# Patient Record
Sex: Female | Born: 1960 | Hispanic: Yes | Marital: Married | Smoking: Never smoker
Health system: Southern US, Community
[De-identification: ages and names within clinical notes are randomized; demographics above are authoritative.]

## PROBLEM LIST (undated history)

## (undated) DIAGNOSIS — C169 Malignant neoplasm of stomach, unspecified: Secondary | ICD-10-CM

## (undated) DIAGNOSIS — I2699 Other pulmonary embolism without acute cor pulmonale: Secondary | ICD-10-CM

## (undated) DIAGNOSIS — K5900 Constipation, unspecified: Secondary | ICD-10-CM

## (undated) DIAGNOSIS — C801 Malignant (primary) neoplasm, unspecified: Secondary | ICD-10-CM

## (undated) HISTORY — DX: Malignant (primary) neoplasm, unspecified: C80.1

---

## 2013-10-18 HISTORY — PX: ABDOMINAL HYSTERECTOMY: SHX81

## 2014-05-02 ENCOUNTER — Other Ambulatory Visit: Payer: Self-pay

## 2014-05-02 ENCOUNTER — Ambulatory Visit: Payer: Self-pay | Attending: Internal Medicine

## 2014-05-02 MED ORDER — ENOXAPARIN SODIUM 100 MG/ML ~~LOC~~ SOLN: 100.0000 mg | Freq: Two times a day (BID) | SUBCUTANEOUS | Status: DC

## 2014-05-02 NOTE — Progress Notes (Unsigned)
Patient recently moved here from [place] Does not have a primary doctor but is currently taking  Lovenox. Pharmacy spoke with Dr Annitta Needs and approved a one Month supply for patient.  Patient does have an appointment here to  Establish care.  [facility]

## 2014-05-06 ENCOUNTER — Ambulatory Visit: Payer: Self-pay | Attending: Internal Medicine

## 2014-05-07 ENCOUNTER — Other Ambulatory Visit: Payer: Self-pay

## 2014-05-07 MED ORDER — ENOXAPARIN SODIUM 100 MG/ML ~~LOC~~ SOLN: 100.0000 mg | Freq: Two times a day (BID) | SUBCUTANEOUS | Status: DC

## 2014-05-15 ENCOUNTER — Other Ambulatory Visit: Payer: Self-pay

## 2014-05-17 ENCOUNTER — Ambulatory Visit: Payer: Medicaid Other | Attending: Family Medicine | Admitting: Family Medicine

## 2014-05-17 ENCOUNTER — Encounter: Payer: Self-pay | Admitting: Family Medicine

## 2014-05-17 VITALS — BP 146/96 | HR 74 | Temp 98.4°F | Resp 18 | Ht 61.0 in | Wt 192.0 lb

## 2014-05-17 DIAGNOSIS — Z90710 Acquired absence of both cervix and uterus: Secondary | ICD-10-CM | POA: Diagnosis not present

## 2014-05-17 DIAGNOSIS — C179 Malignant neoplasm of small intestine, unspecified: Secondary | ICD-10-CM | POA: Diagnosis present

## 2014-05-17 DIAGNOSIS — F329 Major depressive disorder, single episode, unspecified: Secondary | ICD-10-CM | POA: Diagnosis not present

## 2014-05-17 DIAGNOSIS — C569 Malignant neoplasm of unspecified ovary: Secondary | ICD-10-CM | POA: Insufficient documentation

## 2014-05-17 DIAGNOSIS — I2699 Other pulmonary embolism without acute cor pulmonale: Secondary | ICD-10-CM

## 2014-05-17 DIAGNOSIS — Z9221 Personal history of antineoplastic chemotherapy: Secondary | ICD-10-CM | POA: Insufficient documentation

## 2014-05-17 DIAGNOSIS — Z86711 Personal history of pulmonary embolism: Secondary | ICD-10-CM | POA: Insufficient documentation

## 2014-05-17 DIAGNOSIS — R918 Other nonspecific abnormal finding of lung field: Secondary | ICD-10-CM

## 2014-05-17 DIAGNOSIS — Z7901 Long term (current) use of anticoagulants: Secondary | ICD-10-CM | POA: Diagnosis not present

## 2014-05-17 DIAGNOSIS — F32A Depression, unspecified: Secondary | ICD-10-CM | POA: Insufficient documentation

## 2014-05-17 HISTORY — DX: Other pulmonary embolism without acute cor pulmonale: I26.99

## 2014-05-17 LAB — COMPLETE METABOLIC PANEL WITH GFR
ALK PHOS: 147 U/L — AB (ref 39–117)
ALT: 15 U/L (ref 0–35)
AST: 20 U/L (ref 0–37)
Albumin: 4.3 g/dL (ref 3.5–5.2)
BILIRUBIN TOTAL: 0.7 mg/dL (ref 0.2–1.2)
BUN: 9 mg/dL (ref 6–23)
CO2: 26 mEq/L (ref 19–32)
Calcium: 9.2 mg/dL (ref 8.4–10.5)
Chloride: 104 mEq/L (ref 96–112)
Creat: 0.61 mg/dL (ref 0.50–1.10)
GFR, Est African American: >89 mL/min
GLUCOSE: 89 mg/dL (ref 70–99)
Potassium: 4.1 mEq/L (ref 3.5–5.3)
Sodium: 139 mEq/L (ref 135–145)
Total Protein: 7.2 g/dL (ref 6.0–8.3)

## 2014-05-17 LAB — CBC WITH DIFFERENTIAL/PLATELET
Basophils Absolute: 0 10*3/uL (ref 0.0–0.1)
Basophils Relative: 1 % (ref 0–1)
EOS ABS: 0 10*3/uL (ref 0.0–0.7)
Eosinophils Relative: 1 % (ref 0–5)
HCT: 32.7 % — ABNORMAL LOW (ref 36.0–46.0)
Hemoglobin: 10.5 g/dL — ABNORMAL LOW (ref 12.0–15.0)
Lymphocytes Relative: 41 % (ref 12–46)
Lymphs Abs: 1.5 10*3/uL (ref 0.7–4.0)
MCH: 30.8 pg (ref 26.0–34.0)
MCHC: 32.1 g/dL (ref 30.0–36.0)
MCV: 95.9 fL (ref 78.0–100.0)
MONOS PCT: 22 % — AB (ref 3–12)
Monocytes Absolute: 0.8 10*3/uL (ref 0.1–1.0)
NEUTROS PCT: 35 % — AB (ref 43–77)
Neutro Abs: 1.3 10*3/uL — ABNORMAL LOW (ref 1.7–7.7)
PLATELETS: 250 10*3/uL (ref 150–400)
RBC: 3.41 MIL/uL — ABNORMAL LOW (ref 3.87–5.11)
RDW: 17.9 % — AB (ref 11.5–15.5)
WBC: 3.7 10*3/uL — ABNORMAL LOW (ref 4.0–10.5)

## 2014-05-17 MED ORDER — RIVAROXABAN 20 MG PO TABS: 20.0000 mg | ORAL_TABLET | Freq: Every day | ORAL | Status: DC

## 2014-05-17 MED ORDER — SERTRALINE HCL 100 MG PO TABS: 100.0000 mg | ORAL_TABLET | Freq: Every day | ORAL | Status: DC

## 2014-05-17 MED ORDER — RIVAROXABAN 15 MG PO TABS: 15.0000 mg | ORAL_TABLET | Freq: Two times a day (BID) | ORAL | Status: DC

## 2014-05-17 NOTE — Assessment & Plan Note (Signed)
A: related to recent cancer diagnosis and treatment P: Continue zoloft

## 2014-05-17 NOTE — Assessment & Plan Note (Signed)
A: s/p chemotherapy. Medically stable.  P: Patient signed a release of medical information form so all records could be obtained Referral to oncology placed

## 2014-05-17 NOTE — Progress Notes (Signed)
   Subjective:    Patient ID: Alicia [name], female    DOB: 1960/09/23, 53 y.o.   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 She is s/p total abdominal hysterectomy and 12 sessions of chemotherapy with FOLFOX. She request referral to oncology to establish care. She comes today with some of her records (op report, pathology, first few f/u with her oncologist in [place] Dr. Raelyn Mora).   #2 PE: she was diagnosed with bilateral pulmonary emboli from a CT chest obtained after her last FOLFOX session. She has been giving herself lovenox injections twice daily since 04/29/14. She was suppose to have f/u with her oncologist to discuss transitioning to oral therapy but moved down to [place] before this appointment was completed. She admits occasional CP and SOB. No syncope.   #3 depression: related to recent cancer diagnosis and treatment. On zoloft. Tolerating the medication. Mood reported as fair.   Soc hx: non smoker  Review of Systems As per HPI  Intermittent severe headaches, she takes dilaudid sparingly, HAs were bad during chemo.    Objective:   Physical Exam BP 146/96  Pulse 74  Temp(Src) 98.4 F (36.9 C) (Oral)  Resp 18  Ht 5\' 1"  (1.549 m)  Wt 192 lb (87.091 kg)  BMI 36.30 kg/m2  SpO2 99% General appearance: alert, cooperative and no distress Lungs: clear to auscultation bilaterally Heart: regular rate and rhythm, S1, S2 normal, no murmur, click, rub or gallop Extremities: extremities normal, atraumatic, no cyanosis or edema     Assessment & Plan:

## 2014-05-17 NOTE — Assessment & Plan Note (Signed)
A: per history, patient on lovenox.  P: Transition off lovenox to xarelto 15 mg BID x 3 weeks, then 20 mg daily. Review records Check CMP and CBC Likely obtain f/u CTA

## 2014-05-17 NOTE — Patient Instructions (Addendum)
Senora Linde Pope,  Gracias por venido hoy.  I was very nice to meet you   1. Stop loveonx 2. Start xarelto 15 mg twice daily with food for 3 weeks, followed by 20 mg daily with food.  3. Refilled zoloft 4. I placed a referral to oncology  5. Sign release of medical information form so I can get records from Dr. Jacques Navy.   F/u in 4-6 weeks with me   Dr. Adrian Blackwater

## 2014-05-17 NOTE — Progress Notes (Signed)
Establish Care Stated has Hx intestine and stomach Cancer  Need oncology referral, Medicine Refill enoxaparin

## 2014-05-20 ENCOUNTER — Telehealth: Payer: Self-pay | Admitting: *Deleted

## 2014-05-20 NOTE — Telephone Encounter (Signed)
Message copied by Betti Cruz on Mon May 20, 2014  5:35 PM ------      Message from: Boykin Nearing      Created: Mon May 20, 2014 12:05 PM       Normocytic anemia.      Normal CMP except slightly elevated alk phos      No f/u labs at this time ------

## 2014-05-20 NOTE — Telephone Encounter (Signed)
Left message with normal lab results, call back for more information

## 2014-05-22 ENCOUNTER — Telehealth: Payer: Self-pay | Admitting: Emergency Medicine

## 2014-05-22 ENCOUNTER — Encounter (HOSPITAL_COMMUNITY): Payer: Self-pay | Admitting: Emergency Medicine

## 2014-05-22 ENCOUNTER — Telehealth: Payer: Self-pay | Admitting: Family Medicine

## 2014-05-22 ENCOUNTER — Ambulatory Visit: Payer: Self-pay | Attending: Internal Medicine

## 2014-05-22 ENCOUNTER — Emergency Department (INDEPENDENT_AMBULATORY_CARE_PROVIDER_SITE_OTHER)
Admission: EM | Admit: 2014-05-22 | Discharge: 2014-05-22 | Disposition: A | Discharge status: Home / Self Care | Payer: Self-pay | Source: Home / Self Care | Attending: Family Medicine | Admitting: Family Medicine

## 2014-05-22 DIAGNOSIS — T887XXA Unspecified adverse effect of drug or medicament, initial encounter: Secondary | ICD-10-CM

## 2014-05-22 DIAGNOSIS — T50905A Adverse effect of unspecified drugs, medicaments and biological substances, initial encounter: Secondary | ICD-10-CM

## 2014-05-22 DIAGNOSIS — I2699 Other pulmonary embolism without acute cor pulmonale: Secondary | ICD-10-CM

## 2014-05-22 MED ORDER — ENOXAPARIN SODIUM 40 MG/0.4ML ~~LOC~~ SOLN: 90.0000 mg | Freq: Two times a day (BID) | SUBCUTANEOUS | Status: DC

## 2014-05-22 NOTE — Discharge Instructions (Signed)
Pare de tomar Xarelto y comienze de nuevo Lovenox. Por favor counsulte con su doctora lo mas pronto posible. Gracias.

## 2014-05-22 NOTE — Telephone Encounter (Signed)
Pt brother Garlon Hatchet called in for pt with medication reaction due to [employer] pt is experiencing numbness in arms and feet with heart palpitations,vomitting. Instructed brother to take pt to Urgent Care or Er for evaluation/treatment and to stop medication now Message will be sent to Dr. Adrian Blackwater for further medication change

## 2014-05-22 NOTE — ED Notes (Signed)
Pt reports s/e to Xarelto; started to take them last Thursday Sx include: palpitations, anxiety, vomiting, HA Alert, no signs of acute distress.

## 2014-05-22 NOTE — ED Provider Notes (Signed)
Alicia [name] is a 53 y.o. female who presents to Urgent Care today for side effects to medication. Patient was switched from Lovenox to Xarelto 6 days ago. Since then she's had palpitations vomiting headache and fatigue. She was doing very well on Lovenox. She did not take Xarelto today and feels a bit better. No current fevers chills chest pain or palpitations. She takes anticoagulation due to history of PE with malignancy.   Past Medical History  Diagnosis Date  . Cancer Dx Mar 5,2015   History  Substance Use Topics  . Smoking status: Never Smoker   . Smokeless tobacco: Never Used  . Alcohol Use: No   ROS as above Medications: No current facility-administered medications for this encounter.   Current Outpatient Prescriptions  Medication Sig Dispense Refill  . HYDROmorphone (DILAUDID) 4 MG tablet Take by mouth every 4 (four) hours as needed for severe pain.      . pantoprazole (PROTONIX) 40 MG tablet Take 40 mg by mouth daily.      . prochlorperazine (COMPAZINE) 10 MG tablet Take 10 mg by mouth every 6 (six) hours as needed for nausea or vomiting.      . sertraline (ZOLOFT) 100 MG tablet Take 1 tablet (100 mg total) by mouth daily.  30 tablet  2  . [DISCONTINUED] rivaroxaban (XARELTO) 20 MG TABS tablet Take 1 tablet (20 mg total) by mouth daily with supper.  30 tablet  1  . enoxaparin (LOVENOX) 40 MG/0.4ML injection Inject 0.9 mLs (90 mg total) into the skin every 12 (twelve) hours.  60 Syringe  0    Exam:  BP 163/103  Pulse 82  Temp(Src) 98.3 F (36.8 C) (Oral)  Resp 18  SpO2 97% Gen: Well NAD HEENT: EOMI,  MMM Lungs: Normal work of breathing. CTABL Heart: RRR no MRG Abd: NABS, Soft. Nondistended, Nontender Exts: Brisk capillary refill, warm and well perfused.   No results found for this or any previous visit (from the past 24 hour(s)). No results found.  Assessment and Plan: 53 y.o. female with side effect and a possible allergy to Xarelto. Switch to Lovenox.  Followup with PCP. Prescription for Lovenox provided. 1mg /kg bid  Discussed warning signs or symptoms. Please see discharge instructions. Patient expresses understanding.     Gregor Hams, MD 05/22/14 2056

## 2014-05-22 NOTE — Telephone Encounter (Signed)
Patient has come in today to inform PCP that she is having a negative reaction to script Rivaroxaban (XARELTO) 15 MG TABS tablet; Patient states she feels like her heart is beating too fast accompanied with headaches, numbness in hands/feet and vomitting

## 2014-05-23 ENCOUNTER — Ambulatory Visit: Payer: Medicaid Other | Attending: Family Medicine | Admitting: Family Medicine

## 2014-05-23 ENCOUNTER — Encounter: Payer: Self-pay | Admitting: Family Medicine

## 2014-05-23 VITALS — BP 135/90 | HR 83 | Temp 98.2°F | Resp 18 | Ht 61.0 in | Wt 196.0 lb

## 2014-05-23 DIAGNOSIS — I2699 Other pulmonary embolism without acute cor pulmonale: Secondary | ICD-10-CM

## 2014-05-23 DIAGNOSIS — Z09 Encounter for follow-up examination after completed treatment for conditions other than malignant neoplasm: Secondary | ICD-10-CM | POA: Diagnosis not present

## 2014-05-23 DIAGNOSIS — C801 Malignant (primary) neoplasm, unspecified: Secondary | ICD-10-CM | POA: Diagnosis not present

## 2014-05-23 DIAGNOSIS — Z7901 Long term (current) use of anticoagulants: Secondary | ICD-10-CM

## 2014-05-23 DIAGNOSIS — T50995D Adverse effect of other drugs, medicaments and biological substances, subsequent encounter: Secondary | ICD-10-CM | POA: Insufficient documentation

## 2014-05-23 DIAGNOSIS — C179 Malignant neoplasm of small intestine, unspecified: Secondary | ICD-10-CM

## 2014-05-23 DIAGNOSIS — Z5181 Encounter for therapeutic drug level monitoring: Secondary | ICD-10-CM

## 2014-05-23 LAB — POCT INR: INR: 1

## 2014-05-23 MED ORDER — WARFARIN SODIUM 2 MG PO TABS: 6.0000 mg | ORAL_TABLET | Freq: Every day | ORAL | Status: DC

## 2014-05-23 MED ORDER — ENOXAPARIN SODIUM 100 MG/ML ~~LOC~~ SOLN: 1.0000 mg/kg | Freq: Two times a day (BID) | SUBCUTANEOUS | Status: DC

## 2014-05-23 NOTE — Progress Notes (Signed)
   Subjective:    Patient ID: Alicia [name], female    DOB: 1960/09/14, 53 y.o.   MRN: [id number] CC: f/u xarelto SE  HPI 53 yo F presents for f/u visit:  1. xarelto SE: had nause, emesis, HA, numbness in fingers and toes while taking xarelto. Stopped xarelto. Went to urgent care. Restarted lovenox injections. No longer having symptoms. Of note patient also take SSRI. No bleeding or bruising. On xarelto for PE in the setting of malignancy.  Soc hx: non smoker  Review of Systems As per HPI     Objective:   Physical Exam BP 135/90  Pulse 83  Temp(Src) 98.2 F (36.8 C) (Oral)  Resp 18  Ht 5\' 1"  (1.549 m)  Wt 196 lb (88.905 kg)  BMI 37.05 kg/m2  SpO2 98% General appearance: alert, cooperative and no distress Head: Normocephalic, without obvious abnormality, atraumatic Lungs: clear to auscultation bilaterally Heart: regular rate and rhythm, S1, S2 normal, no murmur, click, rub or gallop Extremities: extremities normal, atraumatic, no cyanosis or edema       Assessment & Plan:

## 2014-05-23 NOTE — Patient Instructions (Addendum)
Alicia [name],  Thank you for coming in today. No more xarelto.  Start coumadin 6 mg (3 tablets) at 6 PM.  Continue lovenox 9 AM and 9 PM while transitioning to coumadin.  You will take lovenox until your INR is 2-3 for 48 hrs at least.  Today your INR is 1.0.   Return on Monday 05/27/14 for coumadin clinic.    Dr. Colette Ribas: lo que debe saber (Warfarin: What You Need to Know) La warfarina es un anticoagulante. Los anticoagulantes ayudan a prevenir la formacin de cogulos de [place]. Tambin ayudan a Recruitment consultant de cogulos de [place]. A veces, se dice que la warfarina es un "anticoagulante".  Normalmente, cuando hay un corte o lesin en los tejidos, los cogulos impiden la prdida de [place]. A veces, se forman cogulos dentro de los vasos sanguneos que obstruyen el flujo sanguneo a travs del sistema circulatorio (trombosis). Estos cogulos pueden viajar por el torrente sanguneo y se alojan en los vasos sanguneos pequeos del cerebro, lo que puede causar un ictus, o en los pulmones (embolia pulmonar). Alicia [name] DEBEN UTILIZAR WARFARINA La warfarina se receta en aquellos pacientes que tienen riesgo de desarrollar cogulos sanguneos peligrosos:  Las personas que tienen una vlvula cardaca mecnica, un ritmo cardaco irregular llamado fibrilacin auricular o ciertos trastornos de la coagulacin.  Las personas que tuvieron cogulos sanguneos peligrosos en el pasado, incluidas las que tuvieron un ictus, una embolia pulmonar o trombosis en las piernas (trombosis venosa profunda [TVP]).  Las personas con un cogulo sanguneo existente, como una embolia pulmonar. DOSIS DE [place] comprimidos de warfarina vienen en diferentes concentraciones. Cada uno de ellos es de diferente color y tiene la cantidad de warfarina (en miligramos) claramente impresa en el comprimido. Si cuando le dan una nueva receta el color del comprimido es diferente al habitual, infrmelo  inmediatamente a su farmacutico o mdico. SUPERVISIN DE LA WARFARINA El objetivo del tratamiento con warfarina es disminuir la formacin de cogulos, pero no impedir completamente la coagulacin. Su mdico supervisar atentamente el efecto anticoagulante de la warfarina y ajustar la dosis segn sea necesario. Por su seguridad, se usan anlisis de sangre llamados tiempo de protrombina (PT) o ndice internacional normalizado (INR). Ambos anlisis pueden hacerse con un pinchazo en el dedo o con una extraccin de [place]. Cuanto ms tiempo necesite la sangre para Designer, fashion/clothing, mayor ser el nivel de [place] o de INR. Su mdico le informar cul es su rango de PT o INR "deseado". Si en cualquier momento su PT o INR estn por encima del rango deseado, existe riesgo de hemorragia. Si el nivel de PT o INR es menor que el rango deseado, existe riesgo de formar cogulos. Ya sea que comience a recibir Dealer est hospitalizado o en el consultorio de su mdico, deber controlarse el PT o INR dentro de la primera semana de Academic librarian. Inicialmente, a Psychologist, clinical se les indica que se controlen el PT o INR [employer] veces por semana. Una vez que se haya encontrado la dosis de mantenimiento, Lobbyist PT o INR con menos frecuencia, generalmente una vez cada dos a cuatro semanas. La dosis de warfarina podra ajustarse si el PT o INR no estn dentro del rango deseado. Es importante que cumpla con los controles de laboratorio y las consultas mdicas que le indiquen. No cumplir con las visitas puede resultar en una lesin, dolor o discapacidad crnica o permanente debido a que la warfarina es un medicamento que requiere Furniture conservator/restorer  estrecha vigilancia. CULES SON LOS EFECTOS ADVERSOS DE LA [place]?  Mucha cantidad de warfarina puede producir sangrado (hemorragia) en cualquier parte del cuerpo. Podra ser sangrado de las encas, sangre en la [place], materia fecal con sangre u oscura, hemorragia nasal que  no frena fcilmente, escupir sangre al toser o vomitar con [employer].  Muy poca cantidad de warfarina puede aumentar el riesgo de cogulos sanguneos.  Muy poca o mucha warfarina tambin puede aumentar el riesgo de ictus.  La warfarina puede causar una erupcin cutnea o irritacin, fiebre inusual, nuseas o Higher education careers adviser constantes o dolor intenso en las articulaciones o la espalda. PRECAUCIONES ESPECIALES MIENTRAS SE EST TOMANDO WARFARINA La warfarina debe ser tomada exactamente como se le indic. Es muy importante que reciba la warfarina segn las indicaciones, ya que podra tener tanto una hemorragia como cogulos sanguneos que terminen produciendo una lesin, Social research officer, government o discapacidad crnica o [place].  [employer] a la misma hora. Si olvida tomar una dosis, puede tomarla si an no pasaron 6horas del momento correspondiente.  No modifique la dosis de warfarina por sus propios medios para compensar la dosis que ha omitido.  Si omite ms de dos dosis consecutivas, comunquese con su mdico para que le d indicaciones. Evite las situaciones que le puedan producir hemorragias. Puede tener tendencia a sangrar ms fcilmente que lo habitual mientras toma warfarina. Las siguientes recomendaciones pueden limitar el sangrado:  Utilice un cepillo de dientes blando.  Utilice un hilo dental encerado.  Rasrese con Geographical information systems officer y no con hoja de Public relations account executive.  Limite el uso de objetos afilados.  Evite las actividades potencialmente peligrosas, como los deportes de [place]. Warfarina y Media planner o amamantamiento  No se aconseja el uso de warfarina durante Software engineer trimestre del embarazo debido al aumento de riesgo para el beb de tener defectos congnitos. En ciertas situaciones, una mujer puede tomar warfarina despus del primer trimestre del embarazo. Una mujer que queda embarazada o planifica estarlo mientras toma warfarina debe informarlo al mdico  inmediatamente.  Aunque la warfarina no pasa hacia la [place], una mujer que desea Counselling psychologist est tomando warfarina debe consultarlo con su mdico. Consumo de alcohol, tabaquismo y drogas ilegales   El alcohol afecta la forma en que la warfarina funciona en el organismo. Es Museum/gallery conservator las bebidas alcohlicas o consumir muy pequeas cantidades cuando se est en tratamiento con warfarina. En general, debe limitar el consumo de alcohol a 1onza (40JW) de licor, 6onzas (119JY) de vino o 12onzas (341ml) de cerveza por da. Hgale saber a su mdico si modifica su consumo de alcohol.  El tabaquismo afecta el funcionamiento de la warfarina. Lo ideal es evitar fumar cuando se est en tratamiento con warfarina. Hgale saber a su mdico si modifica su hbito de tabaquismo.  Lo ideal es evitar todas las drogas ilegales cuando se est en tratamiento con warfarina porque existen pocos estudios que muestren cmo interacta con estas drogas. Otros medicamentos y suplementos nutricionales Muchos medicamentos recetados y de venta [place] pueden interferir con la warfarina. Asegrese de informar a todos sus mdicos que est tomando warfarina. Notifique al mdico que le recet la warfarina o al farmacutico antes de comenzar o interrumpir cualquier otro medicamento, incluso las vitaminas, los suplementos dietarios y los medicamentos para el dolor de [place]. Es posible que deba ajustar la dosis de warfarina. Algunos medicamentos de [employer] pueden aumentar el riesgo de sangrado mientras est tomando warfarina son los siguientes:  Paracetamol.  Aspirina.  Antiinflamatorios no esteroides (AINE), como ibuprofeno o naproxeno.  Vitamina E. Consideraciones nutricionales Los alimentos con cantidades moderadas o altas de vitaminaK interfieren con la warfarina. Evite cambios importantes en su dieta, o hable con su mdico antes de cambiarla. Consuma una cantidad consistente de alimentos  con un nivel moderado o alto de vitaminaK. Consumir menos alimentos con vitaminaK puede aumentar el riesgo de [place]. Consumir ms de [employer] puede aumentar el riesgo de cogulos sanguneos. Consulte a un nutricionista si tiene otras preguntas sobre las consideraciones en la dieta. Alimentos que tienen un nivel muy alto de vitaminaK:  Los vegetales, como la acelga suiza y Music therapist, y las hojas de [place], [place] o nabo (frescos o congelados, cocidos).  Col rizada (fresca o congelada, cocida).  Perejil (crudo).  Espinaca (cocida). Alimentos que tienen un nivel alto de vitaminaK:  Investment banker, operational (frescos, cocidos).  Frijoles verdes (frescos, cocidos).  Brcoli.  Repollo chino (cocido).  Repollitos de [place] (frescos o congelados, cocidos).  Repollo (cocido).   Col. Alimentos que tienen un nivel moderado de vitaminaK:  Arndanos.  Frijoles carita.  Endivia (cruda).  Valeda Malm de hoja verde (cruda).  Cebollines verdes (crudos).  Col rizada (cruda).  Quimbomb (congelado, cocido).  Pltanos (fritos).  Priscille Loveless (cruda).  Chucrut (en lata).  Espinaca (cruda). LLAME A SU CLNICA O MDICO SI USTED:  Planifica someterse a una ciruga o un procedimiento.  Se siente enfermo, especialmente si tiene diarrea o vmitos.  Hace o planifica hacer cambios importantes en la dieta.  Comienza a tomar o interrumpe medicamentos recetados o de venta libre.  Thyra Breed, planifica quedar o cree estarlo.  Tiene perodos menstruales ms abundantes.  Sufre una cada, un accidente o tiene sntomas de sangrado o hematomas inusuales.  Tiene fiebre inusual. LLAME AL 911 EN LOS ESTADOS UNIDOS O CONCURRA AL SERVICIO DE EMERGENCIAS SI USTED:   Cree que tiene una reaccin alrgica a la warfarina. Los signos de [place] reaccin alrgica pueden ser picazn, erupcin, ronchas, hinchazn, opresin en el pecho o problemas para respirar.  Observa sangre en la orina. Los  signos pueden ser orina de color t, rojizo o rosado.  Observa sangre en las heces. Los signos pueden ser heces negras o rojo brillante.  Vomita o tose con sangre. En estos casos, la sangre podra ser de color rojo brillante o tener la apariencia de "caf molido".  Tiene sangrado que no se interrumpe despus de aplicar presin durante 70minutos, por ejemplo cortes, sangrado de la nariz u otras lesiones.  Tiene dolor intenso en las articulaciones o la espalda.  Tiene dolor de cabeza intenso.  Siente debilidad o adormecimiento sbito de la cara, el brazo o la pierna, especialmente en un lado del cuerpo.  Siente confusin o problemas para comprender sbitamente.  Tiene dificultad sbita en la visin de uno o ambos ojos.  Tiene problemas para caminar, mareos, prdida del equilibrio o de la coordinacin sbitamente.  Tiene dificultad para hablar o comprender (afasia). Document Released: 11/17/2006 Document Revised: 12/17/2013 [facility] Patient Information 2015 [place]. This information is not intended to replace advice given to you by your health care provider. Make sure you discuss any questions you have with your health care provider.

## 2014-05-23 NOTE — Assessment & Plan Note (Signed)
A: did not tolerate xarelto. P:  Start coumadin 6 mg (3 tablets) at 6 PM.  Continue lovenox 1mg /kg BID 9 AM and 9 PM while transitioning to coumadin.  You will take lovenox until your INR is 2-3 for 48 hrs at least.  Today your INR is 1.0.   Return on Monday 05/27/14 for coumadin clinic.

## 2014-05-23 NOTE — Progress Notes (Signed)
Pt complaining of medicine reaction, Xarelto,  vomiting, hand and finger numb.leg pain Pt stated din not have any reaction to the Injection

## 2014-05-27 ENCOUNTER — Ambulatory Visit: Payer: Medicaid Other | Attending: Family Medicine | Admitting: Pharmacist

## 2014-05-27 VITALS — BP 118/80 | HR 70 | Temp 97.8°F | Resp 16

## 2014-05-27 DIAGNOSIS — I8290 Acute embolism and thrombosis of unspecified vein: Secondary | ICD-10-CM | POA: Diagnosis not present

## 2014-05-27 DIAGNOSIS — Z7901 Long term (current) use of anticoagulants: Secondary | ICD-10-CM | POA: Insufficient documentation

## 2014-05-27 LAB — POCT INR: INR: 1.3

## 2014-05-27 NOTE — Progress Notes (Signed)
Patient is here for recheck on her INR Today INR is 1.3 Per Dr Adrian Blackwater  Increase coumadin to 8mg  and repeat INR this friday

## 2014-05-30 ENCOUNTER — Telehealth: Payer: Self-pay | Admitting: Oncology

## 2014-05-30 NOTE — Telephone Encounter (Signed)
S/W PATIENT FRIEND AND GAVE NP APPT FOR 10/22 @ 1:30 W/DR. MOHAMED.  REFERRING DR. Boykin Nearing DX- ADENOCARCINOMA OF SMALL BOWEL

## 2014-05-30 NOTE — Telephone Encounter (Signed)
C/D 05/30/14 for appt. 06/06/14

## 2014-05-31 ENCOUNTER — Ambulatory Visit: Payer: Self-pay | Attending: Family Medicine

## 2014-05-31 DIAGNOSIS — I8290 Acute embolism and thrombosis of unspecified vein: Secondary | ICD-10-CM

## 2014-05-31 LAB — POCT INR: INR: 2.8

## 2014-05-31 MED ORDER — WARFARIN SODIUM 4 MG PO TABS: 8.0000 mg | ORAL_TABLET | Freq: Every day | ORAL | Status: DC

## 2014-06-06 ENCOUNTER — Ambulatory Visit (HOSPITAL_BASED_OUTPATIENT_CLINIC_OR_DEPARTMENT_OTHER): Payer: Self-pay

## 2014-06-06 ENCOUNTER — Ambulatory Visit (HOSPITAL_BASED_OUTPATIENT_CLINIC_OR_DEPARTMENT_OTHER): Payer: Self-pay | Admitting: Oncology

## 2014-06-06 ENCOUNTER — Encounter: Payer: Self-pay | Admitting: Oncology

## 2014-06-06 ENCOUNTER — Ambulatory Visit: Payer: Self-pay

## 2014-06-06 VITALS — BP 135/96 | HR 84 | Temp 98.4°F | Resp 18 | Ht 60.0 in | Wt 194.7 lb

## 2014-06-06 DIAGNOSIS — C562 Malignant neoplasm of left ovary: Secondary | ICD-10-CM

## 2014-06-06 DIAGNOSIS — C5702 Malignant neoplasm of left fallopian tube: Secondary | ICD-10-CM

## 2014-06-06 DIAGNOSIS — I2699 Other pulmonary embolism without acute cor pulmonale: Secondary | ICD-10-CM

## 2014-06-06 DIAGNOSIS — C561 Malignant neoplasm of right ovary: Secondary | ICD-10-CM

## 2014-06-06 DIAGNOSIS — C166 Malignant neoplasm of greater curvature of stomach, unspecified: Secondary | ICD-10-CM

## 2014-06-06 DIAGNOSIS — C5701 Malignant neoplasm of right fallopian tube: Secondary | ICD-10-CM

## 2014-06-06 DIAGNOSIS — Z23 Encounter for immunization: Secondary | ICD-10-CM

## 2014-06-06 DIAGNOSIS — G629 Polyneuropathy, unspecified: Secondary | ICD-10-CM

## 2014-06-06 DIAGNOSIS — C169 Malignant neoplasm of stomach, unspecified: Secondary | ICD-10-CM

## 2014-06-06 HISTORY — DX: Malignant neoplasm of stomach, unspecified: C16.9

## 2014-06-06 MED ORDER — HEPARIN SOD (PORK) LOCK FLUSH 100 UNIT/ML IV SOLN
500.0000 [IU] | Freq: Once | INTRAVENOUS | Status: AC
  Administered 2014-06-06: 500 [IU] via INTRAVENOUS
  Filled 2014-06-06: qty 5

## 2014-06-06 MED ORDER — SODIUM CHLORIDE 0.9 % IJ SOLN
10.0000 mL | INTRAMUSCULAR | Status: DC | PRN
  Administered 2014-06-06: 10 mL via INTRAVENOUS
  Filled 2014-06-06: qty 10

## 2014-06-06 MED ORDER — INFLUENZA VAC SPLIT QUAD 0.5 ML IM SUSY
0.5000 mL | PREFILLED_SYRINGE | Freq: Once | INTRAMUSCULAR | Status: AC
  Administered 2014-06-06: 0.5 mL via INTRAMUSCULAR

## 2014-06-06 NOTE — Progress Notes (Signed)
Checked in new pt no insurance.  Pt was approved for 100% discount thru [facility] until 11/04/14.  Forward to [facility] for follow up.

## 2014-06-06 NOTE — Progress Notes (Signed)
[facility] Patient Consult   Referring MD: Minerva Ends, Md [address] [place], [place] [address]   Debrah Granderson 53 y.o.  [birth year]    Reason for Referral: Gastric cancer   HPI: (She was with a Spanish interpreter today) She presented with abdominal pain and underwent surgery 10/18/2013. She was found to have ovarian cancer in the initial surgery included a total abdominal hysterectomy, resection of metastatic tumor involving the anterior abdominal wall, bilateral pelvic lymphadenectomy, omentectomy, and small bowel resection. Tumor was noted to involve the greater curvature of the stomach and there was a tumor implant at the rectal surface. The pathology confirmed moderate to poorly differentiated adenocarcinoma involving both ovaries and fallopian tubes. An omental mass was involved by adenocarcinoma. A right pelvic nodule contained poorly different it adenocarcinoma. The segment of small bowel was involved with moderate to poorly differentiated adenocarcinoma as was tumor at the umbilicus. Immunohistochemical stains family tumor cells to be positive for pancytokeratin,CDX2, cytokeratin 7, and CA 19-9. The tumor cells were negative for CA 125, gross cystic disease fluid protein, TTF-1, PAX8, cytokeratin 20, and inhibin.   She was referred to gastroenterology to look for a primary tumor source. A stomach biopsy 11/28/2013 revealed adenocarcinoma (we do not have the endoscopy report available today)  She saw Dr.Kula at [facility] She was started on treatment with FOLFOX chemotherapy. She has completed 12 cycles of FOLFOX to date. The most recent cycle was initiated 04/25/2014. She reports tolerating the chemotherapy well other than the neuropathy symptoms. She reports the neuropathy started after cycle 10 and is now interfering with activities. There is numbness in the hands and feet.  A restaging CT 02/04/2014 revealed a decrease  in the size of a few lymph nodes in the upper abdomen.. At the midline anterior, wall there was a slight increase in ill-defined densities felt to represent scarring versus neoplasm. Otherwise no new mass or adenopathy. A restaging CT 04/29/2014 confirmed new pulmonary emboli, a stable gastric mass with adjacent lymph nodes. No new lymphadenopathy. A faint areas of groundglass nodularity in the lungs was felt to be nonspecific.  Ms. Stenseth has relocated to [facility]. She was maintained on Lovenox anticoagulation for the pulmonary emboli and is now taking Coumadin.   Past Medical History  Diagnosis Date  .  metastatic gastric cancer  Dx Mar 5,2015     .  G3 P3    .  Pulmonary emboli on a chest CT 04/29/2014  Past Surgical History  Procedure Laterality Date  . Abdominal hysterectomy, bilateral oophorectomy, bilateral pelvic lymphadenectomy, omentectomy, resection of tumor at the anterior wall, pelvis, and periumbilical region   11/15/3242     .   Pilonidal cyst surgery  Medications: Reviewed  Allergies:  Allergies  Allergen Reactions  . Xarelto [Rivaroxaban] Anaphylaxis, Nausea And Vomiting and Palpitations    Headache and fatigue     Family history: She has 6 siblings. No family history of cancer.  Social History:   She lives with her husband and daughter in [place]. She is disabled. No tobacco or alcohol use. No transfusion history. No risk factor for HIV or hepatitis.  History  Alcohol Use No    History  Smoking status  . Never Smoker   Smokeless tobacco  . Never Used      ROS:   Positives include: One episode of vomiting 06/05/2014, numbness in the hands and feet  A complete ROS was otherwise negative.  Physical Exam:  Blood  pressure 135/96, pulse 84, temperature 98.4 F (36.9 C), temperature source Oral, resp. rate 18, height 5' (1.524 m), weight 194 lb 11.2 oz (88.315 kg), SpO2 100.00%.  HEENT: Oropharynx without visible mass, neck without mass, no  thrush or ulcers Lungs: Clear bilaterally Cardiac: Regular rate and rhythm Abdomen: No hepatomegaly, nontender, no mass  Vascular: No leg edema Lymph nodes: No cervical, supraclavicular, axillary, or inguinal nodes Neurologic: Alert and oriented, the motor exam appears intact in the upper and lower extremities. Moderate to Severe decrease in vibratory sense at the fingertips bilaterally and in left toes. Moderate decrease in vibratory sense at the right great toe. Skin: Resolving ecchymoses/hematoma is at the level, wall Musculoskeletal: No spine tenderness   LAB:  CBC  Lab Results  Component Value Date   WBC 3.7* 05/17/2014   HGB 10.5* 05/17/2014   HCT 32.7* 05/17/2014   MCV 95.9 05/17/2014   PLT 250 05/17/2014   NEUTROABS 1.3* 05/17/2014     CMP      Component Value Date/Time   NA 139 05/17/2014 1515   K 4.1 05/17/2014 1515   CL 104 05/17/2014 1515   CO2 26 05/17/2014 1515   GLUCOSE 89 05/17/2014 1515   BUN 9 05/17/2014 1515   CREATININE 0.61 05/17/2014 1515   CALCIUM 9.2 05/17/2014 1515   PROT 7.2 05/17/2014 1515   ALBUMIN 4.3 05/17/2014 1515   AST 20 05/17/2014 1515   ALT 15 05/17/2014 1515   ALKPHOS 147* 05/17/2014 1515   BILITOT 0.7 05/17/2014 1515   GFRNONAA >89 05/17/2014 1515   GFRAA >89 05/17/2014 1515    Imaging:  As per history of present illness   Assessment/Plan:   1. Stage IV gastric cancer  Status post bilateral oophorectomy, total abdominal hysterectomy, omentectomy, resection of abdominal wall and pelvic masses, bilateral pelvic lymphadenectomy 10/18/2013 with the pathology confirming moderate to poorly differentiated adenocarcinoma  Stomach biopsy 11/28/2013 revealed adenocarcinoma  Status post 12 cycles of FOLFOX, last given 04/25/2014  Restaging CT 04/29/2014 with thickening of the stomach and nodular densities adjacent to the greater curvature-unchanged, faint groundglass nodularity in the lungs-nonspecific  2.   oxaliplatin neuropathy  3.   bilateral  upper and lower lobe pulmonary emboli within segmental branches-now maintained on Coumadin   Disposition:   She was diagnosed with metastatic gastric cancer in the spring of 2015. Ms. Vallo has completed 12 cycles of FOLFOX. She has oxaliplatin neuropathy. There is no clinical or CT evidence of progressive disease. Chemotherapy will be discontinued at this point secondary to the neuropathy and stable disease. No therapy will be curative. We will consider salvage chemotherapy options when there is disease progression.  We flushed the Port-A-Cath today and obtained a baseline CEA. She received an influenza vaccine.  I will request the endoscopy report from [place]. We will also ask for the tumor to be tested for HER-2/neu.  She will return for an office visit and Port-A-Cath flush in 6 weeks. We will see her in the interim as needed. The Coumadin anticoagulation is being managed at the [facility].  [place], [place] 06/06/2014, 2:31 PM

## 2014-06-07 ENCOUNTER — Telehealth: Payer: Self-pay | Admitting: Oncology

## 2014-06-07 ENCOUNTER — Ambulatory Visit: Payer: Self-pay | Attending: Family Medicine | Admitting: Pharmacist

## 2014-06-07 ENCOUNTER — Encounter: Payer: Self-pay | Admitting: *Deleted

## 2014-06-07 DIAGNOSIS — I8290 Acute embolism and thrombosis of unspecified vein: Secondary | ICD-10-CM

## 2014-06-07 LAB — COMPREHENSIVE METABOLIC PANEL (CC13)
ALT: 15 U/L (ref 0–55)
ANION GAP: 9 meq/L (ref 3–11)
AST: 16 U/L (ref 5–34)
Albumin: 3.6 g/dL (ref 3.5–5.0)
Alkaline Phosphatase: 145 U/L (ref 40–150)
BILIRUBIN TOTAL: 0.57 mg/dL (ref 0.20–1.20)
BUN: 10.6 mg/dL (ref 7.0–26.0)
CO2: 24 mEq/L (ref 22–29)
CREATININE: 0.7 mg/dL (ref 0.6–1.1)
Calcium: 9.6 mg/dL (ref 8.4–10.4)
Chloride: 110 mEq/L — ABNORMAL HIGH (ref 98–109)
GLUCOSE: 108 mg/dL (ref 70–140)
Potassium: 4 mEq/L (ref 3.5–5.1)
Sodium: 143 mEq/L (ref 136–145)
Total Protein: 7.4 g/dL (ref 6.4–8.3)

## 2014-06-07 LAB — CBC WITH DIFFERENTIAL/PLATELET
BASO%: 0.4 % (ref 0.0–2.0)
BASOS ABS: 0 10*3/uL (ref 0.0–0.1)
EOS%: 1.5 % (ref 0.0–7.0)
Eosinophils Absolute: 0.1 10*3/uL (ref 0.0–0.5)
HCT: 32.4 % — ABNORMAL LOW (ref 34.8–46.6)
HEMOGLOBIN: 10.3 g/dL — AB (ref 11.6–15.9)
LYMPH%: 30.9 % (ref 14.0–49.7)
MCH: 30.6 pg (ref 25.1–34.0)
MCHC: 31.8 g/dL (ref 31.5–36.0)
MCV: 96.1 fL (ref 79.5–101.0)
MONO#: 0.7 10*3/uL (ref 0.1–0.9)
MONO%: 13.1 % (ref 0.0–14.0)
NEUT#: 2.8 10*3/uL (ref 1.5–6.5)
NEUT%: 54.1 % (ref 38.4–76.8)
Platelets: 273 10*3/uL (ref 145–400)
RBC: 3.37 10*6/uL — ABNORMAL LOW (ref 3.70–5.45)
RDW: 16 % — AB (ref 11.2–14.5)
WBC: 5.3 10*3/uL (ref 3.9–10.3)
lymph#: 1.6 10*3/uL (ref 0.9–3.3)
nRBC: 0 % (ref 0–0)

## 2014-06-07 LAB — CEA: CEA: 1.5 ng/mL (ref 0.0–5.0)

## 2014-06-07 LAB — POCT INR: INR: 2.6

## 2014-06-07 NOTE — Progress Notes (Signed)
[place] Work  Clinical Social Work was referred by interpreter, Lavon Paganini for assessment of psychosocial needs.  Clinical Social Worker met with patient and interpreter briefly to offer support and assess for needs.  Pt recently moved to [place] from [place]. CSW explained role of CSW and provided information re Patient and [place]. CSW also discussed resources to assist. Pt provided with contact info for CSW team and agrees to seek assistance as needed.   Clinical Social Work interventions: Resource education  Loren Racer, [place] Worker [place]  [place] Phone: [phone] Fax: [phone]

## 2014-06-07 NOTE — Telephone Encounter (Signed)
gv adn printd appt sched and av sfo rpt for DEC

## 2014-06-12 ENCOUNTER — Telehealth: Payer: Self-pay | Admitting: Oncology

## 2014-06-12 NOTE — Telephone Encounter (Signed)
OFFICE NOTES FAXED TO DR. KULA @ [phone]

## 2014-06-21 ENCOUNTER — Ambulatory Visit: Payer: Self-pay

## 2014-06-21 ENCOUNTER — Ambulatory Visit: Payer: Medicaid Other | Attending: Family Medicine | Admitting: Pharmacist

## 2014-06-21 DIAGNOSIS — I8290 Acute embolism and thrombosis of unspecified vein: Secondary | ICD-10-CM | POA: Diagnosis not present

## 2014-06-21 LAB — POCT INR: INR: 3.8

## 2014-06-21 NOTE — Patient Instructions (Signed)
Do not take any coumadin tonight Take 6 mg tues, Thursday and Saturday Take 8 mg Monday, Wednesday,friday and Sunday Return in one wee to recheck INR

## 2014-06-28 ENCOUNTER — Encounter: Payer: Self-pay | Admitting: Pharmacist

## 2014-07-01 ENCOUNTER — Ambulatory Visit: Payer: Medicaid Other | Attending: Family Medicine | Admitting: Pharmacist

## 2014-07-01 DIAGNOSIS — I2699 Other pulmonary embolism without acute cor pulmonale: Secondary | ICD-10-CM | POA: Diagnosis not present

## 2014-07-01 DIAGNOSIS — Z7901 Long term (current) use of anticoagulants: Secondary | ICD-10-CM

## 2014-07-01 LAB — POCT INR: INR: 2

## 2014-07-08 ENCOUNTER — Ambulatory Visit: Payer: Medicaid Other | Attending: Family Medicine | Admitting: Pharmacist

## 2014-07-08 DIAGNOSIS — Z5181 Encounter for therapeutic drug level monitoring: Secondary | ICD-10-CM | POA: Diagnosis not present

## 2014-07-08 DIAGNOSIS — Z7901 Long term (current) use of anticoagulants: Secondary | ICD-10-CM | POA: Diagnosis not present

## 2014-07-08 DIAGNOSIS — I2699 Other pulmonary embolism without acute cor pulmonale: Secondary | ICD-10-CM | POA: Diagnosis not present

## 2014-07-08 LAB — POCT INR: INR: 2.2

## 2014-07-08 MED ORDER — WARFARIN SODIUM 4 MG PO TABS: 8.0000 mg | ORAL_TABLET | Freq: Every day | ORAL | Status: DC

## 2014-07-08 NOTE — Patient Instructions (Signed)
Take 2 tablets everyday and return next Monday

## 2014-07-10 ENCOUNTER — Encounter (HOSPITAL_COMMUNITY): Payer: Self-pay | Admitting: Emergency Medicine

## 2014-07-10 ENCOUNTER — Emergency Department (HOSPITAL_COMMUNITY): Payer: Medicaid Other

## 2014-07-10 ENCOUNTER — Emergency Department (HOSPITAL_COMMUNITY)
Admission: EM | Admit: 2014-07-10 | Discharge: 2014-07-10 | Disposition: A | Discharge status: Home / Self Care | Payer: Medicaid Other | Attending: Emergency Medicine | Admitting: Emergency Medicine

## 2014-07-10 DIAGNOSIS — C801 Malignant (primary) neoplasm, unspecified: Secondary | ICD-10-CM | POA: Insufficient documentation

## 2014-07-10 DIAGNOSIS — R52 Pain, unspecified: Secondary | ICD-10-CM

## 2014-07-10 DIAGNOSIS — C7889 Secondary malignant neoplasm of other digestive organs: Secondary | ICD-10-CM | POA: Diagnosis not present

## 2014-07-10 DIAGNOSIS — Z90710 Acquired absence of both cervix and uterus: Secondary | ICD-10-CM | POA: Diagnosis not present

## 2014-07-10 DIAGNOSIS — S3991XA Unspecified injury of abdomen, initial encounter: Secondary | ICD-10-CM | POA: Diagnosis present

## 2014-07-10 DIAGNOSIS — W01198A Fall on same level from slipping, tripping and stumbling with subsequent striking against other object, initial encounter: Secondary | ICD-10-CM | POA: Diagnosis not present

## 2014-07-10 DIAGNOSIS — S1989XA Other specified injuries of other specified part of neck, initial encounter: Secondary | ICD-10-CM | POA: Diagnosis not present

## 2014-07-10 DIAGNOSIS — Y998 Other external cause status: Secondary | ICD-10-CM | POA: Insufficient documentation

## 2014-07-10 DIAGNOSIS — S59912A Unspecified injury of left forearm, initial encounter: Secondary | ICD-10-CM | POA: Insufficient documentation

## 2014-07-10 DIAGNOSIS — Y92012 Bathroom of single-family (private) house as the place of occurrence of the external cause: Secondary | ICD-10-CM | POA: Diagnosis not present

## 2014-07-10 DIAGNOSIS — Y9389 Activity, other specified: Secondary | ICD-10-CM | POA: Diagnosis not present

## 2014-07-10 DIAGNOSIS — S4992XA Unspecified injury of left shoulder and upper arm, initial encounter: Secondary | ICD-10-CM | POA: Diagnosis not present

## 2014-07-10 DIAGNOSIS — Z79899 Other long term (current) drug therapy: Secondary | ICD-10-CM | POA: Insufficient documentation

## 2014-07-10 DIAGNOSIS — W19XXXA Unspecified fall, initial encounter: Secondary | ICD-10-CM

## 2014-07-10 DIAGNOSIS — R1013 Epigastric pain: Secondary | ICD-10-CM

## 2014-07-10 DIAGNOSIS — C799 Secondary malignant neoplasm of unspecified site: Secondary | ICD-10-CM

## 2014-07-10 LAB — URINALYSIS, ROUTINE W REFLEX MICROSCOPIC
Bilirubin Urine: NEGATIVE
GLUCOSE, UA: NEGATIVE mg/dL
Hgb urine dipstick: NEGATIVE
KETONES UR: NEGATIVE mg/dL
Leukocytes, UA: NEGATIVE
Nitrite: NEGATIVE
PROTEIN: NEGATIVE mg/dL
Specific Gravity, Urine: 1.009 (ref 1.005–1.030)
Urobilinogen, UA: 1 mg/dL (ref 0.0–1.0)
pH: 7 (ref 5.0–8.0)

## 2014-07-10 LAB — COMPREHENSIVE METABOLIC PANEL
ALBUMIN: 3.3 g/dL — AB (ref 3.5–5.2)
ALT: 8 U/L (ref 0–35)
AST: 13 U/L (ref 0–37)
Alkaline Phosphatase: 117 U/L (ref 39–117)
Anion gap: 11 (ref 5–15)
BUN: 10 mg/dL (ref 6–23)
CO2: 26 mEq/L (ref 19–32)
CREATININE: 0.59 mg/dL (ref 0.50–1.10)
Calcium: 8.9 mg/dL (ref 8.4–10.5)
Chloride: 104 mEq/L (ref 96–112)
GFR calc Af Amer: >90 mL/min (ref >90)
GFR calc non Af Amer: >90 mL/min (ref >90)
Glucose, Bld: 99 mg/dL (ref 70–99)
POTASSIUM: 3.8 meq/L (ref 3.7–5.3)
Sodium: 141 mEq/L (ref 137–147)
Total Bilirubin: 0.5 mg/dL (ref 0.3–1.2)
Total Protein: 6.7 g/dL (ref 6.0–8.3)

## 2014-07-10 LAB — CBC WITH DIFFERENTIAL/PLATELET
BASOS ABS: 0 10*3/uL (ref 0.0–0.1)
BASOS PCT: 1 % (ref 0–1)
Eosinophils Absolute: 0.1 10*3/uL (ref 0.0–0.7)
Eosinophils Relative: 2 % (ref 0–5)
HCT: 29 % — ABNORMAL LOW (ref 36.0–46.0)
Hemoglobin: 9.4 g/dL — ABNORMAL LOW (ref 12.0–15.0)
Lymphocytes Relative: 29 % (ref 12–46)
Lymphs Abs: 1.3 10*3/uL (ref 0.7–4.0)
MCH: 30.5 pg (ref 26.0–34.0)
MCHC: 32.4 g/dL (ref 30.0–36.0)
MCV: 94.2 fL (ref 78.0–100.0)
MONO ABS: 0.5 10*3/uL (ref 0.1–1.0)
Monocytes Relative: 12 % (ref 3–12)
NEUTROS ABS: 2.5 10*3/uL (ref 1.7–7.7)
NEUTROS PCT: 56 % (ref 43–77)
Platelets: 226 10*3/uL (ref 150–400)
RBC: 3.08 MIL/uL — ABNORMAL LOW (ref 3.87–5.11)
RDW: 15 % (ref 11.5–15.5)
WBC: 4.4 10*3/uL (ref 4.0–10.5)

## 2014-07-10 LAB — PROTIME-INR
INR: 2.9 — ABNORMAL HIGH (ref 0.00–1.49)
Prothrombin Time: 30.5 seconds — ABNORMAL HIGH (ref 11.6–15.2)

## 2014-07-10 LAB — LIPASE, BLOOD: Lipase: 15 U/L (ref 11–59)

## 2014-07-10 MED ORDER — HYDROMORPHONE HCL 1 MG/ML IJ SOLN
1.0000 mg | INTRAMUSCULAR | Status: DC | PRN
  Administered 2014-07-10 (×2): 1 mg via INTRAVENOUS
  Filled 2014-07-10 (×2): qty 1

## 2014-07-10 MED ORDER — HEPARIN SOD (PORK) LOCK FLUSH 100 UNIT/ML IV SOLN
500.0000 [IU] | Freq: Once | INTRAVENOUS | Status: AC
  Administered 2014-07-10: 500 [IU]
  Filled 2014-07-10: qty 5

## 2014-07-10 MED ORDER — SODIUM CHLORIDE 0.9 % IV SOLN
1000.0000 mL | INTRAVENOUS | Status: DC
  Administered 2014-07-10: 1000 mL via INTRAVENOUS

## 2014-07-10 MED ORDER — ONDANSETRON HCL 4 MG/2ML IJ SOLN
4.0000 mg | Freq: Once | INTRAMUSCULAR | Status: AC
  Administered 2014-07-10: 4 mg via INTRAVENOUS
  Filled 2014-07-10: qty 2

## 2014-07-10 MED ORDER — OXYCODONE HCL 5 MG PO TABS: 5.0000 mg | ORAL_TABLET | ORAL | Status: DC | PRN

## 2014-07-10 MED ORDER — SODIUM CHLORIDE 0.9 % IV SOLN
1000.0000 mL | Freq: Once | INTRAVENOUS | Status: AC
  Administered 2014-07-10: 1000 mL via INTRAVENOUS

## 2014-07-10 MED ORDER — ONDANSETRON HCL 4 MG PO TABS: 4.0000 mg | ORAL_TABLET | Freq: Four times a day (QID) | ORAL | Status: DC

## 2014-07-10 NOTE — ED Notes (Signed)
Pt has stomach cancer.  Pt has been getting her tx in [place] but is continuing them here at cancer.  Has an appt dec 4.  Pt c/o abd pain and dizziness.  States that she slipped in the bathroom about an hour ago and now has elbow and lt sided head pain.  Denies LOC.  Not on any thinners.

## 2014-07-10 NOTE — Discharge Instructions (Signed)
Dolor abdominal (Abdominal Pain) El dolor puede tener muchas causas. Normalmente la causa del dolor abdominal no es una enfermedad y Teacher, English as a foreign language sin Clinical research associate. Frecuentemente puede controlarse y tratarse en casa. Su mdico le Chartered certified accountant examen fsico y posiblemente solicite anlisis de sangre y radiografas para ayudar a Teacher, adult education la gravedad de su dolor. Sin embargo, en [employer], debe transcurrir ms tiempo antes de que se pueda Pension scheme manager una causa evidente del dolor. Antes de llegar a ese punto, es posible que su mdico no sepa si necesita ms pruebas o un tratamiento ms profundo. INSTRUCCIONES PARA EL CUIDADO EN EL HOGAR  Est atento al dolor para ver si hay cambios. Las siguientes indicaciones ayudarn a Chief Strategy Officer que pueda sentir:  [place] solo medicamentos de venta libre o recetados, segn las indicaciones del mdico.  No tome laxantes a menos que se lo haya indicado su mdico.  Pruebe con Ardelia Mems dieta lquida absoluta (caldo, t o agua) segn se lo indique su mdico. Introduzca gradualmente una dieta normal, segn su tolerancia. SOLICITE ATENCIN MDICA SI:  Tiene dolor abdominal sin explicacin.  Tiene dolor abdominal relacionado con nuseas o diarrea.  Tiene dolor cuando orina o defeca.  Experimenta dolor abdominal que lo despierta de noche.  Tiene dolor abdominal que empeora o mejora cuando come alimentos.  Tiene dolor abdominal que empeora cuando come alimentos grasosos.  Tiene fiebre. SOLICITE ATENCIN MDICA DE INMEDIATO SI:   El dolor no desaparece en un plazo mximo de 2horas.  No deja de (vomitar).  El Social research officer, government se siente solo en partes del abdomen, como el lado derecho o la parte inferior izquierda del abdomen.  Evaca materia fecal sanguinolenta o negra, de aspecto alquitranado. ASEGRESE DE QUE:  Comprende estas instrucciones.  Controlar su afeccin.  Recibir ayuda de inmediato si no mejora o si empeora. Document Released: 08/02/2005  Document Revised: 08/07/2013 [facility] Patient Information 2015 [place]. This information is not intended to replace advice given to you by your health care provider. Make sure you discuss any questions you have with your health care provider.

## 2014-07-10 NOTE — ED Provider Notes (Signed)
CSN: [id number]     Arrival date & time 07/10/14  1354 History   First MD Initiated Contact with Patient 07/10/14 1413     Chief Complaint  Patient presents with  . Abdominal Pain   HPI Patient presents to the emergency room because of weakness resulting in a fall associated with her trouble with chronic issues with abdominal pain and vomiting related to a history of metastatic gastric cancer. Patient has history of metastatic cancer. She was treated in [place] and underwent 12 cycles of chemotherapy. She has had surgery that involved resection of tumor and a TAH (there was ovarian tumor involvement).  She has been seen at the [place] and wellness center as well as the oncology center here.  She has not been eating or drinking well over the last several weeks. Earlier today she felt lightheaded and had a fall injuring her head and left arm.  She has pain in those areas. Past Medical History  Diagnosis Date  . 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 Abdominal hysterectomy  10/18/2013    Family History  Problem Relation Age of Onset  . Heart disease Mother   . Diabetes Sister   . Heart disease Sister   . Heart disease Brother    History  Substance Use Topics  . Smoking status: Never Smoker   . Smokeless tobacco: Never Used  . Alcohol Use: No   OB History    No data available     Review of Systems  All other systems reviewed and are negative.     Allergies  Xarelto  Home Medications   Prior to Admission medications   Medication Sig Start Date End Date Taking? Authorizing Provider  HYDROmorphone (DILAUDID) 4 MG tablet Take 4 mg by mouth every 4 (four) hours as needed for moderate pain or severe pain.   Yes Historical Provider, MD  pantoprazole (PROTONIX) 40 MG tablet Take 40 mg by mouth daily.   Yes Historical Provider, MD  prochlorperazine (COMPAZINE) 10 MG tablet Take 10 mg by mouth every 6 (six)  hours as needed for nausea or vomiting.   Yes Historical Provider, MD  sertraline (ZOLOFT) 100 MG tablet Take 1 tablet (100 mg total) by mouth daily. Patient taking differently: Take 100 mg by mouth daily as needed (depression).  05/17/14  Yes Josalyn C Funches, MD  warfarin (COUMADIN) 4 MG tablet Take 2 tablets (8 mg total) by mouth daily. 07/08/14  Yes Josalyn C Funches, MD   BP 153/94 mmHg  Pulse 81  Temp(Src) 98.3 F (36.8 C) (Oral)  Resp 18  SpO2 99% Physical Exam  Constitutional: She appears well-developed and well-nourished. No distress.  HENT:  Head: Normocephalic and atraumatic.  Right Ear: External ear normal.  Left Ear: External ear normal.  Eyes: Conjunctivae are normal. Right eye exhibits no discharge. Left eye exhibits no discharge. No scleral icterus.  Neck: Neck supple. No tracheal deviation present.  Cardiovascular: Normal rate, regular rhythm and intact distal pulses.   Pulmonary/Chest: Effort normal and breath sounds normal. No stridor. No respiratory distress. She has no wheezes. She has no rales.  Abdominal: Soft. Bowel sounds are normal. She exhibits no distension and no mass. There is generalized tenderness. There is no rebound and no guarding. No hernia.  Musculoskeletal: She exhibits no edema.       Cervical back: She exhibits tenderness.       Thoracic back:  She exhibits no tenderness and no bony tenderness.       Lumbar back: She exhibits no tenderness and no bony tenderness.       Left upper arm: She exhibits tenderness and bony tenderness. She exhibits no swelling and no edema.       Left forearm: She exhibits tenderness and bony tenderness. She exhibits no swelling and no deformity.  Neurological: She is alert. She has normal strength. No cranial nerve deficit (no facial droop, extraocular movements intact, no slurred speech) or sensory deficit. She exhibits normal muscle tone. She displays no seizure activity. Coordination normal.  Skin: Skin is warm and  dry. No rash noted.  Psychiatric: She has a normal mood and affect.  Nursing note and vitals reviewed.   ED Course  Procedures (including critical care time) Labs Review Labs Reviewed  CBC WITH DIFFERENTIAL - Abnormal; Notable for the following:    RBC 3.08 (*)    Hemoglobin 9.4 (*)    HCT 29.0 (*)    All other components within normal limits  PROTIME-INR - Abnormal; Notable for the following:    Prothrombin Time 30.5 (*)    INR 2.90 (*)    All other components within normal limits  COMPREHENSIVE METABOLIC PANEL  LIPASE, BLOOD  URINALYSIS, ROUTINE W REFLEX MICROSCOPIC  CBC WITH DIFFERENTIAL  URINALYSIS, ROUTINE W REFLEX MICROSCOPIC    Imaging Review Dg Forearm Left  07/10/2014   CLINICAL DATA:  Slipped and fell in bathroom. Left forearm injury and pain. Initial encounter.  EXAM: LEFT FOREARM - 2 VIEW  COMPARISON:  None.  FINDINGS: There is no evidence of fracture or other focal bone lesions. Soft tissues are unremarkable.  IMPRESSION: Negative.   Electronically Signed   By: Earle Gell M.D.   On: 07/10/2014 15:45   Ct Head Wo Contrast  07/10/2014   CLINICAL DATA:  Recent fall in bathroom with left-sided headache, denies loss of consciousness, neck pain, initial encounter  EXAM: CT HEAD WITHOUT CONTRAST  CT CERVICAL SPINE WITHOUT CONTRAST  TECHNIQUE: Multidetector CT imaging of the head and cervical spine was performed following the standard protocol without intravenous contrast. Multiplanar CT image reconstructions of the cervical spine were also generated.  COMPARISON:  None.  FINDINGS: CT HEAD FINDINGS  The bony calvarium is intact. No acute hemorrhage, acute infarction or space-occupying mass lesion is noted.  CT CERVICAL SPINE FINDINGS  Seven cervical segments are well visualized. Vertebral body height is well maintained. Mild straightening of the normal cervical lordosis is noted which may be related to muscular spasm. No definitive fracture or acute facet abnormality is seen.   The surrounding soft tissues show evidence of a right-sided chest wall port. Some patchy changes are noted in the posterior aspect of the right upper lobe on the last image. These are incompletely evaluated.  IMPRESSION: CT of the head:  No acute intracranial abnormality.  CT of the cervical spine: No acute fracture is noted. Mild straightening of the normal cervical lordosis is seen.  Patchy changes in the posterior aspect of the right upper lobe of uncertain significance. There incompletely evaluated on this exam.   Electronically Signed   By: Inez Catalina M.D.   On: 07/10/2014 15:56   Ct Cervical Spine Wo Contrast  07/10/2014   CLINICAL DATA:  Recent fall in bathroom with left-sided headache, denies loss of consciousness, neck pain, initial encounter  EXAM: CT HEAD WITHOUT CONTRAST  CT CERVICAL SPINE WITHOUT CONTRAST  TECHNIQUE: Multidetector CT imaging of the  head and cervical spine was performed following the standard protocol without intravenous contrast. Multiplanar CT image reconstructions of the cervical spine were also generated.  COMPARISON:  None.  FINDINGS: CT HEAD FINDINGS  The bony calvarium is intact. No acute hemorrhage, acute infarction or space-occupying mass lesion is noted.  CT CERVICAL SPINE FINDINGS  Seven cervical segments are well visualized. Vertebral body height is well maintained. Mild straightening of the normal cervical lordosis is noted which may be related to muscular spasm. No definitive fracture or acute facet abnormality is seen.  The surrounding soft tissues show evidence of a right-sided chest wall port. Some patchy changes are noted in the posterior aspect of the right upper lobe on the last image. These are incompletely evaluated.  IMPRESSION: CT of the head:  No acute intracranial abnormality.  CT of the cervical spine: No acute fracture is noted. Mild straightening of the normal cervical lordosis is seen.  Patchy changes in the posterior aspect of the right upper lobe of  uncertain significance. There incompletely evaluated on this exam.   Electronically Signed   By: Inez Catalina M.D.   On: 07/10/2014 15:56   Dg Abd Acute W/chest  07/10/2014   CLINICAL DATA:  Abdominal pain. Patient with history of stomach carcinoma. Nausea and vomiting.  EXAM: ACUTE ABDOMEN SERIES (ABDOMEN 2 VIEW & CHEST 1 VIEW)  COMPARISON:  None.  FINDINGS: PA chest: Lungs are clear. Heart size and pulmonary vascularity are normal. No adenopathy. Port-A-Cath tip is in the superior vena cava. No pneumothorax.  Supine and upright abdomen: There is fairly diffuse stool throughout colon. Bowel gas pattern overall is unremarkable without obstruction. No free air. There are multiple surgical clips in the pelvis.  IMPRESSION: Fairly diffuse stool throughout colon. Overall bowel gas pattern unremarkable. Lungs clear.   Electronically Signed   By: Lowella Grip M.D.   On: 07/10/2014 15:46   Dg Humerus Left  07/10/2014   CLINICAL DATA:  Gastric cancer. Abdominal pain and dizziness. Fall 1 hr ago in shower. Elbow pain.  EXAM: LEFT HUMERUS - 2+ VIEW  COMPARISON:  None.  FINDINGS: Midshaft deformity of the humerus compatible with alta healed fracture. Spurring of the humeral head.  No acute humeral fracture is currently identified.  IMPRESSION: 1. No acute bony findings. Old healed midshaft fracture of the humerus. 2. Degenerative spurring of the humeral head.   Electronically Signed   By: Sherryl Barters M.D.   On: 07/10/2014 15:45    Medications  0.9 %  sodium chloride infusion (1,000 mLs Intravenous New Bag/Given 07/10/14 1516)    Followed by  0.9 %  sodium chloride infusion (not administered)  HYDROmorphone (DILAUDID) injection 1 mg (not administered)  ondansetron (ZOFRAN) injection 4 mg (4 mg Intravenous Given 07/10/14 1517)     MDM   Pt with history of metastatic disease.  Followed by [facility] wellness and oncology here now.  Plan on IV fluids and pain meds..  I suspect her fall is  related to dehydration from her poor po intake.  Will check labs, give pain meds.  Initial xrays without fracture or significant injury.  Dorie Rank, MD 07/10/14 [phone]

## 2014-07-15 ENCOUNTER — Encounter: Payer: Self-pay | Admitting: Pharmacist

## 2014-07-15 ENCOUNTER — Ambulatory Visit: Payer: Medicaid Other | Attending: Family Medicine | Admitting: Pharmacist

## 2014-07-15 DIAGNOSIS — I8290 Acute embolism and thrombosis of unspecified vein: Secondary | ICD-10-CM | POA: Diagnosis not present

## 2014-07-15 LAB — POCT INR: INR: 4.5

## 2014-07-19 ENCOUNTER — Ambulatory Visit (HOSPITAL_BASED_OUTPATIENT_CLINIC_OR_DEPARTMENT_OTHER): Payer: Self-pay | Admitting: Nurse Practitioner

## 2014-07-19 ENCOUNTER — Telehealth: Payer: Self-pay | Admitting: Oncology

## 2014-07-19 ENCOUNTER — Ambulatory Visit (HOSPITAL_BASED_OUTPATIENT_CLINIC_OR_DEPARTMENT_OTHER): Payer: Self-pay

## 2014-07-19 VITALS — BP 156/93 | HR 80 | Temp 98.1°F | Resp 18 | Ht 60.0 in | Wt 192.3 lb

## 2014-07-19 DIAGNOSIS — C166 Malignant neoplasm of greater curvature of stomach, unspecified: Secondary | ICD-10-CM

## 2014-07-19 DIAGNOSIS — I2699 Other pulmonary embolism without acute cor pulmonale: Secondary | ICD-10-CM

## 2014-07-19 DIAGNOSIS — C169 Malignant neoplasm of stomach, unspecified: Secondary | ICD-10-CM

## 2014-07-19 DIAGNOSIS — Z7901 Long term (current) use of anticoagulants: Secondary | ICD-10-CM

## 2014-07-19 DIAGNOSIS — G620 Drug-induced polyneuropathy: Secondary | ICD-10-CM

## 2014-07-19 DIAGNOSIS — Z95828 Presence of other vascular implants and grafts: Secondary | ICD-10-CM

## 2014-07-19 LAB — CBC WITH DIFFERENTIAL/PLATELET
BASO%: 0.4 % (ref 0.0–2.0)
Basophils Absolute: 0 10*3/uL (ref 0.0–0.1)
EOS ABS: 0.1 10*3/uL (ref 0.0–0.5)
EOS%: 0.9 % (ref 0.0–7.0)
HCT: 32.1 % — ABNORMAL LOW (ref 34.8–46.6)
HGB: 10.1 g/dL — ABNORMAL LOW (ref 11.6–15.9)
LYMPH#: 1.5 10*3/uL (ref 0.9–3.3)
LYMPH%: 27 % (ref 14.0–49.7)
MCH: 29.7 pg (ref 25.1–34.0)
MCHC: 31.5 g/dL (ref 31.5–36.0)
MCV: 94.4 fL (ref 79.5–101.0)
MONO#: 0.5 10*3/uL (ref 0.1–0.9)
MONO%: 9.2 % (ref 0.0–14.0)
NEUT#: 3.5 10*3/uL (ref 1.5–6.5)
NEUT%: 62.5 % (ref 38.4–76.8)
NRBC: 0 % (ref 0–0)
PLATELETS: 258 10*3/uL (ref 145–400)
RBC: 3.4 10*6/uL — AB (ref 3.70–5.45)
RDW: 15 % — ABNORMAL HIGH (ref 11.2–14.5)
WBC: 5.6 10*3/uL (ref 3.9–10.3)

## 2014-07-19 LAB — COMPREHENSIVE METABOLIC PANEL (CC13)
ALBUMIN: 3.8 g/dL (ref 3.5–5.0)
ALT: 11 U/L (ref 0–55)
AST: 14 U/L (ref 5–34)
Alkaline Phosphatase: 126 U/L (ref 40–150)
Anion Gap: 11 mEq/L (ref 3–11)
BUN: 13.1 mg/dL (ref 7.0–26.0)
CALCIUM: 9.2 mg/dL (ref 8.4–10.4)
CHLORIDE: 105 meq/L (ref 98–109)
CO2: 25 mEq/L (ref 22–29)
CREATININE: 0.6 mg/dL (ref 0.6–1.1)
EGFR: >90 mL/min/{1.73_m2} (ref >90)
Glucose: 86 mg/dl (ref 70–140)
Potassium: 3.6 mEq/L (ref 3.5–5.1)
Sodium: 141 mEq/L (ref 136–145)
Total Bilirubin: 0.84 mg/dL (ref 0.20–1.20)
Total Protein: 7 g/dL (ref 6.4–8.3)

## 2014-07-19 LAB — PROTIME-INR
INR: 1.9 — ABNORMAL LOW (ref 2.00–3.50)
Protime: 22.8 Seconds — ABNORMAL HIGH (ref 10.6–13.4)

## 2014-07-19 MED ORDER — HEPARIN SOD (PORK) LOCK FLUSH 100 UNIT/ML IV SOLN
500.0000 [IU] | Freq: Once | INTRAVENOUS | Status: AC
  Administered 2014-07-19: 500 [IU] via INTRAVENOUS
  Filled 2014-07-19: qty 5

## 2014-07-19 MED ORDER — SODIUM CHLORIDE 0.9 % IJ SOLN
10.0000 mL | INTRAMUSCULAR | Status: DC | PRN
  Administered 2014-07-19: 10 mL via INTRAVENOUS
  Filled 2014-07-19: qty 10

## 2014-07-19 NOTE — Progress Notes (Signed)
  [facility] OFFICE PROGRESS NOTE   Diagnosis:  Gastric cancer  INTERVAL HISTORY:   She returns as scheduled. She reports a 3 week history of generalized abdominal pain. The pain is present fairly constantly. Dilaudid provides partial relief. She is having intermittent nausea/vomiting. She notes poor tolerance of solids. She is tolerating some liquids. Last bowel movement was 3 days ago. Bowels move with the aid of a laxative. She recently noted a "lump" at the umbilicus.  Objective:  Vital signs in last 24 hours:  Blood pressure 156/93, pulse 80, temperature 98.1 F (36.7 C), temperature source Oral, resp. rate 18, height 5' (1.524 m), weight 192 lb 4.8 oz (87.227 kg).    HEENT: no thrush or ulcers. Mucous membranes are moist. Resp: lungs clear bilaterally. Cardio: regular rate and rhythm. GI: abdomen is soft. Marked tenderness superior and just to the left of the midline abdominal scar with question corresponding underlying abdominal mass. Left side of the umbilicus is indurated and tender. Vascular: no leg edema. Port-A-Cath without erythema.  Lab Results:  Lab Results  Component Value Date   WBC 4.4 07/10/2014   HGB 9.4* 07/10/2014   HCT 29.0* 07/10/2014   MCV 94.2 07/10/2014   PLT 226 07/10/2014   NEUTROABS 2.5 07/10/2014    Imaging:  No results found.  Medications: I have reviewed the patient's current medications.  Assessment/Plan: 1. Stage IV gastric cancer  Status post bilateral oophorectomy, total abdominal hysterectomy, omentectomy, resection of abdominal wall and pelvic masses, bilateral pelvic lymphadenectomy 10/18/2013 with the pathology confirming moderate to poorly differentiated adenocarcinoma  Stomach biopsy 11/28/2013 revealed adenocarcinoma  Status post 12 cycles of FOLFOX, last given 04/25/2014  Restaging CT 04/29/2014 with thickening of the stomach and nodular densities adjacent to the greater curvature-unchanged, faint  groundglass nodularity in the lungs-nonspecific  2. oxaliplatin neuropathy  3. bilateral upper and lower lobe pulmonary emboli within segmental branches-now maintained on Coumadin   Disposition: Ms. Imhof presents today with a 3 week history of increased abdominal pain and intermittent nausea/vomiting. We will obtain baseline labs today to include a CBC and chemistry panel and refer her for restaging CT scans. She will continue Dilaudid as needed for the pain. She will return for a followup visit on 07/23/2014 to review the results.  Plan discussed with Dr. Benay Spice.  Ned Card ANP/GNP-BC   07/19/2014  3:29 PM

## 2014-07-19 NOTE — Patient Instructions (Signed)

## 2014-07-19 NOTE — Telephone Encounter (Signed)
gv and printed appt sched and avs for pt for Dec...gv pt barium °

## 2014-07-22 ENCOUNTER — Ambulatory Visit (HOSPITAL_COMMUNITY)
Admission: RE | Admit: 2014-07-22 | Discharge: 2014-07-22 | Disposition: A | Discharge status: Home / Self Care | Payer: Medicaid Other | Source: Ambulatory Visit | Attending: Nurse Practitioner | Admitting: Nurse Practitioner

## 2014-07-22 ENCOUNTER — Ambulatory Visit: Payer: Medicaid Other | Attending: Family Medicine | Admitting: Pharmacist

## 2014-07-22 ENCOUNTER — Encounter (HOSPITAL_COMMUNITY): Payer: Self-pay

## 2014-07-22 DIAGNOSIS — I2699 Other pulmonary embolism without acute cor pulmonale: Secondary | ICD-10-CM | POA: Diagnosis not present

## 2014-07-22 DIAGNOSIS — Z9221 Personal history of antineoplastic chemotherapy: Secondary | ICD-10-CM | POA: Diagnosis not present

## 2014-07-22 DIAGNOSIS — C169 Malignant neoplasm of stomach, unspecified: Secondary | ICD-10-CM | POA: Insufficient documentation

## 2014-07-22 DIAGNOSIS — Z7901 Long term (current) use of anticoagulants: Secondary | ICD-10-CM

## 2014-07-22 LAB — POCT INR: INR: 2.9

## 2014-07-22 MED ORDER — IOHEXOL 300 MG/ML  SOLN
100.0000 mL | Freq: Once | INTRAMUSCULAR | Status: AC | PRN
  Administered 2014-07-22: 100 mL via INTRAVENOUS

## 2014-07-23 ENCOUNTER — Telehealth: Payer: Self-pay | Admitting: Oncology

## 2014-07-23 ENCOUNTER — Ambulatory Visit (HOSPITAL_BASED_OUTPATIENT_CLINIC_OR_DEPARTMENT_OTHER): Payer: Self-pay | Admitting: Nurse Practitioner

## 2014-07-23 VITALS — BP 124/81 | HR 85 | Temp 98.5°F | Resp 18 | Ht 60.0 in | Wt 192.6 lb

## 2014-07-23 DIAGNOSIS — C166 Malignant neoplasm of greater curvature of stomach, unspecified: Secondary | ICD-10-CM

## 2014-07-23 DIAGNOSIS — G620 Drug-induced polyneuropathy: Secondary | ICD-10-CM

## 2014-07-23 DIAGNOSIS — Z7901 Long term (current) use of anticoagulants: Secondary | ICD-10-CM

## 2014-07-23 DIAGNOSIS — R1033 Periumbilical pain: Secondary | ICD-10-CM

## 2014-07-23 DIAGNOSIS — C169 Malignant neoplasm of stomach, unspecified: Secondary | ICD-10-CM

## 2014-07-23 DIAGNOSIS — R112 Nausea with vomiting, unspecified: Secondary | ICD-10-CM

## 2014-07-23 DIAGNOSIS — I2699 Other pulmonary embolism without acute cor pulmonale: Secondary | ICD-10-CM

## 2014-07-23 NOTE — Telephone Encounter (Signed)
gv adn printed appt sched and avs for pt for Dec and Jan 2016....sed added tx...emailed MW to add tx.

## 2014-07-23 NOTE — Progress Notes (Signed)
Called Jearld Pies, Nutritionist for supplements for patient. Ensure and boost given to pt.

## 2014-07-23 NOTE — Progress Notes (Addendum)
[facility] OFFICE PROGRESS NOTE   Diagnosis:  Gastric cancer  INTERVAL HISTORY:   Ms. Rother returns as scheduled. She continues to have abdominal pain mainly around the umbilicus. She is taking oxycodone as needed with partial relief. She continues to have intermittent nausea/vomiting mainly with solids. She is tolerating liquids without difficulty. Bowels are moving. She has persistent numbness in the fingertips and toes.  Objective:  Vital signs in last 24 hours:  Blood pressure 124/81, pulse 85, temperature 98.5 F (36.9 C), temperature source Oral, resp. rate 18, height 5' (1.524 m), weight 192 lb 9.6 oz (87.363 kg), SpO2 99 %.    HEENT: no thrush or ulcers. Resp: lungs clear bilaterally. Cardio: regular rate and rhythm. GI: abdomen is soft. Left side of the umbilicus is tender, indurated. Palpable mass with associated tenderness superior to the midline abdominal scar. Vascular: no leg edema. Port-A-Cath without erythema.  Lab Results:  Lab Results  Component Value Date   WBC 5.6 07/19/2014   HGB 10.1* 07/19/2014   HCT 32.1* 07/19/2014   MCV 94.4 07/19/2014   PLT 258 07/19/2014   NEUTROABS 3.5 07/19/2014    Imaging:  Ct Chest W Contrast  07/23/2014   CLINICAL DATA:  Restaging gastric cancer. History of peritoneal carcinomatosis. Status post chemotherapy.  EXAM: CT CHEST, ABDOMEN, AND PELVIS WITH CONTRAST  TECHNIQUE: Multidetector CT imaging of the chest, abdomen and pelvis was performed following the standard protocol during bolus administration of intravenous contrast.  CONTRAST:  144m OMNIPAQUE IOHEXOL 300 MG/ML  SOLN  COMPARISON:  No prior studies available for comparison.  FINDINGS: CT CHEST FINDINGS  Chest wall: A right-sided Port-A-Cath is noted. No breast masses, supraclavicular or axillary lymphadenopathy. The thyroid gland appears normal. The bony thorax is intact. No destructive bone lesions or spinal canal compromise.  Mediastinum: The  heart is borderline enlarged. No pericardial effusion. No mediastinal or hilar mass or lymphadenopathy. The aorta is normal in caliber. No dissection. The esophagus is grossly normal. A small pericardial cyst is noted on the right side.  Lungs: Moderate breathing motion artifact is noted. 13 mm irregular density in the right upper lobe could be an area of scarring or atelectasis. Comparison with prior CTs would be helpful if available. Otherwise, I would recommend a short-term followup chest CT (3 months) to re-evaluate. There is a 5 mm nodule in the left upper lobe on image number 23 which based on the sagittal reformatted images is most likely subpleural atelectasis but follow-up is recommended. No other worrisome pulmonary lesions. No pleural effusion.  CT ABDOMEN AND PELVIS FINDINGS  Hepatobiliary: Diffuse fatty infiltration of the liver but no focal hepatic lesions or evidence of peritoneal surface disease. No biliary dilatation. No common bowel duct dilatation.  Pancreas: Normal.  Spleen: Normal.  Adrenals/Urinary Tract: Normal.  Stomach/Bowel: The stomach is not well distended with contrast material. Gastric wall thickening in the body region may be due to lack of distention or infiltrating tumor. The duodenum, small bowel and colon are unremarkable. No inflammatory changes, mass lesions or obstructive findings.  Vascular/Lymphatic: The aorta and branch vessels are patent. The major venous structures are patent. Small scattered mesenteric and retroperitoneal lymph nodes but no mass or adenopathy. There are numerous omental lesions involving the upper abdomen. An index lesion in the midline anteriorly on image number 56 measures 12 mm. There is also a 12 mm nodule near the stomach on image number 49. There is a 2.7 cm soft tissue mass adjacent to the  plaque flexure of the colon on image number 60. Marked soft tissue thickening along the midline incision near the emboli kiss. This could be scarring change but  is somewhat worrisome for tumor seeding. There is a more discrete soft tissue mass in the midline of the abdomen in the region of the linea alba which measures a maximum of 3.3 cm.  Reproductive: The uterus and ovaries are surgically absent. The bladder appears normal. No pelvic mass, lymphadenopathy or inguinal adenopathy. Small soft tissue implant suspected on image number 87 measuring 18 mm.  Other: No free abdominal/ pelvic fluid or air.  Musculoskeletal: No significant bony findings. Sclerotic lesion in the left humeral head is likely a benign process.  IMPRESSION: 1. Indeterminate lung nodules as discussed above. Recommend correlation with any prior examinations or short-term followup CT scan. 2. Moderate wall thickening in the body region of the stomach likely reflecting the patient's gastric cancer. 3. Fairly extensive omental disease and findings suspicious for abdominal wall and incisional disease. Correlation with prior studies is suggested. 4. No solid organ metastatic disease is identified.   Electronically Signed   By: Kalman Jewels M.D.   On: 07/23/2014 10:30   Ct Abdomen Pelvis W Contrast  07/23/2014   CLINICAL DATA:  Restaging gastric cancer. History of peritoneal carcinomatosis. Status post chemotherapy.  EXAM: CT CHEST, ABDOMEN, AND PELVIS WITH CONTRAST  TECHNIQUE: Multidetector CT imaging of the chest, abdomen and pelvis was performed following the standard protocol during bolus administration of intravenous contrast.  CONTRAST:  111m OMNIPAQUE IOHEXOL 300 MG/ML  SOLN  COMPARISON:  No prior studies available for comparison.  FINDINGS: CT CHEST FINDINGS  Chest wall: A right-sided Port-A-Cath is noted. No breast masses, supraclavicular or axillary lymphadenopathy. The thyroid gland appears normal. The bony thorax is intact. No destructive bone lesions or spinal canal compromise.  Mediastinum: The heart is borderline enlarged. No pericardial effusion. No mediastinal or hilar mass or  lymphadenopathy. The aorta is normal in caliber. No dissection. The esophagus is grossly normal. A small pericardial cyst is noted on the right side.  Lungs: Moderate breathing motion artifact is noted. 13 mm irregular density in the right upper lobe could be an area of scarring or atelectasis. Comparison with prior CTs would be helpful if available. Otherwise, I would recommend a short-term followup chest CT (3 months) to re-evaluate. There is a 5 mm nodule in the left upper lobe on image number 23 which based on the sagittal reformatted images is most likely subpleural atelectasis but follow-up is recommended. No other worrisome pulmonary lesions. No pleural effusion.  CT ABDOMEN AND PELVIS FINDINGS  Hepatobiliary: Diffuse fatty infiltration of the liver but no focal hepatic lesions or evidence of peritoneal surface disease. No biliary dilatation. No common bowel duct dilatation.  Pancreas: Normal.  Spleen: Normal.  Adrenals/Urinary Tract: Normal.  Stomach/Bowel: The stomach is not well distended with contrast material. Gastric wall thickening in the body region may be due to lack of distention or infiltrating tumor. The duodenum, small bowel and colon are unremarkable. No inflammatory changes, mass lesions or obstructive findings.  Vascular/Lymphatic: The aorta and branch vessels are patent. The major venous structures are patent. Small scattered mesenteric and retroperitoneal lymph nodes but no mass or adenopathy. There are numerous omental lesions involving the upper abdomen. An index lesion in the midline anteriorly on image number 56 measures 12 mm. There is also a 12 mm nodule near the stomach on image number 49. There is a 2.7 cm soft tissue  mass adjacent to the plaque flexure of the colon on image number 60. Marked soft tissue thickening along the midline incision near the emboli kiss. This could be scarring change but is somewhat worrisome for tumor seeding. There is a more discrete soft tissue mass in  the midline of the abdomen in the region of the linea alba which measures a maximum of 3.3 cm.  Reproductive: The uterus and ovaries are surgically absent. The bladder appears normal. No pelvic mass, lymphadenopathy or inguinal adenopathy. Small soft tissue implant suspected on image number 87 measuring 18 mm.  Other: No free abdominal/ pelvic fluid or air.  Musculoskeletal: No significant bony findings. Sclerotic lesion in the left humeral head is likely a benign process.  IMPRESSION: 1. Indeterminate lung nodules as discussed above. Recommend correlation with any prior examinations or short-term followup CT scan. 2. Moderate wall thickening in the body region of the stomach likely reflecting the patient's gastric cancer. 3. Fairly extensive omental disease and findings suspicious for abdominal wall and incisional disease. Correlation with prior studies is suggested. 4. No solid organ metastatic disease is identified.   Electronically Signed   By: Kalman Jewels M.D.   On: 07/23/2014 10:30    Medications: I have reviewed the patient's current medications.  Assessment/Plan: 1. Stage IV gastric cancer  Status post bilateral oophorectomy, total abdominal hysterectomy, omentectomy, resection of abdominal wall and pelvic masses, bilateral pelvic lymphadenectomy 10/18/2013 with the pathology confirming moderate to poorly differentiated adenocarcinoma  Stomach biopsy 11/28/2013 revealed adenocarcinoma  Status post 12 cycles of FOLFOX, last given 04/25/2014  Restaging CT 04/29/2014 with thickening of the stomach and nodular densities adjacent to the greater curvature-unchanged, faint groundglass nodularity in the lungs-nonspecific  Restaging CT 07/22/2014 with indeterminate lung nodules; moderate wall thickening in the body region of the stomach; fairly extensive omental disease and findings suspicious for abdominal wall and incisional disease.  2. Oxaliplatin neuropathy  3. Bilateral upper and  lower lobe pulmonary emboli within segmental branches-now maintained on Coumadin   Disposition: Ms. Lizana has progressive metastatic gastric cancer. She is symptomatic with pain as well as intermittent nausea/vomiting with solids. Dr. Benay Spice reviewed the CT scan result/images with her and recommends systemic therapy with FOLFIRI. We reviewed potential toxicities including mouth sores, nausea/vomiting, diarrhea, myelosuppression, hair loss. She will attend a chemotherapy education class. She will return to begin the first cycle on 07/29/2014.   We will obtain a baseline CEA on 07/29/2014. We will also request the tumor be tested for HER-2/neu.  She will continue oxycodone for pain. She understands to contact the office with poor pain control.  We will see her in followup prior to cycle 2 on 08/12/2014. She will contact the office in the interim with any problems.  Patient seen with Dr. Benay Spice. 25 minutes were spent face-to-face at today's visit with the majority of the time involved in counseling/coordination of care.  Ned Card ANP/GNP-BC   07/23/2014  3:04 PM  This was a shared visit with Ned Card. Ms. Fomby was interviewed and examined.  There is clinical and CT evidence of disease progression. We reviewed the CT images with her. I recommend second line chemotherapy with FOLFIRI. We will request HER-2/neu testing on the pathology from [place]. We reviewed the potential toxicities associated with the FOLFIRI regimen and she agrees to proceed.  Julieanne Manson, M.D.

## 2014-07-24 ENCOUNTER — Telehealth: Payer: Self-pay | Admitting: *Deleted

## 2014-07-24 ENCOUNTER — Telehealth: Payer: Self-pay | Admitting: Oncology

## 2014-07-24 NOTE — Telephone Encounter (Signed)
Faxed request for tumor to be tested for HER-2/neu to pathology at [facility] fax # [phone]

## 2014-07-24 NOTE — Telephone Encounter (Signed)
-----  Message from Owens Shark, NP sent at 07/23/2014  4:01 PM EST ----- Please contact her previous oncologists office in [place] to see if the tumor was tested for HER-2/neu. If not please request they send testing and forward [place] for the result ASAP.

## 2014-07-24 NOTE — Telephone Encounter (Signed)
Per staff message and POF I have scheduled appts. Advised scheduler of appts and no available on mondays for treatment moved to Tuesdays. JMW

## 2014-07-24 NOTE — Telephone Encounter (Signed)
adjust lab date....pt will get new sched on 12.10.15

## 2014-07-25 ENCOUNTER — Telehealth: Payer: Self-pay | Admitting: Oncology

## 2014-07-25 ENCOUNTER — Other Ambulatory Visit: Payer: Self-pay

## 2014-07-25 NOTE — Telephone Encounter (Signed)
s.wAlmyra Free and she will advised pt on chemo edu appt on 12.14.15

## 2014-07-25 NOTE — Telephone Encounter (Signed)
s.wAlmyra [name] to advised pt on time change...Marland Kitchenper Alicia [name] she was not able to contact pt

## 2014-07-26 ENCOUNTER — Other Ambulatory Visit: Payer: Self-pay | Admitting: Oncology

## 2014-07-29 ENCOUNTER — Other Ambulatory Visit: Payer: Self-pay

## 2014-07-29 ENCOUNTER — Ambulatory Visit: Payer: Medicaid Other | Attending: Family Medicine | Admitting: Pharmacist

## 2014-07-29 DIAGNOSIS — I2699 Other pulmonary embolism without acute cor pulmonale: Secondary | ICD-10-CM | POA: Insufficient documentation

## 2014-07-29 DIAGNOSIS — Z7901 Long term (current) use of anticoagulants: Secondary | ICD-10-CM

## 2014-07-29 LAB — POCT INR: INR: 5

## 2014-07-29 NOTE — Progress Notes (Signed)
Pt INR 5.0 today States she has been taking scheduled dose as prescribed Denies bleeding or bruising Spanish interpretor present for communication Instructions given to hold dose tonight and tomorrow and we will attempt to draw INR @ home Wednesday Pt will be receiving chemo tomorrow with n/v side effects

## 2014-07-30 ENCOUNTER — Other Ambulatory Visit: Payer: Self-pay | Admitting: Nurse Practitioner

## 2014-07-30 ENCOUNTER — Other Ambulatory Visit (HOSPITAL_BASED_OUTPATIENT_CLINIC_OR_DEPARTMENT_OTHER): Payer: Self-pay

## 2014-07-30 ENCOUNTER — Ambulatory Visit (HOSPITAL_BASED_OUTPATIENT_CLINIC_OR_DEPARTMENT_OTHER): Payer: Self-pay

## 2014-07-30 DIAGNOSIS — C169 Malignant neoplasm of stomach, unspecified: Secondary | ICD-10-CM

## 2014-07-30 DIAGNOSIS — C166 Malignant neoplasm of greater curvature of stomach, unspecified: Secondary | ICD-10-CM

## 2014-07-30 DIAGNOSIS — I2699 Other pulmonary embolism without acute cor pulmonale: Secondary | ICD-10-CM

## 2014-07-30 DIAGNOSIS — Z7901 Long term (current) use of anticoagulants: Secondary | ICD-10-CM

## 2014-07-30 DIAGNOSIS — Z5111 Encounter for antineoplastic chemotherapy: Secondary | ICD-10-CM

## 2014-07-30 LAB — COMPREHENSIVE METABOLIC PANEL (CC13)
ALT: 11 U/L (ref 0–55)
AST: 14 U/L (ref 5–34)
Albumin: 3.7 g/dL (ref 3.5–5.0)
Alkaline Phosphatase: 125 U/L (ref 40–150)
Anion Gap: 11 mEq/L (ref 3–11)
BILIRUBIN TOTAL: 0.77 mg/dL (ref 0.20–1.20)
BUN: 9.9 mg/dL (ref 7.0–26.0)
CO2: 27 mEq/L (ref 22–29)
Calcium: 9.2 mg/dL (ref 8.4–10.4)
Chloride: 105 mEq/L (ref 98–109)
Creatinine: 0.7 mg/dL (ref 0.6–1.1)
GLUCOSE: 97 mg/dL (ref 70–140)
Potassium: 3.7 mEq/L (ref 3.5–5.1)
SODIUM: 142 meq/L (ref 136–145)
Total Protein: 7.1 g/dL (ref 6.4–8.3)

## 2014-07-30 LAB — CBC WITH DIFFERENTIAL/PLATELET
BASO%: 0.4 % (ref 0.0–2.0)
BASOS ABS: 0 10*3/uL (ref 0.0–0.1)
EOS ABS: 0.1 10*3/uL (ref 0.0–0.5)
EOS%: 2 % (ref 0.0–7.0)
HEMATOCRIT: 32.9 % — AB (ref 34.8–46.6)
HEMOGLOBIN: 10.3 g/dL — AB (ref 11.6–15.9)
LYMPH#: 1.2 10*3/uL (ref 0.9–3.3)
LYMPH%: 23.3 % (ref 14.0–49.7)
MCH: 29.6 pg (ref 25.1–34.0)
MCHC: 31.3 g/dL — ABNORMAL LOW (ref 31.5–36.0)
MCV: 94.5 fL (ref 79.5–101.0)
MONO#: 0.5 10*3/uL (ref 0.1–0.9)
MONO%: 9.9 % (ref 0.0–14.0)
NEUT%: 64.4 % (ref 38.4–76.8)
NEUTROS ABS: 3.3 10*3/uL (ref 1.5–6.5)
PLATELETS: 258 10*3/uL (ref 145–400)
RBC: 3.48 10*6/uL — ABNORMAL LOW (ref 3.70–5.45)
RDW: 15.1 % — ABNORMAL HIGH (ref 11.2–14.5)
WBC: 5.1 10*3/uL (ref 3.9–10.3)

## 2014-07-30 LAB — PROTHROMBIN TIME
INR: 3.15 — ABNORMAL HIGH (ref <1.50)
Prothrombin Time: 32.6 seconds — ABNORMAL HIGH (ref 11.6–15.2)

## 2014-07-30 LAB — PROTIME-INR

## 2014-07-30 LAB — CEA: CEA: 4.3 ng/mL (ref 0.0–5.0)

## 2014-07-30 MED ORDER — FLUOROURACIL CHEMO INJECTION 2.5 GM/50ML
400.0000 mg/m2 | Freq: Once | INTRAVENOUS | Status: AC
  Administered 2014-07-30: 750 mg via INTRAVENOUS
  Filled 2014-07-30: qty 15

## 2014-07-30 MED ORDER — LEUCOVORIN CALCIUM INJECTION 350 MG
400.0000 mg/m2 | Freq: Once | INTRAVENOUS | Status: AC
  Administered 2014-07-30: 768 mg via INTRAVENOUS
  Filled 2014-07-30: qty 38.4

## 2014-07-30 MED ORDER — DEXTROSE 5 % IV SOLN
180.0000 mg/m2 | Freq: Once | INTRAVENOUS | Status: AC
  Administered 2014-07-30: 346 mg via INTRAVENOUS
  Filled 2014-07-30: qty 17.3

## 2014-07-30 MED ORDER — ATROPINE SULFATE 1 MG/ML IJ SOLN
0.5000 mg | Freq: Once | INTRAMUSCULAR | Status: AC | PRN
  Administered 2014-07-30: 0.5 mg via INTRAVENOUS

## 2014-07-30 MED ORDER — PROCHLORPERAZINE EDISYLATE 5 MG/ML IJ SOLN
INTRAMUSCULAR | Status: AC
  Filled 2014-07-30: qty 2

## 2014-07-30 MED ORDER — ONDANSETRON 16 MG/50ML IVPB (CHCC)
INTRAVENOUS | Status: AC
Start: 2014-07-30 — End: 2014-07-30
  Filled 2014-07-30: qty 16

## 2014-07-30 MED ORDER — FLUOROURACIL CHEMO INJECTION 5 GM/100ML
2400.0000 mg/m2 | INTRAVENOUS | Status: DC
  Administered 2014-07-30: 4600 mg via INTRAVENOUS
  Filled 2014-07-30: qty 92

## 2014-07-30 MED ORDER — PROCHLORPERAZINE EDISYLATE 5 MG/ML IJ SOLN
10.0000 mg | Freq: Once | INTRAMUSCULAR | Status: AC
  Administered 2014-07-30: 10 mg via INTRAVENOUS

## 2014-07-30 MED ORDER — DEXAMETHASONE SODIUM PHOSPHATE 20 MG/5ML IJ SOLN
INTRAMUSCULAR | Status: AC
  Filled 2014-07-30: qty 5

## 2014-07-30 MED ORDER — SODIUM CHLORIDE 0.9 % IV SOLN
Freq: Once | INTRAVENOUS | Status: AC
  Administered 2014-07-30: 09:00:00 via INTRAVENOUS

## 2014-07-30 MED ORDER — OXYCODONE HCL 5 MG PO TABS: 5.0000 mg | ORAL_TABLET | ORAL | Status: DC | PRN

## 2014-07-30 MED ORDER — DEXAMETHASONE SODIUM PHOSPHATE 20 MG/5ML IJ SOLN
20.0000 mg | Freq: Once | INTRAMUSCULAR | Status: AC
  Administered 2014-07-30: 20 mg via INTRAVENOUS

## 2014-07-30 MED ORDER — ONDANSETRON 16 MG/50ML IVPB (CHCC)
16.0000 mg | Freq: Once | INTRAVENOUS | Status: AC
  Administered 2014-07-30: 16 mg via INTRAVENOUS

## 2014-07-30 MED ORDER — ATROPINE SULFATE 1 MG/ML IJ SOLN
INTRAMUSCULAR | Status: AC
  Filled 2014-07-30: qty 1

## 2014-07-30 NOTE — Progress Notes (Signed)
Patient c/o abd cramping at 1015. Atropine 0.5 mg IV given at 1020; Irinotecan restarted at 1030. Patient denies cramping after atropine administration.

## 2014-07-30 NOTE — Progress Notes (Signed)
1130- patient c/o nausea with emesis x 1. Okay to give compazine 10 mg IV x 1 dose per Lattie Haw NP. Nausea/emesis subsided.

## 2014-07-30 NOTE — Patient Instructions (Signed)
Alicia [name] Discharge Instructions for Patients Receiving Chemotherapy  Today you received the following chemotherapy agents: Irinotecan, Leucovorin, 5FU push/pump.  To help prevent nausea and vomiting after your treatment, we encourage you to take your nausea medication: Zofran 4 mg every 6 hrs or Compazine 10 mg every 6 hrs as needed for nausea.   If you develop nausea and vomiting that is not controlled by your nausea medication, call the clinic.   BELOW ARE SYMPTOMS THAT SHOULD BE REPORTED IMMEDIATELY:  *FEVER GREATER THAN 100.5 F  *CHILLS WITH OR WITHOUT FEVER  NAUSEA AND VOMITING THAT IS NOT CONTROLLED WITH YOUR NAUSEA MEDICATION  *UNUSUAL SHORTNESS OF BREATH  *UNUSUAL BRUISING OR BLEEDING  TENDERNESS IN MOUTH AND THROAT WITH OR WITHOUT PRESENCE OF ULCERS  *URINARY PROBLEMS  *BOWEL PROBLEMS  UNUSUAL RASH Items with * indicate a potential emergency and should be followed up as soon as possible.  Feel free to call the clinic you have any questions or concerns. The clinic phone number is [phone].   Irinotecan injection Qu es este medicamento? El IRINOTECAN es un agente quimioteraputico. Se utiliza en el tratamiento del cncer de colon y recto. Este medicamento puede ser utilizado para otros usos; si tiene alguna pregunta consulte con su proveedor de atencin mdica o con su farmacutico. MARCAS COMERCIALES DISPONIBLES: Camptosar Qu le debo informar a mi profesional de la salud antes de tomar este medicamento? Necesita saber si usted presenta alguno de los siguientes problemas o situaciones: -trastornos sanguneos -deshidratacin -diarrea -infeccin (especialmente infecciones virales, como varicela o herpes) -enfermedad renal -conteos sanguneos bajos, como baja cantidad de glbulos blancos, glbulos rojos y plaquetas -radioterapia reciente o continuada -una reaccin alrgica o inusual al irinotecan, sorbitol, a otros agentes  quimioteraputicos, a otros medicamentos, alimentos, colorantes o conservantes -si est embarazada o buscando quedar embarazada -si est amamantando a un beb Cmo debo utilizar este medicamento? Este medicamento se administra mediante infusin por va intravenosa. Lo administra un profesional de Technical sales engineer en un hospital o en un entorno clnico. Hable con su pediatra para informarse acerca del uso de este medicamento en nios. Puede requerir atencin especial. Sobredosis: Pngase en contacto inmediatamente con un centro toxicolgico o una sala de urgencia si usted cree que haya tomado demasiado medicamento. ATENCIN: [employer] es solo para usted. No comparta este medicamento con nadie. Qu sucede si me olvido de una dosis? Es importante no olvidar ninguna dosis. Informe a su mdico o a su profesional de la salud si no puede asistir a Photographer. Qu puede interactuar con este medicamento? No tome esta medicina con ninguno de los siguientes medicamentos: -atazanavir -ciertos medicamentos para las infecciones micticas, tales como itraconazol y Arboriculturist -hierba de [place] Esta medicina tambin puede interactuar con los siguientes medicamentos: -dexametasona -diurticos -laxantes -medicamentos para convulsiones, tales como carbamazepina, mefobarbital, fenobarbital, fenitona, primidona -medicamentos para aumentar conteos sanguneos, tales como filgrastim, pegfilgrastim, sargramostim -proclorperazina -vacunas Puede ser que esta lista no menciona todas las posibles interacciones. Informe a su profesional de [employer]	[place] de [employer] productos a base de hierbas, medicamentos de [place] o suplementos nutritivos que est tomando. Si usted fuma, consume bebidas alcohlicas o si utiliza drogas ilegales, indqueselo tambin a su profesional de KB Home	[place]. Algunas sustancias pueden interactuar con su medicamento. A qu debo estar atento al usar [employer]? Se supervisar su condicin  atentamente mientras reciba este medicamento. Tendr que hacerse anlisis de sangre peridicos mientras est tomando este medicamento. Este medicamento puede hacerle sentir un Tree surgeon  general. Esto es normal ya que la quimioterapia afecta tanto a las clulas sanas como a las clulas cancerosas. Si presenta alguno de los [employer], infrmelos. Sin embargo, contine con el tratamiento aun si se siente enfermo, a menos que su mdico le indique que lo suspenda. En algunos casos, podr recibir [employer] para ayudarle con los efectos secundarios. [place] para usar. Puede experimentar mareos o somnolencia. No conduzca ni utilice maquinaria ni haga nada que Associate Professor en estado de alerta hasta que sepa cmo le afecta este medicamento. No se siente ni se ponga de pie con rapidez, especialmente si es un paciente de edad avanzada. Esto reduce el riesgo de mareos o [employer]. Consulte a su mdico o a su profesional de la salud por asesoramiento si tiene fiebre, escalofros, dolor de garganta o cualquier otro sntoma de resfro o gripe. No se trate usted mismo. Este medicamento puede reducir la capacidad del cuerpo para combatir infecciones. Trate de no acercarse a personas que estn enfermas. [employer] puede aumentar el riesgo de magulladuras o sangrado. Consulte a su mdico o a su profesional de la salud si observa sangrados inusuales. Proceda con cuidado al cepillar sus dientes, usar hilo dental o Risk manager palillos para los dientes, ya que puede contraer una infeccin o Therapist, art con mayor facilidad. Si se somete a algn tratamiento dental, informe a su dentista que est [employer]. Evite tomar productos que contienen aspirina, acetaminofeno, ibuprofeno, naproxeno o quetoprofeno a menos que as lo indique su mdico. Estos productos pueden disimular la fiebre. No se debe quedar embarazada mientras recibe este medicamento. Las mujeres deben informar a su  mdico si estn buscando quedar embarazadas o si creen que estn embarazadas. Existe la posibilidad de efectos secundarios graves a un beb sin nacer. Para ms informacin hable con su profesional de la salud o su farmacutico. No debe Economist a un beb mientras est usando este medicamento. Qu efectos secundarios puedo tener al [employer] este medicamento? Efectos secundarios que debe informar a su mdico o a Barrister's clerk de la salud tan pronto como sea posible: -reacciones alrgicas como erupcin cutnea, picazn o urticarias, hinchazn de la cara, labios o lengua -conteos sanguneos bajos - este medicamento puede reducir la cantidad de glbulos blancos, glbulos rojos y plaquetas. Su riesgo de infeccin y [place]. -signos de infeccin - fiebre o escalofros, tos, dolor de garganta, Social research officer, government o dificultad para Garment/textile technologist -signos de reduccin de plaquetas o sangrado - magulladuras, puntos rojos en la piel, heces de color oscuro o con aspecto alquitranado, sangre en la orina -signos de reduccin de glbulos rojos - cansancio o debilidad inusual, desmayos, sensacin de Chief of Staff -problemas respiratorios -dolor en el pecho -diarrea -sensacin de desmayos o mareos, cadas -enrojecimiento, goteo de la nariz, sudando durante la infusin -dolor o llagas en la boca -dolor, hinchazn, enrojecimiento o irritacin en el lugar de la inyeccin -dolor, hinchazn, calor en las piernas -dolor, hormigueo o entumecimiento de manos o pies -problemas de coordinacin, del habla, al caminar -calambres, dolores estomacales -dificultad para Garment/textile technologist o cambios en el volumen de orina -vmito, es Software engineer no puede retener bebidas o alimentos -color amarillento de los ojos o la piel Efectos secundarios que, por lo general, no requieren Geophysical data processor (debe informarlos a su mdico o a Barrister's clerk de la salud si persisten o si son molestos): -estreimiento -cada del cabello -dolor de cabeza -prdida del  apetito -nuseas, vmito -Higher education careers adviser Puede ser que esta lista no menciona todos los  posibles efectos secundarios. Comunquese a su mdico por asesoramiento mdico [employer] Usted puede informar los efectos secundarios a la FDA por telfono al 1-800-FDA-1088. Dnde debo guardar mi medicina? Este medicamento se administra en hospitales o clnicas y no necesitar guardarlo en su domicilio. ATENCIN: Este folleto es un resumen. Puede ser que no cubra toda la posible informacin. Si usted tiene preguntas acerca de esta medicina, consulte con su mdico, su farmacutico o su profesional de Technical sales engineer.  2015, Elsevier/Gold Standard. (2013-06-05 14:13:23)  Leucovorin injection Qu es este medicamento? La LEUCOVORINA se [place] para prevenir o tratar [employer] nocivos de ciertos medicamentos. Este medicamento tambin sirve para tratar la anemia provocada por una nivel bajo de cido flico en el cuerpo. Tambin se puede administrar con 5-fluorouracilo (5-FU), para tratar el cncer de colon. Este medicamento puede ser utilizado para otros usos; si tiene alguna pregunta consulte con su proveedor de atencin mdica o con su farmacutico. Qu le debo informar a mi profesional de la salud antes de tomar este medicamento? Necesita saber si usted presenta alguno de los siguientes problemas o situaciones: -anemia debido a bajos niveles de vitamina B-12 en la sangre -una reaccin alrgica o inusual a la leucovorina, cido flico, a otros medicamentos, alimentos, colorantes o conservantes -si est embarazada o buscando quedar embarazada -si est amamantando a un beb Cmo debo utilizar este medicamento? Este medicamento se administra mediante inyeccin por va intramuscular o intravenosa. Lo administra un profesional de Technical sales engineer en un hospital o en un entorno clnico. Hable con su pediatra para informarse acerca del uso de este medicamento en nios. Puede requerir atencin  especial. Sobredosis: Pngase en contacto inmediatamente con un centro toxicolgico o una sala de urgencia si usted cree que haya tomado demasiado medicamento. ATENCIN: [employer] es solo para usted. No comparta este medicamento con nadie. Qu sucede si me olvido de una dosis? No se aplica en este caso. Qu puede interactuar con este medicamento? -capecitabina -fluorouracilo -fenobarbital -fenitona -primidona -trimetoprima- sulfametoxasol Puede ser que esta lista no menciona todas las posibles interacciones. Informe a su profesional de KB Home	[place] de [employer] productos a base de hierbas, medicamentos de [place] o suplementos nutritivos que est tomando. Si usted fuma, consume bebidas alcohlicas o si utiliza drogas ilegales, indqueselo tambin a su profesional de KB Home	[place]. Algunas sustancias pueden interactuar con su medicamento. A qu debo estar atento al usar Coca[employer]Cola? Se supervisar su estado de salud atentamente mientras reciba este medicamento. Este medicamento puede aumentar los efectos secundarios de 5-fluorouracilo, 5-FU. Si tiene diarrea o [employer] boca que no mejoran o que [place], consulte a su mdico o a su profesional de KB Home	[place]. Qu efectos secundarios puedo tener al [employer] este medicamento? Efectos secundarios que debe informar a su mdico o a Barrister's clerk de la salud tan pronto como sea posible: -Chief of Staff como erupcin cutnea, picazn o urticarias, hinchazn de la cara, labios o lengua -problemas respiratorios -fiebre, infeccin -llagas en la boca -sangrado, magulladuras inusuales -cansancio o debilidad inusual Efectos secundarios que, por lo general, no requieren atencin mdica (debe informarlos a su mdico o a su profesional de la salud si persisten o si son molestos): -estreimiento o diarrea -prdida del apetito -nuseas, vmito Puede ser que [employer] no menciona todos los posibles efectos secundarios. Comunquese a su  mdico por asesoramiento mdico [employer]. Usted puede informar los efectos secundarios a la FDA por telfono al 1-800-FDA-1088. Dnde debo guardar mi medicina? Este medicamento  se administra en hospitales o clnicas y no necesitar guardarlo en su domicilio. ATENCIN: Este folleto es un resumen. Puede ser que no cubra toda la posible informacin. Si usted tiene preguntas acerca de esta medicina, consulte con su mdico, su farmacutico o su profesional de Technical sales engineer.  2015, Elsevier/Gold Standard. (2006-11-22 17:00:00)  Fluorouracil, 5-FU injection Qu es este medicamento? [place], 5-FU es un agente quimioteraputico. Este medicamento reduce el crecimiento de las clulas cancerosas. Se utiliza en el tratamiento de muchos tipos de cncer, incluyendo el cncer de mama, de colon y recto, pancretico y de Paramedic. Este medicamento puede ser utilizado para otros usos; si tiene alguna pregunta consulte con su proveedor de atencin mdica o con su farmacutico. MARCAS COMERCIALES DISPONIBLES: Adrucil Qu le debo informar a mi profesional de la salud antes de tomar este medicamento? Necesita saber si usted presenta alguno de los siguientes problemas o situaciones: -trastornos sanguneos -deficiencia de la dihidropirimidina deshidrogenasa (DPD) -infeccin (especialmente infecciones virales, como varicela o herpes) -enfermedad renal -enfermedad heptica -desnutrido, malnutricin -radioterapia reciente o continuada -una reaccin alrgica o inusual al fluorouracilo, a otros agentes quimioteraputicos, otros medicamentos, alimentos, colorantes o conservantes -si est embarazada o buscando quedar embarazada -si est amamantando a un beb Cmo debo utilizar este medicamento? Este medicamento se administra mediante inyeccin o infusin por va intravenosa. Lo administra un profesional de la salud calificado en un hospital o en un entorno clnico. Hable con su pediatra para  informarse acerca del uso de este medicamento en nios. Puede requerir atencin especial. Sobredosis: Pngase en contacto inmediatamente con un centro toxicolgico o una sala de urgencia si usted cree que haya tomado demasiado medicamento. ATENCIN: [employer] es solo para usted. No comparta este medicamento con nadie. Qu sucede si me olvido de una dosis? Es importante no olvidar ninguna dosis. Informe a su mdico o a su profesional de la salud si no puede asistir a Photographer. Qu puede interactuar con este medicamento? -alopurinol -cimetidina -dapsona -digoxina -hidroxiurea -leucovorina -levamisol -medicamentos para convulsiones, tales como etotona, fosfenitona, fenitona -medicamentos para incrementar los conteos sanguneos, tales como filgrastim, pegfilgrastim, sargramostim -medicamentos que tratan o previenen cogulos sanguneos, tales como warfarina, enoxaparina y dalteparina -metotrexato -metronidazol -pirimetamina -otros agentes quimioteraputicos, tales como busulfn, cisplatino, estramustina, vinblastina -trimetoprima -trimetrexato -vacunas Consulte a su mdico o a su profesional de la salud antes de tomar cualquiera de los siguientes medicamentos: -acetaminofeno -aspirina -ibuprofeno -quetoprofeno -naproxeno Puede ser que esta lista no menciona todas las posibles interacciones. Informe a su profesional de KB Home	[place] de [employer] productos a base de hierbas, medicamentos de [place] o suplementos nutritivos que est tomando. Si usted fuma, consume bebidas alcohlicas o si utiliza drogas ilegales, indqueselo tambin a su profesional de [employer] Home	[place]. Algunas sustancias pueden interactuar con su medicamento. A qu debo estar atento al usar Coca[employer]Cola? Visite a su mdico para chequear su evolucin peridicamente. Este medicamento puede hacerle sentir un Nurse, mental health. Esto es normal ya que la quimioterapia afecta tanto a las clulas sanas como a las clulas  cancerosas. Si presenta alguno de los [employer], infrmelos. Sin embargo, contine con el tratamiento aun si se siente enfermo, a menos que su mdico le indique que lo suspenda. En algunos casos, podr recibir [employer] para ayudarle con los efectos secundarios. Siga las instrucciones para usarlos. Consulte a su mdico o a su profesional de la salud por asesoramiento si tiene fiebre, escalofros, dolor de garganta o cualquier otro sntoma de resfro o gripe. No se trate usted mismo.  Este medicamento puede reducir la capacidad del cuerpo para combatir infecciones. Trate de no acercarse a personas que estn enfermas. [employer] puede aumentar el riesgo de magulladuras o sangrado. Consulte a su mdico o a su profesional de la salud si observa sangrados inusuales. Proceda con cuidado al cepillar sus dientes, usar hilo dental o Risk manager palillos para los dientes, ya que puede contraer una infeccin o Therapist, art con mayor facilidad. Si se somete a algn tratamiento dental, informe a su dentista que est [employer]. Evite tomar productos que contienen aspirina, acetaminofeno, ibuprofeno, naproxeno o quetoprofeno a menos que as lo indique su mdico. Estos productos pueden disimular la fiebre. No se debe quedar embarazada mientras recibe este medicamento. Las mujeres deben informar a su mdico si estn buscando quedar embarazadas o si creen que estn embarazadas. Existe la posibilidad de efectos secundarios graves a un beb sin nacer. Para ms informacin hable con su profesional de la salud o su farmacutico. No debe Economist a un beb mientras est usando este medicamento. Los hombres deben informar a su mdico si quieren tener nios. Este medicamento puede reducir el conteo de esperma. No trate la diarrea con productos de [employer]. Comunquese con su mdico si tiene diarrea que dura ms de 2 das o si es severa y [place]. Este medicamento puede aumentar la sensibilidad  al sol. Mantngase fuera de Administrator. Si no lo puede evitar, utilice ropa protectora y crema de Photographer. No utilice lmparas solares, camas solares ni cabinas solares. Qu efectos secundarios puedo tener al [employer] este medicamento? Efectos secundarios que debe informar a su mdico o a Barrister's clerk de la salud tan pronto como sea posible: -reacciones alrgicas como erupcin cutnea, picazn o urticarias, hinchazn de la cara, labios o lengua -conteos sanguneos bajos - este medicamento puede reducir la cantidad de glbulos blancos, glbulos rojos y plaquetas. Su riesgo de infeccin y [place]. -signos de infeccin - fiebre o escalofros, tos, dolor de garganta, Social research officer, government o dificultad para orinar -signos de reduccin de plaquetas o sangrado - magulladuras, puntos rojos en la piel, heces de color oscuro o con aspecto alquitranado, sangre en la orina -signos de reduccin de glbulos rojos - cansancio o debilidad inusual, desmayos, sensacin de mareo -problemas respiratorios -cambios en la visin -dolor en el pecho -llagas en la boca -nuseas, vmito -dolor, hinchazn, enrojecimiento en el lugar de la inyeccin -hormigueo, dolor, entumecimiento de manos o pies -enrojecimiento, hinchazn o llagas en las manos o pies -dolor estomacal -sangrado inusuales Efectos secundarios que, por lo general, no requieren atencin mdica (debe informarlos a su mdico o a su profesional de la salud si persisten o si son molestos): -cambios en las uas de las manos o pies -diarrea -picazn o sequedad de la piel -cada del cabello -dolor de cabeza -prdida del apetito -sensibilidad de los ojos a la luz -Higher education careers adviser -ojos inusualmente llorosos Puede ser que esta lista no menciona todos los posibles efectos secundarios. Comunquese a su mdico por asesoramiento mdico [employer] Usted puede informar los efectos secundarios a la FDA por telfono al  1-800-FDA-1088. Dnde debo guardar mi medicina? Este medicamento se administra en hospitales o clnicas y no necesitar guardarlo en su domicilio. ATENCIN: Este folleto es un resumen. Puede ser que no cubra toda la posible informacin. Si usted tiene preguntas acerca de esta medicina, consulte con su mdico, su farmacutico o su profesional de Technical sales engineer.  2015, Elsevier/Gold Standard. (2007-06-09 16:07:00)

## 2014-07-30 NOTE — Progress Notes (Signed)
1210- Discharge AVS instructions explained via interpreter. Chemo spill kit given and explained to patient and husband. No questions or concerns at this time. Encouraged to drink plenty of fluids and take compazine tonight before bedtime, even if nausea isn't present. Patient discharged ambulatory in no acute distress with husband and interpreter. Knows when to call for change in status, and when to return for pump d/c.

## 2014-08-01 ENCOUNTER — Ambulatory Visit (HOSPITAL_BASED_OUTPATIENT_CLINIC_OR_DEPARTMENT_OTHER): Payer: Self-pay

## 2014-08-01 DIAGNOSIS — C169 Malignant neoplasm of stomach, unspecified: Secondary | ICD-10-CM

## 2014-08-01 DIAGNOSIS — Z452 Encounter for adjustment and management of vascular access device: Secondary | ICD-10-CM

## 2014-08-01 DIAGNOSIS — C166 Malignant neoplasm of greater curvature of stomach, unspecified: Secondary | ICD-10-CM

## 2014-08-01 MED ORDER — SODIUM CHLORIDE 0.9 % IJ SOLN
10.0000 mL | INTRAMUSCULAR | Status: DC | PRN
  Administered 2014-08-01: 10 mL
  Filled 2014-08-01: qty 10

## 2014-08-01 MED ORDER — HEPARIN SOD (PORK) LOCK FLUSH 100 UNIT/ML IV SOLN
500.0000 [IU] | Freq: Once | INTRAVENOUS | Status: AC | PRN
  Administered 2014-08-01: 500 [IU]
  Filled 2014-08-01: qty 5

## 2014-08-10 ENCOUNTER — Other Ambulatory Visit: Payer: Self-pay | Admitting: Oncology

## 2014-08-12 ENCOUNTER — Ambulatory Visit (HOSPITAL_BASED_OUTPATIENT_CLINIC_OR_DEPARTMENT_OTHER): Payer: Medicaid Other | Admitting: Oncology

## 2014-08-12 ENCOUNTER — Telehealth: Payer: Self-pay | Admitting: *Deleted

## 2014-08-12 ENCOUNTER — Encounter: Payer: Self-pay | Admitting: Oncology

## 2014-08-12 ENCOUNTER — Telehealth: Payer: Self-pay | Admitting: Oncology

## 2014-08-12 ENCOUNTER — Other Ambulatory Visit (HOSPITAL_BASED_OUTPATIENT_CLINIC_OR_DEPARTMENT_OTHER): Payer: Medicaid Other

## 2014-08-12 VITALS — BP 143/74 | HR 75 | Temp 98.5°F | Resp 18 | Ht 60.0 in | Wt 190.3 lb

## 2014-08-12 DIAGNOSIS — R918 Other nonspecific abnormal finding of lung field: Secondary | ICD-10-CM

## 2014-08-12 DIAGNOSIS — D709 Neutropenia, unspecified: Secondary | ICD-10-CM

## 2014-08-12 DIAGNOSIS — C169 Malignant neoplasm of stomach, unspecified: Secondary | ICD-10-CM

## 2014-08-12 DIAGNOSIS — G620 Drug-induced polyneuropathy: Secondary | ICD-10-CM

## 2014-08-12 DIAGNOSIS — I2699 Other pulmonary embolism without acute cor pulmonale: Secondary | ICD-10-CM

## 2014-08-12 DIAGNOSIS — C166 Malignant neoplasm of greater curvature of stomach, unspecified: Secondary | ICD-10-CM

## 2014-08-12 DIAGNOSIS — Z7901 Long term (current) use of anticoagulants: Secondary | ICD-10-CM

## 2014-08-12 DIAGNOSIS — R109 Unspecified abdominal pain: Secondary | ICD-10-CM

## 2014-08-12 LAB — COMPREHENSIVE METABOLIC PANEL (CC13)
ALK PHOS: 124 U/L (ref 40–150)
ALT: 16 U/L (ref 0–55)
ANION GAP: 7 meq/L (ref 3–11)
AST: 14 U/L (ref 5–34)
Albumin: 3.5 g/dL (ref 3.5–5.0)
BILIRUBIN TOTAL: 0.83 mg/dL (ref 0.20–1.20)
BUN: 6.8 mg/dL — ABNORMAL LOW (ref 7.0–26.0)
CO2: 30 mEq/L — ABNORMAL HIGH (ref 22–29)
Calcium: 8.8 mg/dL (ref 8.4–10.4)
Chloride: 107 mEq/L (ref 98–109)
Creatinine: 0.6 mg/dL (ref 0.6–1.1)
GLUCOSE: 101 mg/dL (ref 70–140)
Potassium: 3.7 mEq/L (ref 3.5–5.1)
Sodium: 143 mEq/L (ref 136–145)
Total Protein: 6.3 g/dL — ABNORMAL LOW (ref 6.4–8.3)

## 2014-08-12 LAB — CBC WITH DIFFERENTIAL/PLATELET
BASO%: 0.7 % (ref 0.0–2.0)
BASOS ABS: 0 10*3/uL (ref 0.0–0.1)
EOS%: 3.1 % (ref 0.0–7.0)
Eosinophils Absolute: 0.1 10*3/uL (ref 0.0–0.5)
HCT: 29.3 % — ABNORMAL LOW (ref 34.8–46.6)
HEMOGLOBIN: 9.1 g/dL — AB (ref 11.6–15.9)
LYMPH#: 0.8 10*3/uL — AB (ref 0.9–3.3)
LYMPH%: 49.2 % (ref 14.0–49.7)
MCH: 28.9 pg (ref 25.1–34.0)
MCHC: 31.1 g/dL — ABNORMAL LOW (ref 31.5–36.0)
MCV: 92.7 fL (ref 79.5–101.0)
MONO#: 0.2 10*3/uL (ref 0.1–0.9)
MONO%: 11.1 % (ref 0.0–14.0)
NEUT%: 35.9 % — ABNORMAL LOW (ref 38.4–76.8)
NEUTROS ABS: 0.6 10*3/uL — AB (ref 1.5–6.5)
Platelets: 185 10*3/uL (ref 145–400)
RBC: 3.16 10*6/uL — ABNORMAL LOW (ref 3.70–5.45)
RDW: 16 % — AB (ref 11.2–14.5)
WBC: 1.7 10*3/uL — AB (ref 3.9–10.3)

## 2014-08-12 LAB — PROTIME-INR
INR: 1 — ABNORMAL LOW (ref 2.00–3.50)
PROTIME: 12 s (ref 10.6–13.4)

## 2014-08-12 MED ORDER — PROCHLORPERAZINE MALEATE 10 MG PO TABS: 10.0000 mg | ORAL_TABLET | Freq: Four times a day (QID) | ORAL | Status: DC | PRN

## 2014-08-12 MED ORDER — OXYCODONE HCL 5 MG PO TABS: 5.0000 mg | ORAL_TABLET | ORAL | Status: DC | PRN

## 2014-08-12 NOTE — Telephone Encounter (Signed)
, °

## 2014-08-12 NOTE — Progress Notes (Signed)
[place] OFFICE PROGRESS NOTE   Diagnosis:  Gastric cancer  INTERVAL HISTORY:   Alicia [name] returns as scheduled. She is S/P 1 cycle of FOLFIRI given on 08/09/14. Due for cycle 2 on 08/13/14. She continues to have abdominal pain mainly around the umbilicus. Taking 10 mg of Oxycodone every 4 hours, but still having significant pain. She continues to have intermittent nausea/vomiting mainly with solids. Using Zofran with only partial improvement. Tells me she is out of Compazine and needs a refill. Reports that she has dark brown vomit with what she reports as blood clots. Denies bright red blood, however. Has had intermittent diarrhea and uses Imodium with improvement. She has persistent numbness in the fingertips and toes. Denies fevers. Denies chest pain, SOB, DOE.  Objective:  Vital signs in last 24 hours:  Blood pressure 143/74, pulse 75, temperature 98.5 F (36.9 C), temperature source Oral, resp. rate 18, height 5' (1.524 m), weight 190 lb 4.8 oz (86.32 kg), SpO2 100 %.    HEENT: no thrush or ulcers. Resp: lungs clear bilaterally. Cardio: regular rate and rhythm. GI: abdomen is soft. Left side of the umbilicus is tender, indurated. Palpable mass with associated tenderness superior to the midline abdominal scar. Vascular: no leg edema. Port-A-Cath without erythema.  Lab Results:  Lab Results  Component Value Date   WBC 1.7* 08/12/2014   HGB 9.1* 08/12/2014   HCT 29.3* 08/12/2014   MCV 92.7 08/12/2014   PLT 185 08/12/2014   NEUTROABS 0.6* 08/12/2014    Imaging:  No results found.  Medications: I have reviewed the patient's current medications.  Assessment/Plan: 1. Stage IV gastric cancer  Status post bilateral oophorectomy, total abdominal hysterectomy, omentectomy, resection of abdominal wall and pelvic masses, bilateral pelvic lymphadenectomy 10/18/2013 with the pathology confirming moderate to poorly differentiated adenocarcinoma  Stomach  biopsy 11/28/2013 revealed adenocarcinoma  Status post 12 cycles of FOLFOX, last given 04/25/2014  Restaging CT 04/29/2014 with thickening of the stomach and nodular densities adjacent to the greater curvature-unchanged, faint groundglass nodularity in the lungs-nonspecific  Restaging CT 07/22/2014 with indeterminate lung nodules; moderate wall thickening in the body region of the stomach; fairly extensive omental disease and findings suspicious for abdominal wall and incisional disease.  Started systemic chemotherapy with FOLFIRI on 07/30/14.  2. Oxaliplatin neuropathy  3. Bilateral upper and lower lobe pulmonary emboli within segmental branches-now maintained on Coumadin. Followed by Coumadin clinic per PCP.   Disposition: Alicia [name] is s/p 1 cycle of FOLFIRI and experience nausea, vomiting, and diarrhea. Has persistent abdominal pain not controlled with current dose of Oxycodone. She is neutropenic today.  Case reviewed with Dr. Benay Spice. Will hold chemotherapy this week. Recheck counts next week and will proceed with cycle 2 of FOLFIRI if counts are adequate. Will add Neulasta beginning with cycle 2. Patient was cautioned about neutropenic precautions and instructed to call for a temp of 100.5 or higher.  For her pain, she may use 5-15 mg every 4 hours for her pain. Will consider converting to a long-acting pain medication in the future. I have asked her to keep track of her Oxycodone usage. I have given her a new prescription for Oxycodone today.  I have given her a prescription for Compazine today for breakthrough nausea.  She was instructed to call us if she has persistent blood clots in her vomit and we will consider a referral to GI. If she notes bright red blood in her vomit, she is to go to the ER for  evaluation.  INR was 1.0 today. Will not make any changes to Coumadin due to the possibility of bleeding. She is followed by the Coumadin clinic at her PCP office and will  have a recheck INR next week. Further dosing per the Coumadin clinic.  We will see her in 1 week prior to cycle 2 to reassess her pain, counts, and nausea/vomiting. She will contact the office in the interim with any problems.  Patient seen with Dr. Benay Spice. 25 minutes were spent face-to-face at today's visit with the majority of the time involved in counseling/coordination of care.  Mikey Bussing DNP, AGPCNP-BC, AOCNP  08/12/2014  4:32 PM

## 2014-08-12 NOTE — Telephone Encounter (Signed)
Per staff message and POF I have scheduled appts. Advised scheduler of appts. JMW  

## 2014-08-13 ENCOUNTER — Ambulatory Visit: Payer: Self-pay

## 2014-08-19 ENCOUNTER — Other Ambulatory Visit: Payer: Self-pay | Admitting: Oncology

## 2014-08-19 ENCOUNTER — Ambulatory Visit: Payer: Medicaid Other | Attending: Family Medicine | Admitting: Pharmacist

## 2014-08-19 DIAGNOSIS — I2699 Other pulmonary embolism without acute cor pulmonale: Secondary | ICD-10-CM

## 2014-08-19 DIAGNOSIS — Z7901 Long term (current) use of anticoagulants: Secondary | ICD-10-CM

## 2014-08-19 LAB — POCT INR: INR: 1.6

## 2014-08-19 MED ORDER — WARFARIN SODIUM 4 MG PO TABS: 8.0000 mg | ORAL_TABLET | Freq: Every day | ORAL | Status: DC

## 2014-08-20 ENCOUNTER — Other Ambulatory Visit (HOSPITAL_BASED_OUTPATIENT_CLINIC_OR_DEPARTMENT_OTHER): Payer: Medicaid Other

## 2014-08-20 ENCOUNTER — Telehealth: Payer: Self-pay | Admitting: Oncology

## 2014-08-20 ENCOUNTER — Ambulatory Visit (HOSPITAL_BASED_OUTPATIENT_CLINIC_OR_DEPARTMENT_OTHER): Payer: Medicaid Other

## 2014-08-20 ENCOUNTER — Encounter: Payer: Self-pay | Admitting: Nutrition

## 2014-08-20 ENCOUNTER — Ambulatory Visit (HOSPITAL_BASED_OUTPATIENT_CLINIC_OR_DEPARTMENT_OTHER): Payer: Medicaid Other | Admitting: Nurse Practitioner

## 2014-08-20 VITALS — BP 148/84 | HR 67 | Temp 98.2°F | Resp 17 | Ht 60.0 in | Wt 185.4 lb

## 2014-08-20 DIAGNOSIS — G620 Drug-induced polyneuropathy: Secondary | ICD-10-CM

## 2014-08-20 DIAGNOSIS — Z7901 Long term (current) use of anticoagulants: Secondary | ICD-10-CM

## 2014-08-20 DIAGNOSIS — C169 Malignant neoplasm of stomach, unspecified: Secondary | ICD-10-CM

## 2014-08-20 DIAGNOSIS — I2699 Other pulmonary embolism without acute cor pulmonale: Secondary | ICD-10-CM

## 2014-08-20 DIAGNOSIS — R918 Other nonspecific abnormal finding of lung field: Secondary | ICD-10-CM

## 2014-08-20 DIAGNOSIS — Z5111 Encounter for antineoplastic chemotherapy: Secondary | ICD-10-CM

## 2014-08-20 LAB — CBC WITH DIFFERENTIAL/PLATELET
BASO%: 0.8 % (ref 0.0–2.0)
Basophils Absolute: 0 10*3/uL (ref 0.0–0.1)
EOS%: 1.5 % (ref 0.0–7.0)
Eosinophils Absolute: 0.1 10*3/uL (ref 0.0–0.5)
HEMATOCRIT: 32.8 % — AB (ref 34.8–46.6)
HGB: 10.3 g/dL — ABNORMAL LOW (ref 11.6–15.9)
LYMPH%: 25 % (ref 14.0–49.7)
MCH: 28.8 pg (ref 25.1–34.0)
MCHC: 31.4 g/dL — AB (ref 31.5–36.0)
MCV: 91.5 fL (ref 79.5–101.0)
MONO#: 0.6 10*3/uL (ref 0.1–0.9)
MONO%: 14.4 % — AB (ref 0.0–14.0)
NEUT#: 2.6 10*3/uL (ref 1.5–6.5)
NEUT%: 58.3 % (ref 38.4–76.8)
PLATELETS: 293 10*3/uL (ref 145–400)
RBC: 3.58 10*6/uL — ABNORMAL LOW (ref 3.70–5.45)
RDW: 17 % — ABNORMAL HIGH (ref 11.2–14.5)
WBC: 4.5 10*3/uL (ref 3.9–10.3)
lymph#: 1.1 10*3/uL (ref 0.9–3.3)

## 2014-08-20 LAB — COMPREHENSIVE METABOLIC PANEL (CC13)
ALK PHOS: 125 U/L (ref 40–150)
ALT: 9 U/L (ref 0–55)
AST: 17 U/L (ref 5–34)
Albumin: 3.9 g/dL (ref 3.5–5.0)
Anion Gap: 7 mEq/L (ref 3–11)
BILIRUBIN TOTAL: 0.87 mg/dL (ref 0.20–1.20)
BUN: 10.1 mg/dL (ref 7.0–26.0)
CO2: 30 mEq/L — ABNORMAL HIGH (ref 22–29)
CREATININE: 0.7 mg/dL (ref 0.6–1.1)
Calcium: 9.4 mg/dL (ref 8.4–10.4)
Chloride: 104 mEq/L (ref 98–109)
EGFR: >90 mL/min/{1.73_m2} (ref >90)
GLUCOSE: 101 mg/dL (ref 70–140)
Potassium: 3.7 mEq/L (ref 3.5–5.1)
SODIUM: 142 meq/L (ref 136–145)
TOTAL PROTEIN: 7 g/dL (ref 6.4–8.3)

## 2014-08-20 LAB — PROTIME-INR
INR: 2 (ref 2.00–3.50)
Protime: 24 Seconds — ABNORMAL HIGH (ref 10.6–13.4)

## 2014-08-20 MED ORDER — ONDANSETRON 16 MG/50ML IVPB (CHCC)
16.0000 mg | Freq: Once | INTRAVENOUS | Status: AC
  Administered 2014-08-20: 16 mg via INTRAVENOUS

## 2014-08-20 MED ORDER — DEXAMETHASONE SODIUM PHOSPHATE 20 MG/5ML IJ SOLN
INTRAMUSCULAR | Status: AC
  Filled 2014-08-20: qty 5

## 2014-08-20 MED ORDER — SODIUM CHLORIDE 0.9 % IV SOLN
Freq: Once | INTRAVENOUS | Status: AC
  Administered 2014-08-20: 11:00:00 via INTRAVENOUS

## 2014-08-20 MED ORDER — ATROPINE SULFATE 1 MG/ML IJ SOLN
0.5000 mg | Freq: Once | INTRAMUSCULAR | Status: AC | PRN
  Administered 2014-08-20: 0.5 mg via INTRAVENOUS

## 2014-08-20 MED ORDER — ONDANSETRON 16 MG/50ML IVPB (CHCC)
INTRAVENOUS | Status: AC
  Filled 2014-08-20: qty 16

## 2014-08-20 MED ORDER — FLUOROURACIL CHEMO INJECTION 2.5 GM/50ML
400.0000 mg/m2 | Freq: Once | INTRAVENOUS | Status: AC
  Administered 2014-08-20: 750 mg via INTRAVENOUS
  Filled 2014-08-20: qty 15

## 2014-08-20 MED ORDER — HEPARIN SOD (PORK) LOCK FLUSH 100 UNIT/ML IV SOLN
500.0000 [IU] | Freq: Once | INTRAVENOUS | Status: DC | PRN
  Filled 2014-08-20: qty 5

## 2014-08-20 MED ORDER — ATROPINE SULFATE 1 MG/ML IJ SOLN
INTRAMUSCULAR | Status: AC
  Filled 2014-08-20: qty 1

## 2014-08-20 MED ORDER — DEXAMETHASONE SODIUM PHOSPHATE 20 MG/5ML IJ SOLN
20.0000 mg | Freq: Once | INTRAMUSCULAR | Status: AC
  Administered 2014-08-20: 20 mg via INTRAVENOUS

## 2014-08-20 MED ORDER — LEUCOVORIN CALCIUM INJECTION 350 MG
400.0000 mg/m2 | Freq: Once | INTRAVENOUS | Status: AC
  Administered 2014-08-20: 768 mg via INTRAVENOUS
  Filled 2014-08-20: qty 38.4

## 2014-08-20 MED ORDER — SODIUM CHLORIDE 0.9 % IJ SOLN
10.0000 mL | INTRAMUSCULAR | Status: DC | PRN
  Filled 2014-08-20: qty 10

## 2014-08-20 MED ORDER — SODIUM CHLORIDE 0.9 % IV SOLN
2400.0000 mg/m2 | INTRAVENOUS | Status: DC
  Administered 2014-08-20: 4600 mg via INTRAVENOUS
  Filled 2014-08-20: qty 92

## 2014-08-20 MED ORDER — IRINOTECAN HCL CHEMO INJECTION 100 MG/5ML
180.0000 mg/m2 | Freq: Once | INTRAVENOUS | Status: AC
  Administered 2014-08-20: 346 mg via INTRAVENOUS
  Filled 2014-08-20: qty 17.3

## 2014-08-20 NOTE — Progress Notes (Signed)
1420 discharged with spouse in no distress.

## 2014-08-20 NOTE — Patient Instructions (Signed)
[facility] Discharge Instructions for Patients Receiving Chemotherapy  Today you received the following chemotherapy agents Leucovorin, Irinotecan, 5FU/Flourouracil.  To help prevent nausea and vomiting after your treatment, we encourage you to take your nausea medication Compazine 10 mg. Zofran 4 Mg as ordered by Dr. Benay Spice.    If you develop nausea and vomiting that is not controlled by your nausea medication, call the clinic.   BELOW ARE SYMPTOMS THAT SHOULD BE REPORTED IMMEDIATELY:  *FEVER GREATER THAN 100.5 F  *CHILLS WITH OR WITHOUT FEVER  NAUSEA AND VOMITING THAT IS NOT CONTROLLED WITH YOUR NAUSEA MEDICATION  *UNUSUAL SHORTNESS OF BREATH  *UNUSUAL BRUISING OR BLEEDING  TENDERNESS IN MOUTH AND THROAT WITH OR WITHOUT PRESENCE OF ULCERS  *URINARY PROBLEMS  *BOWEL PROBLEMS  UNUSUAL RASH Items with * indicate a potential emergency and should be followed up as soon as possible.  Feel free to call the clinic you have any questions or concerns. The clinic phone number is [phone].

## 2014-08-20 NOTE — Progress Notes (Signed)
VSS with C/o dizziness at 11:35 am.

## 2014-08-20 NOTE — Progress Notes (Signed)
  [place] OFFICE PROGRESS NOTE   Diagnosis:  Gastric cancer  INTERVAL HISTORY:   Ms. Brau returns as scheduled. Cycle 2 FOLFIRI was held 08/12/2014 due to neutropenia. She is feeling better overall. She continues to have intermittent nausea/vomiting. She notes some relief with Compazine. No mouth sores. No recent loose stools. She denies any bleeding. Specifically no further blood clots with emesis. She feels her pain is controlled with oxycodone. She estimates taking the oxycodone 3 times a day.  Objective:  Vital signs in last 24 hours:  Blood pressure 148/84, pulse 67, temperature 98.2 F (36.8 C), temperature source Oral, resp. rate 17, height 5' (1.524 m), weight 185 lb 6.4 oz (84.097 kg), SpO2 99 %.    HEENT: No thrush or ulcers. Mucous membranes are moist. Resp: Lungs clear bilaterally. Cardio: Regular rate and rhythm. GI: Abdomen is soft. No hepatomegaly. Tenderness at the umbilicus and just superior. Palpable mass with associated tenderness superior to the midline abdominal scar. Vascular: No leg edema.  Port-A-Cath without erythema.    Lab Results:  Lab Results  Component Value Date   WBC 1.7* 08/12/2014   HGB 9.1* 08/12/2014   HCT 29.3* 08/12/2014   MCV 92.7 08/12/2014   PLT 185 08/12/2014   NEUTROABS 0.6* 08/12/2014    Imaging:  No results found.  Medications: I have reviewed the patient's current medications.  Assessment/Plan: 1. Stage IV gastric cancer  Status post bilateral oophorectomy, total abdominal hysterectomy, omentectomy, resection of abdominal wall and pelvic masses, bilateral pelvic lymphadenectomy 10/18/2013 with the pathology confirming moderate to poorly differentiated adenocarcinoma  Stomach biopsy 11/28/2013 revealed adenocarcinoma  Status post 12 cycles of FOLFOX, last given 04/25/2014  Restaging CT 04/29/2014 with thickening of the stomach and nodular densities adjacent to the greater curvature-unchanged,  faint groundglass nodularity in the lungs-nonspecific  Restaging CT 07/22/2014 with indeterminate lung nodules; moderate wall thickening in the body region of the stomach; fairly extensive omental disease and findings suspicious for abdominal wall and incisional disease.  Started systemic chemotherapy with FOLFIRI on 07/30/14.  2. Oxaliplatin neuropathy  3. Bilateral upper and lower lobe pulmonary emboli within segmental branches-now maintained on Coumadin. Followed by Coumadin clinic per PCP.  4. Neutropenia related to chemotherapy 08/13/2014. Cycle 2 FOLFOX held. Neulasta added beginning with cycle 2 on 08/20/2014.   Disposition: She appears stable. The white count has recovered. Plan to proceed with cycle 2 FOLFIRI today as scheduled. She will receive Neulasta on the day of pump discontinuation. We reviewed potential toxicities including bone pain, rash, splenic rupture. She will return for a follow-up visit and cycle 3 in 2 weeks. She will contact the office in the interim with any problems.    Ned Card ANP/GNP-BC   08/20/2014  10:03 AM

## 2014-08-20 NOTE — Progress Notes (Signed)
At provider request, provided samples of Ensure Plus. Patient is a 54 year old gastric cancer patient of Dr. Benay Spice.   Patient has lost 7 pounds. Provided brief education on the importance of increasing calories and protein with supplements between meals.   Provided contact information with name and phone number.   Encouraged patient to contact me for any nutrition needs.  **Disclaimer: This note was dictated with voice recognition software. Similar sounding words can inadvertently be transcribed and this note may contain transcription errors which may not have been corrected upon publication of note.**

## 2014-08-20 NOTE — Telephone Encounter (Signed)
gv adn printed appt sched and avs for pt for Jan and Feb 2016

## 2014-08-20 NOTE — Progress Notes (Signed)
11:35 patient C/O dizziness.  Denies feeling dizzy with first treatment or history of dizziness.  VSS.  Denies being on any new medications.  Has only received pre-medications thus far.  Asked that her seat heat be turned down from high.  Face flushed.  This nurse turned heat off.  Cool compresses to forehead and recliner placed back with head back and feet up. 11:49 feels better and agrees to proceed with treatment.  Color wnl.

## 2014-08-22 ENCOUNTER — Ambulatory Visit: Payer: Self-pay

## 2014-08-22 ENCOUNTER — Ambulatory Visit (HOSPITAL_BASED_OUTPATIENT_CLINIC_OR_DEPARTMENT_OTHER): Payer: Medicaid Other

## 2014-08-22 DIAGNOSIS — Z5189 Encounter for other specified aftercare: Secondary | ICD-10-CM

## 2014-08-22 DIAGNOSIS — C169 Malignant neoplasm of stomach, unspecified: Secondary | ICD-10-CM

## 2014-08-22 DIAGNOSIS — Z452 Encounter for adjustment and management of vascular access device: Secondary | ICD-10-CM

## 2014-08-22 MED ORDER — PEGFILGRASTIM INJECTION 6 MG/0.6ML ~~LOC~~
6.0000 mg | PREFILLED_SYRINGE | Freq: Once | SUBCUTANEOUS | Status: AC
  Administered 2014-08-22: 6 mg via SUBCUTANEOUS
  Filled 2014-08-22: qty 0.6

## 2014-08-22 MED ORDER — HEPARIN SOD (PORK) LOCK FLUSH 100 UNIT/ML IV SOLN
500.0000 [IU] | Freq: Once | INTRAVENOUS | Status: AC | PRN
  Administered 2014-08-22: 500 [IU]
  Filled 2014-08-22: qty 5

## 2014-08-22 MED ORDER — SODIUM CHLORIDE 0.9 % IJ SOLN
10.0000 mL | INTRAMUSCULAR | Status: DC | PRN
  Administered 2014-08-22: 10 mL
  Filled 2014-08-22: qty 10

## 2014-08-22 NOTE — Patient Instructions (Signed)
Pegfilgrastim injection What is this medicine? PEGFILGRASTIM (peg fil GRA stim) is a long-acting granulocyte colony-stimulating factor that stimulates the growth of neutrophils, a type of white blood cell important in the body's fight against infection. It is used to reduce the incidence of fever and infection in patients with certain types of cancer who are receiving chemotherapy that affects the bone marrow. This medicine may be used for other purposes; ask your health care provider or pharmacist if you have questions. COMMON BRAND NAME(S): Neulasta What should I tell my health care provider before I take this medicine? They need to know if you have any of these conditions: -latex allergy -ongoing radiation therapy -sickle cell disease -skin reactions to acrylic adhesives (On-Body Injector only) -an unusual or allergic reaction to pegfilgrastim, filgrastim, other medicines, foods, dyes, or preservatives -pregnant or trying to get pregnant -breast-feeding How should I use this medicine? This medicine is for injection under the skin. If you get this medicine at home, you will be taught how to prepare and give the pre-filled syringe or how to use the On-body Injector. Refer to the patient Instructions for Use for detailed instructions. Use exactly as directed. Take your medicine at regular intervals. Do not take your medicine more often than directed. It is important that you put your used needles and syringes in a special sharps container. Do not put them in a trash can. If you do not have a sharps container, call your pharmacist or healthcare provider to get one. Talk to your pediatrician regarding the use of this medicine in children. Special care may be needed. Overdosage: If you think you have taken too much of this medicine contact a poison control center or emergency room at once. NOTE: This medicine is only for you. Do not share this medicine with others. What if I miss a dose? It is  important not to miss your dose. Call your doctor or health care professional if you miss your dose. If you miss a dose due to an On-body Injector failure or leakage, a new dose should be administered as soon as possible using a single prefilled syringe for manual use. What may interact with this medicine? Interactions have not been studied. Give your health care provider a list of all the medicines, herbs, non-prescription drugs, or dietary supplements you use. Also tell them if you smoke, drink alcohol, or use illegal drugs. Some items may interact with your medicine. This list may not describe all possible interactions. Give your health care provider a list of all the medicines, herbs, non-prescription drugs, or dietary supplements you use. Also tell them if you smoke, drink alcohol, or use illegal drugs. Some items may interact with your medicine. What should I watch for while using this medicine? You may need blood work done while you are taking this medicine. If you are going to need a MRI, CT scan, or other procedure, tell your doctor that you are using this medicine (On-Body Injector only). What side effects may I notice from receiving this medicine? Side effects that you should report to your doctor or health care professional as soon as possible: -allergic reactions like skin rash, itching or hives, swelling of the face, lips, or tongue -dizziness -fever -pain, redness, or irritation at site where injected -pinpoint red spots on the skin -shortness of breath or breathing problems -stomach or side pain, or pain at the shoulder -swelling -tiredness -trouble passing urine Side effects that usually do not require medical attention (report to your doctor   or health care professional if they continue or are bothersome): -bone pain -muscle pain This list may not describe all possible side effects. Call your doctor for medical advice about side effects. You may report side effects to FDA at  1-800-FDA-1088. Where should I keep my medicine? Keep out of the reach of children. Store pre-filled syringes in a refrigerator between 2 and 8 degrees C (36 and 46 degrees F). Do not freeze. Keep in carton to protect from light. Throw away this medicine if it is left out of the refrigerator for more than 48 hours. Throw away any unused medicine after the expiration date. NOTE: This sheet is a summary. It may not cover all possible information. If you have questions about this medicine, talk to your doctor, pharmacist, or health care provider.  2015, Elsevier/Gold Standard. (2013-11-01 16:14:05) Fluorouracil, 5FU; Diclofenac topical cream What is this medicine? FLUOROURACIL; DICLOFENAC (flure oh YOOR a sil; dye KLOE fen ak) is a combination of a topical chemotherapy agent and non-steroidal anti-inflammatory drug (NSAID). It is used on the skin to treat skin cancer and skin conditions that could become cancer. This medicine may be used for other purposes; ask your health care provider or pharmacist if you have questions. COMMON BRAND NAME(S): FLUORAC What should I tell my health care provider before I take this medicine? They need to know if you have any of these conditions: -bleeding problems -cigarette smoker -DPD enzyme deficiency -heart disease -high blood pressure -if you frequently drink alcohol containing drinks -kidney disease -liver disease -open or infected skin -stomach problems -swelling or open sores at the treatment site -recent or planned coronary artery bypass graft (CABG) surgery -an unusual or allergic reaction to fluorouracil, diclofenac, aspirin, other NSAIDs, other medicines, foods, dyes, or preservatives -pregnant or trying to get pregnant -breast-feeding How should I use this medicine? This medicine is only for use on the skin. Follow the directions on the prescription label. Wash hands before and after use. Wash affected area and gently pat dry. To apply this  medicine use a cotton-tipped applicator, or use gloves if applying with fingertips. If applied with unprotected fingertips, it is very important to wash your hands well after you apply this medicine. Avoid applying to the eyes, nose, or mouth. Apply enough medicine to cover the affected area. You can cover the area with a light gauze dressing, but do not use tight or air-tight dressings. Finish the full course prescribed by your doctor or health care professional, even if you think your condition is better. Do not stop taking except on the advice of your doctor or health care professional. Talk to your pediatrician regarding the use of this medicine in children. Special care may be needed. Overdosage: If you think you've taken too much of this medicine contact a poison control center or emergency room at once. Overdosage: If you think you have taken too much of this medicine contact a poison control center or emergency room at once. NOTE: This medicine is only for you. Do not share this medicine with others. What if I miss a dose? If you miss a dose, apply it as soon as you can. If it is almost time for your next dose, only use that dose. Do not apply extra doses. Contact your doctor or health care professional if you miss more than one dose. What may interact with this medicine? Interactions are not expected. Do not use any other skin products without telling your doctor or health care professional. This list   may not describe all possible interactions. Give your health care provider a list of all the medicines, herbs, non-prescription drugs, or dietary supplements you use. Also tell them if you smoke, drink alcohol, or use illegal drugs. Some items may interact with your medicine. What should I watch for while using this medicine? Visit your doctor or health care professional for checks on your progress. You will need to use this medicine for 2 to 6 weeks. This may be longer depending on the condition  being treated. You may not see full healing for another 1 to 2 months after you stop using the medicine. Treated areas of skin can look unsightly during and for several weeks after treatment with this medicine. This medicine can make you more sensitive to the sun. Keep out of the sun. If you cannot avoid being in the sun, wear protective clothing and use sunscreen. Do not use sun lamps or tanning beds/booths. Where should I keep my What side effects may I notice from receiving this medicine? Side effects that you should report to your doctor or health care professional as soon as possible: -allergic reactions like skin rash, itching or hives, swelling of the face, lips, or tongue -black or bloody stools, blood in the urine or vomit -blurred vision -chest pain -difficulty breathing or wheezing -redness, blistering, peeling or loosening of the skin, including inside the mouth -severe redness and swelling of normal skin -slurred speech or weakness on one side of the body -trouble passing urine or change in the amount of urine -unexplained weight gain or swelling -unusually weak or tired -yellowing of eyes or skin Side effects that usually do not require medical attention (Report these to your doctor or health care professional if they continue or are bothersome.): -increased sensitivity of the skin to sun and ultraviolet light -pain and burning of the affected area -scaling or swelling of the affected area -skin rash, itching of the affected area -tenderness This list may not describe all possible side effects. Call your doctor for medical advice about side effects. You may report side effects to FDA at 1-800-FDA-1088. Where should I keep my medicine? Keep out of the reach of children. Store at room temperature between 20 and 25 degrees C (68 and 77 degrees F). Throw away any unused medicine after the expiration date. NOTE: This sheet is a summary. It may not cover all possible information.  If you have questions about this medicine, talk to your doctor, pharmacist, or health care provider.  2015, Elsevier/Gold Standard. (2013-12-03 11:09:58)  

## 2014-08-26 ENCOUNTER — Ambulatory Visit: Payer: Medicaid Other | Attending: Family Medicine | Admitting: Pharmacist

## 2014-08-26 ENCOUNTER — Other Ambulatory Visit: Payer: Self-pay

## 2014-08-26 ENCOUNTER — Inpatient Hospital Stay: Payer: Self-pay

## 2014-08-26 ENCOUNTER — Ambulatory Visit: Payer: Self-pay | Admitting: Nurse Practitioner

## 2014-08-26 VITALS — Ht 60.0 in | Wt 180.0 lb

## 2014-08-26 DIAGNOSIS — I2699 Other pulmonary embolism without acute cor pulmonale: Secondary | ICD-10-CM

## 2014-08-26 DIAGNOSIS — Z7901 Long term (current) use of anticoagulants: Secondary | ICD-10-CM

## 2014-08-26 LAB — POCT INR: INR: 1.1

## 2014-08-26 MED ORDER — WARFARIN SODIUM 4 MG PO TABS: 8.0000 mg | ORAL_TABLET | Freq: Every day | ORAL | Status: DC

## 2014-09-01 ENCOUNTER — Other Ambulatory Visit: Payer: Self-pay | Admitting: Oncology

## 2014-09-02 ENCOUNTER — Ambulatory Visit: Payer: Medicaid Other | Attending: Family Medicine | Admitting: Pharmacist

## 2014-09-02 DIAGNOSIS — I2699 Other pulmonary embolism without acute cor pulmonale: Secondary | ICD-10-CM

## 2014-09-02 DIAGNOSIS — Z7901 Long term (current) use of anticoagulants: Secondary | ICD-10-CM

## 2014-09-02 LAB — POCT INR: INR: 1.6

## 2014-09-03 ENCOUNTER — Encounter: Payer: Self-pay | Admitting: *Deleted

## 2014-09-03 ENCOUNTER — Ambulatory Visit (HOSPITAL_BASED_OUTPATIENT_CLINIC_OR_DEPARTMENT_OTHER): Payer: Medicaid Other

## 2014-09-03 ENCOUNTER — Other Ambulatory Visit (HOSPITAL_BASED_OUTPATIENT_CLINIC_OR_DEPARTMENT_OTHER): Payer: Medicaid Other

## 2014-09-03 ENCOUNTER — Ambulatory Visit (HOSPITAL_BASED_OUTPATIENT_CLINIC_OR_DEPARTMENT_OTHER): Payer: Medicaid Other | Admitting: Nurse Practitioner

## 2014-09-03 VITALS — BP 130/72 | HR 80 | Temp 98.2°F | Resp 18 | Ht 60.0 in | Wt 183.8 lb

## 2014-09-03 DIAGNOSIS — Z7901 Long term (current) use of anticoagulants: Secondary | ICD-10-CM

## 2014-09-03 DIAGNOSIS — G620 Drug-induced polyneuropathy: Secondary | ICD-10-CM

## 2014-09-03 DIAGNOSIS — R112 Nausea with vomiting, unspecified: Secondary | ICD-10-CM

## 2014-09-03 DIAGNOSIS — R918 Other nonspecific abnormal finding of lung field: Secondary | ICD-10-CM

## 2014-09-03 DIAGNOSIS — I2699 Other pulmonary embolism without acute cor pulmonale: Secondary | ICD-10-CM

## 2014-09-03 DIAGNOSIS — Z598 Other problems related to housing and economic circumstances: Secondary | ICD-10-CM

## 2014-09-03 DIAGNOSIS — C169 Malignant neoplasm of stomach, unspecified: Secondary | ICD-10-CM

## 2014-09-03 DIAGNOSIS — Z599 Problem related to housing and economic circumstances, unspecified: Secondary | ICD-10-CM

## 2014-09-03 DIAGNOSIS — T451X5A Adverse effect of antineoplastic and immunosuppressive drugs, initial encounter: Secondary | ICD-10-CM

## 2014-09-03 DIAGNOSIS — K5900 Constipation, unspecified: Secondary | ICD-10-CM

## 2014-09-03 DIAGNOSIS — Z5111 Encounter for antineoplastic chemotherapy: Secondary | ICD-10-CM | POA: Diagnosis present

## 2014-09-03 DIAGNOSIS — G893 Neoplasm related pain (acute) (chronic): Secondary | ICD-10-CM

## 2014-09-03 LAB — COMPREHENSIVE METABOLIC PANEL (CC13)
ALK PHOS: 153 U/L — AB (ref 40–150)
ALT: 10 U/L (ref 0–55)
AST: 15 U/L (ref 5–34)
Albumin: 3.7 g/dL (ref 3.5–5.0)
Anion Gap: 9 mEq/L (ref 3–11)
BUN: 10.9 mg/dL (ref 7.0–26.0)
CHLORIDE: 104 meq/L (ref 98–109)
CO2: 28 meq/L (ref 22–29)
CREATININE: 0.7 mg/dL (ref 0.6–1.1)
Calcium: 8.9 mg/dL (ref 8.4–10.4)
EGFR: >90 mL/min/{1.73_m2} (ref >90)
GLUCOSE: 106 mg/dL (ref 70–140)
POTASSIUM: 3.5 meq/L (ref 3.5–5.1)
Sodium: 141 mEq/L (ref 136–145)
TOTAL PROTEIN: 6.5 g/dL (ref 6.4–8.3)
Total Bilirubin: 0.75 mg/dL (ref 0.20–1.20)

## 2014-09-03 LAB — PROTIME-INR
INR: 1.4 — AB (ref 2.00–3.50)
PROTIME: 16.8 s — AB (ref 10.6–13.4)

## 2014-09-03 LAB — CBC WITH DIFFERENTIAL/PLATELET
BASO%: 0.1 % (ref 0.0–2.0)
Basophils Absolute: 0 10*3/uL (ref 0.0–0.1)
EOS ABS: 0.1 10*3/uL (ref 0.0–0.5)
EOS%: 0.8 % (ref 0.0–7.0)
HCT: 30.7 % — ABNORMAL LOW (ref 34.8–46.6)
HGB: 9.6 g/dL — ABNORMAL LOW (ref 11.6–15.9)
LYMPH%: 17 % (ref 14.0–49.7)
MCH: 28.8 pg (ref 25.1–34.0)
MCHC: 31.3 g/dL — ABNORMAL LOW (ref 31.5–36.0)
MCV: 92.2 fL (ref 79.5–101.0)
MONO#: 0.8 10*3/uL (ref 0.1–0.9)
MONO%: 9.2 % (ref 0.0–14.0)
NEUT#: 6.6 10*3/uL — ABNORMAL HIGH (ref 1.5–6.5)
NEUT%: 72.9 % (ref 38.4–76.8)
Platelets: 240 10*3/uL (ref 145–400)
RBC: 3.33 10*6/uL — AB (ref 3.70–5.45)
RDW: 17 % — AB (ref 11.2–14.5)
WBC: 9.1 10*3/uL (ref 3.9–10.3)
lymph#: 1.6 10*3/uL (ref 0.9–3.3)

## 2014-09-03 MED ORDER — FLUOROURACIL CHEMO INJECTION 2.5 GM/50ML
400.0000 mg/m2 | Freq: Once | INTRAVENOUS | Status: AC
  Administered 2014-09-03: 750 mg via INTRAVENOUS
  Filled 2014-09-03: qty 15

## 2014-09-03 MED ORDER — FLUOROURACIL CHEMO INJECTION 5 GM/100ML
2400.0000 mg/m2 | INTRAVENOUS | Status: DC
  Administered 2014-09-03: 4600 mg via INTRAVENOUS
  Filled 2014-09-03: qty 92

## 2014-09-03 MED ORDER — OXYCODONE HCL 5 MG PO TABS: ORAL_TABLET | ORAL | Status: DC

## 2014-09-03 MED ORDER — SODIUM CHLORIDE 0.9 % IV SOLN
Freq: Once | INTRAVENOUS | Status: AC
  Administered 2014-09-03: 13:00:00 via INTRAVENOUS

## 2014-09-03 MED ORDER — ACETAMINOPHEN 325 MG PO TABS
650.0000 mg | ORAL_TABLET | Freq: Once | ORAL | Status: AC
  Administered 2014-09-03: 650 mg via ORAL

## 2014-09-03 MED ORDER — PROCHLORPERAZINE MALEATE 10 MG PO TABS: 10.0000 mg | ORAL_TABLET | Freq: Four times a day (QID) | ORAL | Status: DC | PRN

## 2014-09-03 MED ORDER — ONDANSETRON 16 MG/50ML IVPB (CHCC)
16.0000 mg | Freq: Once | INTRAVENOUS | Status: AC
  Administered 2014-09-03: 16 mg via INTRAVENOUS

## 2014-09-03 MED ORDER — IRINOTECAN HCL CHEMO INJECTION 100 MG/5ML
180.0000 mg/m2 | Freq: Once | INTRAVENOUS | Status: AC
  Administered 2014-09-03: 346 mg via INTRAVENOUS
  Filled 2014-09-03: qty 17.3

## 2014-09-03 MED ORDER — DEXAMETHASONE SODIUM PHOSPHATE 20 MG/5ML IJ SOLN
20.0000 mg | Freq: Once | INTRAMUSCULAR | Status: AC
  Administered 2014-09-03: 20 mg via INTRAVENOUS

## 2014-09-03 MED ORDER — DEXAMETHASONE SODIUM PHOSPHATE 20 MG/5ML IJ SOLN
INTRAMUSCULAR | Status: AC
Start: 2014-09-03 — End: 2014-09-03
  Filled 2014-09-03: qty 5

## 2014-09-03 MED ORDER — ONDANSETRON 16 MG/50ML IVPB (CHCC)
INTRAVENOUS | Status: AC
  Filled 2014-09-03: qty 16

## 2014-09-03 MED ORDER — ACETAMINOPHEN 325 MG PO TABS
ORAL_TABLET | ORAL | Status: AC
  Filled 2014-09-03: qty 2

## 2014-09-03 MED ORDER — LEUCOVORIN CALCIUM INJECTION 350 MG
400.0000 mg/m2 | Freq: Once | INTRAVENOUS | Status: AC
  Administered 2014-09-03: 768 mg via INTRAVENOUS
  Filled 2014-09-03: qty 38.4

## 2014-09-03 NOTE — Progress Notes (Signed)
1615-Pt requesting Tylenol prior to discharge for headache.  Dr. Benay Spice notified and order received for Tylenol 650 mg PO.

## 2014-09-03 NOTE — Progress Notes (Signed)
[place] Work  Clinical Social Work was referred by Therapist, sports for financial concerns and assessment of psychosocial needs.  Clinical Social Worker met with patient, patients husband, and interpreter in the infusion room at [facility] to offer support and assess for needs.  Patient informed CSW that the water and power had been disconnected in their home because they were unable to pay the bill.  CSW and patient discussed resources and the [employer].  Patient was previously approved for a 100% discount for [facility], and application/income information was available in her EMR.  (Patient stated she started receiving Medicaid benefits 3 weeks ago)  CSW made referral to Financial Advocate to determine if patient would qualify for [employer], and provided all household income information.  Patient did not have bills with her at this visit.  CSw inquired to financial advocate about alternate avenues for paying the power bill.  Patient stated the water bill was in her husbands name and the power bill was in her daughters name.  Patients children and young grandchildren are also living in the home (total of 9 people).  Patient stated they do not have heat in the home due to unpaid bill, and stated the water had been turned off over a week ago and power was turned off 3 days ago.  Patient stated her daughter did receive benefits for the young children including food stamps.  CSw informed financial advocate with information provided by patient and urgency/need of the [place].  Financial advocate stated she planned to meet with patient today to determine if she qualifies for the grant and possibilities of paying outstanding bills.  CSW will continue to follow and support.    Johnnye Lana, MSW, LCSW, OSW-C Clinical Social Worker [place] [phone]

## 2014-09-03 NOTE — Patient Instructions (Signed)
[facility] Discharge Instructions for Patients Receiving Chemotherapy  Today you received the following chemotherapy agents Camptosar, Leucovorin and 5FU.  To help prevent nausea and vomiting after your treatment, we encourage you to take your nausea medication.   If you develop nausea and vomiting that is not controlled by your nausea medication, call the clinic.   BELOW ARE SYMPTOMS THAT SHOULD BE REPORTED IMMEDIATELY:  *FEVER GREATER THAN 100.5 F  *CHILLS WITH OR WITHOUT FEVER  NAUSEA AND VOMITING THAT IS NOT CONTROLLED WITH YOUR NAUSEA MEDICATION  *UNUSUAL SHORTNESS OF BREATH  *UNUSUAL BRUISING OR BLEEDING  TENDERNESS IN MOUTH AND THROAT WITH OR WITHOUT PRESENCE OF ULCERS  *URINARY PROBLEMS  *BOWEL PROBLEMS  UNUSUAL RASH Items with * indicate a potential emergency and should be followed up as soon as possible.  Feel free to call the clinic you have any questions or concerns. The clinic phone number is [phone].

## 2014-09-04 ENCOUNTER — Encounter: Payer: Self-pay | Admitting: Nurse Practitioner

## 2014-09-04 DIAGNOSIS — G620 Drug-induced polyneuropathy: Secondary | ICD-10-CM | POA: Insufficient documentation

## 2014-09-04 DIAGNOSIS — Z7901 Long term (current) use of anticoagulants: Secondary | ICD-10-CM | POA: Insufficient documentation

## 2014-09-04 DIAGNOSIS — Z599 Problem related to housing and economic circumstances, unspecified: Secondary | ICD-10-CM | POA: Insufficient documentation

## 2014-09-04 DIAGNOSIS — R112 Nausea with vomiting, unspecified: Secondary | ICD-10-CM | POA: Insufficient documentation

## 2014-09-04 DIAGNOSIS — K5900 Constipation, unspecified: Secondary | ICD-10-CM

## 2014-09-04 DIAGNOSIS — T451X5A Adverse effect of antineoplastic and immunosuppressive drugs, initial encounter: Secondary | ICD-10-CM

## 2014-09-04 DIAGNOSIS — G893 Neoplasm related pain (acute) (chronic): Secondary | ICD-10-CM | POA: Insufficient documentation

## 2014-09-04 DIAGNOSIS — Z598 Other problems related to housing and economic circumstances: Secondary | ICD-10-CM | POA: Insufficient documentation

## 2014-09-04 HISTORY — DX: Constipation, unspecified: K59.00

## 2014-09-04 NOTE — Progress Notes (Signed)
SYMPTOM MANAGEMENT CLINIC   HPI: Alicia [name] 54 y.o. female diagnosed with gastric cancer.  Currently undergoing Folfiri chemotherapy regimen.  Patient presents to the [facility] today to receive cycle 3 of her chemotherapy regimen.  She states she's been doing fairly well recently.  She reports experiencing some nausea/vomiting for the first week following each chemotherapy; but that it does clear after one week.  She continues with some mild neuropathy to all extremities; but feels that this is stable at present.  She denies any diarrhea issues.  She is complaining of some intermittent constipation; but is not on a bowel regimen.  Patient denies any recent fevers or chills.  She states that her pain medication use has greatly decreased; and she is only taking approximately 3 of the oxycodone per day.  She is requesting a refill of the oxycodone today.  She continues to take Coumadin for previously diagnosed pulmonary embolism.  Also, patient and her husband report that they are now living in a home with no electricity/heat or running water.   Spanish translator was in the room for all of exam.  HPI  CURRENT THERAPY: Upcoming Treatment Dates - COLORECTAL FOLFIRI q14d Days with orders from any treatment category:  09/05/2014      SCHEDULING COMMUNICATION      pegfilgrastim (NEULASTA) injection 6 mg      sodium chloride 0.9 % injection 10 mL      heparin lock flush 100 unit/mL      heparin lock flush 100 unit/mL      alteplase (CATHFLO ACTIVASE) injection 2 mg      sodium chloride 0.9 % injection 3 mL      Cold Pack 1 packet 09/18/2014      SCHEDULING COMMUNICATION      ondansetron (ZOFRAN) IVPB 16 mg      Dexamethasone Sodium Phosphate (DECADRON) injection 20 mg      irinotecan (CAMPTOSAR) 346 mg in dextrose 5 % 500 mL chemo infusion      leucovorin 768 mg in dextrose 5 % 250 mL infusion      fluorouracil (ADRUCIL) chemo injection 750 mg      fluorouracil (ADRUCIL) 4,600 mg  in sodium chloride 0.9 % 150 mL chemo infusion      atropine injection 0.5 mg      sodium chloride 0.9 % injection 10 mL      heparin lock flush 100 unit/mL      heparin lock flush 100 unit/mL      alteplase (CATHFLO ACTIVASE) injection 2 mg      sodium chloride 0.9 % injection 3 mL      Cold Pack 1 packet      0.9 %  sodium chloride infusion      TREATMENT CONDITIONS 09/20/2014      pegfilgrastim (NEULASTA) injection 6 mg      SCHEDULING COMMUNICATION      sodium chloride 0.9 % injection 10 mL      heparin lock flush 100 unit/mL      heparin lock flush 100 unit/mL      alteplase (CATHFLO ACTIVASE) injection 2 mg      sodium chloride 0.9 % injection 3 mL      Cold Pack 1 packet    ROS  Past Medical History  Diagnosis Date  . 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 Abdominal hysterectomy  10/18/2013  has Depression; Pulmonary nodules/lesions, multiple; PE (pulmonary embolism); Anticoagulated on Coumadin; Gastric cancer; Neoplasm related pain; Neuropathy due to chemotherapeutic drug; Long term current use of anticoagulant therapy; Nausea with vomiting; Constipation; and Financial difficulties on her problem list.    is allergic to xarelto.    Medication List       This list is accurate as of: 09/03/14 11:59 PM.  Always use your most recent med list.               ondansetron 4 MG tablet  Commonly known as:  ZOFRAN  Take 1 tablet (4 mg total) by mouth every 6 (six) hours.     oxyCODONE 5 MG immediate release tablet  Commonly known as:  ROXICODONE  Take 1 -2 tabs PO Q 4-6 hours PRN pain.     prochlorperazine 10 MG tablet  Commonly known as:  COMPAZINE  Take 1 tablet (10 mg total) by mouth every 6 (six) hours as needed for nausea or vomiting.     sertraline 100 MG tablet  Commonly known as:  ZOLOFT  Take 1 tablet (100 mg total) by mouth daily.     warfarin 4 MG tablet  Commonly known as:   COUMADIN  Take 2 tablets (8 mg total) by mouth daily.         PHYSICAL EXAMINATION  Blood pressure 130/72, pulse 80, temperature 98.2 F (36.8 C), temperature source Oral, resp. rate 18, height 5' (1.524 m), weight 183 lb 12.8 oz (83.371 kg), SpO2 100 %.  Physical Exam  Constitutional: She is oriented to person, place, and time and well-developed, well-nourished, and in no distress.  HENT:  Head: Normocephalic and atraumatic.  Mouth/Throat: Oropharynx is clear and moist.  Eyes: Conjunctivae and EOM are normal. Pupils are equal, round, and reactive to light. Right eye exhibits no discharge. Left eye exhibits no discharge. No scleral icterus.  Neck: Normal range of motion. Neck supple. No JVD present. No tracheal deviation present. No thyromegaly present.  Cardiovascular: Normal rate, regular rhythm, normal heart sounds and intact distal pulses.   Pulmonary/Chest: Effort normal and breath sounds normal. No respiratory distress. She has no wheezes. She has no rales. She exhibits no tenderness.  Abdominal: Soft. Bowel sounds are normal. She exhibits no distension and no mass. There is no tenderness. There is no rebound and no guarding.  Musculoskeletal: Normal range of motion. She exhibits no edema or tenderness.  Lymphadenopathy:    She has no cervical adenopathy.  Neurological: She is alert and oriented to person, place, and time. Gait normal.  Skin: Skin is warm and dry. No rash noted. No erythema.  Psychiatric: Affect normal.  Nursing note and vitals reviewed.   LABORATORY DATA:. Appointment on 09/03/2014  Component Date Value Ref Range Status  . WBC 09/03/2014 9.1  3.9 - 10.3 10e3/uL Final  . NEUT# 09/03/2014 6.6* 1.5 - 6.5 10e3/uL Final  . HGB 09/03/2014 9.6* 11.6 - 15.9 g/dL Final  . HCT 09/03/2014 30.7* 34.8 - 46.6 % Final  . Platelets 09/03/2014 240  145 - 400 10e3/uL Final  . MCV 09/03/2014 92.2  79.5 - 101.0 fL Final  . MCH 09/03/2014 28.8  25.1 - 34.0 pg Final  . MCHC  09/03/2014 31.3* 31.5 - 36.0 g/dL Final  . RBC 09/03/2014 3.33* 3.70 - 5.45 10e6/uL Final  . RDW 09/03/2014 17.0* 11.2 - 14.5 % Final  . lymph# 09/03/2014 1.6  0.9 - 3.3 10e3/uL Final  . MONO# 09/03/2014 0.8  0.1 - 0.9  10e3/uL Final  . Eosinophils Absolute 09/03/2014 0.1  0.0 - 0.5 10e3/uL Final  . Basophils Absolute 09/03/2014 0.0  0.0 - 0.1 10e3/uL Final  . NEUT% 09/03/2014 72.9  38.4 - 76.8 % Final  . LYMPH% 09/03/2014 17.0  14.0 - 49.7 % Final  . MONO% 09/03/2014 9.2  0.0 - 14.0 % Final  . EOS% 09/03/2014 0.8  0.0 - 7.0 % Final  . BASO% 09/03/2014 0.1  0.0 - 2.0 % Final  . Sodium 09/03/2014 141  136 - 145 mEq/L Final  . Potassium 09/03/2014 3.5  3.5 - 5.1 mEq/L Final  . Chloride 09/03/2014 104  98 - 109 mEq/L Final  . CO2 09/03/2014 28  22 - 29 mEq/L Final  . Glucose 09/03/2014 106  70 - 140 mg/dl Final  . BUN 09/03/2014 10.9  7.0 - 26.0 mg/dL Final  . Creatinine 09/03/2014 0.7  0.6 - 1.1 mg/dL Final  . Total Bilirubin 09/03/2014 0.75  0.20 - 1.20 mg/dL Final  . Alkaline Phosphatase 09/03/2014 153* 40 - 150 U/L Final  . AST 09/03/2014 15  5 - 34 U/L Final  . ALT 09/03/2014 10  0 - 55 U/L Final  . Total Protein 09/03/2014 6.5  6.4 - 8.3 g/dL Final  . Albumin 09/03/2014 3.7  3.5 - 5.0 g/dL Final  . Calcium 09/03/2014 8.9  8.4 - 10.4 mg/dL Final  . Anion Gap 09/03/2014 9  3 - 11 mEq/L Final  . EGFR 09/03/2014 >90  >90 ml/min/1.73 m2 Final   eGFR is calculated using the CKD-EPI Creatinine Equation (2009)  . Protime 09/03/2014 16.8* 10.6 - 13.4 Seconds Final  . INR 09/03/2014 1.40* 2.00 - 3.50 Final   Comment: INR is useful only to assess adequacy of anticoagulation with coumadin when comparing results from different labs. It should not be used to estimate bleeding risk or presence/abscense of coagulopathy in patients not on coumadin. Expected INR ranges for  nontherapeutic patients is 0.88 - 1.12.   Marland Kitchen Lovenox 09/03/2014 No   Final  Anti-coag visit on 09/02/2014  Component Date  Value Ref Range Status  . INR 09/02/2014 1.6   Final     RADIOGRAPHIC STUDIES: No results found.  ASSESSMENT/PLAN:    Constipation Patient complaining of some mild, but chronic constipation issues.  Most likely, this is secondary to use of narcotic pain medication.  Long discussion with both patient and her husband via translator regarding the need for a bowel regimen.  Advised patient to use stool softeners twice daily; and Maalox every 4-6 hours until constipation clears.  Following clearing of constipation-patient should continue with stool softeners twice daily and Maalox once a day.  Also gave written instructions for the patient to take home as well.   Financial difficulties Patient states that she is living in a home with no electricity, heat, or water.  Have arranged for cancer Center social worker to visit with both patient and her husband while they are in the infusion area today in hopes they will be able to assist with these financial difficulties today.   Gastric cancer Cycle 2 of patient's Folfiri chemotherapy was delayed by one week due to neutropenia issues.  Patient received cycle 2 of her chemotherapy on [place] fifth 2016.  After careful review of patient's blood counts with patient and her husband today-patient will be able to proceed with cycle 3 of her chemotherapy today.  We have added Neulasta to her regimen to prevent any further issues with neutropenia.  Patient has  plans to return on 09/17/2016 for labs/visit/cycle 4 of her chemotherapy.   Long term current use of anticoagulant therapy Patient has a history of pulmonary embolisms; and is currently taking Coumadin which is managed by the Coumadin clinic.   Nausea with vomiting Patient states that she does experience some nausea and vomiting within the first week following each chemotherapy.  She states that all nausea/vomiting has since cleared.  She is confirmed that she does have antiemetics at home to use as  needed.   Neoplasm related pain Patient states that she has been able to decrease her oxycodone use down to only approximately 3 oxycodone tablets per day.  She is requesting a refill of oxycodone today as well.   Neuropathy due to chemotherapeutic drug Patient continues complaining of some mild neuropathy to all extremities.  She feels that this neuropathy is stable at present.  Most likely, neuropathy is secondary to previous oxaliplatin.   Patient stated understanding of all instructions; and was in agreement with this plan of care. The patient knows to call the clinic with any problems, questions or concerns.   Review/collaboration with Dr. Benay Spice regarding all aspects of patient's visit today.   Total time spent with patient was 25 minutes;  with greater than 75 percent of that time spent in face to face counseling regarding her symptoms, and coordination of care and follow up.  Disclaimer: This note was dictated with voice recognition software. Similar sounding words can inadvertently be transcribed and may not be corrected upon review.   Drue Second, NP 09/04/2014

## 2014-09-04 NOTE — Assessment & Plan Note (Signed)
Cycle 2 of patient's Folfiri chemotherapy was delayed by one week due to neutropenia issues.  Patient received cycle 2 of her chemotherapy on [place] fifth 2016.  After careful review of patient's blood counts with patient and her husband today-patient will be able to proceed with cycle 3 of her chemotherapy today.  We have added Neulasta to her regimen to prevent any further issues with neutropenia.  Patient has plans to return on 09/17/2016 for labs/visit/cycle 4 of her chemotherapy.

## 2014-09-04 NOTE — Assessment & Plan Note (Signed)
Patient states that she is living in a home with no electricity, heat, or water.  Have arranged for cancer Center social worker to visit with both patient and her husband while they are in the infusion area today in hopes they will be able to assist with these financial difficulties today.

## 2014-09-04 NOTE — Assessment & Plan Note (Signed)
Patient continues complaining of some mild neuropathy to all extremities.  She feels that this neuropathy is stable at present.  Most likely, neuropathy is secondary to previous oxaliplatin.

## 2014-09-04 NOTE — Assessment & Plan Note (Signed)
Patient states that she has been able to decrease her oxycodone use down to only approximately 3 oxycodone tablets per day.  She is requesting a refill of oxycodone today as well.

## 2014-09-04 NOTE — Assessment & Plan Note (Signed)
Patient complaining of some mild, but chronic constipation issues.  Most likely, this is secondary to use of narcotic pain medication.  Long discussion with both patient and her husband via translator regarding the need for a bowel regimen.  Advised patient to use stool softeners twice daily; and Maalox every 4-6 hours until constipation clears.  Following clearing of constipation-patient should continue with stool softeners twice daily and Maalox once a day.  Also gave written instructions for the patient to take home as well.

## 2014-09-04 NOTE — Assessment & Plan Note (Signed)
Patient states that she does experience some nausea and vomiting within the first week following each chemotherapy.  She states that all nausea/vomiting has since cleared.  She is confirmed that she does have antiemetics at home to use as needed.

## 2014-09-04 NOTE — Assessment & Plan Note (Signed)
Patient has a history of pulmonary embolisms; and is currently taking Coumadin which is managed by the Coumadin clinic.

## 2014-09-05 ENCOUNTER — Ambulatory Visit (HOSPITAL_BASED_OUTPATIENT_CLINIC_OR_DEPARTMENT_OTHER): Payer: Medicaid Other

## 2014-09-05 ENCOUNTER — Ambulatory Visit: Payer: Medicaid Other

## 2014-09-05 DIAGNOSIS — C169 Malignant neoplasm of stomach, unspecified: Secondary | ICD-10-CM

## 2014-09-05 DIAGNOSIS — Z5189 Encounter for other specified aftercare: Secondary | ICD-10-CM

## 2014-09-05 MED ORDER — HEPARIN SOD (PORK) LOCK FLUSH 100 UNIT/ML IV SOLN
500.0000 [IU] | Freq: Once | INTRAVENOUS | Status: AC | PRN
  Administered 2014-09-05: 500 [IU]
  Filled 2014-09-05: qty 5

## 2014-09-05 MED ORDER — SODIUM CHLORIDE 0.9 % IJ SOLN
10.0000 mL | INTRAMUSCULAR | Status: DC | PRN
  Administered 2014-09-05: 10 mL
  Filled 2014-09-05: qty 10

## 2014-09-05 MED ORDER — PEGFILGRASTIM INJECTION 6 MG/0.6ML ~~LOC~~
6.0000 mg | PREFILLED_SYRINGE | Freq: Once | SUBCUTANEOUS | Status: AC
  Administered 2014-09-05: 6 mg via SUBCUTANEOUS
  Filled 2014-09-05: qty 0.6

## 2014-09-05 NOTE — Patient Instructions (Signed)
Pegfilgrastim injection What is this medicine? PEGFILGRASTIM (peg fil GRA stim) is a long-acting granulocyte colony-stimulating factor that stimulates the growth of neutrophils, a type of white blood cell important in the body's fight against infection. It is used to reduce the incidence of fever and infection in patients with certain types of cancer who are receiving chemotherapy that affects the bone marrow. This medicine may be used for other purposes; ask your health care provider or pharmacist if you have questions. COMMON BRAND NAME(S): Neulasta What should I tell my health care provider before I take this medicine? They need to know if you have any of these conditions: -latex allergy -ongoing radiation therapy -sickle cell disease -skin reactions to acrylic adhesives (On-Body Injector only) -an unusual or allergic reaction to pegfilgrastim, filgrastim, other medicines, foods, dyes, or preservatives -pregnant or trying to get pregnant -breast-feeding How should I use this medicine? This medicine is for injection under the skin. If you get this medicine at home, you will be taught how to prepare and give the pre-filled syringe or how to use the On-body Injector. Refer to the patient Instructions for Use for detailed instructions. Use exactly as directed. Take your medicine at regular intervals. Do not take your medicine more often than directed. It is important that you put your used needles and syringes in a special sharps container. Do not put them in a trash can. If you do not have a sharps container, call your pharmacist or healthcare provider to get one. Talk to your pediatrician regarding the use of this medicine in children. Special care may be needed. Overdosage: If you think you have taken too much of this medicine contact a poison control center or emergency room at once. NOTE: This medicine is only for you. Do not share this medicine with others. What if I miss a dose? It is  important not to miss your dose. Call your doctor or health care professional if you miss your dose. If you miss a dose due to an On-body Injector failure or leakage, a new dose should be administered as soon as possible using a single prefilled syringe for manual use. What may interact with this medicine? Interactions have not been studied. Give your health care provider a list of all the medicines, herbs, non-prescription drugs, or dietary supplements you use. Also tell them if you smoke, drink alcohol, or use illegal drugs. Some items may interact with your medicine. This list may not describe all possible interactions. Give your health care provider a list of all the medicines, herbs, non-prescription drugs, or dietary supplements you use. Also tell them if you smoke, drink alcohol, or use illegal drugs. Some items may interact with your medicine. What should I watch for while using this medicine? You may need blood work done while you are taking this medicine. If you are going to need a MRI, CT scan, or other procedure, tell your doctor that you are using this medicine (On-Body Injector only). What side effects may I notice from receiving this medicine? Side effects that you should report to your doctor or health care professional as soon as possible: -allergic reactions like skin rash, itching or hives, swelling of the face, lips, or tongue -dizziness -fever -pain, redness, or irritation at site where injected -pinpoint red spots on the skin -shortness of breath or breathing problems -stomach or side pain, or pain at the shoulder -swelling -tiredness -trouble passing urine Side effects that usually do not require medical attention (report to your doctor   or health care professional if they continue or are bothersome): -bone pain -muscle pain This list may not describe all possible side effects. Call your doctor for medical advice about side effects. You may report side effects to FDA at  1-800-FDA-1088. Where should I keep my medicine? Keep out of the reach of children. Store pre-filled syringes in a refrigerator between 2 and 8 degrees C (36 and 46 degrees F). Do not freeze. Keep in carton to protect from light. Throw away this medicine if it is left out of the refrigerator for more than 48 hours. Throw away any unused medicine after the expiration date. NOTE: This sheet is a summary. It may not cover all possible information. If you have questions about this medicine, talk to your doctor, pharmacist, or health care provider.  2015, Elsevier/Gold Standard. (2013-11-01 16:14:05) Fluorouracil, 5FU; Diclofenac topical cream What is this medicine? FLUOROURACIL; DICLOFENAC (flure oh YOOR a sil; dye KLOE fen ak) is a combination of a topical chemotherapy agent and non-steroidal anti-inflammatory drug (NSAID). It is used on the skin to treat skin cancer and skin conditions that could become cancer. This medicine may be used for other purposes; ask your health care provider or pharmacist if you have questions. COMMON BRAND NAME(S): FLUORAC What should I tell my health care provider before I take this medicine? They need to know if you have any of these conditions: -bleeding problems -cigarette smoker -DPD enzyme deficiency -heart disease -high blood pressure -if you frequently drink alcohol containing drinks -kidney disease -liver disease -open or infected skin -stomach problems -swelling or open sores at the treatment site -recent or planned coronary artery bypass graft (CABG) surgery -an unusual or allergic reaction to fluorouracil, diclofenac, aspirin, other NSAIDs, other medicines, foods, dyes, or preservatives -pregnant or trying to get pregnant -breast-feeding How should I use this medicine? This medicine is only for use on the skin. Follow the directions on the prescription label. Wash hands before and after use. Wash affected area and gently pat dry. To apply this  medicine use a cotton-tipped applicator, or use gloves if applying with fingertips. If applied with unprotected fingertips, it is very important to wash your hands well after you apply this medicine. Avoid applying to the eyes, nose, or mouth. Apply enough medicine to cover the affected area. You can cover the area with a light gauze dressing, but do not use tight or air-tight dressings. Finish the full course prescribed by your doctor or health care professional, even if you think your condition is better. Do not stop taking except on the advice of your doctor or health care professional. Talk to your pediatrician regarding the use of this medicine in children. Special care may be needed. Overdosage: If you think you've taken too much of this medicine contact a poison control center or emergency room at once. Overdosage: If you think you have taken too much of this medicine contact a poison control center or emergency room at once. NOTE: This medicine is only for you. Do not share this medicine with others. What if I miss a dose? If you miss a dose, apply it as soon as you can. If it is almost time for your next dose, only use that dose. Do not apply extra doses. Contact your doctor or health care professional if you miss more than one dose. What may interact with this medicine? Interactions are not expected. Do not use any other skin products without telling your doctor or health care professional. This list   may not describe all possible interactions. Give your health care provider a list of all the medicines, herbs, non-prescription drugs, or dietary supplements you use. Also tell them if you smoke, drink alcohol, or use illegal drugs. Some items may interact with your medicine. What should I watch for while using this medicine? Visit your doctor or health care professional for checks on your progress. You will need to use this medicine for 2 to 6 weeks. This may be longer depending on the condition  being treated. You may not see full healing for another 1 to 2 months after you stop using the medicine. Treated areas of skin can look unsightly during and for several weeks after treatment with this medicine. This medicine can make you more sensitive to the sun. Keep out of the sun. If you cannot avoid being in the sun, wear protective clothing and use sunscreen. Do not use sun lamps or tanning beds/booths. Where should I keep my What side effects may I notice from receiving this medicine? Side effects that you should report to your doctor or health care professional as soon as possible: -allergic reactions like skin rash, itching or hives, swelling of the face, lips, or tongue -black or bloody stools, blood in the urine or vomit -blurred vision -chest pain -difficulty breathing or wheezing -redness, blistering, peeling or loosening of the skin, including inside the mouth -severe redness and swelling of normal skin -slurred speech or weakness on one side of the body -trouble passing urine or change in the amount of urine -unexplained weight gain or swelling -unusually weak or tired -yellowing of eyes or skin Side effects that usually do not require medical attention (Report these to your doctor or health care professional if they continue or are bothersome.): -increased sensitivity of the skin to sun and ultraviolet light -pain and burning of the affected area -scaling or swelling of the affected area -skin rash, itching of the affected area -tenderness This list may not describe all possible side effects. Call your doctor for medical advice about side effects. You may report side effects to FDA at 1-800-FDA-1088. Where should I keep my medicine? Keep out of the reach of children. Store at room temperature between 20 and 25 degrees C (68 and 77 degrees F). Throw away any unused medicine after the expiration date. NOTE: This sheet is a summary. It may not cover all possible information.  If you have questions about this medicine, talk to your doctor, pharmacist, or health care provider.  2015, Elsevier/Gold Standard. (2013-12-03 11:09:58)  

## 2014-09-09 ENCOUNTER — Encounter: Payer: Self-pay | Admitting: Pharmacist

## 2014-09-15 ENCOUNTER — Other Ambulatory Visit: Payer: Self-pay | Admitting: Oncology

## 2014-09-17 ENCOUNTER — Telehealth: Payer: Self-pay | Admitting: *Deleted

## 2014-09-17 ENCOUNTER — Telehealth: Payer: Self-pay | Admitting: Oncology

## 2014-09-17 ENCOUNTER — Other Ambulatory Visit (HOSPITAL_BASED_OUTPATIENT_CLINIC_OR_DEPARTMENT_OTHER): Payer: Medicaid Other

## 2014-09-17 ENCOUNTER — Ambulatory Visit: Payer: Medicaid Other | Admitting: Nutrition

## 2014-09-17 ENCOUNTER — Ambulatory Visit: Payer: Medicaid Other

## 2014-09-17 ENCOUNTER — Ambulatory Visit (HOSPITAL_BASED_OUTPATIENT_CLINIC_OR_DEPARTMENT_OTHER): Payer: Medicaid Other | Admitting: Oncology

## 2014-09-17 ENCOUNTER — Ambulatory Visit (HOSPITAL_BASED_OUTPATIENT_CLINIC_OR_DEPARTMENT_OTHER): Payer: Medicaid Other

## 2014-09-17 VITALS — BP 128/80 | HR 82 | Temp 98.2°F | Resp 18 | Ht 60.0 in | Wt 177.4 lb

## 2014-09-17 DIAGNOSIS — Z5111 Encounter for antineoplastic chemotherapy: Secondary | ICD-10-CM

## 2014-09-17 DIAGNOSIS — I2699 Other pulmonary embolism without acute cor pulmonale: Secondary | ICD-10-CM

## 2014-09-17 DIAGNOSIS — C169 Malignant neoplasm of stomach, unspecified: Secondary | ICD-10-CM | POA: Diagnosis not present

## 2014-09-17 DIAGNOSIS — G620 Drug-induced polyneuropathy: Secondary | ICD-10-CM

## 2014-09-17 DIAGNOSIS — Z7901 Long term (current) use of anticoagulants: Secondary | ICD-10-CM

## 2014-09-17 LAB — CBC WITH DIFFERENTIAL/PLATELET
BASO%: 0.5 % (ref 0.0–2.0)
Basophils Absolute: 0 10*3/uL (ref 0.0–0.1)
EOS%: 0.6 % (ref 0.0–7.0)
Eosinophils Absolute: 0 10*3/uL (ref 0.0–0.5)
HCT: 30.4 % — ABNORMAL LOW (ref 34.8–46.6)
HEMOGLOBIN: 9.7 g/dL — AB (ref 11.6–15.9)
LYMPH#: 1.3 10*3/uL (ref 0.9–3.3)
LYMPH%: 18.3 % (ref 14.0–49.7)
MCH: 29.2 pg (ref 25.1–34.0)
MCHC: 31.8 g/dL (ref 31.5–36.0)
MCV: 91.9 fL (ref 79.5–101.0)
MONO#: 0.9 10*3/uL (ref 0.1–0.9)
MONO%: 13.7 % (ref 0.0–14.0)
NEUT#: 4.6 10*3/uL (ref 1.5–6.5)
NEUT%: 66.9 % (ref 38.4–76.8)
Platelets: 238 10*3/uL (ref 145–400)
RBC: 3.31 10*6/uL — ABNORMAL LOW (ref 3.70–5.45)
RDW: 19.5 % — AB (ref 11.2–14.5)
WBC: 6.8 10*3/uL (ref 3.9–10.3)

## 2014-09-17 LAB — COMPREHENSIVE METABOLIC PANEL (CC13)
ALBUMIN: 3.5 g/dL (ref 3.5–5.0)
ALT: 11 U/L (ref 0–55)
AST: 13 U/L (ref 5–34)
Alkaline Phosphatase: 161 U/L — ABNORMAL HIGH (ref 40–150)
Anion Gap: 10 mEq/L (ref 3–11)
BILIRUBIN TOTAL: 0.56 mg/dL (ref 0.20–1.20)
BUN: 8.7 mg/dL (ref 7.0–26.0)
CO2: 27 mEq/L (ref 22–29)
Calcium: 8.5 mg/dL (ref 8.4–10.4)
Chloride: 105 mEq/L (ref 98–109)
Creatinine: 0.7 mg/dL (ref 0.6–1.1)
EGFR: >90 mL/min/{1.73_m2} (ref >90)
Glucose: 93 mg/dl (ref 70–140)
POTASSIUM: 3.7 meq/L (ref 3.5–5.1)
SODIUM: 141 meq/L (ref 136–145)
Total Protein: 6.1 g/dL — ABNORMAL LOW (ref 6.4–8.3)

## 2014-09-17 LAB — PROTIME-INR
INR: 1.9 — AB (ref 2.00–3.50)
PROTIME: 22.8 s — AB (ref 10.6–13.4)

## 2014-09-17 MED ORDER — FLUOROURACIL CHEMO INJECTION 2.5 GM/50ML
400.0000 mg/m2 | Freq: Once | INTRAVENOUS | Status: AC
  Administered 2014-09-17: 750 mg via INTRAVENOUS
  Filled 2014-09-17: qty 15

## 2014-09-17 MED ORDER — ATROPINE SULFATE 1 MG/ML IJ SOLN
0.5000 mg | Freq: Once | INTRAMUSCULAR | Status: AC | PRN
  Administered 2014-09-17: 0.5 mg via INTRAVENOUS

## 2014-09-17 MED ORDER — DEXAMETHASONE SODIUM PHOSPHATE 20 MG/5ML IJ SOLN: 20.0000 mg | Freq: Once | INTRAMUSCULAR | Status: DC

## 2014-09-17 MED ORDER — DEXAMETHASONE SODIUM PHOSPHATE 20 MG/5ML IJ SOLN
12.0000 mg | Freq: Once | INTRAMUSCULAR | Status: AC
  Administered 2014-09-17: 12 mg via INTRAVENOUS

## 2014-09-17 MED ORDER — PALONOSETRON HCL INJECTION 0.25 MG/5ML
0.2500 mg | Freq: Once | INTRAVENOUS | Status: AC
  Administered 2014-09-17: 0.25 mg via INTRAVENOUS

## 2014-09-17 MED ORDER — DEXAMETHASONE SODIUM PHOSPHATE 20 MG/5ML IJ SOLN
INTRAMUSCULAR | Status: AC
  Filled 2014-09-17: qty 5

## 2014-09-17 MED ORDER — IRINOTECAN HCL CHEMO INJECTION 100 MG/5ML
180.0000 mg/m2 | Freq: Once | INTRAVENOUS | Status: AC
  Administered 2014-09-17: 346 mg via INTRAVENOUS
  Filled 2014-09-17: qty 17.3

## 2014-09-17 MED ORDER — LEUCOVORIN CALCIUM INJECTION 350 MG
400.0000 mg/m2 | Freq: Once | INTRAVENOUS | Status: AC
  Administered 2014-09-17 (×2): 768 mg via INTRAVENOUS
  Filled 2014-09-17: qty 38.4

## 2014-09-17 MED ORDER — PALONOSETRON HCL INJECTION 0.25 MG/5ML
INTRAVENOUS | Status: AC
  Filled 2014-09-17: qty 5

## 2014-09-17 MED ORDER — ATROPINE SULFATE 1 MG/ML IJ SOLN
INTRAMUSCULAR | Status: AC
  Filled 2014-09-17: qty 1

## 2014-09-17 MED ORDER — SODIUM CHLORIDE 0.9 % IV SOLN
Freq: Once | INTRAVENOUS | Status: AC
  Administered 2014-09-17: 10:00:00 via INTRAVENOUS

## 2014-09-17 MED ORDER — SODIUM CHLORIDE 0.9 % IV SOLN
2400.0000 mg/m2 | INTRAVENOUS | Status: DC
  Administered 2014-09-17: 4600 mg via INTRAVENOUS
  Filled 2014-09-17: qty 92

## 2014-09-17 MED ORDER — SODIUM CHLORIDE 0.9 % IV SOLN
150.0000 mg | Freq: Once | INTRAVENOUS | Status: AC
  Administered 2014-09-17: 150 mg via INTRAVENOUS
  Filled 2014-09-17: qty 5

## 2014-09-17 NOTE — Telephone Encounter (Signed)
Pt confirmed labs/ov per 02/02 POF, gave pt AVS.... KJ, sent msg to add chemo.... °

## 2014-09-17 NOTE — Progress Notes (Signed)
Patient is a 54 year old female diagnosed with gastric cancer.  She is a patient of Dr. Benay Spice.  Past medical history was reviewed.  Medications include Zofran, Protonix, Compazine, Zoloft, and Coumadin.  Labs were reviewed.  Height: 60 inches. Weight: 177.4 pounds. Usual body weight: 196 pounds October 2015. BMI: 34.65.  Met with patient and interpreter, during chemotherapy. Patient reports she has experienced nausea with vomiting after chemotherapy. She has had diarrhea approximately one week after chemotherapy. She has a poor appetite. Patient was trying to drink milk, however this did not agree with her. Patient was also drinking Ensure Plus.  Nutrition diagnosis: Unintended weight loss related to diagnosis of gastric cancer and associated treatments as evidenced by 9% weight loss over 3 months.  Patient meets criteria for severe malnutrition in the context of chronic illness secondary to greater than 7.5% weight loss over 3 months and less than 75% energy intake for greater than one month.  Intervention: Patient educated to consume small frequent meals and snacks consuming high-protein foods. Educated patient on strategies for eating with nausea. Encouraged patient to consume bland, low fiber diet while she is having diarrhea. Provided patient with samples of juice-based oral nutrition supplements.  Recommended patient try one to 2 daily. Questions were answered.  Teach back method used.  Monitoring, evaluation, goals: Patient will work to increase oral intake to minimize further weight loss.    Next visit: Tuesday, February 16, during chemotherapy.  **Disclaimer: This note was dictated with voice recognition software. Similar sounding words can inadvertently be transcribed and this note may contain transcription errors which may not have been corrected upon publication of note.**

## 2014-09-17 NOTE — Progress Notes (Signed)
  [facility] OFFICE PROGRESS NOTE   Diagnosis: Gastric cancer  INTERVAL HISTORY:   She returns as scheduled. She completed a third cycle of FOLFIRI on 09/03/2014. She received Neulasta support. She reports nausea for several days following chemotherapy. She had diarrhea throughout the week following chemotherapy. The nausea and diarrhea have resolved. No abdominal pain. She had bone pain following Neulasta that was relieved with oxycodone. Her appetite is poor.  Objective:  Vital signs in last 24 hours:  Blood pressure 128/80, pulse 82, temperature 98.2 F (36.8 C), temperature source Oral, resp. rate 18, height 5' (1.524 m), weight 177 lb 6.4 oz (80.468 kg).    HEENT: No thrush or ulcers Resp: Coarse rhonchi at the left posterior base, no respiratory distress Cardio: Regular rate and rhythm GI: No hepatomegaly, nontender, no mass Vascular: No leg edema  Portacath/PICC-without erythema  Lab Results:  Lab Results  Component Value Date   WBC 6.8 09/17/2014   HGB 9.7* 09/17/2014   HCT 30.4* 09/17/2014   MCV 91.9 09/17/2014   PLT 238 09/17/2014   NEUTROABS 4.6 09/17/2014   potassium 3.7, crowding 0.7  PT/INR-1.9  Medications: I have reviewed the patient's current medications.  Assessment/Plan: 1. Stage IV gastric cancer  Status post bilateral oophorectomy, total abdominal hysterectomy, omentectomy, resection of abdominal wall and pelvic masses, bilateral pelvic lymphadenectomy 10/18/2013 with the pathology confirming moderate to poorly differentiated adenocarcinoma  Stomach biopsy 11/28/2013 revealed adenocarcinoma  Status post 12 cycles of FOLFOX, last given 04/25/2014  Restaging CT 04/29/2014 with thickening of the stomach and nodular densities adjacent to the greater curvature-unchanged, faint groundglass nodularity in the lungs-nonspecific  Restaging CT 07/22/2014 with indeterminate lung nodules; moderate wall thickening in the body region of the  stomach; fairly extensive omental disease and findings suspicious for abdominal wall and incisional disease.  Started systemic chemotherapy with FOLFIRI on 07/30/14.  2. Oxaliplatin neuropathy  3. Bilateral upper and lower lobe pulmonary emboli within segmental branches-now maintained on Coumadin. Followed by Coumadin clinic per PCP.  4. Neutropenia related to chemotherapy 08/13/2014. Cycle 2 FOLFIRI held. Neulasta added beginning with cycle 2 on 08/20/2014.  5. Nausea following chemotherapy-the anti-emetic regimen will be adjusted with cycle 4 FOLFIRI 09/17/2014  6. Diarrhea following FOLFIRI-she will try Imodium. She will contact us if the Imodium does not help  Disposition:  She has completed 3 treatments with FOLFIRI. The abdominal pain has resolved. We will adjust the anti-emetic regimen with cycle 4 and she will try Imodium for diarrhea. The plan is to schedule a restaging CT evaluation after cycle 5. She will return for an office visit and chemotherapy in 2 weeks.  Betsy Coder, MD  09/17/2014  9:38 AM

## 2014-09-17 NOTE — Telephone Encounter (Signed)
Per staff message and POF I have scheduled appts. Advised scheduler of appts. JMW  

## 2014-09-17 NOTE — Patient Instructions (Signed)
[facility] Discharge Instructions for Patients Receiving Chemotherapy  Today you received the following chemotherapy agents: Irinotecan, Leucovorin, 5FU  To help prevent nausea and vomiting after your treatment, we encourage you to take your nausea medication:  Compazine 10 mg every 6 hours as needed.   If you develop nausea and vomiting that is not controlled by your nausea medication, call the clinic.   BELOW ARE SYMPTOMS THAT SHOULD BE REPORTED IMMEDIATELY:  *FEVER GREATER THAN 100.5 F  *CHILLS WITH OR WITHOUT FEVER  NAUSEA AND VOMITING THAT IS NOT CONTROLLED WITH YOUR NAUSEA MEDICATION  *UNUSUAL SHORTNESS OF BREATH  *UNUSUAL BRUISING OR BLEEDING  TENDERNESS IN MOUTH AND THROAT WITH OR WITHOUT PRESENCE OF ULCERS  *URINARY PROBLEMS  *BOWEL PROBLEMS  UNUSUAL RASH Items with * indicate a potential emergency and should be followed up as soon as possible.  Feel free to call the clinic you have any questions or concerns. The clinic phone number is [phone].

## 2014-09-19 ENCOUNTER — Ambulatory Visit (HOSPITAL_BASED_OUTPATIENT_CLINIC_OR_DEPARTMENT_OTHER): Payer: Medicaid Other

## 2014-09-19 ENCOUNTER — Ambulatory Visit: Payer: Medicaid Other

## 2014-09-19 DIAGNOSIS — C169 Malignant neoplasm of stomach, unspecified: Secondary | ICD-10-CM

## 2014-09-19 MED ORDER — HEPARIN SOD (PORK) LOCK FLUSH 100 UNIT/ML IV SOLN
500.0000 [IU] | Freq: Once | INTRAVENOUS | Status: AC | PRN
  Administered 2014-09-19: 500 [IU]
  Filled 2014-09-19: qty 5

## 2014-09-19 MED ORDER — SODIUM CHLORIDE 0.9 % IJ SOLN
10.0000 mL | INTRAMUSCULAR | Status: DC | PRN
  Administered 2014-09-19: 10 mL
  Filled 2014-09-19: qty 10

## 2014-09-19 MED ORDER — PEGFILGRASTIM INJECTION 6 MG/0.6ML ~~LOC~~
6.0000 mg | PREFILLED_SYRINGE | Freq: Once | SUBCUTANEOUS | Status: AC
  Administered 2014-09-19: 6 mg via SUBCUTANEOUS
  Filled 2014-09-19: qty 0.6

## 2014-09-19 NOTE — Patient Instructions (Signed)
Pegfilgrastim injection What is this medicine? PEGFILGRASTIM (peg fil GRA stim) is a long-acting granulocyte colony-stimulating factor that stimulates the growth of neutrophils, a type of white blood cell important in the body's fight against infection. It is used to reduce the incidence of fever and infection in patients with certain types of cancer who are receiving chemotherapy that affects the bone marrow. This medicine may be used for other purposes; ask your health care provider or pharmacist if you have questions. COMMON BRAND NAME(S): Neulasta What should I tell my health care provider before I take this medicine? They need to know if you have any of these conditions: -latex allergy -ongoing radiation therapy -sickle cell disease -skin reactions to acrylic adhesives (On-Body Injector only) -an unusual or allergic reaction to pegfilgrastim, filgrastim, other medicines, foods, dyes, or preservatives -pregnant or trying to get pregnant -breast-feeding How should I use this medicine? This medicine is for injection under the skin. If you get this medicine at home, you will be taught how to prepare and give the pre-filled syringe or how to use the On-body Injector. Refer to the patient Instructions for Use for detailed instructions. Use exactly as directed. Take your medicine at regular intervals. Do not take your medicine more often than directed. It is important that you put your used needles and syringes in a special sharps container. Do not put them in a trash can. If you do not have a sharps container, call your pharmacist or healthcare provider to get one. Talk to your pediatrician regarding the use of this medicine in children. Special care may be needed. Overdosage: If you think you have taken too much of this medicine contact a poison control center or emergency room at once. NOTE: This medicine is only for you. Do not share this medicine with others. What if I miss a dose? It is  important not to miss your dose. Call your doctor or health care professional if you miss your dose. If you miss a dose due to an On-body Injector failure or leakage, a new dose should be administered as soon as possible using a single prefilled syringe for manual use. What may interact with this medicine? Interactions have not been studied. Give your health care provider a list of all the medicines, herbs, non-prescription drugs, or dietary supplements you use. Also tell them if you smoke, drink alcohol, or use illegal drugs. Some items may interact with your medicine. This list may not describe all possible interactions. Give your health care provider a list of all the medicines, herbs, non-prescription drugs, or dietary supplements you use. Also tell them if you smoke, drink alcohol, or use illegal drugs. Some items may interact with your medicine. What should I watch for while using this medicine? You may need blood work done while you are taking this medicine. If you are going to need a MRI, CT scan, or other procedure, tell your doctor that you are using this medicine (On-Body Injector only). What side effects may I notice from receiving this medicine? Side effects that you should report to your doctor or health care professional as soon as possible: -allergic reactions like skin rash, itching or hives, swelling of the face, lips, or tongue -dizziness -fever -pain, redness, or irritation at site where injected -pinpoint red spots on the skin -shortness of breath or breathing problems -stomach or side pain, or pain at the shoulder -swelling -tiredness -trouble passing urine Side effects that usually do not require medical attention (report to your doctor   or health care professional if they continue or are bothersome): -bone pain -muscle pain This list may not describe all possible side effects. Call your doctor for medical advice about side effects. You may report side effects to FDA at  1-800-FDA-1088. Where should I keep my medicine? Keep out of the reach of children. Store pre-filled syringes in a refrigerator between 2 and 8 degrees C (36 and 46 degrees F). Do not freeze. Keep in carton to protect from light. Throw away this medicine if it is left out of the refrigerator for more than 48 hours. Throw away any unused medicine after the expiration date. NOTE: This sheet is a summary. It may not cover all possible information. If you have questions about this medicine, talk to your doctor, pharmacist, or health care provider.  2015, [employer]. (2013-11-01 16:14:05)  

## 2014-09-29 ENCOUNTER — Other Ambulatory Visit: Payer: Self-pay | Admitting: Oncology

## 2014-10-01 ENCOUNTER — Ambulatory Visit (HOSPITAL_BASED_OUTPATIENT_CLINIC_OR_DEPARTMENT_OTHER): Payer: Medicaid Other

## 2014-10-01 ENCOUNTER — Telehealth: Payer: Self-pay | Admitting: Oncology

## 2014-10-01 ENCOUNTER — Ambulatory Visit: Payer: Self-pay | Admitting: Nurse Practitioner

## 2014-10-01 ENCOUNTER — Other Ambulatory Visit: Payer: Self-pay | Admitting: *Deleted

## 2014-10-01 ENCOUNTER — Ambulatory Visit: Payer: Medicaid Other | Admitting: Nutrition

## 2014-10-01 ENCOUNTER — Other Ambulatory Visit: Payer: Self-pay

## 2014-10-01 ENCOUNTER — Other Ambulatory Visit (HOSPITAL_BASED_OUTPATIENT_CLINIC_OR_DEPARTMENT_OTHER): Payer: Medicaid Other

## 2014-10-01 ENCOUNTER — Ambulatory Visit (HOSPITAL_BASED_OUTPATIENT_CLINIC_OR_DEPARTMENT_OTHER): Payer: Medicaid Other | Admitting: Oncology

## 2014-10-01 VITALS — BP 131/79 | HR 84 | Temp 99.2°F | Resp 18 | Ht 60.0 in | Wt 175.0 lb

## 2014-10-01 DIAGNOSIS — Z5111 Encounter for antineoplastic chemotherapy: Secondary | ICD-10-CM | POA: Diagnosis not present

## 2014-10-01 DIAGNOSIS — C169 Malignant neoplasm of stomach, unspecified: Secondary | ICD-10-CM

## 2014-10-01 DIAGNOSIS — I2699 Other pulmonary embolism without acute cor pulmonale: Secondary | ICD-10-CM | POA: Diagnosis not present

## 2014-10-01 DIAGNOSIS — Z452 Encounter for adjustment and management of vascular access device: Secondary | ICD-10-CM

## 2014-10-01 DIAGNOSIS — R918 Other nonspecific abnormal finding of lung field: Secondary | ICD-10-CM

## 2014-10-01 DIAGNOSIS — D701 Agranulocytosis secondary to cancer chemotherapy: Secondary | ICD-10-CM | POA: Diagnosis not present

## 2014-10-01 DIAGNOSIS — Z95828 Presence of other vascular implants and grafts: Secondary | ICD-10-CM

## 2014-10-01 DIAGNOSIS — G620 Drug-induced polyneuropathy: Secondary | ICD-10-CM | POA: Diagnosis not present

## 2014-10-01 LAB — COMPREHENSIVE METABOLIC PANEL (CC13)
ALT: 13 U/L (ref 0–55)
AST: 14 U/L (ref 5–34)
Albumin: 3.2 g/dL — ABNORMAL LOW (ref 3.5–5.0)
Alkaline Phosphatase: 155 U/L — ABNORMAL HIGH (ref 40–150)
Anion Gap: 11 mEq/L (ref 3–11)
BUN: 9.4 mg/dL (ref 7.0–26.0)
CALCIUM: 8.9 mg/dL (ref 8.4–10.4)
CHLORIDE: 104 meq/L (ref 98–109)
CO2: 27 meq/L (ref 22–29)
CREATININE: 0.7 mg/dL (ref 0.6–1.1)
EGFR: >90 mL/min/{1.73_m2} (ref >90)
Glucose: 101 mg/dl (ref 70–140)
Potassium: 3.5 mEq/L (ref 3.5–5.1)
Sodium: 141 mEq/L (ref 136–145)
TOTAL PROTEIN: 5.7 g/dL — AB (ref 6.4–8.3)
Total Bilirubin: 0.56 mg/dL (ref 0.20–1.20)

## 2014-10-01 LAB — CBC WITH DIFFERENTIAL/PLATELET
BASO%: 0.5 % (ref 0.0–2.0)
Basophils Absolute: 0.1 10*3/uL (ref 0.0–0.1)
EOS%: 0.3 % (ref 0.0–7.0)
Eosinophils Absolute: 0 10*3/uL (ref 0.0–0.5)
HCT: 28.3 % — ABNORMAL LOW (ref 34.8–46.6)
HEMOGLOBIN: 8.9 g/dL — AB (ref 11.6–15.9)
LYMPH#: 1.3 10*3/uL (ref 0.9–3.3)
LYMPH%: 9.8 % — ABNORMAL LOW (ref 14.0–49.7)
MCH: 29.2 pg (ref 25.1–34.0)
MCHC: 31.5 g/dL (ref 31.5–36.0)
MCV: 92.8 fL (ref 79.5–101.0)
MONO#: 1.2 10*3/uL — ABNORMAL HIGH (ref 0.1–0.9)
MONO%: 8.7 % (ref 0.0–14.0)
NEUT#: 10.8 10*3/uL — ABNORMAL HIGH (ref 1.5–6.5)
NEUT%: 80.7 % — ABNORMAL HIGH (ref 38.4–76.8)
Platelets: 217 10*3/uL (ref 145–400)
RBC: 3.05 10*6/uL — AB (ref 3.70–5.45)
RDW: 22.6 % — AB (ref 11.2–14.5)
WBC: 13.4 10*3/uL — ABNORMAL HIGH (ref 3.9–10.3)

## 2014-10-01 LAB — PROTIME-INR
INR: 1.7 — ABNORMAL LOW (ref 2.00–3.50)
Protime: 20.4 Seconds — ABNORMAL HIGH (ref 10.6–13.4)

## 2014-10-01 MED ORDER — PALONOSETRON HCL INJECTION 0.25 MG/5ML
INTRAVENOUS | Status: AC
Start: 2014-10-01 — End: 2014-10-01
  Filled 2014-10-01: qty 5

## 2014-10-01 MED ORDER — PALONOSETRON HCL INJECTION 0.25 MG/5ML
0.2500 mg | Freq: Once | INTRAVENOUS | Status: AC
  Administered 2014-10-01: 0.25 mg via INTRAVENOUS

## 2014-10-01 MED ORDER — SODIUM CHLORIDE 0.9 % IV SOLN
150.0000 mg | Freq: Once | INTRAVENOUS | Status: AC
  Administered 2014-10-01: 150 mg via INTRAVENOUS
  Filled 2014-10-01: qty 5

## 2014-10-01 MED ORDER — IRINOTECAN HCL CHEMO INJECTION 100 MG/5ML
180.0000 mg/m2 | Freq: Once | INTRAVENOUS | Status: AC
  Administered 2014-10-01: 346 mg via INTRAVENOUS
  Filled 2014-10-01: qty 17.3

## 2014-10-01 MED ORDER — LEUCOVORIN CALCIUM INJECTION 350 MG
400.0000 mg/m2 | Freq: Once | INTRAVENOUS | Status: AC
  Administered 2014-10-01: 768 mg via INTRAVENOUS
  Filled 2014-10-01: qty 38.4

## 2014-10-01 MED ORDER — DEXAMETHASONE SODIUM PHOSPHATE 20 MG/5ML IJ SOLN
INTRAMUSCULAR | Status: AC
  Filled 2014-10-01: qty 5

## 2014-10-01 MED ORDER — ATROPINE SULFATE 1 MG/ML IJ SOLN
0.5000 mg | Freq: Once | INTRAMUSCULAR | Status: AC | PRN
  Administered 2014-10-01: 0.5 mg via INTRAVENOUS

## 2014-10-01 MED ORDER — OXYCODONE HCL 5 MG PO TABS: ORAL_TABLET | ORAL | Status: DC

## 2014-10-01 MED ORDER — DEXAMETHASONE SODIUM PHOSPHATE 20 MG/5ML IJ SOLN
12.0000 mg | Freq: Once | INTRAMUSCULAR | Status: AC
  Administered 2014-10-01: 12 mg via INTRAVENOUS

## 2014-10-01 MED ORDER — FLUOROURACIL CHEMO INJECTION 2.5 GM/50ML
400.0000 mg/m2 | Freq: Once | INTRAVENOUS | Status: AC
  Administered 2014-10-01: 750 mg via INTRAVENOUS
  Filled 2014-10-01: qty 15

## 2014-10-01 MED ORDER — SODIUM CHLORIDE 0.9 % IV SOLN
2400.0000 mg/m2 | INTRAVENOUS | Status: DC
  Administered 2014-10-01: 4600 mg via INTRAVENOUS
  Filled 2014-10-01: qty 92

## 2014-10-01 MED ORDER — SODIUM CHLORIDE 0.9 % IV SOLN
Freq: Once | INTRAVENOUS | Status: AC
  Administered 2014-10-01: 11:00:00 via INTRAVENOUS

## 2014-10-01 MED ORDER — ATROPINE SULFATE 1 MG/ML IJ SOLN
INTRAMUSCULAR | Status: AC
  Filled 2014-10-01: qty 1

## 2014-10-01 MED ORDER — SODIUM CHLORIDE 0.9 % IJ SOLN
10.0000 mL | INTRAMUSCULAR | Status: DC | PRN
  Administered 2014-10-01: 10 mL via INTRAVENOUS
  Filled 2014-10-01: qty 10

## 2014-10-01 NOTE — Telephone Encounter (Signed)
Gave avs & calendar for February/March.  °

## 2014-10-01 NOTE — Progress Notes (Signed)
Nutrition follow-up completed with patient and husband along with interpreter. Patient reports appetite somewhat improved. Nausea is controlled. Patient is struggling with increased diarrhea. She is drinking one Ensure Plus a day however feels this contributes to her diarrhea. Weight decreased and documented as 175 pounds February 16, down from 177.4 pounds February 2.  Diagnosis: Unintended weight loss continues.  Intervention:  Patient encouraged to continue small frequent meals and snacks. Recommended patient drink 4 ounces of Ensure Plus in the morning and 4 ounces of Ensure Plus in the afternoon and evaluate for improvement in diarrhea. Teach back method used.  Monitoring, evaluation, goals: Patient will work to increase oral intake to minimize further weight loss.  Next visit: Tuesday, March 1 during chemotherapy.  **Disclaimer: This note was dictated with voice recognition software. Similar sounding words can inadvertently be transcribed and this note may contain transcription errors which may not have been corrected upon publication of note.**

## 2014-10-01 NOTE — Patient Instructions (Signed)
[facility] Discharge Instructions for Patients Receiving Chemotherapy  Today you received the following chemotherapy agents: Irinotecan, Leucovorin, 5FU  To help prevent nausea and vomiting after your treatment, we encourage you to take your nausea medication:Compazine 10 mg every 6 hours as needed; Zofran 4 mg every 6 hours as needed.   If you develop nausea and vomiting that is not controlled by your nausea medication, call the clinic.   BELOW ARE SYMPTOMS THAT SHOULD BE REPORTED IMMEDIATELY:  *FEVER GREATER THAN 100.5 F  *CHILLS WITH OR WITHOUT FEVER  NAUSEA AND VOMITING THAT IS NOT CONTROLLED WITH YOUR NAUSEA MEDICATION  *UNUSUAL SHORTNESS OF BREATH  *UNUSUAL BRUISING OR BLEEDING  TENDERNESS IN MOUTH AND THROAT WITH OR WITHOUT PRESENCE OF ULCERS  *URINARY PROBLEMS  *BOWEL PROBLEMS  UNUSUAL RASH Items with * indicate a potential emergency and should be followed up as soon as possible.  Feel free to call the clinic you have any questions or concerns. The clinic phone number is [phone].

## 2014-10-01 NOTE — Patient Instructions (Signed)

## 2014-10-01 NOTE — Progress Notes (Signed)
  [facility] OFFICE PROGRESS NOTE   Diagnosis: Gastric cancer  INTERVAL HISTORY:   She returns as scheduled. She completed cycle 4 of FOLFIRI on 09/17/2014. She had several episodes of nausea following chemotherapy, but the nausea was improved with this cycle. She reports diarrhea following chemotherapy, up to 3-4 stools per day, improved today. The abdominal pain remains improved.  Objective:  Vital signs in last 24 hours:  Blood pressure 131/79, pulse 84, temperature 99.2 F (37.3 C), temperature source Oral, resp. rate 18, height 5' (1.524 m), weight 175 lb (79.379 kg), SpO2 97 %.    HEENT: No thrush or ulcers, hyperpigmentation Resp: Lungs clear bilaterally Cardio: Regular rate and rhythm GI: No hepatomegaly, nontender, no mass Vascular: No leg edema  Skin: Mild hyperpigmentation of the hands  Portacath/PICC-without erythema  Lab Results:  Lab Results  Component Value Date   WBC 13.4* 10/01/2014   HGB 8.9* 10/01/2014   HCT 28.3* 10/01/2014   MCV 92.8 10/01/2014   PLT 217 10/01/2014   NEUTROABS 10.8* 10/01/2014   PT/INR 1.7   Lab Results  Component Value Date   CEA 4.3 07/30/2014    Medications: I have reviewed the patient's current medications.  Assessment/Plan: 1. Stage IV gastric cancer  Status post bilateral oophorectomy, total abdominal hysterectomy, omentectomy, resection of abdominal wall and pelvic masses, bilateral pelvic lymphadenectomy 10/18/2013 with the pathology confirming moderate to poorly differentiated adenocarcinoma  Stomach biopsy 11/28/2013 revealed adenocarcinoma  Status post 12 cycles of FOLFOX, last given 04/25/2014  Restaging CT 04/29/2014 with thickening of the stomach and nodular densities adjacent to the greater curvature-unchanged, faint groundglass nodularity in the lungs-nonspecific  Restaging CT 07/22/2014 with indeterminate lung nodules; moderate wall thickening in the body region of the stomach; fairly  extensive omental disease and findings suspicious for abdominal wall and incisional disease.  Started systemic chemotherapy with FOLFIRI on 07/30/14.  2. Oxaliplatin neuropathy  3. Bilateral upper and lower lobe pulmonary emboli within segmental branches-now maintained on Coumadin. Followed by Coumadin clinic per PCP.  4. Neutropenia related to chemotherapy 08/13/2014. Cycle 2 FOLFIRI held. Neulasta added beginning with cycle 2 on 08/20/2014.  5. Nausea following chemotherapy-the anti-emetic regimen was adjusted with cycle 4 FOLFIRI 09/17/2014  6. Diarrhea following FOLFIRI-she will increase the Imodium. She knows to contact us if this does not help.    Disposition:  Her overall status appears unchanged. She will complete cycle 5 of FOLFIRI chemotherapy today. Ms. Mayer will be scheduled for a restaging CT evaluation prior to an office visit in 2 weeks. She will call us for increased nausea or diarrhea following this cycle of chemotherapy.  Betsy Coder, MD  10/01/2014  9:35 AM

## 2014-10-03 ENCOUNTER — Ambulatory Visit (HOSPITAL_BASED_OUTPATIENT_CLINIC_OR_DEPARTMENT_OTHER): Payer: Medicaid Other

## 2014-10-03 ENCOUNTER — Ambulatory Visit: Payer: Medicaid Other

## 2014-10-03 DIAGNOSIS — C169 Malignant neoplasm of stomach, unspecified: Secondary | ICD-10-CM

## 2014-10-03 DIAGNOSIS — Z5189 Encounter for other specified aftercare: Secondary | ICD-10-CM

## 2014-10-03 MED ORDER — HEPARIN SOD (PORK) LOCK FLUSH 100 UNIT/ML IV SOLN
500.0000 [IU] | Freq: Once | INTRAVENOUS | Status: AC | PRN
  Administered 2014-10-03: 500 [IU]
  Filled 2014-10-03: qty 5

## 2014-10-03 MED ORDER — PEGFILGRASTIM INJECTION 6 MG/0.6ML ~~LOC~~
6.0000 mg | PREFILLED_SYRINGE | Freq: Once | SUBCUTANEOUS | Status: AC
  Administered 2014-10-03: 6 mg via SUBCUTANEOUS
  Filled 2014-10-03: qty 0.6

## 2014-10-03 MED ORDER — SODIUM CHLORIDE 0.9 % IJ SOLN
10.0000 mL | INTRAMUSCULAR | Status: DC | PRN
  Administered 2014-10-03: 10 mL
  Filled 2014-10-03: qty 10

## 2014-10-03 NOTE — Patient Instructions (Signed)
Pegfilgrastim injection What is this medicine? PEGFILGRASTIM (peg fil GRA stim) is a long-acting granulocyte colony-stimulating factor that stimulates the growth of neutrophils, a type of white blood cell important in the body's fight against infection. It is used to reduce the incidence of fever and infection in patients with certain types of cancer who are receiving chemotherapy that affects the bone marrow. This medicine may be used for other purposes; ask your health care provider or pharmacist if you have questions. COMMON BRAND NAME(S): Neulasta What should I tell my health care provider before I take this medicine? They need to know if you have any of these conditions: -latex allergy -ongoing radiation therapy -sickle cell disease -skin reactions to acrylic adhesives (On-Body Injector only) -an unusual or allergic reaction to pegfilgrastim, filgrastim, other medicines, foods, dyes, or preservatives -pregnant or trying to get pregnant -breast-feeding How should I use this medicine? This medicine is for injection under the skin. If you get this medicine at home, you will be taught how to prepare and give the pre-filled syringe or how to use the On-body Injector. Refer to the patient Instructions for Use for detailed instructions. Use exactly as directed. Take your medicine at regular intervals. Do not take your medicine more often than directed. It is important that you put your used needles and syringes in a special sharps container. Do not put them in a trash can. If you do not have a sharps container, call your pharmacist or healthcare provider to get one. Talk to your pediatrician regarding the use of this medicine in children. Special care may be needed. Overdosage: If you think you have taken too much of this medicine contact a poison control center or emergency room at once. NOTE: This medicine is only for you. Do not share this medicine with others. What if I miss a dose? It is  important not to miss your dose. Call your doctor or health care professional if you miss your dose. If you miss a dose due to an On-body Injector failure or leakage, a new dose should be administered as soon as possible using a single prefilled syringe for manual use. What may interact with this medicine? Interactions have not been studied. Give your health care provider a list of all the medicines, herbs, non-prescription drugs, or dietary supplements you use. Also tell them if you smoke, drink alcohol, or use illegal drugs. Some items may interact with your medicine. This list may not describe all possible interactions. Give your health care provider a list of all the medicines, herbs, non-prescription drugs, or dietary supplements you use. Also tell them if you smoke, drink alcohol, or use illegal drugs. Some items may interact with your medicine. What should I watch for while using this medicine? You may need blood work done while you are taking this medicine. If you are going to need a MRI, CT scan, or other procedure, tell your doctor that you are using this medicine (On-Body Injector only). What side effects may I notice from receiving this medicine? Side effects that you should report to your doctor or health care professional as soon as possible: -allergic reactions like skin rash, itching or hives, swelling of the face, lips, or tongue -dizziness -fever -pain, redness, or irritation at site where injected -pinpoint red spots on the skin -shortness of breath or breathing problems -stomach or side pain, or pain at the shoulder -swelling -tiredness -trouble passing urine Side effects that usually do not require medical attention (report to your doctor   or health care professional if they continue or are bothersome): -bone pain -muscle pain This list may not describe all possible side effects. Call your doctor for medical advice about side effects. You may report side effects to FDA at  1-800-FDA-1088. Where should I keep my medicine? Keep out of the reach of children. Store pre-filled syringes in a refrigerator between 2 and 8 degrees C (36 and 46 degrees F). Do not freeze. Keep in carton to protect from light. Throw away this medicine if it is left out of the refrigerator for more than 48 hours. Throw away any unused medicine after the expiration date. NOTE: This sheet is a summary. It may not cover all possible information. If you have questions about this medicine, talk to your doctor, pharmacist, or health care provider.  2015, Elsevier/Gold Standard. (2013-11-01 16:14:05) Fluorouracil, 5FU; Diclofenac topical cream What is this medicine? FLUOROURACIL; DICLOFENAC (flure oh YOOR a sil; dye KLOE fen ak) is a combination of a topical chemotherapy agent and non-steroidal anti-inflammatory drug (NSAID). It is used on the skin to treat skin cancer and skin conditions that could become cancer. This medicine may be used for other purposes; ask your health care provider or pharmacist if you have questions. COMMON BRAND NAME(S): FLUORAC What should I tell my health care provider before I take this medicine? They need to know if you have any of these conditions: -bleeding problems -cigarette smoker -DPD enzyme deficiency -heart disease -high blood pressure -if you frequently drink alcohol containing drinks -kidney disease -liver disease -open or infected skin -stomach problems -swelling or open sores at the treatment site -recent or planned coronary artery bypass graft (CABG) surgery -an unusual or allergic reaction to fluorouracil, diclofenac, aspirin, other NSAIDs, other medicines, foods, dyes, or preservatives -pregnant or trying to get pregnant -breast-feeding How should I use this medicine? This medicine is only for use on the skin. Follow the directions on the prescription label. Wash hands before and after use. Wash affected area and gently pat dry. To apply this  medicine use a cotton-tipped applicator, or use gloves if applying with fingertips. If applied with unprotected fingertips, it is very important to wash your hands well after you apply this medicine. Avoid applying to the eyes, nose, or mouth. Apply enough medicine to cover the affected area. You can cover the area with a light gauze dressing, but do not use tight or air-tight dressings. Finish the full course prescribed by your doctor or health care professional, even if you think your condition is better. Do not stop taking except on the advice of your doctor or health care professional. Talk to your pediatrician regarding the use of this medicine in children. Special care may be needed. Overdosage: If you think you've taken too much of this medicine contact a poison control center or emergency room at once. Overdosage: If you think you have taken too much of this medicine contact a poison control center or emergency room at once. NOTE: This medicine is only for you. Do not share this medicine with others. What if I miss a dose? If you miss a dose, apply it as soon as you can. If it is almost time for your next dose, only use that dose. Do not apply extra doses. Contact your doctor or health care professional if you miss more than one dose. What may interact with this medicine? Interactions are not expected. Do not use any other skin products without telling your doctor or health care professional. This list   may not describe all possible interactions. Give your health care provider a list of all the medicines, herbs, non-prescription drugs, or dietary supplements you use. Also tell them if you smoke, drink alcohol, or use illegal drugs. Some items may interact with your medicine. What should I watch for while using this medicine? Visit your doctor or health care professional for checks on your progress. You will need to use this medicine for 2 to 6 weeks. This may be longer depending on the condition  being treated. You may not see full healing for another 1 to 2 months after you stop using the medicine. Treated areas of skin can look unsightly during and for several weeks after treatment with this medicine. This medicine can make you more sensitive to the sun. Keep out of the sun. If you cannot avoid being in the sun, wear protective clothing and use sunscreen. Do not use sun lamps or tanning beds/booths. Where should I keep my What side effects may I notice from receiving this medicine? Side effects that you should report to your doctor or health care professional as soon as possible: -allergic reactions like skin rash, itching or hives, swelling of the face, lips, or tongue -black or bloody stools, blood in the urine or vomit -blurred vision -chest pain -difficulty breathing or wheezing -redness, blistering, peeling or loosening of the skin, including inside the mouth -severe redness and swelling of normal skin -slurred speech or weakness on one side of the body -trouble passing urine or change in the amount of urine -unexplained weight gain or swelling -unusually weak or tired -yellowing of eyes or skin Side effects that usually do not require medical attention (Report these to your doctor or health care professional if they continue or are bothersome.): -increased sensitivity of the skin to sun and ultraviolet light -pain and burning of the affected area -scaling or swelling of the affected area -skin rash, itching of the affected area -tenderness This list may not describe all possible side effects. Call your doctor for medical advice about side effects. You may report side effects to FDA at 1-800-FDA-1088. Where should I keep my medicine? Keep out of the reach of children. Store at room temperature between 20 and 25 degrees C (68 and 77 degrees F). Throw away any unused medicine after the expiration date. NOTE: This sheet is a summary. It may not cover all possible information.  If you have questions about this medicine, talk to your doctor, pharmacist, or health care provider.  2015, Elsevier/Gold Standard. (2013-12-03 11:09:58)  

## 2014-10-03 NOTE — Progress Notes (Signed)
Neulasta injection given by flush nurse 

## 2014-10-13 ENCOUNTER — Other Ambulatory Visit: Payer: Self-pay | Admitting: Oncology

## 2014-10-14 ENCOUNTER — Ambulatory Visit (HOSPITAL_COMMUNITY)
Admission: RE | Admit: 2014-10-14 | Discharge: 2014-10-14 | Disposition: A | Discharge status: Home / Self Care | Payer: Medicaid Other | Source: Ambulatory Visit | Attending: Oncology | Admitting: Oncology

## 2014-10-14 ENCOUNTER — Encounter (HOSPITAL_COMMUNITY): Payer: Self-pay

## 2014-10-14 ENCOUNTER — Encounter: Payer: Self-pay | Admitting: Certified Registered Nurse Anesthetist

## 2014-10-14 DIAGNOSIS — Z9221 Personal history of antineoplastic chemotherapy: Secondary | ICD-10-CM | POA: Insufficient documentation

## 2014-10-14 DIAGNOSIS — I2699 Other pulmonary embolism without acute cor pulmonale: Secondary | ICD-10-CM | POA: Diagnosis not present

## 2014-10-14 DIAGNOSIS — C169 Malignant neoplasm of stomach, unspecified: Secondary | ICD-10-CM | POA: Diagnosis not present

## 2014-10-14 MED ORDER — IOHEXOL 300 MG/ML  SOLN
100.0000 mL | Freq: Once | INTRAMUSCULAR | Status: AC | PRN
Start: 2014-10-14 — End: 2014-10-14
  Administered 2014-10-14: 100 mL via INTRAVENOUS

## 2014-10-14 NOTE — Progress Notes (Unsigned)
DOS: 09/17/2014.  Verified with Drucie Ip RN, Leucovorin started the same time as CPT 11. Start time reentered. HL

## 2014-10-15 ENCOUNTER — Telehealth: Payer: Self-pay | Admitting: *Deleted

## 2014-10-15 ENCOUNTER — Ambulatory Visit: Payer: Medicaid Other

## 2014-10-15 ENCOUNTER — Other Ambulatory Visit (HOSPITAL_BASED_OUTPATIENT_CLINIC_OR_DEPARTMENT_OTHER): Payer: Medicaid Other

## 2014-10-15 ENCOUNTER — Ambulatory Visit (HOSPITAL_BASED_OUTPATIENT_CLINIC_OR_DEPARTMENT_OTHER): Payer: Medicaid Other | Admitting: Nurse Practitioner

## 2014-10-15 ENCOUNTER — Ambulatory Visit (HOSPITAL_BASED_OUTPATIENT_CLINIC_OR_DEPARTMENT_OTHER): Payer: Medicaid Other

## 2014-10-15 ENCOUNTER — Telehealth: Payer: Self-pay | Admitting: Nurse Practitioner

## 2014-10-15 ENCOUNTER — Ambulatory Visit: Payer: Medicaid Other | Admitting: Nutrition

## 2014-10-15 ENCOUNTER — Telehealth: Payer: Self-pay

## 2014-10-15 VITALS — BP 137/84 | HR 103 | Temp 98.4°F | Resp 18 | Ht 60.0 in | Wt 169.6 lb

## 2014-10-15 DIAGNOSIS — R197 Diarrhea, unspecified: Secondary | ICD-10-CM

## 2014-10-15 DIAGNOSIS — Z5111 Encounter for antineoplastic chemotherapy: Secondary | ICD-10-CM

## 2014-10-15 DIAGNOSIS — C169 Malignant neoplasm of stomach, unspecified: Secondary | ICD-10-CM

## 2014-10-15 DIAGNOSIS — I2699 Other pulmonary embolism without acute cor pulmonale: Secondary | ICD-10-CM

## 2014-10-15 DIAGNOSIS — E876 Hypokalemia: Secondary | ICD-10-CM

## 2014-10-15 DIAGNOSIS — Z95828 Presence of other vascular implants and grafts: Secondary | ICD-10-CM

## 2014-10-15 DIAGNOSIS — D702 Other drug-induced agranulocytosis: Secondary | ICD-10-CM

## 2014-10-15 DIAGNOSIS — R918 Other nonspecific abnormal finding of lung field: Secondary | ICD-10-CM

## 2014-10-15 LAB — COMPREHENSIVE METABOLIC PANEL (CC13)
ALBUMIN: 3.5 g/dL (ref 3.5–5.0)
ALT: 10 U/L (ref 0–55)
ANION GAP: 13 meq/L — AB (ref 3–11)
AST: 13 U/L (ref 5–34)
Alkaline Phosphatase: 157 U/L — ABNORMAL HIGH (ref 40–150)
BUN: 8.4 mg/dL (ref 7.0–26.0)
CALCIUM: 9.1 mg/dL (ref 8.4–10.4)
CHLORIDE: 105 meq/L (ref 98–109)
CO2: 25 meq/L (ref 22–29)
Creatinine: 0.7 mg/dL (ref 0.6–1.1)
GLUCOSE: 121 mg/dL (ref 70–140)
POTASSIUM: 2.9 meq/L — AB (ref 3.5–5.1)
Sodium: 142 mEq/L (ref 136–145)
Total Bilirubin: 0.39 mg/dL (ref 0.20–1.20)
Total Protein: 6.1 g/dL — ABNORMAL LOW (ref 6.4–8.3)

## 2014-10-15 LAB — CBC WITH DIFFERENTIAL/PLATELET
BASO%: 0.4 % (ref 0.0–2.0)
Basophils Absolute: 0 10*3/uL (ref 0.0–0.1)
EOS ABS: 0 10*3/uL (ref 0.0–0.5)
EOS%: 0.3 % (ref 0.0–7.0)
HEMATOCRIT: 30.8 % — AB (ref 34.8–46.6)
HEMOGLOBIN: 9.3 g/dL — AB (ref 11.6–15.9)
LYMPH%: 12.8 % — ABNORMAL LOW (ref 14.0–49.7)
MCH: 28.5 pg (ref 25.1–34.0)
MCHC: 30.2 g/dL — ABNORMAL LOW (ref 31.5–36.0)
MCV: 94.3 fL (ref 79.5–101.0)
MONO#: 1 10*3/uL — ABNORMAL HIGH (ref 0.1–0.9)
MONO%: 7.4 % (ref 0.0–14.0)
NEUT#: 10.2 10*3/uL — ABNORMAL HIGH (ref 1.5–6.5)
NEUT%: 79.1 % — ABNORMAL HIGH (ref 38.4–76.8)
Platelets: 281 10*3/uL (ref 145–400)
RBC: 3.27 10*6/uL — ABNORMAL LOW (ref 3.70–5.45)
RDW: 24.6 % — AB (ref 11.2–14.5)
WBC: 12.9 10*3/uL — ABNORMAL HIGH (ref 3.9–10.3)
lymph#: 1.7 10*3/uL (ref 0.9–3.3)

## 2014-10-15 LAB — PROTIME-INR
INR: 3 (ref 2.00–3.50)
Protime: 36 Seconds — ABNORMAL HIGH (ref 10.6–13.4)

## 2014-10-15 MED ORDER — DEXAMETHASONE SODIUM PHOSPHATE 20 MG/5ML IJ SOLN
INTRAMUSCULAR | Status: AC
  Filled 2014-10-15: qty 5

## 2014-10-15 MED ORDER — SODIUM CHLORIDE 0.9 % IV SOLN
150.0000 mg | Freq: Once | INTRAVENOUS | Status: AC
  Administered 2014-10-15: 150 mg via INTRAVENOUS
  Filled 2014-10-15: qty 5

## 2014-10-15 MED ORDER — PALONOSETRON HCL INJECTION 0.25 MG/5ML
0.2500 mg | Freq: Once | INTRAVENOUS | Status: AC
  Administered 2014-10-15: 0.25 mg via INTRAVENOUS

## 2014-10-15 MED ORDER — ATROPINE SULFATE 1 MG/ML IJ SOLN
INTRAMUSCULAR | Status: AC
  Filled 2014-10-15: qty 1

## 2014-10-15 MED ORDER — DEXAMETHASONE SODIUM PHOSPHATE 20 MG/5ML IJ SOLN
12.0000 mg | Freq: Once | INTRAMUSCULAR | Status: AC
  Administered 2014-10-15: 12 mg via INTRAVENOUS

## 2014-10-15 MED ORDER — SODIUM CHLORIDE 0.9 % IJ SOLN
10.0000 mL | INTRAMUSCULAR | Status: DC | PRN
  Administered 2014-10-15: 10 mL via INTRAVENOUS
  Filled 2014-10-15: qty 10

## 2014-10-15 MED ORDER — SODIUM CHLORIDE 0.9 % IV SOLN
Freq: Once | INTRAVENOUS | Status: AC
  Administered 2014-10-15: 12:00:00 via INTRAVENOUS

## 2014-10-15 MED ORDER — ATROPINE SULFATE 1 MG/ML IJ SOLN
0.5000 mg | Freq: Once | INTRAMUSCULAR | Status: AC | PRN
  Administered 2014-10-15: 0.5 mg via INTRAVENOUS

## 2014-10-15 MED ORDER — FLUOROURACIL CHEMO INJECTION 5 GM/100ML
2400.0000 mg/m2 | INTRAVENOUS | Status: DC
  Administered 2014-10-15: 4600 mg via INTRAVENOUS
  Filled 2014-10-15: qty 92

## 2014-10-15 MED ORDER — OXYCODONE HCL 5 MG PO TABS: ORAL_TABLET | ORAL | Status: DC

## 2014-10-15 MED ORDER — DEXTROSE 5 % IV SOLN
180.0000 mg/m2 | Freq: Once | INTRAVENOUS | Status: AC
  Administered 2014-10-15: 346 mg via INTRAVENOUS
  Filled 2014-10-15: qty 17.3

## 2014-10-15 MED ORDER — PALONOSETRON HCL INJECTION 0.25 MG/5ML
INTRAVENOUS | Status: AC
  Filled 2014-10-15: qty 5

## 2014-10-15 MED ORDER — DIPHENOXYLATE-ATROPINE 2.5-0.025 MG PO TABS: 2.0000 | ORAL_TABLET | Freq: Four times a day (QID) | ORAL | Status: DC | PRN

## 2014-10-15 MED ORDER — LEUCOVORIN CALCIUM INJECTION 350 MG
400.0000 mg/m2 | Freq: Once | INTRAVENOUS | Status: AC
  Administered 2014-10-15: 768 mg via INTRAVENOUS
  Filled 2014-10-15: qty 38.4

## 2014-10-15 MED ORDER — FLUOROURACIL CHEMO INJECTION 2.5 GM/50ML
400.0000 mg/m2 | Freq: Once | INTRAVENOUS | Status: AC
  Administered 2014-10-15: 750 mg via INTRAVENOUS
  Filled 2014-10-15: qty 15

## 2014-10-15 MED ORDER — POTASSIUM CHLORIDE CRYS ER 20 MEQ PO TBCR: EXTENDED_RELEASE_TABLET | ORAL | Status: DC

## 2014-10-15 NOTE — Telephone Encounter (Signed)
Called to inform pt [facility] does not carry Lomotil. Medication was sent to [facility]. Pt did not answer and unable to reach Negley, the interpreter at this time. [facility] pharmacy will call pt when this medication is ready and will make sure pt understand when she come sin for pump d/c on 3/3.

## 2014-10-15 NOTE — Patient Instructions (Addendum)
[facility] Discharge Instructions for Patients Receiving Chemotherapy  Today you received the following chemotherapy agents: Irinotecan, Leucovorin and 5FU.  To help prevent nausea and vomiting after your treatment, we encourage you to take your nausea medication:Compazine 10 mg every 6 hours as needed; Zofran 4 mg every 6 hours as needed.   If you develop nausea and vomiting that is not controlled by your nausea medication, call the clinic.   BELOW ARE SYMPTOMS THAT SHOULD BE REPORTED IMMEDIATELY:  *FEVER GREATER THAN 100.5 F  *CHILLS WITH OR WITHOUT FEVER  NAUSEA AND VOMITING THAT IS NOT CONTROLLED WITH YOUR NAUSEA MEDICATION  *UNUSUAL SHORTNESS OF BREATH  *UNUSUAL BRUISING OR BLEEDING  TENDERNESS IN MOUTH AND THROAT WITH OR WITHOUT PRESENCE OF ULCERS  *URINARY PROBLEMS  *BOWEL PROBLEMS  UNUSUAL RASH Items with * indicate a potential emergency and should be followed up as soon as possible.  Feel free to call the clinic you have any questions or concerns. The clinic phone number is [phone].

## 2014-10-15 NOTE — Telephone Encounter (Signed)
Per staff message and POF I have scheduled appts. Advised scheduler of appts. JMW  

## 2014-10-15 NOTE — Progress Notes (Addendum)
[facility] OFFICE PROGRESS NOTE   Diagnosis:  Gastric cancer  INTERVAL HISTORY:   Ms. Rylander returns as scheduled. She completed cycle 5 FOLFIRI 10/01/2014. She denies nausea/vomiting. No mouth sores. She continues to have diarrhea. She has noted no improvement with Imodium. Abdominal pain continues to be improved.  Objective:  Vital signs in last 24 hours:  Blood pressure 137/84, pulse 103, temperature 98.4 F (36.9 C), temperature source Oral, resp. rate 18, height 5' (1.524 m), weight 169 lb 9.6 oz (76.93 kg), SpO2 98 %.    HEENT: No thrush or ulcers. Resp: Lungs clear bilaterally. Cardio: Regular rate and rhythm. GI: Abdomen soft and nontender. No hepatomegaly. Fullness just superior to the umbilicus. No definite mass. Vascular: No leg edema. Port-A-Cath without erythema.   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 Chemotherapy administered 2 weeks ago. Subsequent encounter.  EXAM: CT CHEST, ABDOMEN, AND PELVIS WITH CONTRAST  TECHNIQUE: Multidetector CT imaging of the chest, abdomen and pelvis was performed following the standard protocol during bolus administration of intravenous contrast.  CONTRAST:  163mL OMNIPAQUE IOHEXOL 300 MG/ML  SOLN  COMPARISON:  CTs 07/22/2014.  FINDINGS: CT CHEST FINDINGS  Mediastinum/Nodes: There are no enlarged mediastinal, hilar or axillary lymph nodes. The thyroid gland, trachea and esophagus demonstrate no significant findings. The heart size is normal. There is no pericardial effusion.Stable mild aortic atherosclerosis. Right IJ Port-A-Cath tip is at the SVC right atrial junction.  Lungs/Pleura: There is no pleural effusion.Compared with the prior  examination, the overall pulmonary aeration has improved. Sub solid nodule along the superior aspect of the right major fissure measures 1.5 x 1.0 cm on image 18. This is smaller and less dense than on the prior study. The subpleural nodule previously noted in the lingula has nearly completely resolved (image number 28). There are few additional scattered small nodules and ground-glass densities, but no enlarging nodules are identified.  Musculoskeletal/Chest wall: No chest wall lesion or new osseous finding. There is progressive sclerosis of the left humeral head without subchondral collapse.  CT ABDOMEN AND PELVIS FINDINGS  Hepatobiliary: The liver is normal in density without focal abnormality. No evidence of gallstones or gallbladder wall thickening. There is stable mild extrahepatic biliary dilatation.  Pancreas: Unremarkable. No pancreatic ductal dilatation or surrounding inflammatory changes.  Spleen: Normal in size without focal abnormality.  Adrenals/Urinary Tract: Both adrenal glands appear normal.Stable renal cortical lobulated. No suspicious renal lesion or hydronephrosis. The bladder is incompletely distended without suspicious finding.  Stomach/Bowel: Postsurgical changes and mild bowel wall thickening in the pelvis are stable. There is no evidence of bowel obstruction. The appendix appears normal. Again demonstrated are multiple upper omental nodules, including a 12 mm nodule adjacent to the stomach on image 51 (unchanged) and a 13 mm nodule more inferiorly in the midline on image 59, enlarged from the prior study. Nodularity within the lower abdominal and mesenteric peritoneum is unchanged. There is no ascites.  Vascular/Lymphatic: There are no enlarged abdominal or pelvic lymph nodes. No significant vascular findings are present.  Reproductive: Status post hysterectomy. No evidence of adnexal mass.  Other: There has been some involution of the previously demonstrated ill-defined soft  tissue mass  involving the anterior abdominal wall incision. No enlarging masses or hernias identified.  Musculoskeletal: There is a new sclerotic 11 mm lesion anteriorly in the right iliac bone on image number 94. Small sclerotic lesion is also noted in the right pubic bone adjacent to the symphysis pubis on image 107. No evidence of pathologic fracture or epidural tumor.  IMPRESSION: 1. Overall improvement in pulmonary aeration with decreased nodularity in both lungs. The remaining nodules could reflect resolving postinflammatory scarring. Appearance is not typical for metastatic disease, although continued follow up recommended to exclude atypical adenomatous hyperplasia. 2. Persistent omental nodularity consistent with metastatic disease. Most of the nodules are unchanged, although at least 1 nodule in the upper omentum has slightly enlarged. The nodularity within the anterior abdominal wall adjacent the incision appear slightly improved. There is no ascites or bowel obstruction. 3. 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 Possible metastasis versus bone infarct in the left humeral head. 4. No evidence of hepatic metastatic disease.   Electronically Signed   By: Richardean Sale M.D.   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 Chemotherapy administered 2 weeks ago. Subsequent encounter.  EXAM: CT CHEST, ABDOMEN, AND PELVIS WITH CONTRAST  TECHNIQUE: Multidetector CT imaging of the chest, abdomen and pelvis was performed following the standard protocol during bolus administration of intravenous contrast.  CONTRAST:  176mL OMNIPAQUE IOHEXOL 300 MG/ML  SOLN  COMPARISON:  CTs 07/22/2014.  FINDINGS: CT CHEST FINDINGS  Mediastinum/Nodes: There are no enlarged mediastinal, hilar or axillary lymph nodes. The thyroid gland, trachea and esophagus demonstrate no significant findings. The heart size is normal. There  is no pericardial effusion.Stable mild aortic atherosclerosis. Right IJ Port-A-Cath tip is at the SVC right atrial junction.  Lungs/Pleura: There is no pleural effusion.Compared with the prior examination, the overall pulmonary aeration has improved. Sub solid nodule along the superior aspect of the right major fissure measures 1.5 x 1.0 cm on image 18. This is smaller and less dense than on the prior study. The subpleural nodule previously noted in the lingula has nearly completely resolved (image number 28). There are few additional scattered small nodules and ground-glass densities, but no enlarging nodules are identified.  Musculoskeletal/Chest wall: No chest wall lesion or new osseous finding. There is progressive sclerosis of the left humeral head without subchondral collapse.  CT ABDOMEN AND PELVIS FINDINGS  Hepatobiliary: The liver is normal in density without focal abnormality. No evidence of gallstones or gallbladder wall thickening. There is stable mild extrahepatic biliary dilatation.  Pancreas: Unremarkable. No pancreatic ductal dilatation or surrounding inflammatory changes.  Spleen: Normal in size without focal abnormality.  Adrenals/Urinary Tract: Both adrenal glands appear normal.Stable renal cortical lobulated. No suspicious renal lesion or hydronephrosis. The bladder is incompletely distended without suspicious finding.  Stomach/Bowel: Postsurgical changes and mild bowel wall thickening in the pelvis are stable. There is no evidence of bowel obstruction. The appendix appears normal. Again demonstrated are multiple upper omental nodules, including a 12 mm nodule adjacent to the stomach on image 51 (unchanged) and a 13 mm nodule more inferiorly in the midline on image 59, enlarged from the prior study. Nodularity within the lower abdominal and mesenteric peritoneum is unchanged. There is no ascites.  Vascular/Lymphatic: There are no enlarged abdominal or pelvic lymph nodes. No significant vascular  findings are present.  Reproductive: Status post hysterectomy. No evidence of adnexal  mass.  Other: There has been some involution of the previously demonstrated ill-defined soft tissue mass involving the anterior abdominal wall incision. No enlarging masses or hernias identified.  Musculoskeletal: There is a new sclerotic 11 mm lesion anteriorly in the right iliac bone on image number 94. Small sclerotic lesion is also noted in the right pubic bone adjacent to the symphysis pubis on image 107. No evidence of pathologic fracture or epidural tumor.  IMPRESSION: 1. Overall improvement in pulmonary aeration with decreased nodularity in both lungs. The remaining nodules could reflect resolving postinflammatory scarring. Appearance is not typical for metastatic disease, although continued follow up recommended to exclude atypical adenomatous hyperplasia. 2. Persistent omental nodularity consistent with metastatic disease. Most of the nodules are unchanged, although at least 1 nodule in the upper omentum has slightly enlarged. The nodularity within the anterior abdominal wall adjacent the incision appear slightly improved. There is no ascites or bowel obstruction. 3. 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 Possible metastasis versus bone infarct in the left humeral head. 4. No evidence of hepatic metastatic disease.   Electronically Signed   By: Richardean Sale M.D.   On: 10/14/2014 12:42    Medications: I have reviewed the patient's current medications.  Assessment/Plan: 1. Stage IV gastric cancer  Status post bilateral oophorectomy, total abdominal hysterectomy, omentectomy, resection of abdominal wall and pelvic masses, bilateral pelvic lymphadenectomy 10/18/2013 with the pathology confirming moderate to poorly differentiated adenocarcinoma  Stomach biopsy 11/28/2013 revealed adenocarcinoma  Status post 12 cycles of FOLFOX, last given 04/25/2014  Restaging CT 04/29/2014 with  thickening of the stomach and nodular densities adjacent to the greater curvature-unchanged, faint groundglass nodularity in the lungs-nonspecific  Restaging CT 07/22/2014 with indeterminate lung nodules; moderate wall thickening in the body region of the stomach; fairly extensive omental disease and findings suspicious for abdominal wall and incisional disease.  Started systemic chemotherapy with FOLFIRI on 07/30/14.  Restaging CT evaluation 10/14/2014 following 5 cycles of FOLFIRI showed decreased nodularity in both lungs; overall stable omental nodularity; involution of the previously demonstrated ill-defined soft tissue mass involving the anterior abdominal wall incision.  Cycle 6 FOLFIRI 10/15/2014  2. Oxaliplatin neuropathy  3. Bilateral upper and lower lobe pulmonary emboli within segmental branches-now maintained on Coumadin. Followed by Coumadin clinic per PCP.  4. Neutropenia related to chemotherapy 08/13/2014. Cycle 2 FOLFIRI held. Neulasta added beginning with cycle 2 on 08/20/2014.  5. Nausea following chemotherapy-the anti-emetic regimen was adjusted with cycle 4 FOLFIRI 09/17/2014  6. Diarrhea following FOLFIRI-no improvement with Imodium. Lomotil initiated 10/15/2014.  Disposition: Ms. Pierrelouis appears stable. She has completed 5 cycles of FOLFIRI. Restaging CT evaluation on 10/14/2014 showed improvement. Plan to proceed with cycle 6 today as scheduled.   The diarrhea is likely related to irinotecan. A prescription was sent to her pharmacy for Lomotil 2 tablets 4 times daily as needed. She understands to contact the office if this is not effective.  The hypokalemia is due to the diarrhea. A prescription was called to her pharmacy for K-Dur.  She will return for a follow-up visit and cycle 7 FOLFIRI in 2 weeks. She will contact the office in the interim as outlined above or with any other problems.  Patient seen with Dr. Benay Spice.  Ned Card ANP/GNP-BC    10/15/2014  10:32 AM  This was a shared visit with Ned Card. The abdominal pain has improved and the periumbilical mass is smaller. I reviewed the CT images. It appears the dominant mass near the umbilicus  has decreased in size. The plan is to continue FOLFIRI. I will present her case at the GI tumor conference 10/23/2014.  Julieanne Manson, M.D.

## 2014-10-15 NOTE — Telephone Encounter (Signed)
Pt confirmed labs/ov per 02/29 POF, gave pt AVS.... KJ,  sent msg to add chemo °

## 2014-10-15 NOTE — Progress Notes (Signed)
Nutrition follow-up completed with patient and husband along with interpreter. Patient has experienced increased nausea and diarrhea. Patient states drinking 4 ounces of Ensure plus at a time has improved tolerance. Weight decreased and documented as 169.6 pounds March 1, down from 175 pounds February 16.  Nutrition diagnosis: Unintended weight loss continues.  Intervention: Patient was encouraged to continue small frequent meals and snacks. Recommended patient drink 4 ounces of Ensure Plus twice a day. Recommended patient take medications for nausea and diarrhea as prescribed by physician. Provided samples of oral nutrition supplements. Teach back method used.  Monitoring, evaluation, goals: Patient will work to increase oral intake to minimize further weight loss.  Next visit: Follow-up has not been scheduled at this time.  Patient has my contact information for questions or concerns.  **Disclaimer: This note was dictated with voice recognition software. Similar sounding words can inadvertently be transcribed and this note may contain transcription errors which may not have been corrected upon publication of note.**

## 2014-10-16 NOTE — Progress Notes (Signed)
Correction made to entry on 10/11/2014 Leucovorin.  Pt. Did not receive this drug on 10/11/2014. HL

## 2014-10-17 ENCOUNTER — Ambulatory Visit (HOSPITAL_BASED_OUTPATIENT_CLINIC_OR_DEPARTMENT_OTHER): Payer: Medicaid Other

## 2014-10-17 DIAGNOSIS — D701 Agranulocytosis secondary to cancer chemotherapy: Secondary | ICD-10-CM

## 2014-10-17 DIAGNOSIS — Z452 Encounter for adjustment and management of vascular access device: Secondary | ICD-10-CM

## 2014-10-17 DIAGNOSIS — C169 Malignant neoplasm of stomach, unspecified: Secondary | ICD-10-CM

## 2014-10-17 MED ORDER — SODIUM CHLORIDE 0.9 % IJ SOLN
10.0000 mL | INTRAMUSCULAR | Status: DC | PRN
  Administered 2014-10-17: 10 mL
  Filled 2014-10-17: qty 10

## 2014-10-17 MED ORDER — PEGFILGRASTIM INJECTION 6 MG/0.6ML ~~LOC~~
6.0000 mg | PREFILLED_SYRINGE | Freq: Once | SUBCUTANEOUS | Status: AC
  Administered 2014-10-17: 6 mg via SUBCUTANEOUS
  Filled 2014-10-17: qty 0.6

## 2014-10-17 MED ORDER — HEPARIN SOD (PORK) LOCK FLUSH 100 UNIT/ML IV SOLN
500.0000 [IU] | Freq: Once | INTRAVENOUS | Status: AC | PRN
  Administered 2014-10-17: 500 [IU]
  Filled 2014-10-17: qty 5

## 2014-10-17 NOTE — Patient Instructions (Signed)
Pegfilgrastim injection What is this medicine? PEGFILGRASTIM (peg fil GRA stim) is a long-acting granulocyte colony-stimulating factor that stimulates the growth of neutrophils, a type of white blood cell important in the body's fight against infection. It is used to reduce the incidence of fever and infection in patients with certain types of cancer who are receiving chemotherapy that affects the bone marrow. This medicine may be used for other purposes; ask your health care provider or pharmacist if you have questions. COMMON BRAND NAME(S): Neulasta What should I tell my health care provider before I take this medicine? They need to know if you have any of these conditions: -latex allergy -ongoing radiation therapy -sickle cell disease -skin reactions to acrylic adhesives (On-Body Injector only) -an unusual or allergic reaction to pegfilgrastim, filgrastim, other medicines, foods, dyes, or preservatives -pregnant or trying to get pregnant -breast-feeding How should I use this medicine? This medicine is for injection under the skin. If you get this medicine at home, you will be taught how to prepare and give the pre-filled syringe or how to use the On-body Injector. Refer to the patient Instructions for Use for detailed instructions. Use exactly as directed. Take your medicine at regular intervals. Do not take your medicine more often than directed. It is important that you put your used needles and syringes in a special sharps container. Do not put them in a trash can. If you do not have a sharps container, call your pharmacist or healthcare provider to get one. Talk to your pediatrician regarding the use of this medicine in children. Special care may be needed. Overdosage: If you think you have taken too much of this medicine contact a poison control center or emergency room at once. NOTE: This medicine is only for you. Do not share this medicine with others. What if I miss a dose? It is  important not to miss your dose. Call your doctor or health care professional if you miss your dose. If you miss a dose due to an On-body Injector failure or leakage, a new dose should be administered as soon as possible using a single prefilled syringe for manual use. What may interact with this medicine? Interactions have not been studied. Give your health care provider a list of all the medicines, herbs, non-prescription drugs, or dietary supplements you use. Also tell them if you smoke, drink alcohol, or use illegal drugs. Some items may interact with your medicine. This list may not describe all possible interactions. Give your health care provider a list of all the medicines, herbs, non-prescription drugs, or dietary supplements you use. Also tell them if you smoke, drink alcohol, or use illegal drugs. Some items may interact with your medicine. What should I watch for while using this medicine? You may need blood work done while you are taking this medicine. If you are going to need a MRI, CT scan, or other procedure, tell your doctor that you are using this medicine (On-Body Injector only). What side effects may I notice from receiving this medicine? Side effects that you should report to your doctor or health care professional as soon as possible: -allergic reactions like skin rash, itching or hives, swelling of the face, lips, or tongue -dizziness -fever -pain, redness, or irritation at site where injected -pinpoint red spots on the skin -shortness of breath or breathing problems -stomach or side pain, or pain at the shoulder -swelling -tiredness -trouble passing urine Side effects that usually do not require medical attention (report to your doctor   or health care professional if they continue or are bothersome): -bone pain -muscle pain This list may not describe all possible side effects. Call your doctor for medical advice about side effects. You may report side effects to FDA at  1-800-FDA-1088. Where should I keep my medicine? Keep out of the reach of children. Store pre-filled syringes in a refrigerator between 2 and 8 degrees C (36 and 46 degrees F). Do not freeze. Keep in carton to protect from light. Throw away this medicine if it is left out of the refrigerator for more than 48 hours. Throw away any unused medicine after the expiration date. NOTE: This sheet is a summary. It may not cover all possible information. If you have questions about this medicine, talk to your doctor, pharmacist, or health care provider.  2015, [employer]. (2013-11-01 16:14:05)  

## 2014-10-27 ENCOUNTER — Other Ambulatory Visit: Payer: Self-pay | Admitting: Oncology

## 2014-10-29 ENCOUNTER — Other Ambulatory Visit: Payer: Self-pay | Admitting: Nurse Practitioner

## 2014-10-29 ENCOUNTER — Ambulatory Visit (HOSPITAL_BASED_OUTPATIENT_CLINIC_OR_DEPARTMENT_OTHER): Payer: Medicaid Other

## 2014-10-29 ENCOUNTER — Other Ambulatory Visit (HOSPITAL_BASED_OUTPATIENT_CLINIC_OR_DEPARTMENT_OTHER): Payer: Self-pay

## 2014-10-29 ENCOUNTER — Telehealth: Payer: Self-pay | Admitting: *Deleted

## 2014-10-29 ENCOUNTER — Telehealth: Payer: Self-pay | Admitting: Nurse Practitioner

## 2014-10-29 ENCOUNTER — Other Ambulatory Visit (HOSPITAL_COMMUNITY)
Admission: AD | Admit: 2014-10-29 | Discharge: 2014-10-29 | Disposition: A | Discharge status: Home / Self Care | Payer: Medicaid Other | Source: Ambulatory Visit | Attending: Oncology | Admitting: Oncology

## 2014-10-29 ENCOUNTER — Ambulatory Visit: Payer: Medicaid Other

## 2014-10-29 ENCOUNTER — Ambulatory Visit (HOSPITAL_BASED_OUTPATIENT_CLINIC_OR_DEPARTMENT_OTHER): Payer: Medicaid Other | Admitting: Nurse Practitioner

## 2014-10-29 VITALS — BP 144/79 | HR 75 | Temp 98.6°F | Resp 18 | Ht 60.0 in | Wt 173.0 lb

## 2014-10-29 DIAGNOSIS — C169 Malignant neoplasm of stomach, unspecified: Secondary | ICD-10-CM

## 2014-10-29 DIAGNOSIS — R112 Nausea with vomiting, unspecified: Secondary | ICD-10-CM

## 2014-10-29 DIAGNOSIS — D701 Agranulocytosis secondary to cancer chemotherapy: Secondary | ICD-10-CM

## 2014-10-29 DIAGNOSIS — G620 Drug-induced polyneuropathy: Secondary | ICD-10-CM

## 2014-10-29 DIAGNOSIS — Z95828 Presence of other vascular implants and grafts: Secondary | ICD-10-CM

## 2014-10-29 DIAGNOSIS — R197 Diarrhea, unspecified: Secondary | ICD-10-CM

## 2014-10-29 DIAGNOSIS — R918 Other nonspecific abnormal finding of lung field: Secondary | ICD-10-CM

## 2014-10-29 DIAGNOSIS — Z5111 Encounter for antineoplastic chemotherapy: Secondary | ICD-10-CM

## 2014-10-29 LAB — CBC WITH DIFFERENTIAL/PLATELET
BASO%: 0.5 % (ref 0.0–2.0)
Basophils Absolute: 0.1 10*3/uL (ref 0.0–0.1)
EOS ABS: 0 10*3/uL (ref 0.0–0.5)
EOS%: 0.3 % (ref 0.0–7.0)
HCT: 29.8 % — ABNORMAL LOW (ref 34.8–46.6)
HGB: 9.3 g/dL — ABNORMAL LOW (ref 11.6–15.9)
LYMPH%: 13 % — AB (ref 14.0–49.7)
MCH: 30.6 pg (ref 25.1–34.0)
MCHC: 31.2 g/dL — ABNORMAL LOW (ref 31.5–36.0)
MCV: 98 fL (ref 79.5–101.0)
MONO#: 1.3 10*3/uL — AB (ref 0.1–0.9)
MONO%: 10.2 % (ref 0.0–14.0)
NEUT%: 76 % (ref 38.4–76.8)
NEUTROS ABS: 9.7 10*3/uL — AB (ref 1.5–6.5)
PLATELETS: 206 10*3/uL (ref 145–400)
RBC: 3.04 10*6/uL — ABNORMAL LOW (ref 3.70–5.45)
RDW: 22.9 % — ABNORMAL HIGH (ref 11.2–14.5)
WBC: 12.8 10*3/uL — ABNORMAL HIGH (ref 3.9–10.3)
lymph#: 1.7 10*3/uL (ref 0.9–3.3)

## 2014-10-29 LAB — COMPREHENSIVE METABOLIC PANEL (CC13)
ALBUMIN: 3.5 g/dL (ref 3.5–5.0)
ALT: 11 U/L (ref 0–55)
ANION GAP: 10 meq/L (ref 3–11)
AST: 15 U/L (ref 5–34)
Alkaline Phosphatase: 147 U/L (ref 40–150)
BUN: 6.3 mg/dL — ABNORMAL LOW (ref 7.0–26.0)
CALCIUM: 8.9 mg/dL (ref 8.4–10.4)
CHLORIDE: 105 meq/L (ref 98–109)
CO2: 27 meq/L (ref 22–29)
CREATININE: 0.6 mg/dL (ref 0.6–1.1)
EGFR: >90 mL/min/{1.73_m2} (ref >90)
Glucose: 96 mg/dl (ref 70–140)
Potassium: 3.8 mEq/L (ref 3.5–5.1)
Sodium: 143 mEq/L (ref 136–145)
Total Bilirubin: 0.47 mg/dL (ref 0.20–1.20)
Total Protein: 6 g/dL — ABNORMAL LOW (ref 6.4–8.3)

## 2014-10-29 LAB — PROTIME-INR
INR: 1.14 (ref 0.00–1.49)
Prothrombin Time: 14.7 seconds (ref 11.6–15.2)

## 2014-10-29 MED ORDER — OXYCODONE HCL 5 MG PO TABS: ORAL_TABLET | ORAL | Status: DC

## 2014-10-29 MED ORDER — PALONOSETRON HCL INJECTION 0.25 MG/5ML
INTRAVENOUS | Status: AC
  Filled 2014-10-29: qty 5

## 2014-10-29 MED ORDER — FOSAPREPITANT DIMEGLUMINE INJECTION 150 MG
Freq: Once | INTRAVENOUS | Status: AC
  Administered 2014-10-29: 11:00:00 via INTRAVENOUS
  Filled 2014-10-29: qty 5

## 2014-10-29 MED ORDER — SODIUM CHLORIDE 0.9 % IJ SOLN
10.0000 mL | INTRAMUSCULAR | Status: DC | PRN
  Administered 2014-10-29: 10 mL via INTRAVENOUS
  Filled 2014-10-29: qty 10

## 2014-10-29 MED ORDER — HEPARIN SOD (PORK) LOCK FLUSH 100 UNIT/ML IV SOLN
500.0000 [IU] | Freq: Once | INTRAVENOUS | Status: AC
  Administered 2014-10-29: 500 [IU] via INTRAVENOUS
  Filled 2014-10-29: qty 5

## 2014-10-29 MED ORDER — LIDOCAINE-PRILOCAINE 2.5-2.5 % EX CREA: 1.0000 "application " | TOPICAL_CREAM | CUTANEOUS | Status: DC | PRN

## 2014-10-29 MED ORDER — SODIUM CHLORIDE 0.9 % IV SOLN
Freq: Once | INTRAVENOUS | Status: AC
  Administered 2014-10-29: 11:00:00 via INTRAVENOUS

## 2014-10-29 MED ORDER — SODIUM CHLORIDE 0.9 % IJ SOLN
10.0000 mL | INTRAMUSCULAR | Status: DC | PRN
Start: 2014-10-29 — End: 2014-10-29
  Filled 2014-10-29: qty 10

## 2014-10-29 MED ORDER — FLUOROURACIL CHEMO INJECTION 2.5 GM/50ML
400.0000 mg/m2 | Freq: Once | INTRAVENOUS | Status: AC
  Administered 2014-10-29: 750 mg via INTRAVENOUS
  Filled 2014-10-29: qty 15

## 2014-10-29 MED ORDER — FLUOROURACIL CHEMO INJECTION 5 GM/100ML
2400.0000 mg/m2 | INTRAVENOUS | Status: DC
  Administered 2014-10-29: 4600 mg via INTRAVENOUS
  Filled 2014-10-29: qty 92

## 2014-10-29 MED ORDER — LORAZEPAM 2 MG/ML IJ SOLN
1.0000 mg | Freq: Once | INTRAMUSCULAR | Status: AC
Start: 2014-10-29 — End: 2014-10-29
  Administered 2014-10-29: 1 mg via INTRAVENOUS

## 2014-10-29 MED ORDER — HEPARIN SOD (PORK) LOCK FLUSH 100 UNIT/ML IV SOLN
500.0000 [IU] | Freq: Once | INTRAVENOUS | Status: DC | PRN
  Filled 2014-10-29: qty 5

## 2014-10-29 MED ORDER — PALONOSETRON HCL INJECTION 0.25 MG/5ML
0.2500 mg | Freq: Once | INTRAVENOUS | Status: AC
  Administered 2014-10-29: 0.25 mg via INTRAVENOUS

## 2014-10-29 MED ORDER — LEUCOVORIN CALCIUM INJECTION 350 MG
400.0000 mg/m2 | Freq: Once | INTRAVENOUS | Status: AC
  Administered 2014-10-29: 768 mg via INTRAVENOUS
  Filled 2014-10-29: qty 38.4

## 2014-10-29 MED ORDER — IRINOTECAN HCL CHEMO INJECTION 100 MG/5ML
180.0000 mg/m2 | Freq: Once | INTRAVENOUS | Status: AC
  Administered 2014-10-29: 346 mg via INTRAVENOUS
  Filled 2014-10-29: qty 17.3

## 2014-10-29 MED ORDER — LORAZEPAM 2 MG/ML IJ SOLN
INTRAMUSCULAR | Status: AC
Start: 2014-10-29 — End: 2014-10-29
  Filled 2014-10-29: qty 1

## 2014-10-29 NOTE — Progress Notes (Signed)
Patient very nauseous and vomiting on arrival to infusion room.  Ned Card notified.  1 mg IV Ativan ordered and administered.

## 2014-10-29 NOTE — Telephone Encounter (Signed)
Pt confirmed labs/ov per 03/15 POF, gave pt AVS and calendar.... KJ, sent msg to add chemo

## 2014-10-29 NOTE — Patient Instructions (Signed)
RETURN TO CLINIC FOR PUMP DISCONNECT Thursday AT   [facility] Discharge Instructions for Patients Receiving Chemotherapy  Today you received the following chemotherapy agents: irinotecan, leucovorin, 27fu  To help prevent nausea and vomiting after your treatment, we encourage you to take your nausea medication.  Take it as often as prescribed.     If you develop nausea and vomiting that is not controlled by your nausea medication, call the clinic. If it is after clinic hours your family physician or the after hours number for the clinic or go to the Emergency Department.   BELOW ARE SYMPTOMS THAT SHOULD BE REPORTED IMMEDIATELY:  *FEVER GREATER THAN 100.5 F  *CHILLS WITH OR WITHOUT FEVER  NAUSEA AND VOMITING THAT IS NOT CONTROLLED WITH YOUR NAUSEA MEDICATION  *UNUSUAL SHORTNESS OF BREATH  *UNUSUAL BRUISING OR BLEEDING  TENDERNESS IN MOUTH AND THROAT WITH OR WITHOUT PRESENCE OF ULCERS  *URINARY PROBLEMS  *BOWEL PROBLEMS  UNUSUAL RASH Items with * indicate a potential emergency and should be followed up as soon as possible.  Feel free to call the clinic you have any questions or concerns. The clinic phone number is [phone].   I have been informed and understand all the instructions given to me. I know to contact the clinic, my physician, or go to the Emergency Department if any problems should occur. I do not have any questions at this time, but understand that I may call the clinic during office hours   should I have any questions or need assistance in obtaining follow up care.    __________________________________________  _____________  __________ Signature of Patient or Authorized Representative            Date                   Time    __________________________________________ Nurse's Signature

## 2014-10-29 NOTE — Progress Notes (Signed)
  [place] OFFICE PROGRESS NOTE   Diagnosis:  Gastric cancer  INTERVAL HISTORY:   Alicia [name] returns as scheduled. She completed cycle 6 FOLFIRI 10/15/2014. She denies nausea/vomiting associated with the chemotherapy. She consistently becomes nauseated in the flush room. No mouth sores. Diarrhea is much improved since beginning Lomotil. She estimates 1 bowel movement a day. She describes pain as "very little". She takes oxycodone as needed. She reports a good appetite. She is gaining weight.  Objective:  Vital signs in last 24 hours:  Blood pressure 144/79, pulse 75, temperature 98.6 F (37 C), temperature source Oral, resp. rate 18, height 5' (1.524 m), weight 173 lb (78.472 kg).    HEENT: No thrush or ulcers. Resp: Lungs clear bilaterally. Cardio: Regular rate and rhythm. GI: Abdomen is soft and nontender. No hepatomegaly. Firm fullness superior to the umbilicus. Vascular: No leg edema. Calves soft and nontender.  Skin: No rash.    Lab Results:  Lab Results  Component Value Date   WBC 12.8* 10/29/2014   HGB 9.3* 10/29/2014   HCT 29.8* 10/29/2014   MCV 98.0 10/29/2014   PLT 206 10/29/2014   NEUTROABS 9.7* 10/29/2014    Imaging:  No results found.  Medications: I have reviewed the patient's current medications.  Assessment/Plan: 1. Stage IV gastric cancer  Status post bilateral oophorectomy, total abdominal hysterectomy, omentectomy, resection of abdominal wall and pelvic masses, bilateral pelvic lymphadenectomy 10/18/2013 with the pathology confirming moderate to poorly differentiated adenocarcinoma  Stomach biopsy 11/28/2013 revealed adenocarcinoma  Status post 12 cycles of FOLFOX, last given 04/25/2014  Restaging CT 04/29/2014 with thickening of the stomach and nodular densities adjacent to the greater curvature-unchanged, faint groundglass nodularity in the lungs-nonspecific  Restaging CT 07/22/2014 with indeterminate lung nodules;  moderate wall thickening in the body region of the stomach; fairly extensive omental disease and findings suspicious for abdominal wall and incisional disease.  Started systemic chemotherapy with FOLFIRI on 07/30/14.  Restaging CT evaluation 10/14/2014 following 5 cycles of FOLFIRI showed decreased nodularity in both lungs; overall stable omental nodularity; involution of the previously demonstrated ill-defined soft tissue mass involving the anterior abdominal wall incision.  Cycle 6 FOLFIRI 10/15/2014  Cycle 7 FOLFIRI 10/29/2014  2. Oxaliplatin neuropathy  3. Bilateral upper and lower lobe pulmonary emboli within segmental branches-now maintained on Coumadin. Followed by Coumadin clinic per PCP.  4. Neutropenia related to chemotherapy 08/13/2014. Cycle 2 FOLFIRI held. Neulasta added beginning with cycle 2 on 08/20/2014.  5. Nausea following chemotherapy-the anti-emetic regimen was adjusted with cycle 4 FOLFIRI 09/17/2014  6. Diarrhea following FOLFIRI-no improvement with Imodium. Lomotil initiated 10/15/2014. Significant improvement with Lomotil.   Disposition: Alicia [name] appears stable. She has completed 6 cycles of FOLFIRI. Plan to proceed with cycle 7 today as scheduled.  The diarrhea is significantly improved. She will continue Lomotil as needed.  She will return for a follow-up visit and cycle 8 in 2 weeks. She will contact the office in the interim with any problems. She was given a new prescription for oxycodone at today's visit.    Ned Card ANP/GNP-BC   10/29/2014  10:40 AM

## 2014-10-29 NOTE — Telephone Encounter (Signed)
Per staff message and POF I have scheduled appts. Advised scheduler of appts and first available given. JMW  

## 2014-10-29 NOTE — Patient Instructions (Signed)

## 2014-10-30 ENCOUNTER — Other Ambulatory Visit: Payer: Self-pay | Admitting: *Deleted

## 2014-10-31 ENCOUNTER — Ambulatory Visit: Payer: Medicaid Other

## 2014-10-31 ENCOUNTER — Ambulatory Visit (HOSPITAL_BASED_OUTPATIENT_CLINIC_OR_DEPARTMENT_OTHER): Payer: Medicaid Other

## 2014-10-31 DIAGNOSIS — D701 Agranulocytosis secondary to cancer chemotherapy: Secondary | ICD-10-CM

## 2014-10-31 DIAGNOSIS — Z452 Encounter for adjustment and management of vascular access device: Secondary | ICD-10-CM

## 2014-10-31 DIAGNOSIS — C169 Malignant neoplasm of stomach, unspecified: Secondary | ICD-10-CM

## 2014-10-31 MED ORDER — HEPARIN SOD (PORK) LOCK FLUSH 100 UNIT/ML IV SOLN
500.0000 [IU] | Freq: Once | INTRAVENOUS | Status: AC | PRN
  Administered 2014-10-31: 500 [IU]
  Filled 2014-10-31: qty 5

## 2014-10-31 MED ORDER — PEGFILGRASTIM INJECTION 6 MG/0.6ML ~~LOC~~
6.0000 mg | PREFILLED_SYRINGE | Freq: Once | SUBCUTANEOUS | Status: AC
  Administered 2014-10-31: 6 mg via SUBCUTANEOUS
  Filled 2014-10-31: qty 0.6

## 2014-10-31 MED ORDER — SODIUM CHLORIDE 0.9 % IJ SOLN
10.0000 mL | INTRAMUSCULAR | Status: DC | PRN
  Administered 2014-10-31: 10 mL
  Filled 2014-10-31: qty 10

## 2014-10-31 NOTE — Patient Instructions (Signed)
Implanted Port Home Guide An implanted port is a type of central line that is placed under the skin. Central lines are used to provide IV access when treatment or nutrition needs to be given through a person's veins. Implanted ports are used for long-term IV access. An implanted port may be placed because:   You need IV medicine that would be irritating to the small veins in your hands or arms.   You need long-term IV medicines, such as antibiotics.   You need IV nutrition for a long period.   You need frequent blood draws for lab tests.   You need dialysis.  Implanted ports are usually placed in the chest area, but they can also be placed in the upper arm, the abdomen, or the leg. An implanted port has two main parts:   Reservoir. The reservoir is round and will appear as a small, raised area under your skin. The reservoir is the part where a needle is inserted to give medicines or draw blood.   Catheter. The catheter is a thin, flexible tube that extends from the reservoir. The catheter is placed into a large vein. Medicine that is inserted into the reservoir goes into the catheter and then into the vein.  HOW WILL I CARE FOR MY INCISION SITE? Do not get the incision site wet. Bathe or shower as directed by your health care provider.  HOW IS MY PORT ACCESSED? Special steps must be taken to access the port:   Before the port is accessed, a numbing cream can be placed on the skin. This helps numb the skin over the port site.   Your health care provider uses a sterile technique to access the port.  Your health care provider must put on a mask and sterile gloves.  The skin over your port is cleaned carefully with an antiseptic and allowed to dry.  The port is gently pinched between sterile gloves, and a needle is inserted into the port.  Only "non-coring" port needles should be used to access the port. Once the port is accessed, a blood return should be checked. This helps  ensure that the port is in the vein and is not clogged.   If your port needs to remain accessed for a constant infusion, a clear (transparent) bandage will be placed over the needle site. The bandage and needle will need to be changed every week, or as directed by your health care provider.   Keep the bandage covering the needle clean and dry. Do not get it wet. Follow your health care provider's instructions on how to take a shower or bath while the port is accessed.   If your port does not need to stay accessed, no bandage is needed over the port.  WHAT IS FLUSHING? Flushing helps keep the port from getting clogged. Follow your health care provider's instructions on how and when to flush the port. Ports are usually flushed with saline solution or a medicine called heparin. The need for flushing will depend on how the port is used.   If the port is used for intermittent medicines or blood draws, the port will need to be flushed:   After medicines have been given.   After blood has been drawn.   As part of routine maintenance.   If a constant infusion is running, the port may not need to be flushed.  HOW LONG WILL MY PORT STAY IMPLANTED? The port can stay in for as long as your health care   provider thinks it is needed. When it is time for the port to come out, surgery will be done to remove it. The procedure is similar to the one performed when the port was put in.  WHEN SHOULD I SEEK IMMEDIATE MEDICAL CARE? When you have an implanted port, you should seek immediate medical care if:   You notice a bad smell coming from the incision site.   You have swelling, redness, or drainage at the incision site.   You have more swelling or pain at the port site or the surrounding area.   You have a fever that is not controlled with medicine. Document Released: 08/02/2005 Document Revised: 05/23/2013 Document Reviewed: 04/09/2013 [facility] Patient Information 2015 [place], [place]. This  information is not intended to replace advice given to you by your health care provider. Make sure you discuss any questions you have with your health care provider. Pegfilgrastim injection What is this medicine? PEGFILGRASTIM (peg fil GRA stim) is a long-acting granulocyte colony-stimulating factor that stimulates the growth of neutrophils, a type of white blood cell important in the body's fight against infection. It is used to reduce the incidence of fever and infection in patients with certain types of cancer who are receiving chemotherapy that affects the bone marrow. This medicine may be used for other purposes; ask your health care provider or pharmacist if you have questions. COMMON BRAND NAME(S): Neulasta What should I tell my health care provider before I take this medicine? They need to know if you have any of these conditions: -latex allergy -ongoing radiation therapy -sickle cell disease -skin reactions to acrylic adhesives (On-Body Injector only) -an unusual or allergic reaction to pegfilgrastim, filgrastim, other medicines, foods, dyes, or preservatives -pregnant or trying to get pregnant -breast-feeding How should I use this medicine? This medicine is for injection under the skin. If you get this medicine at home, you will be taught how to prepare and give the pre-filled syringe or how to use the On-body Injector. Refer to the patient Instructions for Use for detailed instructions. Use exactly as directed. Take your medicine at regular intervals. Do not take your medicine more often than directed. It is important that you put your used needles and syringes in a special sharps container. Do not put them in a trash can. If you do not have a sharps container, call your pharmacist or healthcare provider to get one. Talk to your pediatrician regarding the use of this medicine in children. Special care may be needed. Overdosage: If you think you have taken too much of this medicine  contact a poison control center or emergency room at once. NOTE: This medicine is only for you. Do not share this medicine with others. What if I miss a dose? It is important not to miss your dose. Call your doctor or health care professional if you miss your dose. If you miss a dose due to an On-body Injector failure or leakage, a new dose should be administered as soon as possible using a single prefilled syringe for manual use. What may interact with this medicine? Interactions have not been studied. Give your health care provider a list of all the medicines, herbs, non-prescription drugs, or dietary supplements you use. Also tell them if you smoke, drink alcohol, or use illegal drugs. Some items may interact with your medicine. This list may not describe all possible interactions. Give your health care provider a list of all the medicines, herbs, non-prescription drugs, or dietary supplements you use. Also tell  them if you smoke, drink alcohol, or use illegal drugs. Some items may interact with your medicine. What should I watch for while using this medicine? You may need blood work done while you are taking this medicine. If you are going to need a MRI, CT scan, or other procedure, tell your doctor that you are using this medicine (On-Body Injector only). What side effects may I notice from receiving this medicine? Side effects that you should report to your doctor or health care professional as soon as possible: -allergic reactions like skin rash, itching or hives, swelling of the face, lips, or tongue -dizziness -fever -pain, redness, or irritation at site where injected -pinpoint red spots on the skin -shortness of breath or breathing problems -stomach or side pain, or pain at the shoulder -swelling -tiredness -trouble passing urine Side effects that usually do not require medical attention (report to your doctor or health care professional if they continue or are bothersome): -bone  pain -muscle pain This list may not describe all possible side effects. Call your doctor for medical advice about side effects. You may report side effects to FDA at 1-800-FDA-1088. Where should I keep my medicine? Keep out of the reach of children. Store pre-filled syringes in a refrigerator between 2 and 8 degrees C (36 and 46 degrees F). Do not freeze. Keep in carton to protect from light. Throw away this medicine if it is left out of the refrigerator for more than 48 hours. Throw away any unused medicine after the expiration date. NOTE: This sheet is a summary. It may not cover all possible information. If you have questions about this medicine, talk to your doctor, pharmacist, or health care provider.  2015, [employer]vier/[employer]. (2013-11-01 16:14:05)

## 2014-11-04 ENCOUNTER — Telehealth: Payer: Self-pay | Admitting: *Deleted

## 2014-11-04 NOTE — Telephone Encounter (Signed)
-----   Message from Owens Shark, NP sent at 11/01/2014  4:23 PM EDT ----- Please find out who is managing her Coumadin. Thanks.

## 2014-11-04 NOTE — Telephone Encounter (Signed)
Per [facility] manages patients Coumadin.  Informed Elby Showers. Marcello Moores, NP.

## 2014-11-07 ENCOUNTER — Telehealth: Payer: Self-pay | Admitting: *Deleted

## 2014-11-07 DIAGNOSIS — Z7901 Long term (current) use of anticoagulants: Secondary | ICD-10-CM

## 2014-11-07 DIAGNOSIS — I2699 Other pulmonary embolism without acute cor pulmonale: Secondary | ICD-10-CM

## 2014-11-07 DIAGNOSIS — C169 Malignant neoplasm of stomach, unspecified: Secondary | ICD-10-CM

## 2014-11-07 NOTE — Telephone Encounter (Signed)
Called pt via [employer] [phone]. Pt reports she is taking Coumadin 8 mg daily. (#2 4mg  tabs) She denies missing any doses. Reports she is currently taking Coumadin, Oxycodone and Potassium. No new meds or vitamins.  Instructed her to continue this dose until office visit 3/29 and to bring medications with her. Informed her [facility] anticoag clinic will monitor her INR while she is undergoing chemotherapy. Pt expressed understanding via interpreter.  Will forward this message to pt's primary care office at [place]. Pt to continue follow up there for primary care needs.

## 2014-11-10 ENCOUNTER — Other Ambulatory Visit: Payer: Self-pay | Admitting: Oncology

## 2014-11-12 ENCOUNTER — Ambulatory Visit (HOSPITAL_BASED_OUTPATIENT_CLINIC_OR_DEPARTMENT_OTHER): Payer: Self-pay | Admitting: Oncology

## 2014-11-12 ENCOUNTER — Other Ambulatory Visit (HOSPITAL_BASED_OUTPATIENT_CLINIC_OR_DEPARTMENT_OTHER): Payer: Self-pay

## 2014-11-12 ENCOUNTER — Ambulatory Visit (HOSPITAL_BASED_OUTPATIENT_CLINIC_OR_DEPARTMENT_OTHER): Payer: Self-pay

## 2014-11-12 ENCOUNTER — Ambulatory Visit: Payer: Self-pay

## 2014-11-12 ENCOUNTER — Telehealth: Payer: Self-pay | Admitting: Oncology

## 2014-11-12 ENCOUNTER — Telehealth: Payer: Self-pay | Admitting: *Deleted

## 2014-11-12 ENCOUNTER — Ambulatory Visit: Payer: Self-pay | Admitting: Pharmacist

## 2014-11-12 ENCOUNTER — Other Ambulatory Visit: Payer: Self-pay | Admitting: *Deleted

## 2014-11-12 VITALS — BP 139/88 | HR 83 | Temp 98.5°F | Resp 18 | Ht 60.0 in | Wt 171.6 lb

## 2014-11-12 DIAGNOSIS — R918 Other nonspecific abnormal finding of lung field: Secondary | ICD-10-CM

## 2014-11-12 DIAGNOSIS — C169 Malignant neoplasm of stomach, unspecified: Secondary | ICD-10-CM

## 2014-11-12 DIAGNOSIS — R110 Nausea: Secondary | ICD-10-CM

## 2014-11-12 DIAGNOSIS — R197 Diarrhea, unspecified: Secondary | ICD-10-CM

## 2014-11-12 DIAGNOSIS — Z5111 Encounter for antineoplastic chemotherapy: Secondary | ICD-10-CM

## 2014-11-12 DIAGNOSIS — I2699 Other pulmonary embolism without acute cor pulmonale: Secondary | ICD-10-CM

## 2014-11-12 DIAGNOSIS — G620 Drug-induced polyneuropathy: Secondary | ICD-10-CM

## 2014-11-12 DIAGNOSIS — R911 Solitary pulmonary nodule: Secondary | ICD-10-CM

## 2014-11-12 DIAGNOSIS — Z95828 Presence of other vascular implants and grafts: Secondary | ICD-10-CM

## 2014-11-12 LAB — CBC WITH DIFFERENTIAL/PLATELET
BASO%: 0.6 % (ref 0.0–2.0)
Basophils Absolute: 0.1 10*3/uL (ref 0.0–0.1)
EOS%: 0.5 % (ref 0.0–7.0)
Eosinophils Absolute: 0.1 10*3/uL (ref 0.0–0.5)
HCT: 28.1 % — ABNORMAL LOW (ref 34.8–46.6)
HEMOGLOBIN: 8.7 g/dL — AB (ref 11.6–15.9)
LYMPH#: 1.4 10*3/uL (ref 0.9–3.3)
LYMPH%: 13.5 % — ABNORMAL LOW (ref 14.0–49.7)
MCH: 30.4 pg (ref 25.1–34.0)
MCHC: 31 g/dL — AB (ref 31.5–36.0)
MCV: 98.3 fL (ref 79.5–101.0)
MONO#: 1.2 10*3/uL — ABNORMAL HIGH (ref 0.1–0.9)
MONO%: 12.2 % (ref 0.0–14.0)
NEUT#: 7.4 10*3/uL — ABNORMAL HIGH (ref 1.5–6.5)
NEUT%: 73.2 % (ref 38.4–76.8)
NRBC: 1 % — AB (ref 0–0)
Platelets: 206 10*3/uL (ref 145–400)
RBC: 2.86 10*6/uL — ABNORMAL LOW (ref 3.70–5.45)
RDW: 20.5 % — ABNORMAL HIGH (ref 11.2–14.5)
WBC: 10.1 10*3/uL (ref 3.9–10.3)

## 2014-11-12 LAB — COMPREHENSIVE METABOLIC PANEL (CC13)
ALBUMIN: 3.5 g/dL (ref 3.5–5.0)
ALT: 11 U/L (ref 0–55)
AST: 13 U/L (ref 5–34)
Alkaline Phosphatase: 145 U/L (ref 40–150)
Anion Gap: 11 mEq/L (ref 3–11)
BUN: 9.3 mg/dL (ref 7.0–26.0)
CO2: 24 mEq/L (ref 22–29)
Calcium: 8.8 mg/dL (ref 8.4–10.4)
Chloride: 107 mEq/L (ref 98–109)
Creatinine: 0.6 mg/dL (ref 0.6–1.1)
GLUCOSE: 98 mg/dL (ref 70–140)
Potassium: 3.6 mEq/L (ref 3.5–5.1)
Sodium: 142 mEq/L (ref 136–145)
Total Bilirubin: 0.55 mg/dL (ref 0.20–1.20)
Total Protein: 6.1 g/dL — ABNORMAL LOW (ref 6.4–8.3)

## 2014-11-12 LAB — PROTIME-INR
INR: 1.4 — ABNORMAL LOW (ref 2.00–3.50)
Protime: 16.8 Seconds — ABNORMAL HIGH (ref 10.6–13.4)

## 2014-11-12 LAB — POCT INR: INR: 1.4

## 2014-11-12 MED ORDER — SODIUM CHLORIDE 0.9 % IJ SOLN
10.0000 mL | INTRAMUSCULAR | Status: DC | PRN
  Filled 2014-11-12: qty 10

## 2014-11-12 MED ORDER — SODIUM CHLORIDE 0.9 % IJ SOLN
10.0000 mL | INTRAMUSCULAR | Status: DC | PRN
  Administered 2014-11-12: 10 mL via INTRAVENOUS
  Filled 2014-11-12: qty 10

## 2014-11-12 MED ORDER — HEPARIN SOD (PORK) LOCK FLUSH 100 UNIT/ML IV SOLN
500.0000 [IU] | Freq: Once | INTRAVENOUS | Status: DC | PRN
  Filled 2014-11-12: qty 5

## 2014-11-12 MED ORDER — SODIUM CHLORIDE 0.9 % IV SOLN
Freq: Once | INTRAVENOUS | Status: AC
  Administered 2014-11-12: 10:00:00 via INTRAVENOUS
  Filled 2014-11-12: qty 5

## 2014-11-12 MED ORDER — ATROPINE SULFATE 1 MG/ML IJ SOLN
INTRAMUSCULAR | Status: AC
  Filled 2014-11-12: qty 1

## 2014-11-12 MED ORDER — OXYCODONE HCL 5 MG PO TABS: ORAL_TABLET | ORAL | Status: DC

## 2014-11-12 MED ORDER — ATROPINE SULFATE 1 MG/ML IJ SOLN
0.5000 mg | Freq: Once | INTRAMUSCULAR | Status: AC | PRN
  Administered 2014-11-12: 0.5 mg via INTRAVENOUS

## 2014-11-12 MED ORDER — LEUCOVORIN CALCIUM INJECTION 350 MG
400.0000 mg/m2 | Freq: Once | INTRAMUSCULAR | Status: AC
  Administered 2014-11-12: 768 mg via INTRAVENOUS
  Filled 2014-11-12: qty 38.4

## 2014-11-12 MED ORDER — IRINOTECAN HCL CHEMO INJECTION 100 MG/5ML
180.0000 mg/m2 | Freq: Once | INTRAVENOUS | Status: AC
  Administered 2014-11-12: 346 mg via INTRAVENOUS
  Filled 2014-11-12: qty 17.3

## 2014-11-12 MED ORDER — SODIUM CHLORIDE 0.9 % IV SOLN
2400.0000 mg/m2 | INTRAVENOUS | Status: DC
  Administered 2014-11-12: 4600 mg via INTRAVENOUS
  Filled 2014-11-12: qty 92

## 2014-11-12 MED ORDER — PALONOSETRON HCL INJECTION 0.25 MG/5ML
INTRAVENOUS | Status: AC
  Filled 2014-11-12: qty 5

## 2014-11-12 MED ORDER — PALONOSETRON HCL INJECTION 0.25 MG/5ML
0.2500 mg | Freq: Once | INTRAVENOUS | Status: AC
  Administered 2014-11-12: 0.25 mg via INTRAVENOUS

## 2014-11-12 MED ORDER — FLUOROURACIL CHEMO INJECTION 2.5 GM/50ML
400.0000 mg/m2 | Freq: Once | INTRAVENOUS | Status: AC
  Administered 2014-11-12: 750 mg via INTRAVENOUS
  Filled 2014-11-12: qty 15

## 2014-11-12 MED ORDER — SODIUM CHLORIDE 0.9 % IV SOLN
Freq: Once | INTRAVENOUS | Status: AC
  Administered 2014-11-12: 10:00:00 via INTRAVENOUS

## 2014-11-12 NOTE — Patient Instructions (Signed)
[place] Discharge Instructions for Patients Receiving Chemotherapy  Today you received the following chemotherapy agents Irinotecan, leucovorin and 5FU.  To help prevent nausea and vomiting after your treatment, we encourage you to take your nausea medication as prescribed.   If you develop nausea and vomiting that is not controlled by your nausea medication, call the clinic.   BELOW ARE SYMPTOMS THAT SHOULD BE REPORTED IMMEDIATELY:  *FEVER GREATER THAN 100.5 F  *CHILLS WITH OR WITHOUT FEVER  NAUSEA AND VOMITING THAT IS NOT CONTROLLED WITH YOUR NAUSEA MEDICATION  *UNUSUAL SHORTNESS OF BREATH  *UNUSUAL BRUISING OR BLEEDING  TENDERNESS IN MOUTH AND THROAT WITH OR WITHOUT PRESENCE OF ULCERS  *URINARY PROBLEMS  *BOWEL PROBLEMS  UNUSUAL RASH Items with * indicate a potential emergency and should be followed up as soon as possible.  Feel free to call the clinic you have any questions or concerns. The clinic phone number is [phone].  Please show the [place] at check-in to the Emergency Department and triage nurse.

## 2014-11-12 NOTE — Progress Notes (Signed)
Pt was seen during her infusion today. She is new to our clinic but not new to Coumadin. She was previously managed through her PCP INR=1.4 with goal 2-3 She states she was taking 8mg  Mon and Tue and 4mg  other days. This is different than the last dose mentioned in our flowsheet (12mg  Mon/Wed with 8mg  other days)  I have instructed her to take 8mg  Tue, Thur, Sat and Sun and 4mg  on Mon, Wed and Fri. I adjusted based on what she stated she was taking Her new weekly dose would be 44mg  (vs 36mg  she states she was taking)  She will be back for infusion on 11/26/14 and we will see her during her infusion.

## 2014-11-12 NOTE — Patient Instructions (Signed)

## 2014-11-12 NOTE — Patient Instructions (Signed)
Patient was instructed to take 8mg  of coumadin on Tue, Thur, Sat and Sun.  She will take 4mg  on Mon, Wed and Fri. We reviewed these instructions with the interpreter. She had no questions and repeated the new dose back to me.

## 2014-11-12 NOTE — Telephone Encounter (Signed)
Per staff message and POF I have scheduled appts. Advised scheduler of appts. JMW  

## 2014-11-12 NOTE — Telephone Encounter (Signed)
Pt confirmed labs/ov per 03/29 POF, gave pt AVS and Calendar.... KJ, sent msg to add chemo   °

## 2014-11-12 NOTE — Progress Notes (Signed)
  [place] OFFICE PROGRESS NOTE   Diagnosis: Gastric cancer    INTERVAL HISTORY:   She returns as scheduled. She completed another cycle of FOLFIRI on 10/29/2014. Minimal nausea and diarrhea following chemotherapy. She has a mild headache following chemotherapy.  Objective:  Vital signs in last 24 hours:  Blood pressure 139/88, pulse 83, temperature 98.5 F (36.9 C), temperature source Oral, resp. rate 18, height 5' (1.524 m), weight 171 lb 9.6 oz (77.837 kg), SpO2 100 %.    HEENT: No thrush or ulcers Resp: Lungs clear bilaterally Cardio: Regular rate and rhythm GI: No hepatomegaly, no mass, nontender Vascular: No leg edema  Skin: Hyperpigmentation of the hands   Portacath/PICC-without erythema  Lab Results:  Lab Results  Component Value Date   WBC 10.1 11/12/2014   HGB 8.7* 11/12/2014   HCT 28.1* 11/12/2014   MCV 98.3 11/12/2014   PLT 206 11/12/2014   NEUTROABS 7.4* 11/12/2014   potassium 3.6  PT/INR 1.4   Imaging:  No results found.  Medications: I have reviewed the patient's current medications.  Assessment/Plan: 1. Stage IV gastric cancer  Status post bilateral oophorectomy, total abdominal hysterectomy, omentectomy, resection of abdominal wall and pelvic masses, bilateral pelvic lymphadenectomy 10/18/2013 with the pathology confirming moderate to poorly differentiated adenocarcinoma  Stomach biopsy 11/28/2013 revealed adenocarcinoma  Status post 12 cycles of FOLFOX, last given 04/25/2014  Restaging CT 04/29/2014 with thickening of the stomach and nodular densities adjacent to the greater curvature-unchanged, faint groundglass nodularity in the lungs-nonspecific  Restaging CT 07/22/2014 with indeterminate lung nodules; moderate wall thickening in the body region of the stomach; fairly extensive omental disease and findings suspicious for abdominal wall and incisional disease.  Started systemic chemotherapy with FOLFIRI on  07/30/14.  Restaging CT evaluation 10/14/2014 following 5 cycles of FOLFIRI showed decreased nodularity in both lungs; overall stable omental nodularity; involution of the previously demonstrated ill-defined soft tissue mass involving the anterior abdominal wall incision.  Cycle 6 FOLFIRI 10/15/2014  Cycle 7 FOLFIRI 10/29/2014  Cycle 8 FOLFIRI 11/12/2014  2. Oxaliplatin neuropathy  3. Bilateral upper and lower lobe pulmonary emboli within segmental branches-now maintained on Coumadin. Followed by Coumadin clinic per PCP.  4. Neutropenia related to chemotherapy 08/13/2014. Cycle 2 FOLFIRI held. Neulasta added beginning with cycle 2 on 08/20/2014.  5. Nausea following chemotherapy-the anti-emetic regimen was adjusted with cycle 4 FOLFIRI 09/17/2014  6. Diarrhea following FOLFIRI-no improvement with Imodium. Lomotil initiated 10/15/2014. Significant improvement with Lomotil.  Disposition:  She appears stable. The plan is to proceed with another cycle of FOLFIRI today. She will complete 3 more cycles of FOLFIRI prior to a restaging CT.  Betsy Coder, MD  11/12/2014  9:33 AM

## 2014-11-14 ENCOUNTER — Ambulatory Visit (HOSPITAL_BASED_OUTPATIENT_CLINIC_OR_DEPARTMENT_OTHER): Payer: Self-pay

## 2014-11-14 ENCOUNTER — Ambulatory Visit: Payer: Medicaid Other

## 2014-11-14 DIAGNOSIS — D701 Agranulocytosis secondary to cancer chemotherapy: Secondary | ICD-10-CM

## 2014-11-14 DIAGNOSIS — C169 Malignant neoplasm of stomach, unspecified: Secondary | ICD-10-CM

## 2014-11-14 MED ORDER — PEGFILGRASTIM INJECTION 6 MG/0.6ML ~~LOC~~
6.0000 mg | PREFILLED_SYRINGE | Freq: Once | SUBCUTANEOUS | Status: AC
  Administered 2014-11-14: 6 mg via SUBCUTANEOUS
  Filled 2014-11-14: qty 0.6

## 2014-11-14 MED ORDER — SODIUM CHLORIDE 0.9 % IJ SOLN
10.0000 mL | INTRAMUSCULAR | Status: DC | PRN
  Administered 2014-11-14: 10 mL
  Filled 2014-11-14: qty 10

## 2014-11-14 MED ORDER — HEPARIN SOD (PORK) LOCK FLUSH 100 UNIT/ML IV SOLN
500.0000 [IU] | Freq: Once | INTRAVENOUS | Status: AC | PRN
  Administered 2014-11-14: 500 [IU]
  Filled 2014-11-14: qty 5

## 2014-11-14 NOTE — Patient Instructions (Signed)
Pegfilgrastim injection What is this medicine? PEGFILGRASTIM (peg fil GRA stim) is a long-acting granulocyte colony-stimulating factor that stimulates the growth of neutrophils, a type of white blood cell important in the body's fight against infection. It is used to reduce the incidence of fever and infection in patients with certain types of cancer who are receiving chemotherapy that affects the bone marrow. This medicine may be used for other purposes; ask your health care provider or pharmacist if you have questions. COMMON BRAND NAME(S): Neulasta What should I tell my health care provider before I take this medicine? They need to know if you have any of these conditions: -latex allergy -ongoing radiation therapy -sickle cell disease -skin reactions to acrylic adhesives (On-Body Injector only) -an unusual or allergic reaction to pegfilgrastim, filgrastim, other medicines, foods, dyes, or preservatives -pregnant or trying to get pregnant -breast-feeding How should I use this medicine? This medicine is for injection under the skin. If you get this medicine at home, you will be taught how to prepare and give the pre-filled syringe or how to use the On-body Injector. Refer to the patient Instructions for Use for detailed instructions. Use exactly as directed. Take your medicine at regular intervals. Do not take your medicine more often than directed. It is important that you put your used needles and syringes in a special sharps container. Do not put them in a trash can. If you do not have a sharps container, call your pharmacist or healthcare provider to get one. Talk to your pediatrician regarding the use of this medicine in children. Special care may be needed. Overdosage: If you think you have taken too much of this medicine contact a poison control center or emergency room at once. NOTE: This medicine is only for you. Do not share this medicine with others. What if I miss a dose? It is  important not to miss your dose. Call your doctor or health care professional if you miss your dose. If you miss a dose due to an On-body Injector failure or leakage, a new dose should be administered as soon as possible using a single prefilled syringe for manual use. What may interact with this medicine? Interactions have not been studied. Give your health care provider a list of all the medicines, herbs, non-prescription drugs, or dietary supplements you use. Also tell them if you smoke, drink alcohol, or use illegal drugs. Some items may interact with your medicine. This list may not describe all possible interactions. Give your health care provider a list of all the medicines, herbs, non-prescription drugs, or dietary supplements you use. Also tell them if you smoke, drink alcohol, or use illegal drugs. Some items may interact with your medicine. What should I watch for while using this medicine? You may need blood work done while you are taking this medicine. If you are going to need a MRI, CT scan, or other procedure, tell your doctor that you are using this medicine (On-Body Injector only). What side effects may I notice from receiving this medicine? Side effects that you should report to your doctor or health care professional as soon as possible: -allergic reactions like skin rash, itching or hives, swelling of the face, lips, or tongue -dizziness -fever -pain, redness, or irritation at site where injected -pinpoint red spots on the skin -shortness of breath or breathing problems -stomach or side pain, or pain at the shoulder -swelling -tiredness -trouble passing urine Side effects that usually do not require medical attention (report to your doctor   or health care professional if they continue or are bothersome): -bone pain -muscle pain This list may not describe all possible side effects. Call your doctor for medical advice about side effects. You may report side effects to FDA at  1-800-FDA-1088. Where should I keep my medicine? Keep out of the reach of children. Store pre-filled syringes in a refrigerator between 2 and 8 degrees C (36 and 46 degrees F). Do not freeze. Keep in carton to protect from light. Throw away this medicine if it is left out of the refrigerator for more than 48 hours. Throw away any unused medicine after the expiration date. NOTE: This sheet is a summary. It may not cover all possible information. If you have questions about this medicine, talk to your doctor, pharmacist, or health care provider.  2015, [employer]. (2013-11-01 16:14:05)  

## 2014-11-14 NOTE — Progress Notes (Signed)
Patient received Neulasta injection in Flush Room.

## 2014-11-19 ENCOUNTER — Other Ambulatory Visit: Payer: Self-pay | Admitting: *Deleted

## 2014-11-19 ENCOUNTER — Ambulatory Visit: Payer: Self-pay | Admitting: Nurse Practitioner

## 2014-11-19 ENCOUNTER — Other Ambulatory Visit: Payer: Self-pay

## 2014-11-19 ENCOUNTER — Encounter: Payer: Self-pay | Admitting: Oncology

## 2014-11-19 MED ORDER — MAGIC MOUTHWASH: 5.0000 mL | Freq: Four times a day (QID) | ORAL | Status: DC | PRN

## 2014-11-21 ENCOUNTER — Ambulatory Visit: Payer: Self-pay

## 2014-11-24 ENCOUNTER — Other Ambulatory Visit: Payer: Self-pay | Admitting: Oncology

## 2014-11-26 ENCOUNTER — Telehealth: Payer: Self-pay | Admitting: Oncology

## 2014-11-26 ENCOUNTER — Ambulatory Visit (HOSPITAL_BASED_OUTPATIENT_CLINIC_OR_DEPARTMENT_OTHER): Payer: Self-pay

## 2014-11-26 ENCOUNTER — Ambulatory Visit (HOSPITAL_BASED_OUTPATIENT_CLINIC_OR_DEPARTMENT_OTHER): Payer: Self-pay | Admitting: Pharmacist

## 2014-11-26 ENCOUNTER — Ambulatory Visit (HOSPITAL_BASED_OUTPATIENT_CLINIC_OR_DEPARTMENT_OTHER): Payer: Medicaid Other | Admitting: Nurse Practitioner

## 2014-11-26 ENCOUNTER — Telehealth: Payer: Self-pay

## 2014-11-26 ENCOUNTER — Other Ambulatory Visit (HOSPITAL_BASED_OUTPATIENT_CLINIC_OR_DEPARTMENT_OTHER): Payer: Self-pay

## 2014-11-26 VITALS — BP 137/79 | HR 80 | Temp 98.2°F | Resp 16 | Wt 170.3 lb

## 2014-11-26 DIAGNOSIS — Z5111 Encounter for antineoplastic chemotherapy: Secondary | ICD-10-CM

## 2014-11-26 DIAGNOSIS — G620 Drug-induced polyneuropathy: Secondary | ICD-10-CM

## 2014-11-26 DIAGNOSIS — R918 Other nonspecific abnormal finding of lung field: Secondary | ICD-10-CM

## 2014-11-26 DIAGNOSIS — C169 Malignant neoplasm of stomach, unspecified: Secondary | ICD-10-CM

## 2014-11-26 DIAGNOSIS — I2699 Other pulmonary embolism without acute cor pulmonale: Secondary | ICD-10-CM

## 2014-11-26 DIAGNOSIS — K1231 Oral mucositis (ulcerative) due to antineoplastic therapy: Secondary | ICD-10-CM

## 2014-11-26 DIAGNOSIS — Z7901 Long term (current) use of anticoagulants: Secondary | ICD-10-CM

## 2014-11-26 DIAGNOSIS — Z452 Encounter for adjustment and management of vascular access device: Secondary | ICD-10-CM

## 2014-11-26 DIAGNOSIS — Z95828 Presence of other vascular implants and grafts: Secondary | ICD-10-CM

## 2014-11-26 LAB — CBC WITH DIFFERENTIAL/PLATELET
BASO%: 0.2 % (ref 0.0–2.0)
Basophils Absolute: 0 10*3/uL (ref 0.0–0.1)
EOS%: 0.7 % (ref 0.0–7.0)
Eosinophils Absolute: 0.1 10*3/uL (ref 0.0–0.5)
HCT: 29.1 % — ABNORMAL LOW (ref 34.8–46.6)
HGB: 9 g/dL — ABNORMAL LOW (ref 11.6–15.9)
LYMPH#: 1.6 10*3/uL (ref 0.9–3.3)
LYMPH%: 18.7 % (ref 14.0–49.7)
MCH: 30.3 pg (ref 25.1–34.0)
MCHC: 30.9 g/dL — ABNORMAL LOW (ref 31.5–36.0)
MCV: 98 fL (ref 79.5–101.0)
MONO#: 0.9 10*3/uL (ref 0.1–0.9)
MONO%: 10.4 % (ref 0.0–14.0)
NEUT#: 5.9 10*3/uL (ref 1.5–6.5)
NEUT%: 70 % (ref 38.4–76.8)
Platelets: 239 10*3/uL (ref 145–400)
RBC: 2.97 10*6/uL — AB (ref 3.70–5.45)
RDW: 19.1 % — AB (ref 11.2–14.5)
WBC: 8.5 10*3/uL (ref 3.9–10.3)

## 2014-11-26 LAB — PROTIME-INR
INR: 3.5 (ref 2.00–3.50)
PROTIME: 42 s — AB (ref 10.6–13.4)

## 2014-11-26 LAB — COMPREHENSIVE METABOLIC PANEL (CC13)
ALK PHOS: 144 U/L (ref 40–150)
ALT: <6 U/L (ref 0–55)
AST: 12 U/L (ref 5–34)
Albumin: 3.3 g/dL — ABNORMAL LOW (ref 3.5–5.0)
Anion Gap: 7 mEq/L (ref 3–11)
BUN: 7.4 mg/dL (ref 7.0–26.0)
CHLORIDE: 107 meq/L (ref 98–109)
CO2: 28 mEq/L (ref 22–29)
CREATININE: 0.6 mg/dL (ref 0.6–1.1)
Calcium: 8.6 mg/dL (ref 8.4–10.4)
EGFR: >90 mL/min/{1.73_m2} (ref >90)
GLUCOSE: 89 mg/dL (ref 70–140)
Potassium: 3.3 mEq/L — ABNORMAL LOW (ref 3.5–5.1)
Sodium: 142 mEq/L (ref 136–145)
TOTAL PROTEIN: 5.6 g/dL — AB (ref 6.4–8.3)
Total Bilirubin: 0.37 mg/dL (ref 0.20–1.20)

## 2014-11-26 LAB — POCT INR: INR: 3.5

## 2014-11-26 MED ORDER — IRINOTECAN HCL CHEMO INJECTION 100 MG/5ML
180.0000 mg/m2 | Freq: Once | INTRAVENOUS | Status: AC
  Administered 2014-11-26: 346 mg via INTRAVENOUS
  Filled 2014-11-26: qty 17.3

## 2014-11-26 MED ORDER — ATROPINE SULFATE 1 MG/ML IJ SOLN
0.5000 mg | Freq: Once | INTRAMUSCULAR | Status: AC | PRN
  Administered 2014-11-26: 0.5 mg via INTRAVENOUS

## 2014-11-26 MED ORDER — FOSAPREPITANT DIMEGLUMINE INJECTION 150 MG
Freq: Once | INTRAVENOUS | Status: AC
  Administered 2014-11-26: 12:00:00 via INTRAVENOUS
  Filled 2014-11-26: qty 5

## 2014-11-26 MED ORDER — OXYCODONE HCL 5 MG PO TABS: ORAL_TABLET | ORAL | Status: DC

## 2014-11-26 MED ORDER — PALONOSETRON HCL INJECTION 0.25 MG/5ML
0.2500 mg | Freq: Once | INTRAVENOUS | Status: AC
  Administered 2014-11-26: 0.25 mg via INTRAVENOUS

## 2014-11-26 MED ORDER — SODIUM CHLORIDE 0.9 % IV SOLN
Freq: Once | INTRAVENOUS | Status: AC
  Administered 2014-11-26: 12:00:00 via INTRAVENOUS

## 2014-11-26 MED ORDER — SODIUM CHLORIDE 0.9 % IV SOLN
1600.0000 mg/m2 | INTRAVENOUS | Status: DC
  Administered 2014-11-26: 3050 mg via INTRAVENOUS
  Filled 2014-11-26: qty 61

## 2014-11-26 MED ORDER — FLUOROURACIL CHEMO INJECTION 2.5 GM/50ML
300.0000 mg/m2 | Freq: Once | INTRAVENOUS | Status: AC
  Administered 2014-11-26: 600 mg via INTRAVENOUS
  Filled 2014-11-26: qty 12

## 2014-11-26 MED ORDER — ATROPINE SULFATE 1 MG/ML IJ SOLN
INTRAMUSCULAR | Status: AC
Start: 2014-11-26 — End: 2014-11-26
  Filled 2014-11-26: qty 1

## 2014-11-26 MED ORDER — PALONOSETRON HCL INJECTION 0.25 MG/5ML
INTRAVENOUS | Status: AC
  Filled 2014-11-26: qty 5

## 2014-11-26 MED ORDER — LEUCOVORIN CALCIUM INJECTION 350 MG
300.0000 mg/m2 | Freq: Once | INTRAVENOUS | Status: AC
  Administered 2014-11-26: 576 mg via INTRAVENOUS
  Filled 2014-11-26: qty 28.8

## 2014-11-26 MED ORDER — WARFARIN SODIUM 4 MG PO TABS: ORAL_TABLET | ORAL | Status: DC

## 2014-11-26 MED ORDER — SODIUM CHLORIDE 0.9 % IJ SOLN
10.0000 mL | INTRAMUSCULAR | Status: DC | PRN
  Administered 2014-11-26: 10 mL via INTRAVENOUS
  Filled 2014-11-26: qty 10

## 2014-11-26 NOTE — Progress Notes (Signed)
INR slightly above goal. Hg/Hct:  9/29.1, Pltc = 239 Pt followed dosing instructions provided at last visit per Dose Response (8mg  daily except 4mg  on MWF). No missed or extra coumadin doses. No changes in diet or medications. No unusual bruising. No bleeding noted. No s/s of clotting noted. No changes to report. Slightly decrease coumadin to 4mg  daily except 8mg  on MWF. Recheck INR with scheduled appts on 12/10/14; lab at 8:45am, Flush at 9am, Dr. Benay Spice at 9:30am, treatment at 10:30am and coumadin clinic at 10:45am. Pt seen in the infusion area - no charge encounter.

## 2014-11-26 NOTE — Telephone Encounter (Signed)
-----   Message from Owens Shark, NP sent at 11/26/2014  3:46 PM EDT ----- Please find out if she is still taking potassium and if so what is the current dose.

## 2014-11-26 NOTE — Telephone Encounter (Signed)
Called pt to see if she was taking her potassium, pt phone number not working. Called interpretor  Almyra Free, who states her phone has been shut off. Pt comes in 4/14 for pump d/c, will discuss medication with her when she comes in.

## 2014-11-26 NOTE — Patient Instructions (Signed)
Slightly decrease coumadin to 4mg  daily except 8mg  on MWF. Recheck INR with scheduled appts on 12/10/14; lab at 8:45am, Flush at 9am, Dr. Benay Spice at 9:30am, treatment at 10:30am and coumadin clinic at 10:45am.

## 2014-11-26 NOTE — Patient Instructions (Signed)

## 2014-11-26 NOTE — Telephone Encounter (Signed)
gave and printed appt sched adn avs for pt for April and May....Marland Kitchensed added tx.

## 2014-11-26 NOTE — Patient Instructions (Signed)
[facility] Discharge Instructions for Patients Receiving Chemotherapy  Today you received the following chemotherapy agents leucovorin/irinotecan/fluorouracil  To help prevent nausea and vomiting after your treatment, we encourage you to take your nausea medication as directed   If you develop nausea and vomiting that is not controlled by your nausea medication, call the clinic.   BELOW ARE SYMPTOMS THAT SHOULD BE REPORTED IMMEDIATELY:  *FEVER GREATER THAN 100.5 F  *CHILLS WITH OR WITHOUT FEVER  NAUSEA AND VOMITING THAT IS NOT CONTROLLED WITH YOUR NAUSEA MEDICATION  *UNUSUAL SHORTNESS OF BREATH  *UNUSUAL BRUISING OR BLEEDING  TENDERNESS IN MOUTH AND THROAT WITH OR WITHOUT PRESENCE OF ULCERS  *URINARY PROBLEMS  *BOWEL PROBLEMS  UNUSUAL RASH Items with * indicate a potential emergency and should be followed up as soon as possible.  Feel free to call the clinic you have any questions or concerns. The clinic phone number is [phone]. 

## 2014-11-26 NOTE — Progress Notes (Signed)
  [place] OFFICE PROGRESS NOTE   Diagnosis:  Gastric cancer  INTERVAL HISTORY:   She returns as scheduled. She completed cycle 8 FOLFIRI on 11/12/2014. She had minimal nausea. She developed sores along the tongue about 1 week after the chemotherapy. She was unable to eat for 3 days. She used magic mouthwash. The sores have resolved. She had minimal diarrhea which was controlled with Lomotil. Abdominal pain rated as a 0 as long she takes oxycodone.  Objective:  Vital signs in last 24 hours:  Blood pressure 137/79, pulse 80, temperature 98.2 F (36.8 C), temperature source Oral, resp. rate 16, weight 170 lb 4.8 oz (77.248 kg), SpO2 100 %.    HEENT: No thrush or ulcers. Resp: Lungs clear bilaterally. Cardio: Regular rate and rhythm. GI: Abdomen soft and nontender. No hepatomegaly. Firm fullness superior to the umbilicus. Vascular: No leg edema.  Port-A-Cath without erythema.    Lab Results:  Lab Results  Component Value Date   WBC 8.5 11/26/2014   HGB 9.0* 11/26/2014   HCT 29.1* 11/26/2014   MCV 98.0 11/26/2014   PLT 239 11/26/2014   NEUTROABS 5.9 11/26/2014    Imaging:  No results found.  Medications: I have reviewed the patient's current medications.  Assessment/Plan: 1. Stage IV gastric cancer  Status post bilateral oophorectomy, total abdominal hysterectomy, omentectomy, resection of abdominal wall and pelvic masses, bilateral pelvic lymphadenectomy 10/18/2013 with the pathology confirming moderate to poorly differentiated adenocarcinoma  Stomach biopsy 11/28/2013 revealed adenocarcinoma  Status post 12 cycles of FOLFOX, last given 04/25/2014  Restaging CT 04/29/2014 with thickening of the stomach and nodular densities adjacent to the greater curvature-unchanged, faint groundglass nodularity in the lungs-nonspecific  Restaging CT 07/22/2014 with indeterminate lung nodules; moderate wall thickening in the body region of the stomach; fairly  extensive omental disease and findings suspicious for abdominal wall and incisional disease.  Started systemic chemotherapy with FOLFIRI on 07/30/14.  Restaging CT evaluation 10/14/2014 following 5 cycles of FOLFIRI showed decreased nodularity in both lungs; overall stable omental nodularity; involution of the previously demonstrated ill-defined soft tissue mass involving the anterior abdominal wall incision.  Cycle 6 FOLFIRI 10/15/2014  Cycle 7 FOLFIRI 10/29/2014  Cycle 8 FOLFIRI 11/12/2014  Cycle 9 FOLFIRI 11/26/2014 (dose reduction of 5-fluorouracil due to mucositis)  2. Oxaliplatin neuropathy  3. Bilateral upper and lower lobe pulmonary emboli within segmental branches-now maintained on Coumadin. Follow[facility] at [facility].  4. Neutropenia related to chemotherapy 08/13/2014. Cycle 2 FOLFIRI held. Neulasta added beginning with cycle 2 on 08/20/2014.  5. Nausea following chemotherapy-the anti-emetic regimen was adjusted with cycle 4 FOLFIRI 09/17/2014  6. Diarrhea following FOLFIRI-no improvement with Imodium. Lomotil initiated 10/15/2014. Significant improvement with Lomotil.  7. Mouth sores following cycle 8 FOLFIRI. 5 fluorouracil dose reduced.   Disposition: Ms. Morais appears stable. She has completed 8 cycles of FOLFIRI. Plan to proceed with cycle 9 today as scheduled.   She developed mucositis following cycle 8. This affected her ability to eat for 3 days. The 5-fluorouracil bolus and pump will be dose reduced beginning with cycle 9.  She will return for a follow-up visit and cycle 10 in 2 weeks. Restaging CT evaluation planned after cycle 10.  Plan reviewed with Dr. Benay Spice.    Ned Card ANP/GNP-BC   11/26/2014  11:22 AM

## 2014-11-28 ENCOUNTER — Ambulatory Visit (HOSPITAL_BASED_OUTPATIENT_CLINIC_OR_DEPARTMENT_OTHER): Payer: Self-pay

## 2014-11-28 ENCOUNTER — Ambulatory Visit: Payer: Self-pay

## 2014-11-28 ENCOUNTER — Telehealth: Payer: Self-pay

## 2014-11-28 VITALS — BP 125/77 | HR 91 | Temp 98.7°F

## 2014-11-28 DIAGNOSIS — C169 Malignant neoplasm of stomach, unspecified: Secondary | ICD-10-CM

## 2014-11-28 DIAGNOSIS — Z452 Encounter for adjustment and management of vascular access device: Secondary | ICD-10-CM

## 2014-11-28 DIAGNOSIS — Z5189 Encounter for other specified aftercare: Secondary | ICD-10-CM

## 2014-11-28 MED ORDER — HEPARIN SOD (PORK) LOCK FLUSH 100 UNIT/ML IV SOLN
500.0000 [IU] | Freq: Once | INTRAVENOUS | Status: AC | PRN
  Administered 2014-11-28: 500 [IU]
  Filled 2014-11-28: qty 5

## 2014-11-28 MED ORDER — PEGFILGRASTIM INJECTION 6 MG/0.6ML ~~LOC~~
6.0000 mg | PREFILLED_SYRINGE | Freq: Once | SUBCUTANEOUS | Status: AC
  Administered 2014-11-28: 6 mg via SUBCUTANEOUS
  Filled 2014-11-28: qty 0.6

## 2014-11-28 MED ORDER — SODIUM CHLORIDE 0.9 % IJ SOLN
10.0000 mL | INTRAMUSCULAR | Status: DC | PRN
  Administered 2014-11-28: 10 mL
  Filled 2014-11-28: qty 10

## 2014-11-28 NOTE — Progress Notes (Signed)
Neulasta injection given by flush nurse 

## 2014-11-28 NOTE — Telephone Encounter (Signed)
-----   Message from Owens Shark, NP sent at 11/26/2014  3:46 PM EDT ----- Please find out if she is still taking potassium and if so what is the current dose.

## 2014-11-28 NOTE — Telephone Encounter (Signed)
Pt in for pump d/c today, daughter Debroah Baller to interpret. Pt states she is taking potassium 1 pill once daily. Informed them pt potassium was slightly low and per Ned Card, Np to take 1 pill twice daily for 3 days then back to once daily. Pt and daughter verbalized understanding. Also requested a letter that Dr. Benay Spice was going to write for patients god-mother. Letter retrieved from pts chart and given to pt.

## 2014-11-28 NOTE — Patient Instructions (Signed)
Pegfilgrastim injection What is this medicine? PEGFILGRASTIM (peg fil GRA stim) is a long-acting granulocyte colony-stimulating factor that stimulates the growth of neutrophils, a type of white blood cell important in the body's fight against infection. It is used to reduce the incidence of fever and infection in patients with certain types of cancer who are receiving chemotherapy that affects the bone marrow. This medicine may be used for other purposes; ask your health care provider or pharmacist if you have questions. COMMON BRAND NAME(S): Neulasta What should I tell my health care provider before I take this medicine? They need to know if you have any of these conditions: -latex allergy -ongoing radiation therapy -sickle cell disease -skin reactions to acrylic adhesives (On-Body Injector only) -an unusual or allergic reaction to pegfilgrastim, filgrastim, other medicines, foods, dyes, or preservatives -pregnant or trying to get pregnant -breast-feeding How should I use this medicine? This medicine is for injection under the skin. If you get this medicine at home, you will be taught how to prepare and give the pre-filled syringe or how to use the On-body Injector. Refer to the patient Instructions for Use for detailed instructions. Use exactly as directed. Take your medicine at regular intervals. Do not take your medicine more often than directed. It is important that you put your used needles and syringes in a special sharps container. Do not put them in a trash can. If you do not have a sharps container, call your pharmacist or healthcare provider to get one. Talk to your pediatrician regarding the use of this medicine in children. Special care may be needed. Overdosage: If you think you have taken too much of this medicine contact a poison control center or emergency room at once. NOTE: This medicine is only for you. Do not share this medicine with others. What if I miss a dose? It is  important not to miss your dose. Call your doctor or health care professional if you miss your dose. If you miss a dose due to an On-body Injector failure or leakage, a new dose should be administered as soon as possible using a single prefilled syringe for manual use. What may interact with this medicine? Interactions have not been studied. Give your health care provider a list of all the medicines, herbs, non-prescription drugs, or dietary supplements you use. Also tell them if you smoke, drink alcohol, or use illegal drugs. Some items may interact with your medicine. This list may not describe all possible interactions. Give your health care provider a list of all the medicines, herbs, non-prescription drugs, or dietary supplements you use. Also tell them if you smoke, drink alcohol, or use illegal drugs. Some items may interact with your medicine. What should I watch for while using this medicine? You may need blood work done while you are taking this medicine. If you are going to need a MRI, CT scan, or other procedure, tell your doctor that you are using this medicine (On-Body Injector only). What side effects may I notice from receiving this medicine? Side effects that you should report to your doctor or health care professional as soon as possible: -allergic reactions like skin rash, itching or hives, swelling of the face, lips, or tongue -dizziness -fever -pain, redness, or irritation at site where injected -pinpoint red spots on the skin -shortness of breath or breathing problems -stomach or side pain, or pain at the shoulder -swelling -tiredness -trouble passing urine Side effects that usually do not require medical attention (report to your doctor   or health care professional if they continue or are bothersome): -bone pain -muscle pain This list may not describe all possible side effects. Call your doctor for medical advice about side effects. You may report side effects to FDA at  1-800-FDA-1088. Where should I keep my medicine? Keep out of the reach of children. Store pre-filled syringes in a refrigerator between 2 and 8 degrees C (36 and 46 degrees F). Do not freeze. Keep in carton to protect from light. Throw away this medicine if it is left out of the refrigerator for more than 48 hours. Throw away any unused medicine after the expiration date. NOTE: This sheet is a summary. It may not cover all possible information. If you have questions about this medicine, talk to your doctor, pharmacist, or health care provider.  2015, Elsevier/Gold Standard. (2013-11-01 16:14:05) Fluorouracil, 5FU; Diclofenac topical cream What is this medicine? FLUOROURACIL; DICLOFENAC (flure oh YOOR a sil; dye KLOE fen ak) is a combination of a topical chemotherapy agent and non-steroidal anti-inflammatory drug (NSAID). It is used on the skin to treat skin cancer and skin conditions that could become cancer. This medicine may be used for other purposes; ask your health care provider or pharmacist if you have questions. COMMON BRAND NAME(S): FLUORAC What should I tell my health care provider before I take this medicine? They need to know if you have any of these conditions: -bleeding problems -cigarette smoker -DPD enzyme deficiency -heart disease -high blood pressure -if you frequently drink alcohol containing drinks -kidney disease -liver disease -open or infected skin -stomach problems -swelling or open sores at the treatment site -recent or planned coronary artery bypass graft (CABG) surgery -an unusual or allergic reaction to fluorouracil, diclofenac, aspirin, other NSAIDs, other medicines, foods, dyes, or preservatives -pregnant or trying to get pregnant -breast-feeding How should I use this medicine? This medicine is only for use on the skin. Follow the directions on the prescription label. Wash hands before and after use. Wash affected area and gently pat dry. To apply this  medicine use a cotton-tipped applicator, or use gloves if applying with fingertips. If applied with unprotected fingertips, it is very important to wash your hands well after you apply this medicine. Avoid applying to the eyes, nose, or mouth. Apply enough medicine to cover the affected area. You can cover the area with a light gauze dressing, but do not use tight or air-tight dressings. Finish the full course prescribed by your doctor or health care professional, even if you think your condition is better. Do not stop taking except on the advice of your doctor or health care professional. Talk to your pediatrician regarding the use of this medicine in children. Special care may be needed. Overdosage: If you think you've taken too much of this medicine contact a poison control center or emergency room at once. Overdosage: If you think you have taken too much of this medicine contact a poison control center or emergency room at once. NOTE: This medicine is only for you. Do not share this medicine with others. What if I miss a dose? If you miss a dose, apply it as soon as you can. If it is almost time for your next dose, only use that dose. Do not apply extra doses. Contact your doctor or health care professional if you miss more than one dose. What may interact with this medicine? Interactions are not expected. Do not use any other skin products without telling your doctor or health care professional. This list   may not describe all possible interactions. Give your health care provider a list of all the medicines, herbs, non-prescription drugs, or dietary supplements you use. Also tell them if you smoke, drink alcohol, or use illegal drugs. Some items may interact with your medicine. What should I watch for while using this medicine? Visit your doctor or health care professional for checks on your progress. You will need to use this medicine for 2 to 6 weeks. This may be longer depending on the condition  being treated. You may not see full healing for another 1 to 2 months after you stop using the medicine. Treated areas of skin can look unsightly during and for several weeks after treatment with this medicine. This medicine can make you more sensitive to the sun. Keep out of the sun. If you cannot avoid being in the sun, wear protective clothing and use sunscreen. Do not use sun lamps or tanning beds/booths. Where should I keep my What side effects may I notice from receiving this medicine? Side effects that you should report to your doctor or health care professional as soon as possible: -allergic reactions like skin rash, itching or hives, swelling of the face, lips, or tongue -black or bloody stools, blood in the urine or vomit -blurred vision -chest pain -difficulty breathing or wheezing -redness, blistering, peeling or loosening of the skin, including inside the mouth -severe redness and swelling of normal skin -slurred speech or weakness on one side of the body -trouble passing urine or change in the amount of urine -unexplained weight gain or swelling -unusually weak or tired -yellowing of eyes or skin Side effects that usually do not require medical attention (Report these to your doctor or health care professional if they continue or are bothersome.): -increased sensitivity of the skin to sun and ultraviolet light -pain and burning of the affected area -scaling or swelling of the affected area -skin rash, itching of the affected area -tenderness This list may not describe all possible side effects. Call your doctor for medical advice about side effects. You may report side effects to FDA at 1-800-FDA-1088. Where should I keep my medicine? Keep out of the reach of children. Store at room temperature between 20 and 25 degrees C (68 and 77 degrees F). Throw away any unused medicine after the expiration date. NOTE: This sheet is a summary. It may not cover all possible information.  If you have questions about this medicine, talk to your doctor, pharmacist, or health care provider.  2015, Elsevier/Gold Standard. (2013-12-03 11:09:58)  

## 2014-12-05 ENCOUNTER — Inpatient Hospital Stay (HOSPITAL_COMMUNITY): Payer: Medicaid Other

## 2014-12-05 ENCOUNTER — Encounter (HOSPITAL_COMMUNITY): Payer: Self-pay | Admitting: Emergency Medicine

## 2014-12-05 ENCOUNTER — Inpatient Hospital Stay (HOSPITAL_COMMUNITY)
Admission: EM | Admit: 2014-12-05 | Discharge: 2014-12-07 | DRG: 392 | Disposition: A | Discharge status: Home / Self Care | Payer: Medicaid Other | Attending: Internal Medicine | Admitting: Internal Medicine

## 2014-12-05 ENCOUNTER — Emergency Department (HOSPITAL_COMMUNITY): Payer: Medicaid Other

## 2014-12-05 DIAGNOSIS — R918 Other nonspecific abnormal finding of lung field: Secondary | ICD-10-CM

## 2014-12-05 DIAGNOSIS — C800 Disseminated malignant neoplasm, unspecified: Secondary | ICD-10-CM | POA: Diagnosis present

## 2014-12-05 DIAGNOSIS — Z8249 Family history of ischemic heart disease and other diseases of the circulatory system: Secondary | ICD-10-CM | POA: Diagnosis not present

## 2014-12-05 DIAGNOSIS — T451X5A Adverse effect of antineoplastic and immunosuppressive drugs, initial encounter: Secondary | ICD-10-CM | POA: Diagnosis present

## 2014-12-05 DIAGNOSIS — Z90710 Acquired absence of both cervix and uterus: Secondary | ICD-10-CM | POA: Diagnosis not present

## 2014-12-05 DIAGNOSIS — Z79891 Long term (current) use of opiate analgesic: Secondary | ICD-10-CM | POA: Diagnosis not present

## 2014-12-05 DIAGNOSIS — C799 Secondary malignant neoplasm of unspecified site: Secondary | ICD-10-CM

## 2014-12-05 DIAGNOSIS — E876 Hypokalemia: Secondary | ICD-10-CM | POA: Diagnosis present

## 2014-12-05 DIAGNOSIS — R109 Unspecified abdominal pain: Secondary | ICD-10-CM | POA: Diagnosis present

## 2014-12-05 DIAGNOSIS — R112 Nausea with vomiting, unspecified: Secondary | ICD-10-CM | POA: Diagnosis not present

## 2014-12-05 DIAGNOSIS — R519 Headache, unspecified: Secondary | ICD-10-CM

## 2014-12-05 DIAGNOSIS — R197 Diarrhea, unspecified: Secondary | ICD-10-CM | POA: Diagnosis present

## 2014-12-05 DIAGNOSIS — Z7901 Long term (current) use of anticoagulants: Secondary | ICD-10-CM

## 2014-12-05 DIAGNOSIS — D6481 Anemia due to antineoplastic chemotherapy: Secondary | ICD-10-CM | POA: Diagnosis present

## 2014-12-05 DIAGNOSIS — D638 Anemia in other chronic diseases classified elsewhere: Secondary | ICD-10-CM | POA: Diagnosis present

## 2014-12-05 DIAGNOSIS — Z79899 Other long term (current) drug therapy: Secondary | ICD-10-CM | POA: Diagnosis not present

## 2014-12-05 DIAGNOSIS — R51 Headache: Secondary | ICD-10-CM | POA: Diagnosis present

## 2014-12-05 DIAGNOSIS — Z5181 Encounter for therapeutic drug level monitoring: Secondary | ICD-10-CM

## 2014-12-05 DIAGNOSIS — F329 Major depressive disorder, single episode, unspecified: Secondary | ICD-10-CM | POA: Diagnosis present

## 2014-12-05 DIAGNOSIS — Z833 Family history of diabetes mellitus: Secondary | ICD-10-CM

## 2014-12-05 DIAGNOSIS — Z888 Allergy status to other drugs, medicaments and biological substances status: Secondary | ICD-10-CM | POA: Diagnosis not present

## 2014-12-05 DIAGNOSIS — C169 Malignant neoplasm of stomach, unspecified: Secondary | ICD-10-CM | POA: Diagnosis present

## 2014-12-05 DIAGNOSIS — I2699 Other pulmonary embolism without acute cor pulmonale: Secondary | ICD-10-CM | POA: Diagnosis present

## 2014-12-05 DIAGNOSIS — G620 Drug-induced polyneuropathy: Secondary | ICD-10-CM | POA: Diagnosis present

## 2014-12-05 DIAGNOSIS — Z86711 Personal history of pulmonary embolism: Secondary | ICD-10-CM

## 2014-12-05 LAB — CBC WITH DIFFERENTIAL/PLATELET
Basophils Absolute: 0 10*3/uL (ref 0.0–0.1)
Basophils Relative: 0 % (ref 0–1)
EOS ABS: 0 10*3/uL (ref 0.0–0.7)
EOS PCT: 1 % (ref 0–5)
HCT: 28.1 % — ABNORMAL LOW (ref 36.0–46.0)
HEMOGLOBIN: 9 g/dL — AB (ref 12.0–15.0)
LYMPHS ABS: 1.3 10*3/uL (ref 0.7–4.0)
Lymphocytes Relative: 14 % (ref 12–46)
MCH: 30.8 pg (ref 26.0–34.0)
MCHC: 32 g/dL (ref 30.0–36.0)
MCV: 96.2 fL (ref 78.0–100.0)
Monocytes Absolute: 0.9 10*3/uL (ref 0.1–1.0)
Monocytes Relative: 10 % (ref 3–12)
Neutro Abs: 6.6 10*3/uL (ref 1.7–7.7)
Neutrophils Relative %: 75 % (ref 43–77)
Platelets: 201 10*3/uL (ref 150–400)
RBC: 2.92 MIL/uL — ABNORMAL LOW (ref 3.87–5.11)
RDW: 18.3 % — ABNORMAL HIGH (ref 11.5–15.5)
WBC: 8.8 10*3/uL (ref 4.0–10.5)

## 2014-12-05 LAB — URINE MICROSCOPIC-ADD ON

## 2014-12-05 LAB — LIPASE, BLOOD: Lipase: 15 U/L (ref 11–59)

## 2014-12-05 LAB — URINALYSIS, ROUTINE W REFLEX MICROSCOPIC
Bilirubin Urine: NEGATIVE
Glucose, UA: NEGATIVE mg/dL
Ketones, ur: NEGATIVE mg/dL
Nitrite: NEGATIVE
PH: 5 (ref 5.0–8.0)
Protein, ur: NEGATIVE mg/dL
SPECIFIC GRAVITY, URINE: 1.022 (ref 1.005–1.030)
Urobilinogen, UA: 0.2 mg/dL (ref 0.0–1.0)

## 2014-12-05 LAB — COMPREHENSIVE METABOLIC PANEL
ALBUMIN: 3.8 g/dL (ref 3.5–5.2)
ALT: 9 U/L (ref 0–35)
AST: 15 U/L (ref 0–37)
Alkaline Phosphatase: 167 U/L — ABNORMAL HIGH (ref 39–117)
Anion gap: 10 (ref 5–15)
BUN: 8 mg/dL (ref 6–23)
CALCIUM: 8.6 mg/dL (ref 8.4–10.5)
CO2: 24 mmol/L (ref 19–32)
Chloride: 101 mmol/L (ref 96–112)
Creatinine, Ser: 0.56 mg/dL (ref 0.50–1.10)
GFR calc Af Amer: >90 mL/min (ref >90)
Glucose, Bld: 105 mg/dL — ABNORMAL HIGH (ref 70–99)
Potassium: 3.7 mmol/L (ref 3.5–5.1)
SODIUM: 135 mmol/L (ref 135–145)
Total Bilirubin: 0.3 mg/dL (ref 0.3–1.2)
Total Protein: 6.5 g/dL (ref 6.0–8.3)

## 2014-12-05 LAB — PROTIME-INR
INR: 4.23 — ABNORMAL HIGH (ref 0.00–1.49)
Prothrombin Time: 41 seconds — ABNORMAL HIGH (ref 11.6–15.2)

## 2014-12-05 LAB — I-STAT CG4 LACTIC ACID, ED: LACTIC ACID, VENOUS: 1.69 mmol/L (ref 0.5–2.0)

## 2014-12-05 MED ORDER — HYDROMORPHONE HCL 1 MG/ML IJ SOLN
1.0000 mg | Freq: Once | INTRAMUSCULAR | Status: AC
  Administered 2014-12-05: 1 mg via INTRAVENOUS
  Filled 2014-12-05: qty 1

## 2014-12-05 MED ORDER — SODIUM CHLORIDE 0.9 % IV BOLUS (SEPSIS)
1000.0000 mL | Freq: Once | INTRAVENOUS | Status: AC
  Administered 2014-12-05: 1000 mL via INTRAVENOUS

## 2014-12-05 MED ORDER — SODIUM CHLORIDE 0.9 % IV SOLN
INTRAVENOUS | Status: DC
  Administered 2014-12-05: 100 mL/h via INTRAVENOUS
  Administered 2014-12-06 (×3): via INTRAVENOUS

## 2014-12-05 MED ORDER — MORPHINE SULFATE 2 MG/ML IJ SOLN
2.0000 mg | INTRAMUSCULAR | Status: DC | PRN
  Administered 2014-12-05 – 2014-12-06 (×2): 2 mg via INTRAVENOUS
  Filled 2014-12-05 (×2): qty 1

## 2014-12-05 MED ORDER — ONDANSETRON HCL 4 MG PO TABS: 4.0000 mg | ORAL_TABLET | Freq: Four times a day (QID) | ORAL | Status: DC | PRN

## 2014-12-05 MED ORDER — WARFARIN - PHARMACIST DOSING INPATIENT: Freq: Every day | Status: DC

## 2014-12-05 MED ORDER — ACETAMINOPHEN 325 MG PO TABS: 650.0000 mg | ORAL_TABLET | Freq: Four times a day (QID) | ORAL | Status: DC | PRN

## 2014-12-05 MED ORDER — ONDANSETRON HCL 4 MG/2ML IJ SOLN
4.0000 mg | Freq: Once | INTRAMUSCULAR | Status: AC
  Administered 2014-12-05: 4 mg via INTRAVENOUS
  Filled 2014-12-05: qty 2

## 2014-12-05 MED ORDER — ONDANSETRON HCL 4 MG/2ML IJ SOLN
4.0000 mg | Freq: Four times a day (QID) | INTRAMUSCULAR | Status: DC | PRN
  Administered 2014-12-05: 4 mg via INTRAVENOUS
  Filled 2014-12-05: qty 2

## 2014-12-05 MED ORDER — IOHEXOL 300 MG/ML  SOLN
50.0000 mL | Freq: Once | INTRAMUSCULAR | Status: AC | PRN
  Administered 2014-12-05: 50 mL via ORAL

## 2014-12-05 MED ORDER — SODIUM CHLORIDE 0.9 % IJ SOLN
INTRAMUSCULAR | Status: AC
  Administered 2014-12-05: 10 mL
  Filled 2014-12-05: qty 10

## 2014-12-05 MED ORDER — ALBUTEROL SULFATE (2.5 MG/3ML) 0.083% IN NEBU: 2.5000 mg | INHALATION_SOLUTION | RESPIRATORY_TRACT | Status: DC | PRN

## 2014-12-05 MED ORDER — IOHEXOL 300 MG/ML  SOLN
100.0000 mL | Freq: Once | INTRAMUSCULAR | Status: AC | PRN
  Administered 2014-12-05: 100 mL via INTRAVENOUS

## 2014-12-05 MED ORDER — ACETAMINOPHEN 650 MG RE SUPP: 650.0000 mg | Freq: Four times a day (QID) | RECTAL | Status: DC | PRN

## 2014-12-05 MED ORDER — OXYCODONE HCL 5 MG PO TABS
5.0000 mg | ORAL_TABLET | ORAL | Status: DC | PRN
  Administered 2014-12-06: 5 mg via ORAL
  Filled 2014-12-05 (×2): qty 1

## 2014-12-05 NOTE — ED Notes (Signed)
Pt states that she has stomach cancer.  States last chemo was 4/12. States that she has been having mid abd pain, NVD since Monday.  No fever.  Pt speaks spanish.

## 2014-12-05 NOTE — Progress Notes (Signed)
ANTICOAGULATION CONSULT NOTE - Initial Consult  Pharmacy Consult for Warfarin Indication: h/o PE  Allergies  Allergen Reactions  . Xarelto [Rivaroxaban] Anaphylaxis, Nausea And Vomiting and Palpitations    Headache and fatigue     Patient Measurements: Weight: 172 lb 1.6 oz (78.064 kg)   Vital Signs: Temp: 98.4 F (36.9 C) (04/21 1617) Temp Source: Oral (04/21 1617) BP: 126/68 mmHg (04/21 1617) Pulse Rate: 84 (04/21 1617)  Labs:  Recent Labs  12/05/14 1204  HGB 9.0*  HCT 28.1*  PLT 201  LABPROT 41.0*  INR 4.23*  CREATININE 0.56    Estimated Creatinine Clearance: 73.4 mL/min (by C-G formula based on Cr of 0.56).   Medical History: Past Medical History  Diagnosis Date  . 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 sodium chloride     PRN: acetaminophen **OR** acetaminophen, albuterol, morphine injection, ondansetron **OR** ondansetron (ZOFRAN) IV, oxyCODONE  Assessment: 54yo female with h/o gastric cancer s/p chemo on 4/12 and admitted due to nausea, vomitting, diarrhea and abdominal pain. Pt was on Warfarin 4mg  alternating with 8 mg PTA for h/o PE with last dose 4/20. INR today=4.23 Goal of Therapy:  INR 2-3 Monitor platelets by anticoagulation protocol: Yes   Plan:  No Coumadin tonight as INR is supratherapeutic. Will check daily PT/INR  Garnet Sierras, PharmD. 12/05/2014,5:36 PM

## 2014-12-05 NOTE — H&P (Signed)
[facility] History and Physical  Aylah Yeary FFM:[id number] DOB: 1961-02-25 DOA: 12/05/2014   PCP: Minerva Ends, MD  Specialists: Dr. Benay Spice is her oncologist  Chief Complaint: Nausea, vomiting, diarrhea and abdominal pain  HPI: Dezirae Service is a 54 y.o. female with a past medical history of gastric cancer and pulmonary embolism on warfarin, who last received chemotherapy on April 12. She received Neulasta on April 14. She's completed 9 cycles. Patient doesn't speak much [place]. Family member was present who could interpret. Information was also obtained from the ED provider notes. Patient complains of nausea, vomiting and diarrhea ongoing for the last 4 days. She's had loose watery stool. Denies any blood in the stool or in the emesis. Denies any fever or chills. She's had abdominal pain which is present diffusely all over the abdomen. It's 8-9 out of 10 in intensity. Aching pain without any radiation. She also mentions a headache in the top of her head. Not able to quantify. Denies any visual changes. No weakness on any one side of her body. Denies any recent changes to her medications.  Home Medications: Prior to Admission medications   Medication Sig Start Date End Date Taking? Authorizing Provider  Alum & Mag Hydroxide-Simeth (MAGIC MOUTHWASH) SOLN Take 5 mLs by mouth 4 (four) times daily as needed for mouth pain. 11/19/14  Yes Ladell Pier, MD  diphenoxylate-atropine (LOMOTIL) 2.5-0.025 MG per tablet Take 2 tablets by mouth 4 (four) times daily as needed for diarrhea or loose stools. 10/15/14  Yes Owens Shark, NP  lidocaine-prilocaine (EMLA) cream Apply 1 application topically as needed. 10/29/14  Yes Owens Shark, NP  ondansetron (ZOFRAN) 4 MG tablet Take 1 tablet (4 mg total) by mouth every 6 (six) hours. 07/10/14  Yes Dorie Rank, MD  oxyCODONE (ROXICODONE) 5 MG immediate release tablet Take 1 -2 tabs PO Q 4-6 hours PRN pain. 11/26/14  Yes Owens Shark, NP    potassium chloride SA (K-DUR,KLOR-CON) 20 MEQ tablet Take 1 tab twice daily for 3 days then take 1 tab daily Patient taking differently: Take 20 mEq by mouth daily.  10/15/14  Yes Owens Shark, NP  PRESCRIPTION MEDICATION Chemo   Yes Historical Provider, MD  prochlorperazine (COMPAZINE) 10 MG tablet Take 1 tablet (10 mg total) by mouth every 6 (six) hours as needed for nausea or vomiting. 09/03/14  Yes Susanne Borders, NP  warfarin (COUMADIN) 4 MG tablet Take as directed by coumadin clinic Patient taking differently: Take 4-8 mg by mouth daily. Alternates taking 465m and 410meach day. Took 65m70med 12/04/14. 11/26/14  Yes LisOwens SharkP  sertraline (ZOLOFT) 100 MG tablet Take 1 tablet (100 mg total) by mouth daily. Patient not taking: Reported on 12/05/2014 05/17/14   JosBoykin NearingD    Allergies:  Allergies  Allergen Reactions  . Xarelto [Rivaroxaban] Anaphylaxis, Nausea And Vomiting and Palpitations    Headache and fatigue     Past Medical History: Past Medical History  Diagnosis Date  . 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 Abdominal hysterectomy  10/18/2013     Social History: [place]. No history of smoking, alcohol use or illicit drug use. Able to ambulate.  Family History:  Family History  Problem Relation Age of Onset  . Heart disease Mother   . Diabetes Sister   . Heart disease Sister   .  Heart disease Brother      Review of Systems - History obtained from the patient General ROS: positive for  - fatigue Psychological ROS: negative Ophthalmic ROS: negative ENT ROS: negative Allergy and Immunology ROS: negative Hematological and Lymphatic ROS: negative Endocrine ROS: negative Respiratory ROS: no cough, shortness of breath, or wheezing Cardiovascular ROS: no chest pain or dyspnea on exertion Gastrointestinal ROS: as in hpi Genito-Urinary ROS: no dysuria, trouble  voiding, or hematuria Musculoskeletal ROS: negative Neurological ROS: no TIA or stroke symptoms Dermatological ROS: negative  Physical Examination  Filed Vitals:   12/05/14 1109 12/05/14 1411 12/05/14 1529  BP: 130/87 112/71 119/68  Pulse: 108 85 86  Temp: 98 F (36.7 C)  98.5 F (36.9 C)  TempSrc: Oral  Oral  Resp: 16 18 18   SpO2: 98% 97% 99%    BP 119/68 mmHg  Pulse 86  Temp(Src) 98.5 F (36.9 C) (Oral)  Resp 18  SpO2 99%  General appearance: alert, cooperative, appears stated age and no distress Head: Normocephalic, without obvious abnormality, atraumatic Eyes: conjunctivae/corneas clear. PERRL, EOM's intact.  Throat: lips, mucosa, and tongue normal; teeth and gums normal Neck: no adenopathy, no carotid bruit, no JVD, supple, symmetrical, trachea midline and thyroid not enlarged, symmetric, no tenderness/mass/nodules Resp: clear to auscultation bilaterally Cardio: regular rate and rhythm, S1, S2 normal, no murmur, click, rub or gallop GI: Soft. Diffusely tender. No rebound, rigidity or guarding. No specific masses or organomegaly. Bowel sounds present. Extremities: extremities normal, atraumatic, no cyanosis or edema Pulses: 2+ and symmetric Skin: Skin color, texture, turgor normal. No rashes or lesions Lymph nodes: Cervical, supraclavicular, and axillary nodes normal. Neurologic: No focal deficits. A and O x3. No cranial nerve deficits  2-12. Equal motor strength bilaterally.  Laboratory Data: Results for orders placed or performed during the hospital encounter of 12/05/14 (from the past 48 hour(s))  Urinalysis, Routine w reflex microscopic     Status: Abnormal   Collection Time: 12/05/14 11:45 AM  Result Value Ref Range   Color, Urine AMBER (A) YELLOW    Comment: BIOCHEMICALS MAY BE AFFECTED BY COLOR   APPearance CLEAR CLEAR   Specific Gravity, Urine 1.022 1.005 - 1.030   pH 5.0 5.0 - 8.0   Glucose, UA NEGATIVE NEGATIVE mg/dL   Hgb urine dipstick TRACE (A)  NEGATIVE   Bilirubin Urine NEGATIVE NEGATIVE   Ketones, ur NEGATIVE NEGATIVE mg/dL   Protein, ur NEGATIVE NEGATIVE mg/dL   Urobilinogen, UA 0.2 0.0 - 1.0 mg/dL   Nitrite NEGATIVE NEGATIVE   Leukocytes, UA SMALL (A) NEGATIVE  Urine microscopic-add on     Status: None   Collection Time: 12/05/14 11:45 AM  Result Value Ref Range   Squamous Epithelial / LPF RARE RARE   WBC, UA 3-6 <3 WBC/hpf   Bacteria, UA RARE RARE  Comprehensive metabolic panel     Status: Abnormal   Collection Time: 12/05/14 12:04 PM  Result Value Ref Range   Sodium 135 135 - 145 mmol/L   Potassium 3.7 3.5 - 5.1 mmol/L   Chloride 101 96 - 112 mmol/L   CO2 24 19 - 32 mmol/L   Glucose, Bld 105 (H) 70 - 99 mg/dL   BUN 8 6 - 23 mg/dL   Creatinine, Ser 0.56 0.50 - 1.10 mg/dL   Calcium 8.6 8.4 - 10.5 mg/dL   Total Protein 6.5 6.0 - 8.3 g/dL   Albumin 3.8 3.5 - 5.2 g/dL   AST 15 0 - 37 U/L   ALT 9  0 - 35 U/L   Alkaline Phosphatase 167 (H) 39 - 117 U/L   Total Bilirubin 0.3 0.3 - 1.2 mg/dL   GFR calc non Af Amer >90 >90 mL/min   GFR calc Af Amer >90 >90 mL/min    Comment: (NOTE) The eGFR has been calculated using the CKD EPI equation. This calculation has not been validated in all clinical situations. eGFR's persistently <90 mL/min signify possible Chronic Kidney Disease.    Anion gap 10 5 - 15  Lipase, blood     Status: None   Collection Time: 12/05/14 12:04 PM  Result Value Ref Range   Lipase 15 11 - 59 U/L  CBC with Differential     Status: Abnormal   Collection Time: 12/05/14 12:04 PM  Result Value Ref Range   WBC 8.8 4.0 - 10.5 K/uL   RBC 2.92 (L) 3.87 - 5.11 MIL/uL   Hemoglobin 9.0 (L) 12.0 - 15.0 g/dL   HCT 28.1 (L) 36.0 - 46.0 %   MCV 96.2 78.0 - 100.0 fL   MCH 30.8 26.0 - 34.0 pg   MCHC 32.0 30.0 - 36.0 g/dL   RDW 18.3 (H) 11.5 - 15.5 %   Platelets 201 150 - 400 K/uL   Neutrophils Relative % 75 43 - 77 %   Neutro Abs 6.6 1.7 - 7.7 K/uL   Lymphocytes Relative 14 12 - 46 %   Lymphs Abs 1.3 0.7  - 4.0 K/uL   Monocytes Relative 10 3 - 12 %   Monocytes Absolute 0.9 0.1 - 1.0 K/uL   Eosinophils Relative 1 0 - 5 %   Eosinophils Absolute 0.0 0.0 - 0.7 K/uL   Basophils Relative 0 0 - 1 %   Basophils Absolute 0.0 0.0 - 0.1 K/uL  Protime-INR     Status: Abnormal   Collection Time: 12/05/14 12:04 PM  Result Value Ref Range   Prothrombin Time 41.0 (H) 11.6 - 15.2 seconds   INR 4.23 (H) 0.00 - 1.49  I-Stat CG4 Lactic Acid, ED     Status: None   Collection Time: 12/05/14 12:12 PM  Result Value Ref Range   Lactic Acid, Venous 1.69 0.5 - 2.0 mmol/L    Radiology Reports: Ct Abdomen Pelvis W Contrast  12/05/2014   CLINICAL DATA:  LEFT lower quadrant and mid abdominal pain, nausea, vomiting, diarrhea, past history of small bowel adenocarcinoma with ongoing chemotherapy  EXAM: CT ABDOMEN AND PELVIS WITH CONTRAST  TECHNIQUE: Multidetector CT imaging of the abdomen and pelvis was performed using the standard protocol following bolus administration of intravenous contrast. Sagittal and coronal MPR images reconstructed from axial data set.  CONTRAST:  131m OMNIPAQUE IOHEXOL 300 MG/ML SOLN IV. Dilute oral contrast.  COMPARISON:  10/14/2014  FINDINGS: Lung bases clear.  Liver, gallbladder, spleen, pancreas, kidneys, and adrenal glands normal.  Wall thickening of the mid stomach likely representing tumor.  Peritoneal based tumor nodules are identified within the perigastric fat and in the LEFT upper quadrant as well as in the midline in the pelvis.  Pelvic lesion measures 18 x 15 mm image 60 previously 11 x 9 mm  Thickening of the surgical scar the anterior abdominal wall is again identified, inferior aspects slightly more nodular and more prominent than on the previous exam.  Probable wall thickening at the RIGHT lateral aspect of the rectum unchanged.  Fluid and gas within mildly distended colon.  Single dilated small bowel loop in the mid abdomen at site of anastomosis.  No free intraperitoneal  fluid or air.   Normal size retroperitoneal nodes without definite abdominal or pelvic adenopathy.  No hernia identified.  Sclerotic focus RIGHT iliac wing 12 mm diameter not significantly changed.  No new osseous abnormalities.  IMPRESSION: Omental/peritoneal disease with multiple tumor nodules particularly in the perigastric region and omentum, some of which appear increased in size.  Persistent wall thickening of the mid stomach and of the RIGHT lateral aspect of the rectum of suspicious for tumor.  Question tumor at the surgical scar at the ventral abdominal wall, minimally more prominent.  Overall findings suggest mild progression of disease.   Electronically Signed   By: Lavonia Dana M.D.   On: 12/05/2014 14:04     Problem List  Principal Problem:   Nausea with vomiting Active Problems:   PE (pulmonary embolism)   Anticoagulated on Coumadin   Gastric cancer   Abdominal pain   Acute diarrhea   Assessment: This is a 54 year old female of Hispanic origin with a history of stage IV gastric cancer on chemotherapy, presents with a four-day history of nausea, vomiting, abdominal pain, along with diarrhea. She appears to be clinically dehydrated. CT scan of the abdomen pelvis does not show any acute abnormalities, but does suggest progression of her cancer.  Plan: #1 Nausea, vomiting, with diarrhea and abdominal pain: Most likely related to chemotherapy and her gastric cancer. We will check stool for C. difficile. Treat her pain. Hydrate her. Symptomatic treatment for nausea and vomiting. Clear liquid diet for now. UA is noted to be mildly abnormal. But patient is asymptomatic. Not conclusive for UTI.   #2 Headache: Likely related to acute illness. Proceed with CT head.  #3 Stage IV gastric cancer on chemotherapy: She completed 9 cycles of chemotherapy on April 12. She also received Neulasta on April 14. Her oncologist will be flagged on Epic. CT scan does suggest progression of disease.  #4 history of  pulmonary embolism on anticoagulation with warfarin: INR is supratherapeutic. Pharmacy to dose Coumadin.  #5. Normocytic anemia: Hemoglobin is at baseline.   DVT Prophylaxis: On warfarin Code Status: Full code Family Communication: Discussed with patient and family  Disposition Plan: Admit to MedSurg   Further management decisions will depend on results of further testing and patient's response to treatment.   [facility]  [facility] Pager [phone]  If 7PM-7AM, please contact night-coverage [email or web address] Password [facility]  12/05/2014, 3:49 PM

## 2014-12-05 NOTE — Progress Notes (Signed)
Patient or family do not speak [place], only [place].  Need interpreter

## 2014-12-05 NOTE — ED Provider Notes (Signed)
CSN: [id number]     Arrival date & time 12/05/14  1106 History   First MD Initiated Contact with Patient 12/05/14 1118     Chief Complaint  Patient presents with  . Stomach Cancer   . Abdominal Pain  . Nausea  . Emesis     (Consider location/radiation/quality/duration/timing/severity/associated sxs/prior Treatment) Patient is a 54 y.o. female presenting with abdominal pain and vomiting. The history is provided by the patient.  Abdominal Pain Associated symptoms: diarrhea, fatigue, nausea and vomiting   Associated symptoms: no chest pain and no shortness of breath   Emesis Associated symptoms: abdominal pain and diarrhea    patient has stage IV gastric cancer. On chemotherapy with last chemotherapy 7 days ago. For the last few day she has had nausea vomiting diarrhea and abdominal pain. She does have some chronic abdominal pain but is usually controlled by her pain medicines. States this is not controlled. No fevers. She states she has not eaten in 3 days. No blood in the emesis or diarrhea.  Past Medical History  Diagnosis Date  . 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 Abdominal hysterectomy  10/18/2013    Family History  Problem Relation Age of Onset  . Heart disease Mother   . Diabetes Sister   . Heart disease Sister   . Heart disease Brother    History  Substance Use Topics  . Smoking status: Never Smoker   . Smokeless tobacco: Never Used  . Alcohol Use: No   OB History    No data available     Review of Systems  Constitutional: Positive for appetite change and fatigue.  Respiratory: Negative for shortness of breath.   Cardiovascular: Negative for chest pain.  Gastrointestinal: Positive for nausea, vomiting, abdominal pain and diarrhea.  Genitourinary: Negative for flank pain.  Musculoskeletal: Negative for back pain.      Allergies  Xarelto  Home Medications   Prior to Admission  medications   Medication Sig Start Date End Date Taking? Authorizing Provider  Alum & Mag Hydroxide-Simeth (MAGIC MOUTHWASH) SOLN Take 5 mLs by mouth 4 (four) times daily as needed for mouth pain. 11/19/14  Yes Ladell Pier, MD  diphenoxylate-atropine (LOMOTIL) 2.5-0.025 MG per tablet Take 2 tablets by mouth 4 (four) times daily as needed for diarrhea or loose stools. 10/15/14  Yes Owens Shark, NP  lidocaine-prilocaine (EMLA) cream Apply 1 application topically as needed. 10/29/14  Yes Owens Shark, NP  ondansetron (ZOFRAN) 4 MG tablet Take 1 tablet (4 mg total) by mouth every 6 (six) hours. 07/10/14  Yes Dorie Rank, MD  oxyCODONE (ROXICODONE) 5 MG immediate release tablet Take 1 -2 tabs PO Q 4-6 hours PRN pain. 11/26/14  Yes Owens Shark, NP  potassium chloride SA (K-DUR,KLOR-CON) 20 MEQ tablet Take 1 tab twice daily for 3 days then take 1 tab daily Patient taking differently: Take 20 mEq by mouth daily.  10/15/14  Yes Owens Shark, NP  PRESCRIPTION MEDICATION Chemo   Yes Historical Provider, MD  prochlorperazine (COMPAZINE) 10 MG tablet Take 1 tablet (10 mg total) by mouth every 6 (six) hours as needed for nausea or vomiting. 09/03/14  Yes Susanne Borders, NP  warfarin (COUMADIN) 4 MG tablet Take as directed by coumadin clinic Patient taking differently: Take 4-8 mg by mouth daily. Alternates taking 8mg  and 4mg  each day. Took 8mg  Wed 12/04/14. 11/26/14  Yes Elby Showers  Marcello Moores, NP  sertraline (ZOLOFT) 100 MG tablet Take 1 tablet (100 mg total) by mouth daily. Patient not taking: Reported on 12/05/2014 05/17/14   Boykin Nearing, MD   BP 112/71 mmHg  Pulse 85  Temp(Src) 98 F (36.7 C) (Oral)  Resp 18  SpO2 97% Physical Exam  Constitutional: She appears well-developed.  HENT:  Head: Atraumatic.  Patient has very short hair  Eyes: Pupils are equal, round, and reactive to light.  Cardiovascular: Regular rhythm.   Mild tachycardia  Pulmonary/Chest: Effort normal.  Abdominal: There is tenderness.   Mild diffuse tenderness worse in left lower quadrant.  Musculoskeletal: She exhibits no edema.  Neurological: She is alert.  Skin: Skin is warm.    ED Course  Procedures (including critical care time) Labs Review Labs Reviewed  COMPREHENSIVE METABOLIC PANEL - Abnormal; Notable for the following:    Glucose, Bld 105 (*)    Alkaline Phosphatase 167 (*)    All other components within normal limits  CBC WITH DIFFERENTIAL/PLATELET - Abnormal; Notable for the following:    RBC 2.92 (*)    Hemoglobin 9.0 (*)    HCT 28.1 (*)    RDW 18.3 (*)    All other components within normal limits  URINALYSIS, ROUTINE W REFLEX MICROSCOPIC - Abnormal; Notable for the following:    Color, Urine AMBER (*)    Hgb urine dipstick TRACE (*)    Leukocytes, UA SMALL (*)    All other components within normal limits  PROTIME-INR - Abnormal; Notable for the following:    Prothrombin Time 41.0 (*)    INR 4.23 (*)    All other components within normal limits  LIPASE, BLOOD  URINE MICROSCOPIC-ADD ON  I-STAT CG4 LACTIC ACID, ED    Imaging Review Ct Abdomen Pelvis W Contrast  12/05/2014   CLINICAL DATA:  LEFT lower quadrant and mid abdominal pain, nausea, vomiting, diarrhea, past history of small bowel adenocarcinoma with ongoing chemotherapy  EXAM: CT ABDOMEN AND PELVIS WITH CONTRAST  TECHNIQUE: Multidetector CT imaging of the abdomen and pelvis was performed using the standard protocol following bolus administration of intravenous contrast. Sagittal and coronal MPR images reconstructed from axial data set.  CONTRAST:  163mL OMNIPAQUE IOHEXOL 300 MG/ML SOLN IV. Dilute oral contrast.  COMPARISON:  10/14/2014  FINDINGS: Lung bases clear.  Liver, gallbladder, spleen, pancreas, kidneys, and adrenal glands normal.  Wall thickening of the mid stomach likely representing tumor.  Peritoneal based tumor nodules are identified within the perigastric fat and in the LEFT upper quadrant as well as in the midline in the pelvis.   Pelvic lesion measures 18 x 15 mm image 60 previously 11 x 9 mm  Thickening of the surgical scar the anterior abdominal wall is again identified, inferior aspects slightly more nodular and more prominent than on the previous exam.  Probable wall thickening at the RIGHT lateral aspect of the rectum unchanged.  Fluid and gas within mildly distended colon.  Single dilated small bowel loop in the mid abdomen at site of anastomosis.  No free intraperitoneal fluid or air.  Normal size retroperitoneal nodes without definite abdominal or pelvic adenopathy.  No hernia identified.  Sclerotic focus RIGHT iliac wing 12 mm diameter not significantly changed.  No new osseous abnormalities.  IMPRESSION: Omental/peritoneal disease with multiple tumor nodules particularly in the perigastric region and omentum, some of which appear increased in size.  Persistent wall thickening of the mid stomach and of the RIGHT lateral aspect of the rectum of suspicious for  tumor.  Question tumor at the surgical scar at the ventral abdominal wall, minimally more prominent.  Overall findings suggest mild progression of disease.   Electronically Signed   By: Lavonia Dana M.D.   On: 12/05/2014 14:04     EKG Interpretation None      MDM   Final diagnoses:  Abdominal pain, unspecified abdominal location  Metastatic cancer    Patient with nausea vomiting diarrhea and abdominal pain. On chemotherapy for abdominal cancer. Not relieved by medications at home. CT shows worsening of her cancer. No obstruction. Will admit to internal medicine for pain control and IV fluids.    Davonna Belling, MD 12/05/14 [phone]

## 2014-12-05 NOTE — ED Notes (Signed)
Nurse currently drawing labs from pt's port.

## 2014-12-05 NOTE — ED Notes (Signed)
Admitting RN at bedside.  

## 2014-12-06 DIAGNOSIS — G43A1 Cyclical vomiting, intractable: Secondary | ICD-10-CM

## 2014-12-06 DIAGNOSIS — D6481 Anemia due to antineoplastic chemotherapy: Secondary | ICD-10-CM

## 2014-12-06 DIAGNOSIS — C7989 Secondary malignant neoplasm of other specified sites: Secondary | ICD-10-CM

## 2014-12-06 DIAGNOSIS — R109 Unspecified abdominal pain: Secondary | ICD-10-CM | POA: Insufficient documentation

## 2014-12-06 LAB — COMPREHENSIVE METABOLIC PANEL
ALT: 8 U/L (ref 0–35)
ANION GAP: 4 — AB (ref 5–15)
AST: 14 U/L (ref 0–37)
Albumin: 3.3 g/dL — ABNORMAL LOW (ref 3.5–5.2)
Alkaline Phosphatase: 133 U/L — ABNORMAL HIGH (ref 39–117)
BILIRUBIN TOTAL: 0.5 mg/dL (ref 0.3–1.2)
BUN: <5 mg/dL — ABNORMAL LOW (ref 6–23)
CHLORIDE: 106 mmol/L (ref 96–112)
CO2: 25 mmol/L (ref 19–32)
CREATININE: 0.56 mg/dL (ref 0.50–1.10)
Calcium: 8.4 mg/dL (ref 8.4–10.5)
GLUCOSE: 97 mg/dL (ref 70–99)
Potassium: 4 mmol/L (ref 3.5–5.1)
Sodium: 135 mmol/L (ref 135–145)
Total Protein: 5.7 g/dL — ABNORMAL LOW (ref 6.0–8.3)

## 2014-12-06 LAB — CBC
HEMATOCRIT: 25.5 % — AB (ref 36.0–46.0)
HEMOGLOBIN: 7.9 g/dL — AB (ref 12.0–15.0)
MCH: 30.5 pg (ref 26.0–34.0)
MCHC: 31 g/dL (ref 30.0–36.0)
MCV: 98.5 fL (ref 78.0–100.0)
Platelets: 193 10*3/uL (ref 150–400)
RBC: 2.59 MIL/uL — ABNORMAL LOW (ref 3.87–5.11)
RDW: 18.4 % — ABNORMAL HIGH (ref 11.5–15.5)
WBC: 5.9 10*3/uL (ref 4.0–10.5)

## 2014-12-06 LAB — PROTIME-INR
INR: 4.9 — ABNORMAL HIGH (ref 0.00–1.49)
PROTHROMBIN TIME: 46.1 s — AB (ref 11.6–15.2)

## 2014-12-06 MED ORDER — DIPHENHYDRAMINE HCL 50 MG/ML IJ SOLN: 12.5000 mg | Freq: Four times a day (QID) | INTRAMUSCULAR | Status: DC | PRN

## 2014-12-06 MED ORDER — DIPHENHYDRAMINE HCL 12.5 MG/5ML PO ELIX: 12.5000 mg | ORAL_SOLUTION | Freq: Four times a day (QID) | ORAL | Status: DC | PRN

## 2014-12-06 MED ORDER — NALOXONE HCL 0.4 MG/ML IJ SOLN: 0.4000 mg | INTRAMUSCULAR | Status: DC | PRN

## 2014-12-06 MED ORDER — SODIUM CHLORIDE 0.9 % IJ SOLN: 9.0000 mL | INTRAMUSCULAR | Status: DC | PRN

## 2014-12-06 MED ORDER — HYDROMORPHONE 0.3 MG/ML IV SOLN
INTRAVENOUS | Status: DC
  Administered 2014-12-06: 12:00:00 via INTRAVENOUS
  Administered 2014-12-06: 2.1 mg via INTRAVENOUS
  Administered 2014-12-07: 1.2 mg via INTRAVENOUS
  Filled 2014-12-06: qty 25

## 2014-12-06 MED ORDER — ONDANSETRON HCL 4 MG/2ML IJ SOLN: 4.0000 mg | Freq: Four times a day (QID) | INTRAMUSCULAR | Status: DC | PRN

## 2014-12-06 NOTE — Progress Notes (Signed)
ANTICOAGULATION CONSULT NOTE - follow up  Pharmacy Consult for Warfarin Indication: h/o PE  Allergies  Allergen Reactions  . Xarelto [Rivaroxaban] Anaphylaxis, Nausea And Vomiting and Palpitations    Headache and fatigue     Patient Measurements: Weight: 172 lb 1.6 oz (78.064 kg)   Vital Signs: Temp: 98.6 F (37 C) (04/22 0636) Temp Source: Oral (04/22 0636) BP: 100/56 mmHg (04/22 0636) Pulse Rate: 88 (04/22 0636)  Labs:  Recent Labs  12/05/14 1204 12/06/14 0634  HGB 9.0* 7.9*  HCT 28.1* 25.5*  PLT 201 193  LABPROT 41.0* 46.1*  INR 4.23* 4.90*  CREATININE 0.56 0.56    Estimated Creatinine Clearance: 73.4 mL/min (by C-G formula based on Cr of 0.56).   Medical History: Past Medical History  Diagnosis Date  . 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 Warfarin - Pharmacist Dosing Inpatient   Does not apply q1800   Infusions:  . sodium chloride 100 mL/hr at 12/06/14 0426   PRN: acetaminophen **OR** acetaminophen, albuterol, morphine injection, ondansetron **OR** ondansetron (ZOFRAN) IV, oxyCODONE  Assessment: 54yo female with h/o gastric cancer s/p chemo on 4/12 and admitted due to nausea, vomitting, diarrhea and abdominal pain. Pt was on Warfarin 4mg  alternating with 8 mg PTA for h/o PE with last dose 4/20. I  Today 4/22: INR 4.9 (supratherapeutic) Advance diet to soft Hgb 7.9  Plts 193  Goal of Therapy:  INR 2-3 Monitor platelets by anticoagulation protocol: Yes   Plan:  -hold coumadin tonight -daily INR  Dolly Rias RPh 12/06/2014, 9:55 AM Pager [phone]

## 2014-12-06 NOTE — Progress Notes (Signed)
CARE MANAGEMENT NOTE 12/06/2014  Patient:  Czajka,Kasheena   Account Number:  [id number]  Date Initiated:  12/06/2014  Documentation initiated by:  Edwyna Shell  Subjective/Objective Assessment:   54 yo female admitted with N/V/D from home with spouse     Action/Plan:   discharge planning   Anticipated DC Date:  12/08/2014   Anticipated DC Plan:  HOME/SELF CARE  In-house referral  Interpreting [place]  CM consult      Choice offered to / List presented to:             Status of service:  Completed, signed off Medicare Important Message given?   (If response is "NO", the following Medicare IM given date fields will be blank) Date Medicare IM given:   Medicare IM given by:   Date Additional Medicare IM given:   Additional Medicare IM given by:    Discharge Disposition:  HOME/SELF CARE  Per UR Regulation:    If discussed at Long Length of Stay Meetings, dates discussed:    Comments:  12/06/14 Charise Killian RN BSN CM [phone] Spoke with patient and spouse with use of the telephone interpreter regarding follow up care and medication affordability. The patient stated that she used to follow up at the [facility] for coumadin follow up with labs but she did not need the follow up anymore and stopped going. She stated that the last visit to the [facility] was about 3 months ago. She did state that she has difficulty affording her medications now but when she went to the [facility] they would provide assistance with medications. With patient's permission contacted the [facility] and scheduled a follow up appointment. Provided a pamphlet for the [facility] with the appointment information to the patient. Patient and spouse had no other questions or concerns.

## 2014-12-06 NOTE — Progress Notes (Signed)
IP PROGRESS NOTE  Subjective:   She is known to me with a history of metastatic gastric cancer. She completed another cycle of FOLFIRI 11/26/2014. She presented last night with abdominal pain, nausea/vomiting, and diarrhea. I saw her this morning without the aid of an interpreter. She reports the onset of nausea/vomiting and diarrhea 12/02/2014. She vomited pain medication. She continues to have diarrhea this morning.  Objective: Vital signs in last 24 hours: Blood pressure 100/56, pulse 88, temperature 98.6 F (37 C), temperature source Oral, resp. rate 18, weight 172 lb 1.6 oz (78.064 kg), SpO2 100 %.  Intake/Output from previous day: 04/21 0701 - 04/22 0700 In: 10 [I.V.:10] Out: -   Physical Exam:  HEENT: No thrush or ulcers Lungs: Clear bilaterally Cardiac: Regular rate and rhythm Abdomen: Soft, tender in the mid abdomen, no mass Extremities: No leg edema   Portacath/PICC-without erythema  Lab Results:  Recent Labs  12/05/14 1204 12/06/14 0634  WBC 8.8 5.9  HGB 9.0* 7.9*  HCT 28.1* 25.5*  PLT 201 193    BMET  Recent Labs  12/05/14 1204 12/06/14 0634  NA 135 135  K 3.7 4.0  CL 101 106  CO2 24 25  GLUCOSE 105* 97  BUN 8 <5*  CREATININE 0.56 0.56  CALCIUM 8.6 8.4    Studies/Results: Ct Head Wo Contrast  12/05/2014   CLINICAL DATA:  Nausea, vomiting, diarrhea, on chemotherapy for gastric cancer  EXAM: CT HEAD WITHOUT CONTRAST  TECHNIQUE: Contiguous axial images were obtained from the base of the skull through the vertex without intravenous contrast.  COMPARISON:  07/10/2014  FINDINGS: No skull fracture is noted. Paranasal sinuses and mastoid air cells are unremarkable. No intracranial hemorrhage, mass effect or midline shift. No acute cortical infarction. Ventricular size is stable from prior exam. No mass lesion is noted on this unenhanced scan. No intra or extra-axial fluid collection.  IMPRESSION: No acute intracranial abnormality.  No significant change.    Electronically Signed   By: Lahoma Crocker M.D.   On: 12/05/2014 16:16   Ct Abdomen Pelvis W Contrast  12/05/2014   CLINICAL DATA:  LEFT lower quadrant and mid abdominal pain, nausea, vomiting, diarrhea, past history of small bowel adenocarcinoma with ongoing chemotherapy  EXAM: CT ABDOMEN AND PELVIS WITH CONTRAST  TECHNIQUE: Multidetector CT imaging of the abdomen and pelvis was performed using the standard protocol following bolus administration of intravenous contrast. Sagittal and coronal MPR images reconstructed from axial data set.  CONTRAST:  163mL OMNIPAQUE IOHEXOL 300 MG/ML SOLN IV. Dilute oral contrast.  COMPARISON:  10/14/2014  FINDINGS: Lung bases clear.  Liver, gallbladder, spleen, pancreas, kidneys, and adrenal glands normal.  Wall thickening of the mid stomach likely representing tumor.  Peritoneal based tumor nodules are identified within the perigastric fat and in the LEFT upper quadrant as well as in the midline in the pelvis.  Pelvic lesion measures 18 x 15 mm image 60 previously 11 x 9 mm  Thickening of the surgical scar the anterior abdominal wall is again identified, inferior aspects slightly more nodular and more prominent than on the previous exam.  Probable wall thickening at the RIGHT lateral aspect of the rectum unchanged.  Fluid and gas within mildly distended colon.  Single dilated small bowel loop in the mid abdomen at site of anastomosis.  No free intraperitoneal fluid or air.  Normal size retroperitoneal nodes without definite abdominal or pelvic adenopathy.  No hernia identified.  Sclerotic focus RIGHT iliac wing 12 mm diameter not significantly  changed.  No new osseous abnormalities.  IMPRESSION: Omental/peritoneal disease with multiple tumor nodules particularly in the perigastric region and omentum, some of which appear increased in size.  Persistent wall thickening of the mid stomach and of the RIGHT lateral aspect of the rectum of suspicious for tumor.  Question tumor at the  surgical scar at the ventral abdominal wall, minimally more prominent.  Overall findings suggest mild progression of disease.   Electronically Signed   By: Lavonia Dana M.D.   On: 12/05/2014 14:04    Medications: I have reviewed the patient's current medications.  Assessment/Plan:  1. Stage IV gastric cancer  Status post bilateral oophorectomy, total abdominal hysterectomy, omentectomy, resection of abdominal wall and pelvic masses, bilateral pelvic lymphadenectomy 10/18/2013 with the pathology confirming moderate to poorly differentiated adenocarcinoma  Stomach biopsy 11/28/2013 revealed adenocarcinoma  Status post 12 cycles of FOLFOX, last given 04/25/2014  Restaging CT 04/29/2014 with thickening of the stomach and nodular densities adjacent to the greater curvature-unchanged, faint groundglass nodularity in the lungs-nonspecific  Restaging CT 07/22/2014 with indeterminate lung nodules; moderate wall thickening in the body region of the stomach; fairly extensive omental disease and findings suspicious for abdominal wall and incisional disease.  Started systemic chemotherapy with FOLFIRI on 07/30/14.  Restaging CT evaluation 10/14/2014 following 5 cycles of FOLFIRI showed decreased nodularity in both lungs; overall stable omental nodularity; involution of the previously demonstrated ill-defined soft tissue mass involving the anterior abdominal wall incision.  Cycle 6 FOLFIRI 10/15/2014  Cycle 7 FOLFIRI 10/29/2014  Cycle 8 FOLFIRI 11/12/2014  Cycle 9 FOLFIRI 11/26/2014 (dose reduction of 5-fluorouracil due to mucositis)  CT abdomen/pelvis 12/05/2014 revealed mild progression of peritoneal/omental tumor     2. Oxaliplatin neuropathy     3. Bilateral upper and lower lobe pulmonary emboli within segmental branches-now maintained on Coumadin. Followed by Coumadin clinic at the [facility].     4.  Neutropenia related to chemotherapy 08/13/2014. Cycle 2 FOLFIRI held. Neulasta added  beginning with cycle 2 on 08/20/2014.     5.  Nausea following chemotherapy-the anti-emetic regimen was adjusted with cycle 4 FOLFIRI 09/17/2014     6.  Diarrhea following FOLFIRI-no improvement with Imodium. Lomotil initiated 10/15/2014. Significant improvement with Lomotil.     7.  Mouth sores following cycle 8 FOLFIRI. 5 fluorouracil dose reduced.     8.   Admission 12/05/2014 with nausea/vomiting and diarrhea     9.   Abdominal pain secondary to carcinomatosis and gastric tumor    10.  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 I have a low clinical suspicion for a C. difficile infection.  Recommendations: 1. Follow-up C. difficile result and if negative begin antidiarrhea medications 2. Management of Coumadin anticoagulation per pharmacy 3. Narcotic analgesics for pain 4. Please call oncology as needed over the weekend. I will check on her 12/09/2014 if she remains in the hospital. Outpatient follow-up is scheduled at the Cancer center for 12/10/2014.   LOS: 1 day   Dowell Hoon, Dominica Severin  12/06/2014, 8:51 AM

## 2014-12-06 NOTE — Progress Notes (Signed)
UR complete 

## 2014-12-06 NOTE — Progress Notes (Signed)
[facility] PROGRESS NOTE   Assessment/Plan: Abdominal pain/Acute diarrhea/*Nausea with vomiting - Likely related to chemotherapy for her gastric cancer. - C. difficile PCR pending. - Continue symptomatic treatment for her nausea started on a clear liquid diet.  Headache: CT of the head showed no acute findings.  Stage IV  Gastric cancer: - Her last day of chemotherapy was April 12 she received Lunesta on April 14,016. - CT of the abdomen and pelvis does show progression of her disease.  History of PE: At this point her INR is therapeutic continue Coumadin per pharmacy.  Normocytic anemia: Hemoglobin at baseline.  DVT Prophylaxis: On warfarin Code Status: Full code Family Communication: Discussed with patient and family  Disposition Plan: Admit to [place]    Consultants:  Oncology  Procedures:  CT head  Antibiotics:  none  HPI/Subjective: No complains, but cont to have diarhea  Objective: Filed Vitals:   12/05/14 1529 12/05/14 1617 12/05/14 2130 12/06/14 0636  BP: 119/68 126/68 104/62 100/56  Pulse: 86 84 75 88  Temp: 98.5 F (36.9 C) 98.4 F (36.9 C) 98.4 F (36.9 C) 98.6 F (37 C)  TempSrc: Oral Oral Oral Oral  Resp: 18 18 18 18   Weight:  78.064 kg (172 lb 1.6 oz)    SpO2: 99% 100% 100% 100%    Intake/Output Summary (Last 24 hours) at 12/06/14 1118 Last data filed at 12/06/14 0942  Gross per 24 hour  Intake    130 ml  Output      0 ml  Net    130 ml   Filed Weights   12/05/14 1617  Weight: 78.064 kg (172 lb 1.6 oz)    Exam:  General: Alert, awake, oriented x3, in no acute distress.  HEENT: No bruits, no goiter.  Heart: Regular rate and rhythm. Lungs: Good air movement, clear  Abdomen: Soft, epigastric tenderness,, nondistended, positive bowel sounds.  Neuro: Grossly intact, nonfocal.   Data Reviewed: Basic Metabolic Panel:  Recent Labs Lab 12/05/14 1204 12/06/14 0634  NA 135 135  K 3.7 4.0  CL 101 106  CO2 24 25   GLUCOSE 105* 97  BUN 8 <5*  CREATININE 0.56 0.56  CALCIUM 8.6 8.4   Liver Function Tests:  Recent Labs Lab 12/05/14 1204 12/06/14 0634  AST 15 14  ALT 9 8  ALKPHOS 167* 133*  BILITOT 0.3 0.5  PROT 6.5 5.7*  ALBUMIN 3.8 3.3*    Recent Labs Lab 12/05/14 1204  LIPASE 15   No results for input(s): AMMONIA in the last 168 hours. CBC:  Recent Labs Lab 12/05/14 1204 12/06/14 0634  WBC 8.8 5.9  NEUTROABS 6.6  --   HGB 9.0* 7.9*  HCT 28.1* 25.5*  MCV 96.2 98.5  PLT 201 193   Cardiac Enzymes: No results for input(s): CKTOTAL, CKMB, CKMBINDEX, TROPONINI in the last 168 hours. BNP (last 3 results) No results for input(s): BNP in the last 8760 hours.  ProBNP (last 3 results) No results for input(s): PROBNP in the last 8760 hours.  CBG: No results for input(s): GLUCAP in the last 168 hours.  No results found for this or any previous visit (from the past 240 hour(s)).   Studies: Ct Head Wo Contrast  12/05/2014   CLINICAL DATA:  Nausea, vomiting, diarrhea, on chemotherapy for gastric cancer  EXAM: CT HEAD WITHOUT CONTRAST  TECHNIQUE: Contiguous axial images were obtained from the base of the skull through the vertex without intravenous contrast.  COMPARISON:  07/10/2014  FINDINGS: No  skull fracture is noted. Paranasal sinuses and mastoid air cells are unremarkable. No intracranial hemorrhage, mass effect or midline shift. No acute cortical infarction. Ventricular size is stable from prior exam. No mass lesion is noted on this unenhanced scan. No intra or extra-axial fluid collection.  IMPRESSION: No acute intracranial abnormality.  No significant change.   Electronically Signed   By: Lahoma Crocker M.D.   On: 12/05/2014 16:16   Ct Abdomen Pelvis W Contrast  12/05/2014   CLINICAL DATA:  LEFT lower quadrant and mid abdominal pain, nausea, vomiting, diarrhea, past history of small bowel adenocarcinoma with ongoing chemotherapy  EXAM: CT ABDOMEN AND PELVIS WITH CONTRAST  TECHNIQUE:  Multidetector CT imaging of the abdomen and pelvis was performed using the standard protocol following bolus administration of intravenous contrast. Sagittal and coronal MPR images reconstructed from axial data set.  CONTRAST:  137mL OMNIPAQUE IOHEXOL 300 MG/ML SOLN IV. Dilute oral contrast.  COMPARISON:  10/14/2014  FINDINGS: Lung bases clear.  Liver, gallbladder, spleen, pancreas, kidneys, and adrenal glands normal.  Wall thickening of the mid stomach likely representing tumor.  Peritoneal based tumor nodules are identified within the perigastric fat and in the LEFT upper quadrant as well as in the midline in the pelvis.  Pelvic lesion measures 18 x 15 mm image 60 previously 11 x 9 mm  Thickening of the surgical scar the anterior abdominal wall is again identified, inferior aspects slightly more nodular and more prominent than on the previous exam.  Probable wall thickening at the RIGHT lateral aspect of the rectum unchanged.  Fluid and gas within mildly distended colon.  Single dilated small bowel loop in the mid abdomen at site of anastomosis.  No free intraperitoneal fluid or air.  Normal size retroperitoneal nodes without definite abdominal or pelvic adenopathy.  No hernia identified.  Sclerotic focus RIGHT iliac wing 12 mm diameter not significantly changed.  No new osseous abnormalities.  IMPRESSION: Omental/peritoneal disease with multiple tumor nodules particularly in the perigastric region and omentum, some of which appear increased in size.  Persistent wall thickening of the mid stomach and of the RIGHT lateral aspect of the rectum of suspicious for tumor.  Question tumor at the surgical scar at the ventral abdominal wall, minimally more prominent.  Overall findings suggest mild progression of disease.   Electronically Signed   By: Lavonia Dana M.D.   On: 12/05/2014 14:04    Scheduled Meds: . Warfarin - Pharmacist Dosing Inpatient   Does not apply q1800   Continuous Infusions: . sodium chloride 100  mL/hr at 12/06/14 0426    Time Spent: 25 min   Charlynne Cousins  [facility] Pager [phone]. If 7PM-7AM, please contact night-coverage at [email or web address], password [facility] 12/06/2014, 11:18 AM  LOS: 1 day

## 2014-12-07 DIAGNOSIS — R1013 Epigastric pain: Secondary | ICD-10-CM

## 2014-12-07 LAB — PROTIME-INR
INR: 4.47 — ABNORMAL HIGH (ref 0.00–1.49)
PROTHROMBIN TIME: 42.9 s — AB (ref 11.6–15.2)

## 2014-12-07 LAB — CLOSTRIDIUM DIFFICILE BY PCR: Toxigenic C. Difficile by PCR: NEGATIVE

## 2014-12-07 MED ORDER — SODIUM CHLORIDE 0.9 % IJ SOLN
10.0000 mL | INTRAMUSCULAR | Status: DC | PRN
  Administered 2014-12-07: 10 mL
  Filled 2014-12-07: qty 40

## 2014-12-07 MED ORDER — OXYCODONE HCL 5 MG PO TABS: ORAL_TABLET | ORAL | Status: DC

## 2014-12-07 MED ORDER — HEPARIN SOD (PORK) LOCK FLUSH 100 UNIT/ML IV SOLN
500.0000 [IU] | INTRAVENOUS | Status: AC | PRN
  Administered 2014-12-07: 500 [IU]

## 2014-12-07 MED ORDER — DIPHENOXYLATE-ATROPINE 2.5-0.025 MG PO TABS: 1.0000 | ORAL_TABLET | Freq: Four times a day (QID) | ORAL | Status: DC | PRN

## 2014-12-07 NOTE — Progress Notes (Signed)
Pt left at this time with her husband. Discharge instructions/prescription given/explained with pt and spouse verbalizing understanding. (printed instructions given in Spanish) Pt without c/o. Alert, oriented, ambulatory.

## 2014-12-07 NOTE — Progress Notes (Signed)
ANTICOAGULATION CONSULT NOTE - follow up  Pharmacy Consult for Warfarin Indication: h/o PE  Allergies  Allergen Reactions  . Xarelto [Rivaroxaban] Anaphylaxis, Nausea And Vomiting and Palpitations    Headache and fatigue     Patient Measurements: Weight: 172 lb 1.6 oz (78.064 kg)   Vital Signs: Temp: 98.1 F (36.7 C) (04/23 0539) Temp Source: Oral (04/23 0539) BP: 119/65 mmHg (04/23 0539) Pulse Rate: 77 (04/23 0539)  Labs:  Recent Labs  12/05/14 1204 12/06/14 0634 12/07/14 0630  HGB 9.0* 7.9*  --   HCT 28.1* 25.5*  --   PLT 201 193  --   LABPROT 41.0* 46.1* 42.9*  INR 4.23* 4.90* 4.47*  CREATININE 0.56 0.56  --     Estimated Creatinine Clearance: 73.4 mL/min (by C-G formula based on Cr of 0.56).   Medical History: Past Medical History  Diagnosis Date  . 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 HYDROmorphone PCA 0.3 mg/mL   Intravenous 6 times per day  . Warfarin - Pharmacist Dosing Inpatient   Does not apply q1800   Infusions:  . sodium chloride 100 mL/hr at 12/06/14 2341   PRN: acetaminophen **OR** acetaminophen, albuterol, diphenhydrAMINE **OR** diphenhydrAMINE, naloxone **AND** sodium chloride, ondansetron **OR** ondansetron (ZOFRAN) IV, oxyCODONE, sodium chloride  Assessment: 54yo female with h/o gastric cancer s/p chemo on 4/12 and admitted due to nausea, vomitting, diarrhea and abdominal pain. Pt was on Warfarin 4mg  alternating with 8 mg PTA for h/o PE with last dose 4/20.   Today 4/22:  INR 4.47 (supratherapeutic since admission)  CBC: No labs today, 4/22 labs reveal decrease in Hgb  Advance diet to soft on 4/22  Goal of Therapy:  INR 2-3 Monitor platelets by anticoagulation protocol: Yes   Plan:  -Continue to hold coumadin  -Daily INR  Doreene Eland, PharmD, BCPS.   Pager: [phone] 12/07/2014 8:32 AM

## 2014-12-07 NOTE — Discharge Summary (Signed)
Physician Discharge Summary  Alicia [name] ZOX:[id number] DOB: 05-25-1961 DOA: 12/05/2014  PCP: Minerva Ends, MD  Admit date: 12/05/2014 Discharge date: 12/07/2014  Time spent:25 minutes  Recommendations for Outpatient Follow-up:  1. Follow-up with oncology as an outpatient will review scans of the abdomen and pelvis.  Discharge Diagnoses:  Principal Problem:   Nausea with vomiting Active Problems:   PE (pulmonary embolism)   Anticoagulated on Coumadin   Gastric cancer   Abdominal pain   Acute diarrhea   AP (abdominal pain)   Discharge Condition: guarded  Diet recommendation: regular  Filed Weights   12/05/14 1617  Weight: 78.064 kg (172 lb 1.6 oz)    History of present illness:  54 y.o. female with a past medical history of gastric cancer and pulmonary embolism on warfarin, who last received chemotherapy on April 12. She received Neulasta on April 14. She's completed 9 cycles. Patient doesn't speak much [place]. Family member was present who could interpret. Information was also obtained from the ED provider notes. Patient complains of nausea, vomiting and diarrhea ongoing for the last 4 days. She's had loose watery stool. Denies any blood in the stool or in the emesis. Denies any fever or chills. She's had abdominal pain which is present diffusely all over the abdomen. It's 8-9 out of 10 in intensity. Aching pain without any radiation. She also mentions a headache in the top of her head. Not able to quantify. Denies any visual changes. No weakness on any one side of her body. Denies any recent changes to her medications.  Hospital Course:  Abdominal pain/Acute diarrhea/*Nausea with vomiting - Likely related to chemotherapy for her gastric cancer. - C. difficile PCR negative. - Her nausea improved with symptomatic treatment, she will continue narcotics at home. Her pain is controlled.  Headache: CT of the head showed no acute findings.  Stage IV Gastric cancer: -  Her last day of chemotherapy was April 12 she received Lunesta on April 14,016. - CT of the abdomen and pelvis does show progression of her disease , she'll follow-up with oncology to discuss results.  History of PE: At this point her INR is therapeutic continue Coumadin per pharmacy.  Normocytic anemia: Hemoglobin at baseline.  Procedures:  CT abd and pelvis  Consultations:  none  Discharge Exam: Filed Vitals:   12/07/14 0539  BP: 119/65  Pulse: 77  Temp: 98.1 F (36.7 C)  Resp: 18    General: A&O x3 Cardiovascular: RRR Respiratory: good air movement CTA B/L  Discharge Instructions   Discharge Instructions    Diet - low sodium heart healthy    Complete by:  As directed      Increase activity slowly    Complete by:  As directed           Current Discharge Medication List    CONTINUE these medications which have CHANGED   Details  oxyCODONE (ROXICODONE) 5 MG immediate release tablet Take 1 -2 tabs PO Q 4-6 hours PRN pain. Qty: 60 tablet, Refills: 0   Associated Diagnoses: Gastric cancer; Pulmonary nodules/lesions, multiple; PE (pulmonary embolism); Anticoagulated on Coumadin      CONTINUE these medications which have NOT CHANGED   Details  Alum & Mag Hydroxide-Simeth (MAGIC MOUTHWASH) SOLN Take 5 mLs by mouth 4 (four) times daily as needed for mouth pain. Qty: 300 mL, Refills: 1    diphenoxylate-atropine (LOMOTIL) 2.5-0.025 MG per tablet Take 2 tablets by mouth 4 (four) times daily as needed for diarrhea or loose stools.  Qty: 60 tablet, Refills: 2   Associated Diagnoses: Gastric cancer; Diarrhea; Hypokalemia    ondansetron (ZOFRAN) 4 MG tablet Take 1 tablet (4 mg total) by mouth every 6 (six) hours. Qty: 30 tablet, Refills: 0    potassium chloride SA (K-DUR,KLOR-CON) 20 MEQ tablet Take 1 tab twice daily for 3 days then take 1 tab daily Qty: 30 tablet, Refills: 2   Associated Diagnoses: Gastric cancer; Hypokalemia    PRESCRIPTION MEDICATION Chemo     prochlorperazine (COMPAZINE) 10 MG tablet Take 1 tablet (10 mg total) by mouth every 6 (six) hours as needed for nausea or vomiting. Qty: 30 tablet, Refills: 2   Associated Diagnoses: Gastric cancer; Pulmonary nodules/lesions, multiple    warfarin (COUMADIN) 4 MG tablet Take as directed by coumadin clinic Qty: 60 tablet, Refills: 1   Associated Diagnoses: Gastric cancer; PE (pulmonary embolism); Anticoagulated on Coumadin    sertraline (ZOLOFT) 100 MG tablet Take 1 tablet (100 mg total) by mouth daily. Qty: 30 tablet, Refills: 2   Associated Diagnoses: Depression      STOP taking these medications     lidocaine-prilocaine (EMLA) cream        Allergies  Allergen Reactions  . Xarelto [Rivaroxaban] Anaphylaxis, Nausea And Vomiting and Palpitations    Headache and fatigue    Follow-up Information    Follow up with [facility] On 12/13/2014.   Why:  appointment at 4:45, arrive at 4:30   Contact information:   [address] [place] [address] [phone]       The results of significant diagnostics from this hospitalization (including imaging, microbiology, ancillary and laboratory) are listed below for reference.    Significant Diagnostic Studies: Ct Head Wo Contrast  12/05/2014   CLINICAL DATA:  Nausea, vomiting, diarrhea, on chemotherapy for gastric cancer  EXAM: CT HEAD WITHOUT CONTRAST  TECHNIQUE: Contiguous axial images were obtained from the base of the skull through the vertex without intravenous contrast.  COMPARISON:  07/10/2014  FINDINGS: No skull fracture is noted. Paranasal sinuses and mastoid air cells are unremarkable. No intracranial hemorrhage, mass effect or midline shift. No acute cortical infarction. Ventricular size is stable from prior exam. No mass lesion is noted on this unenhanced scan. No intra or extra-axial fluid collection.  IMPRESSION: No acute intracranial abnormality.  No significant change.    Electronically Signed   By: Lahoma Crocker M.D.   On: 12/05/2014 16:16   Ct Abdomen Pelvis W Contrast  12/05/2014   CLINICAL DATA:  LEFT lower quadrant and mid abdominal pain, nausea, vomiting, diarrhea, past history of small bowel adenocarcinoma with ongoing chemotherapy  EXAM: CT ABDOMEN AND PELVIS WITH CONTRAST  TECHNIQUE: Multidetector CT imaging of the abdomen and pelvis was performed using the standard protocol following bolus administration of intravenous contrast. Sagittal and coronal MPR images reconstructed from axial data set.  CONTRAST:  181mL OMNIPAQUE IOHEXOL 300 MG/ML SOLN IV. Dilute oral contrast.  COMPARISON:  10/14/2014  FINDINGS: Lung bases clear.  Liver, gallbladder, spleen, pancreas, kidneys, and adrenal glands normal.  Wall thickening of the mid stomach likely representing tumor.  Peritoneal based tumor nodules are identified within the perigastric fat and in the LEFT upper quadrant as well as in the midline in the pelvis.  Pelvic lesion measures 18 x 15 mm image 60 previously 11 x 9 mm  Thickening of the surgical scar the anterior abdominal wall is again identified, inferior aspects slightly more nodular and more prominent than on the  previous exam.  Probable wall thickening at the RIGHT lateral aspect of the rectum unchanged.  Fluid and gas within mildly distended colon.  Single dilated small bowel loop in the mid abdomen at site of anastomosis.  No free intraperitoneal fluid or air.  Normal size retroperitoneal nodes without definite abdominal or pelvic adenopathy.  No hernia identified.  Sclerotic focus RIGHT iliac wing 12 mm diameter not significantly changed.  No new osseous abnormalities.  IMPRESSION: Omental/peritoneal disease with multiple tumor nodules particularly in the perigastric region and omentum, some of which appear increased in size.  Persistent wall thickening of the mid stomach and of the RIGHT lateral aspect of the rectum of suspicious for tumor.  Question tumor at the  surgical scar at the ventral abdominal wall, minimally more prominent.  Overall findings suggest mild progression of disease.   Electronically Signed   By: Lavonia Dana M.D.   On: 12/05/2014 14:04    Microbiology: Recent Results (from the past 240 hour(s))  Clostridium Difficile by PCR     Status: None   Collection Time: 12/06/14 11:42 PM  Result Value Ref Range Status   C difficile by pcr NEGATIVE NEGATIVE Final     Labs: Basic Metabolic Panel:  Recent Labs Lab 12/05/14 1204 12/06/14 0634  NA 135 135  K 3.7 4.0  CL 101 106  CO2 24 25  GLUCOSE 105* 97  BUN 8 <5*  CREATININE 0.56 0.56  CALCIUM 8.6 8.4   Liver Function Tests:  Recent Labs Lab 12/05/14 1204 12/06/14 0634  AST 15 14  ALT 9 8  ALKPHOS 167* 133*  BILITOT 0.3 0.5  PROT 6.5 5.7*  ALBUMIN 3.8 3.3*    Recent Labs Lab 12/05/14 1204  LIPASE 15   No results for input(s): AMMONIA in the last 168 hours. CBC:  Recent Labs Lab 12/05/14 1204 12/06/14 0634  WBC 8.8 5.9  NEUTROABS 6.6  --   HGB 9.0* 7.9*  HCT 28.1* 25.5*  MCV 96.2 98.5  PLT 201 193   Cardiac Enzymes: No results for input(s): CKTOTAL, CKMB, CKMBINDEX, TROPONINI in the last 168 hours. BNP: BNP (last 3 results) No results for input(s): BNP in the last 8760 hours.  ProBNP (last 3 results) No results for input(s): PROBNP in the last 8760 hours.  CBG: No results for input(s): GLUCAP in the last 168 hours.     Signed:  Charlynne Cousins  [facility] 12/07/2014, 9:37 AM

## 2014-12-07 NOTE — Progress Notes (Signed)
NCM spoke to pt via interpreter line. Pt aware of [place] appt on 12/13/2014. Her Medicaid is pending. Jonnie Finner RN CCM Case Mgmt phone [phone]

## 2014-12-10 ENCOUNTER — Ambulatory Visit (HOSPITAL_BASED_OUTPATIENT_CLINIC_OR_DEPARTMENT_OTHER): Payer: Self-pay | Admitting: Pharmacist

## 2014-12-10 ENCOUNTER — Telehealth: Payer: Self-pay | Admitting: Oncology

## 2014-12-10 ENCOUNTER — Ambulatory Visit: Payer: Self-pay

## 2014-12-10 ENCOUNTER — Ambulatory Visit (HOSPITAL_BASED_OUTPATIENT_CLINIC_OR_DEPARTMENT_OTHER): Payer: Self-pay | Admitting: Oncology

## 2014-12-10 ENCOUNTER — Other Ambulatory Visit: Payer: Self-pay | Admitting: *Deleted

## 2014-12-10 ENCOUNTER — Other Ambulatory Visit (HOSPITAL_BASED_OUTPATIENT_CLINIC_OR_DEPARTMENT_OTHER): Payer: Self-pay

## 2014-12-10 VITALS — BP 135/90 | HR 85 | Temp 98.2°F | Resp 16 | Ht 60.0 in | Wt 167.0 lb

## 2014-12-10 DIAGNOSIS — D701 Agranulocytosis secondary to cancer chemotherapy: Secondary | ICD-10-CM

## 2014-12-10 DIAGNOSIS — C169 Malignant neoplasm of stomach, unspecified: Secondary | ICD-10-CM

## 2014-12-10 DIAGNOSIS — R918 Other nonspecific abnormal finding of lung field: Secondary | ICD-10-CM

## 2014-12-10 DIAGNOSIS — Z452 Encounter for adjustment and management of vascular access device: Secondary | ICD-10-CM

## 2014-12-10 DIAGNOSIS — I2699 Other pulmonary embolism without acute cor pulmonale: Secondary | ICD-10-CM

## 2014-12-10 DIAGNOSIS — Z7901 Long term (current) use of anticoagulants: Secondary | ICD-10-CM

## 2014-12-10 DIAGNOSIS — Z95828 Presence of other vascular implants and grafts: Secondary | ICD-10-CM

## 2014-12-10 DIAGNOSIS — D6481 Anemia due to antineoplastic chemotherapy: Secondary | ICD-10-CM

## 2014-12-10 DIAGNOSIS — R197 Diarrhea, unspecified: Secondary | ICD-10-CM

## 2014-12-10 LAB — CBC WITH DIFFERENTIAL/PLATELET
BASO%: 0.5 % (ref 0.0–2.0)
Basophils Absolute: 0.1 10*3/uL (ref 0.0–0.1)
EOS%: 0.8 % (ref 0.0–7.0)
Eosinophils Absolute: 0.1 10*3/uL (ref 0.0–0.5)
HEMATOCRIT: 30.7 % — AB (ref 34.8–46.6)
HEMOGLOBIN: 9.4 g/dL — AB (ref 11.6–15.9)
LYMPH%: 17.4 % (ref 14.0–49.7)
MCH: 29.7 pg (ref 25.1–34.0)
MCHC: 30.6 g/dL — ABNORMAL LOW (ref 31.5–36.0)
MCV: 97.2 fL (ref 79.5–101.0)
MONO#: 1.1 10*3/uL — ABNORMAL HIGH (ref 0.1–0.9)
MONO%: 11.9 % (ref 0.0–14.0)
NEUT#: 6.6 10*3/uL — ABNORMAL HIGH (ref 1.5–6.5)
NEUT%: 69.4 % (ref 38.4–76.8)
PLATELETS: 311 10*3/uL (ref 145–400)
RBC: 3.16 10*6/uL — ABNORMAL LOW (ref 3.70–5.45)
RDW: 18.3 % — ABNORMAL HIGH (ref 11.2–14.5)
WBC: 9.5 10*3/uL (ref 3.9–10.3)
lymph#: 1.7 10*3/uL (ref 0.9–3.3)

## 2014-12-10 LAB — COMPREHENSIVE METABOLIC PANEL (CC13)
ALT: 11 U/L (ref 0–55)
AST: 14 U/L (ref 5–34)
Albumin: 3.5 g/dL (ref 3.5–5.0)
Alkaline Phosphatase: 149 U/L (ref 40–150)
Anion Gap: 13 mEq/L — ABNORMAL HIGH (ref 3–11)
BUN: 8.9 mg/dL (ref 7.0–26.0)
CALCIUM: 8.8 mg/dL (ref 8.4–10.4)
CO2: 22 mEq/L (ref 22–29)
CREATININE: 0.6 mg/dL (ref 0.6–1.1)
Chloride: 107 mEq/L (ref 98–109)
GLUCOSE: 99 mg/dL (ref 70–140)
POTASSIUM: 3.6 meq/L (ref 3.5–5.1)
Sodium: 141 mEq/L (ref 136–145)
TOTAL PROTEIN: 6.1 g/dL — AB (ref 6.4–8.3)
Total Bilirubin: 0.39 mg/dL (ref 0.20–1.20)

## 2014-12-10 LAB — PROTIME-INR
INR: 4.3 — ABNORMAL HIGH (ref 2.00–3.50)
Protime: 51.6 Seconds — ABNORMAL HIGH (ref 10.6–13.4)

## 2014-12-10 LAB — POCT INR: INR: 4.3

## 2014-12-10 MED ORDER — OXYCODONE HCL 5 MG PO TABS: ORAL_TABLET | ORAL | Status: DC

## 2014-12-10 MED ORDER — SODIUM CHLORIDE 0.9 % IJ SOLN
10.0000 mL | INTRAMUSCULAR | Status: DC | PRN
  Administered 2014-12-10 (×2): 10 mL via INTRAVENOUS
  Filled 2014-12-10: qty 10

## 2014-12-10 MED ORDER — HEPARIN SOD (PORK) LOCK FLUSH 100 UNIT/ML IV SOLN
500.0000 [IU] | INTRAVENOUS | Status: AC | PRN
  Administered 2014-12-10: 500 [IU]
  Filled 2014-12-10: qty 5

## 2014-12-10 MED ORDER — SODIUM CHLORIDE 0.9 % IJ SOLN
10.0000 mL | INTRAMUSCULAR | Status: AC | PRN
  Filled 2014-12-10: qty 10

## 2014-12-10 NOTE — Patient Instructions (Signed)

## 2014-12-10 NOTE — Patient Instructions (Signed)
INR above goal Hold coumadin x 2 days then Slightly decrease coumadin to 4mg  daily. Recheck INR with scheduled appts on 12/17/14; lab at 9:45am, Flush at 10am,  coumadin clinic at 10:30am.

## 2014-12-10 NOTE — Addendum Note (Signed)
Addended by: Domenic Schwab on: 12/10/2014 11:09 AM   Modules accepted: Orders, SmartSet

## 2014-12-10 NOTE — Progress Notes (Signed)
INR above goal today at 4.3 (goal 2-3) Pt seen by Dr. Benay Spice - pt will not be treated today and will return next week for treatment discussion Pt recently hospitalized with N/V and elevated INR Coumadin held x 2 days (INR remained > 4 until discharge)  and pt restarted coumadin on Saturday at regular dose Pt took 4 mg on Saturday and Sunday and 8 mg on Monday Appetite is still only "ok" per patient/translator This is improving but likely resulting in elevated INR No unusual bleeding or bruising No medication changes Will adjust dose Plan: Hold coumadin x 2 days then Slightly decrease coumadin to 4mg  daily. Recheck INR with scheduled appts on 12/17/14; lab at 9:45am, Flush at 10am,  coumadin clinic at 10:30am.

## 2014-12-10 NOTE — Telephone Encounter (Signed)
Gave avs & calendar for May. Sent message to schedule treatment. °

## 2014-12-10 NOTE — Progress Notes (Signed)
[facility] OFFICE PROGRESS NOTE   Diagnosis: Gastric cancer  INTERVAL HISTORY:   She was admitted for 21 2016 with nausea/vomiting and diarrhea. Her symptoms improved in the hospital and she was discharged 12/07/2014. A stool sample returned negative for the C. difficile toxin. She continues to have 2-3 bowel movements during the day and again at night. She has pain only when she feels the urgency to have a bowel movement. The periumbilical pain remains improved.  Objective:  Vital signs in last 24 hours:  Blood pressure 135/90, pulse 85, temperature 98.2 F (36.8 C), temperature source Oral, resp. rate 16, height 5' (1.524 m), weight 167 lb (75.751 kg), SpO2 100 %.    HEENT: No thrush or ulcers Lymphatics: No cervical, supraclavicular, or axillar nodes Resp: Lungs with inspiratory rhonchi at the left base, no respiratory distress Cardio: Regular rate and rhythm GI: No hepatosplenomegaly, soft and nontender. Masslike fullness surrounding the anterior aspect of the umbilicus. Vascular: No leg edema   Portacath/PICC-without erythema  Lab Results:  Lab Results  Component Value Date   WBC 9.5 12/10/2014   HGB 9.4* 12/10/2014   HCT 30.7* 12/10/2014   MCV 97.2 12/10/2014   PLT 311 12/10/2014   NEUTROABS 6.6* 12/10/2014   PT/INR 4.3   Lab Results  Component Value Date   CEA 4.3 07/30/2014    Imaging:  CT of the abdomen and pelvis 12/05/2014-slight increase in peritoneal tumor nodule, image reviewed with Alicia [name]  Medications: I have reviewed the patient's current medications.  Assessment/Plan: 1. Stage IV gastric cancer  Status post bilateral oophorectomy, total abdominal hysterectomy, omentectomy, resection of abdominal wall and pelvic masses, bilateral pelvic lymphadenectomy 10/18/2013 with the pathology confirming moderate to poorly differentiated adenocarcinoma  Stomach biopsy 11/28/2013 revealed adenocarcinoma  Status post 12 cycles of  FOLFOX, last given 04/25/2014  Restaging CT 04/29/2014 with thickening of the stomach and nodular densities adjacent to the greater curvature-unchanged, faint groundglass nodularity in the lungs-nonspecific  Restaging CT 07/22/2014 with indeterminate lung nodules; moderate wall thickening in the body region of the stomach; fairly extensive omental disease and findings suspicious for abdominal wall and incisional disease.  Started systemic chemotherapy with FOLFIRI on 07/30/14.  Restaging CT evaluation 10/14/2014 following 5 cycles of FOLFIRI showed decreased nodularity in both lungs; overall stable omental nodularity; involution of the previously demonstrated ill-defined soft tissue mass involving the anterior abdominal wall incision.  Cycle 6 FOLFIRI 10/15/2014  Cycle 7 FOLFIRI 10/29/2014  Cycle 8 FOLFIRI 11/12/2014  Cycle 9 FOLFIRI 11/26/2014 (dose reduction of 5-fluorouracil due to mucositis)  CT abdomen/pelvis 12/05/2014 revealed mild progression of peritoneal/omental tumor   2. Oxaliplatin neuropathy   3. Bilateral upper and lower lobe pulmonary emboli within segmental branches-now maintained on Coumadin. Followed by Coumadin clinic at the [facility].   4. Neutropenia related to chemotherapy 08/13/2014. Cycle 2 FOLFIRI held. Neulasta added beginning with cycle 2 on 08/20/2014.   5. Nausea following chemotherapy-the anti-emetic regimen was adjusted with cycle 4 FOLFIRI 09/17/2014   6. Diarrhea following FOLFIRI-no improvement with Imodium. Lomotil initiated 10/15/2014. She continues to have diarrhea despite Lomotil.   7. Mouth sores following cycle 8 FOLFIRI. 5 fluorouracil dose reduced.   8. Admission 12/05/2014 with nausea/vomiting and diarrhea-likely secondary to FOLFIRI chemotherapy   9. Abdominal pain secondary to carcinomatosis and gastric tumor   10. 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  [name] has persistent diarrhea. This is most likely secondary to chemotherapy. I reviewed  the CT images with her. There appears to be an increase in the size of multiple peritoneal/omental nodules.  She has experienced increased diarrhea with the last several cycles of chemotherapy. The CT suggest tumor progression. We decided to discontinue FOLFIRI.  She will return for an office visit next week. The plan is to initiate Ramucirumab treatment next week.  She will be evaluated at the [facility] anticoagulation clinic today.  Alicia Coder, MD  12/10/2014  10:39 AM

## 2014-12-12 ENCOUNTER — Ambulatory Visit: Payer: Self-pay

## 2014-12-13 ENCOUNTER — Inpatient Hospital Stay: Payer: Self-pay | Admitting: Family Medicine

## 2014-12-15 ENCOUNTER — Other Ambulatory Visit: Payer: Self-pay | Admitting: Oncology

## 2014-12-16 ENCOUNTER — Other Ambulatory Visit: Payer: Self-pay | Admitting: *Deleted

## 2014-12-16 MED ORDER — DEXAMETHASONE 2 MG PO TABS: 10.0000 mg | ORAL_TABLET | Freq: Every day | ORAL | Status: DC

## 2014-12-16 NOTE — Telephone Encounter (Signed)
Attempted to call pt, voicemail full. Left message with pt's brother to inform pt of Decadron Rx and instructions. Spanish language interpreter ([place] [phone], Vicente Males) instructed him to pick up prescription for pt to begin tonight. He voiced understanding via interpreter.

## 2014-12-17 ENCOUNTER — Telehealth: Payer: Self-pay | Admitting: Nurse Practitioner

## 2014-12-17 ENCOUNTER — Ambulatory Visit (HOSPITAL_BASED_OUTPATIENT_CLINIC_OR_DEPARTMENT_OTHER): Payer: Self-pay | Admitting: Nurse Practitioner

## 2014-12-17 ENCOUNTER — Other Ambulatory Visit: Payer: Self-pay | Admitting: Nurse Practitioner

## 2014-12-17 ENCOUNTER — Other Ambulatory Visit: Payer: Self-pay

## 2014-12-17 ENCOUNTER — Ambulatory Visit (HOSPITAL_BASED_OUTPATIENT_CLINIC_OR_DEPARTMENT_OTHER): Payer: Self-pay | Admitting: Pharmacist

## 2014-12-17 ENCOUNTER — Other Ambulatory Visit (HOSPITAL_BASED_OUTPATIENT_CLINIC_OR_DEPARTMENT_OTHER): Payer: Self-pay

## 2014-12-17 ENCOUNTER — Ambulatory Visit (HOSPITAL_BASED_OUTPATIENT_CLINIC_OR_DEPARTMENT_OTHER): Payer: Self-pay

## 2014-12-17 ENCOUNTER — Telehealth: Payer: Self-pay | Admitting: *Deleted

## 2014-12-17 ENCOUNTER — Ambulatory Visit: Payer: Self-pay

## 2014-12-17 VITALS — BP 147/76 | HR 79 | Temp 98.9°F | Resp 20

## 2014-12-17 VITALS — BP 139/81 | HR 92 | Temp 97.8°F | Resp 20 | Ht 60.0 in | Wt 165.8 lb

## 2014-12-17 DIAGNOSIS — I2699 Other pulmonary embolism without acute cor pulmonale: Secondary | ICD-10-CM

## 2014-12-17 DIAGNOSIS — D701 Agranulocytosis secondary to cancer chemotherapy: Secondary | ICD-10-CM

## 2014-12-17 DIAGNOSIS — C169 Malignant neoplasm of stomach, unspecified: Secondary | ICD-10-CM

## 2014-12-17 DIAGNOSIS — Z95828 Presence of other vascular implants and grafts: Secondary | ICD-10-CM

## 2014-12-17 DIAGNOSIS — R918 Other nonspecific abnormal finding of lung field: Secondary | ICD-10-CM

## 2014-12-17 DIAGNOSIS — Z5112 Encounter for antineoplastic immunotherapy: Secondary | ICD-10-CM

## 2014-12-17 DIAGNOSIS — Z5111 Encounter for antineoplastic chemotherapy: Secondary | ICD-10-CM

## 2014-12-17 DIAGNOSIS — D6481 Anemia due to antineoplastic chemotherapy: Secondary | ICD-10-CM

## 2014-12-17 DIAGNOSIS — Z7901 Long term (current) use of anticoagulants: Secondary | ICD-10-CM

## 2014-12-17 LAB — PROTIME-INR
INR: 1.8 — AB (ref 2.00–3.50)
Protime: 21.6 Seconds — ABNORMAL HIGH (ref 10.6–13.4)

## 2014-12-17 LAB — UA PROTEIN, DIPSTICK - CHCC: PROTEIN: NEGATIVE mg/dL

## 2014-12-17 LAB — POCT INR: INR: 1.8

## 2014-12-17 MED ORDER — DIPHENHYDRAMINE HCL 25 MG PO CAPS
ORAL_CAPSULE | ORAL | Status: AC
  Filled 2014-12-17: qty 2

## 2014-12-17 MED ORDER — SODIUM CHLORIDE 0.9 % IV SOLN
8.0000 mg/kg | Freq: Once | INTRAVENOUS | Status: AC
  Administered 2014-12-17: 600 mg via INTRAVENOUS
  Filled 2014-12-17: qty 60

## 2014-12-17 MED ORDER — PACLITAXEL CHEMO INJECTION 300 MG/50ML
80.0000 mg/m2 | Freq: Once | INTRAVENOUS | Status: AC
  Administered 2014-12-17: 144 mg via INTRAVENOUS
  Filled 2014-12-17: qty 24

## 2014-12-17 MED ORDER — ACETAMINOPHEN 325 MG PO TABS
650.0000 mg | ORAL_TABLET | Freq: Once | ORAL | Status: AC
  Administered 2014-12-17: 650 mg via ORAL

## 2014-12-17 MED ORDER — OXYCODONE HCL 5 MG PO TABS: ORAL_TABLET | ORAL | Status: DC

## 2014-12-17 MED ORDER — FAMOTIDINE IN NACL 20-0.9 MG/50ML-% IV SOLN
INTRAVENOUS | Status: AC
  Filled 2014-12-17: qty 50

## 2014-12-17 MED ORDER — SODIUM CHLORIDE 0.9 % IV SOLN
Freq: Once | INTRAVENOUS | Status: AC
  Administered 2014-12-17: 13:00:00 via INTRAVENOUS

## 2014-12-17 MED ORDER — SODIUM CHLORIDE 0.9 % IJ SOLN
10.0000 mL | INTRAMUSCULAR | Status: DC | PRN
  Administered 2014-12-17: 10 mL via INTRAVENOUS
  Filled 2014-12-17: qty 10

## 2014-12-17 MED ORDER — HEPARIN SOD (PORK) LOCK FLUSH 100 UNIT/ML IV SOLN
500.0000 [IU] | Freq: Once | INTRAVENOUS | Status: AC | PRN
  Administered 2014-12-17: 500 [IU]
  Filled 2014-12-17: qty 5

## 2014-12-17 MED ORDER — DIPHENHYDRAMINE HCL 50 MG/ML IJ SOLN
50.0000 mg | Freq: Once | INTRAMUSCULAR | Status: AC
  Administered 2014-12-17: 50 mg via INTRAVENOUS

## 2014-12-17 MED ORDER — FAMOTIDINE IN NACL 20-0.9 MG/50ML-% IV SOLN
20.0000 mg | Freq: Once | INTRAVENOUS | Status: AC
  Administered 2014-12-17: 20 mg via INTRAVENOUS

## 2014-12-17 MED ORDER — DIPHENHYDRAMINE HCL 50 MG/ML IJ SOLN
INTRAMUSCULAR | Status: AC
  Filled 2014-12-17: qty 1

## 2014-12-17 MED ORDER — SODIUM CHLORIDE 0.9 % IV SOLN
Freq: Once | INTRAVENOUS | Status: AC
  Administered 2014-12-17: 13:00:00 via INTRAVENOUS
  Filled 2014-12-17: qty 4

## 2014-12-17 MED ORDER — ACETAMINOPHEN 325 MG PO TABS
ORAL_TABLET | ORAL | Status: AC
  Filled 2014-12-17: qty 2

## 2014-12-17 MED ORDER — SODIUM CHLORIDE 0.9 % IJ SOLN
10.0000 mL | INTRAMUSCULAR | Status: DC | PRN
  Administered 2014-12-17: 10 mL
  Filled 2014-12-17: qty 10

## 2014-12-17 NOTE — Telephone Encounter (Signed)
Aaron Edelman with [facility] called Triage.  "Patient is here, she doesn't speak english well and has picked up the dexamethasone which is ordered to take 12-16-2014 at bedtime and 12-17-2014 at 0700.  Asking how to take her medications.  Would like to know was she treated today and if she should take the dexamethasone.  Best contact number per patient is [phone].  Consulted with [facility] Management Clinic APP.  Verbal orders received and read back for patient to take 10 mg total now, and instruct her it may disrupt tonight's sleep.  She did receive decadron 10 mg with pre-meds today.  Called patient at [phone]. "Mail box is full and you cannot leave a message."  Called brother (emergency contact) instructing him to tell her to take only five pills with food as soon/early as this evening.  He will try.   Called patient again x 2.  No answer and received same message "This mailbox is full.  You cannot leave a message.  Bye."

## 2014-12-17 NOTE — Telephone Encounter (Signed)
sent MW email to sch trmt-pt aware-gave pt updated sch

## 2014-12-17 NOTE — Telephone Encounter (Signed)
Why is she taking decadron tonigh??

## 2014-12-17 NOTE — Progress Notes (Signed)
INR = 1.8    Goal 2-3 INR slightly below goal range. Patient seen today with her daughter who is acting as her Optometrist. She will see Ned Card following this appt, then will start a new chemotherapy later this morning. Careplan appears to be for Cyramza. She will be taking dexamethasone 10 mg evening before and morning of chemotherapy, she has not started this yet. Since INR is below goal and patient is starting new medications, I would like to see her next week. Her daughter states that she is able to bring her next week. We will slightly increase Coumadin to 4mg  daily except 6 mg on Tuesday, Thursday, and Saturday We will recheck INR with scheduled appts on 12/24/14; lab at 9:15am, Flush at 9:15 am,  Coumadin clinic at 9:30am.   Theone Murdoch, PharmD

## 2014-12-17 NOTE — Telephone Encounter (Signed)
per pof to sch pt appt-gave pt copy of sch °

## 2014-12-17 NOTE — Patient Instructions (Signed)
[facility] Discharge Instructions for Patients Receiving Chemotherapy  Today you received the following chemotherapy agents Cyramza and Taxol.  To help prevent nausea and vomiting after your treatment, we encourage you to take your nausea medication as prescribed.   If you develop nausea and vomiting that is not controlled by your nausea medication, call the clinic.   BELOW ARE SYMPTOMS THAT SHOULD BE REPORTED IMMEDIATELY:  *FEVER GREATER THAN 100.5 F  *CHILLS WITH OR WITHOUT FEVER  NAUSEA AND VOMITING THAT IS NOT CONTROLLED WITH YOUR NAUSEA MEDICATION  *UNUSUAL SHORTNESS OF BREATH  *UNUSUAL BRUISING OR BLEEDING  TENDERNESS IN MOUTH AND THROAT WITH OR WITHOUT PRESENCE OF ULCERS  *URINARY PROBLEMS  *BOWEL PROBLEMS  UNUSUAL RASH Items with * indicate a potential emergency and should be followed up as soon as possible.  Feel free to call the clinic you have any questions or concerns. The clinic phone number is [phone].  Please show the [facility] at check-in to the Emergency Department and triage nurse.

## 2014-12-17 NOTE — Telephone Encounter (Signed)
Chemo scheduled per staff message °

## 2014-12-17 NOTE — Patient Instructions (Signed)

## 2014-12-17 NOTE — Progress Notes (Addendum)
[place] OFFICE PROGRESS NOTE   Diagnosis:  Gastric cancer  INTERVAL HISTORY:   [name] returns as scheduled. Abdominal pain is controlled with oxycodone. She denies nausea/vomiting. No diarrhea. She reports a good appetite.  Objective:  Vital signs in last 24 hours:  Blood pressure 139/81, pulse 92, temperature 97.8 F (36.6 C), temperature source Oral, resp. rate 20, height 5' (1.524 m), weight 165 lb 12.8 oz (75.206 kg), SpO2 100 %.    HEENT: No thrush or ulcers. Resp: Lungs clear bilaterally. Cardio: Regular rate and rhythm. GI: Abdomen is soft. No hepatomegaly. Persistent masslike fullness just superior to the umbilicus. Vascular: No leg edema. Port-A-Cath without erythema.  Lab Results:  Lab Results  Component Value Date   WBC 9.5 12/10/2014   HGB 9.4* 12/10/2014   HCT 30.7* 12/10/2014   MCV 97.2 12/10/2014   PLT 311 12/10/2014   NEUTROABS 6.6* 12/10/2014    Imaging:  No results found.  Medications: I have reviewed the patient's current medications.  Assessment/Plan: 1. Stage IV gastric cancer  Status post bilateral oophorectomy, total abdominal hysterectomy, omentectomy, resection of abdominal wall and pelvic masses, bilateral pelvic lymphadenectomy 10/18/2013 with the pathology confirming moderate to poorly differentiated adenocarcinoma  Stomach biopsy 11/28/2013 revealed adenocarcinoma  Status post 12 cycles of FOLFOX, last given 04/25/2014  Restaging CT 04/29/2014 with thickening of the stomach and nodular densities adjacent to the greater curvature-unchanged, faint groundglass nodularity in the lungs-nonspecific  Restaging CT 07/22/2014 with indeterminate lung nodules; moderate wall thickening in the body region of the stomach; fairly extensive omental disease and findings suspicious for abdominal wall and incisional disease.  Started systemic chemotherapy with FOLFIRI on 07/30/14.  Restaging CT evaluation 10/14/2014  following 5 cycles of FOLFIRI showed decreased nodularity in both lungs; overall stable omental nodularity; involution of the previously demonstrated ill-defined soft tissue mass involving the anterior abdominal wall incision.  Cycle 6 FOLFIRI 10/15/2014  Cycle 7 FOLFIRI 10/29/2014  Cycle 8 FOLFIRI 11/12/2014  Cycle 9 FOLFIRI 11/26/2014 (dose reduction of 5-fluorouracil due to mucositis)  CT abdomen/pelvis 12/05/2014 revealed mild progression of peritoneal/omental tumor  Initiation of weekly Taxol (3 weeks on/1 week off) and every 2 week Ramucirumab 12/17/2014   2. Oxaliplatin neuropathy   3. Bilateral upper and lower lobe pulmonary emboli within segmental branches-now maintained on Coumadin. Followed by Coumad[facility] clinic at the [facility]   4. Neutropenia related to chemotherapy 08/13/2014. Cycle 2 FOLFIRI held. Neulasta added beginning with cycle 2 on 08/20/2014.   5. Nausea following chemotherapy-the anti-emetic regimen was adjusted with cycle 4 FOLFIRI 09/17/2014   6. Diarrhea following FOLFIRI-no improvement with Imodium. Lomotil initiated 10/15/2014. She continues to have diarrhea despite Lomotil.   7. Mouth sores following cycle 8 FOLFIRI. 5 fluorouracil dose reduced.   8. Admission 12/05/2014 with nausea/vomiting and diarrhea-likely secondary to FOLFIRI chemotherapy   9. Abdominal pain secondary to carcinomatosis and gastric tumor   10. 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 She is scheduled to begin weekly Taxol (3 weeks out of 4) and every 2 week Ramucirumab today. We reviewed potential toxicities associated with Taxol including an allergic reaction, myelosuppression, hair loss, nausea, mouth sores, diarrhea or constipation, neuropathy. We reviewed potential toxicities associated with Ramucirumab including proteinuria, hypertension, bleeding, delayed wound healing, bowel perforation,  strokes, blood clots, allergic reaction. She is agreeable to proceed. She will return in one week for the second weekly Taxol. She will return for a follow-up visit in 2  weeks. She will contact the office in the interim with any problems.  Patient seen with Dr. Benay Spice.    Ned Card ANP/GNP-BC   12/17/2014  11:06 AM  This was a shared visit with Lattie Haw,. We discussed the Taxol/Ramucirumab regimen with Alicia [name] including a potential toxicities. She agrees to proceed.  Alicia [name], M.D.

## 2014-12-18 LAB — CEA: CEA: 5.8 ng/mL — AB (ref 0.0–5.0)

## 2014-12-18 NOTE — Telephone Encounter (Signed)
Prophylactically.  No documentation found in office note or treatment notes that anyone was aware the pre-med not taken before treatment yesterday.  Aaron Edelman gave her the medicine and told her not to take it.  I wasn't able to reach her last night or today.    I used [employer] this morning and learned she is not at home but at [employer] office and will return after 4:00 pm today.  Will call later.

## 2014-12-18 NOTE — Telephone Encounter (Signed)
Interpreter Almyra Free asked reason for calls to patient.  Per Krystal Clark did not take the steroid last evening.  She would like to know if she is to take the medicine before the next scheduled chemotherapy and if she is to receive Granix injections for immunity.  Almyra Free came by Triage after Mrs. Asra notified her Mackie Pai called her.  Will notify provider of this conversation with interpreter.

## 2014-12-24 ENCOUNTER — Ambulatory Visit: Payer: Self-pay | Admitting: Nurse Practitioner

## 2014-12-24 ENCOUNTER — Ambulatory Visit: Payer: Self-pay

## 2014-12-24 ENCOUNTER — Ambulatory Visit (HOSPITAL_BASED_OUTPATIENT_CLINIC_OR_DEPARTMENT_OTHER): Payer: Self-pay

## 2014-12-24 ENCOUNTER — Other Ambulatory Visit (HOSPITAL_BASED_OUTPATIENT_CLINIC_OR_DEPARTMENT_OTHER): Payer: Self-pay

## 2014-12-24 ENCOUNTER — Ambulatory Visit (HOSPITAL_BASED_OUTPATIENT_CLINIC_OR_DEPARTMENT_OTHER): Payer: Self-pay | Admitting: Pharmacist

## 2014-12-24 ENCOUNTER — Other Ambulatory Visit: Payer: Self-pay

## 2014-12-24 VITALS — BP 149/90 | HR 99 | Temp 97.2°F | Resp 17

## 2014-12-24 DIAGNOSIS — C169 Malignant neoplasm of stomach, unspecified: Secondary | ICD-10-CM

## 2014-12-24 DIAGNOSIS — Z5111 Encounter for antineoplastic chemotherapy: Secondary | ICD-10-CM

## 2014-12-24 DIAGNOSIS — I2699 Other pulmonary embolism without acute cor pulmonale: Secondary | ICD-10-CM

## 2014-12-24 DIAGNOSIS — D701 Agranulocytosis secondary to cancer chemotherapy: Secondary | ICD-10-CM

## 2014-12-24 DIAGNOSIS — Z95828 Presence of other vascular implants and grafts: Secondary | ICD-10-CM

## 2014-12-24 DIAGNOSIS — Z7901 Long term (current) use of anticoagulants: Secondary | ICD-10-CM

## 2014-12-24 DIAGNOSIS — D6481 Anemia due to antineoplastic chemotherapy: Secondary | ICD-10-CM

## 2014-12-24 LAB — COMPREHENSIVE METABOLIC PANEL (CC13)
ALT: 9 U/L (ref 0–55)
ANION GAP: 11 meq/L (ref 3–11)
AST: 11 U/L (ref 5–34)
Albumin: 3.6 g/dL (ref 3.5–5.0)
Alkaline Phosphatase: 107 U/L (ref 40–150)
BUN: 8.6 mg/dL (ref 7.0–26.0)
CHLORIDE: 104 meq/L (ref 98–109)
CO2: 24 meq/L (ref 22–29)
Calcium: 9 mg/dL (ref 8.4–10.4)
Creatinine: 0.6 mg/dL (ref 0.6–1.1)
EGFR: >90 mL/min/{1.73_m2} (ref >90)
Glucose: 99 mg/dl (ref 70–140)
Potassium: 3.9 mEq/L (ref 3.5–5.1)
Sodium: 139 mEq/L (ref 136–145)
TOTAL PROTEIN: 6.4 g/dL (ref 6.4–8.3)
Total Bilirubin: 0.73 mg/dL (ref 0.20–1.20)

## 2014-12-24 LAB — PROTIME-INR
INR: 2.1 (ref 2.00–3.50)
PROTIME: 25.2 s — AB (ref 10.6–13.4)

## 2014-12-24 LAB — CBC WITH DIFFERENTIAL/PLATELET
BASO%: 0.5 % (ref 0.0–2.0)
BASOS ABS: 0 10*3/uL (ref 0.0–0.1)
EOS%: 1.8 % (ref 0.0–7.0)
Eosinophils Absolute: 0.1 10*3/uL (ref 0.0–0.5)
HEMATOCRIT: 31.5 % — AB (ref 34.8–46.6)
HGB: 9.8 g/dL — ABNORMAL LOW (ref 11.6–15.9)
LYMPH%: 24.8 % (ref 14.0–49.7)
MCH: 29.9 pg (ref 25.1–34.0)
MCHC: 31.1 g/dL — AB (ref 31.5–36.0)
MCV: 96 fL (ref 79.5–101.0)
MONO#: 0.6 10*3/uL (ref 0.1–0.9)
MONO%: 9.3 % (ref 0.0–14.0)
NEUT#: 3.8 10*3/uL (ref 1.5–6.5)
NEUT%: 63.6 % (ref 38.4–76.8)
Platelets: 302 10*3/uL (ref 145–400)
RBC: 3.28 10*6/uL — AB (ref 3.70–5.45)
RDW: 17.6 % — ABNORMAL HIGH (ref 11.2–14.5)
WBC: 6 10*3/uL (ref 3.9–10.3)
lymph#: 1.5 10*3/uL (ref 0.9–3.3)

## 2014-12-24 LAB — POCT INR: INR: 2.1

## 2014-12-24 MED ORDER — HEPARIN SOD (PORK) LOCK FLUSH 100 UNIT/ML IV SOLN
500.0000 [IU] | Freq: Once | INTRAVENOUS | Status: AC | PRN
  Administered 2014-12-24: 500 [IU]
  Filled 2014-12-24: qty 5

## 2014-12-24 MED ORDER — SODIUM CHLORIDE 0.9 % IJ SOLN
10.0000 mL | INTRAMUSCULAR | Status: DC | PRN
  Administered 2014-12-24: 10 mL via INTRAVENOUS
  Filled 2014-12-24: qty 10

## 2014-12-24 MED ORDER — SODIUM CHLORIDE 0.9 % IJ SOLN
10.0000 mL | INTRAMUSCULAR | Status: DC | PRN
  Administered 2014-12-24: 10 mL
  Filled 2014-12-24: qty 10

## 2014-12-24 MED ORDER — SODIUM CHLORIDE 0.9 % IV SOLN
Freq: Once | INTRAVENOUS | Status: AC
  Administered 2014-12-24: 10:00:00 via INTRAVENOUS
  Filled 2014-12-24: qty 4

## 2014-12-24 MED ORDER — DIPHENHYDRAMINE HCL 50 MG/ML IJ SOLN
50.0000 mg | Freq: Once | INTRAMUSCULAR | Status: AC
  Administered 2014-12-24: 50 mg via INTRAVENOUS

## 2014-12-24 MED ORDER — FAMOTIDINE IN NACL 20-0.9 MG/50ML-% IV SOLN
20.0000 mg | Freq: Once | INTRAVENOUS | Status: AC
  Administered 2014-12-24: 20 mg via INTRAVENOUS

## 2014-12-24 MED ORDER — FAMOTIDINE IN NACL 20-0.9 MG/50ML-% IV SOLN
INTRAVENOUS | Status: AC
  Filled 2014-12-24: qty 50

## 2014-12-24 MED ORDER — SODIUM CHLORIDE 0.9 % IV SOLN
Freq: Once | INTRAVENOUS | Status: AC
  Administered 2014-12-24: 10:00:00 via INTRAVENOUS

## 2014-12-24 MED ORDER — PACLITAXEL CHEMO INJECTION 300 MG/50ML
80.0000 mg/m2 | Freq: Once | INTRAVENOUS | Status: AC
  Administered 2014-12-24: 144 mg via INTRAVENOUS
  Filled 2014-12-24: qty 24

## 2014-12-24 MED ORDER — HEPARIN SOD (PORK) LOCK FLUSH 100 UNIT/ML IV SOLN
500.0000 [IU] | Freq: Once | INTRAVENOUS | Status: DC
  Filled 2014-12-24: qty 5

## 2014-12-24 MED ORDER — DIPHENHYDRAMINE HCL 50 MG/ML IJ SOLN
INTRAMUSCULAR | Status: AC
  Filled 2014-12-24: qty 1

## 2014-12-24 NOTE — Patient Instructions (Signed)
[facility] Discharge Instructions for Patients Receiving Chemotherapy  Today you received the following chemotherapy agents: Taxol  To help prevent nausea and vomiting after your treatment, we encourage you to take your nausea medication as prescribed by your physician.   If you develop nausea and vomiting that is not controlled by your nausea medication, call the clinic.   BELOW ARE SYMPTOMS THAT SHOULD BE REPORTED IMMEDIATELY:  *FEVER GREATER THAN 100.5 F  *CHILLS WITH OR WITHOUT FEVER  NAUSEA AND VOMITING THAT IS NOT CONTROLLED WITH YOUR NAUSEA MEDICATION  *UNUSUAL SHORTNESS OF BREATH  *UNUSUAL BRUISING OR BLEEDING  TENDERNESS IN MOUTH AND THROAT WITH OR WITHOUT PRESENCE OF ULCERS  *URINARY PROBLEMS  *BOWEL PROBLEMS  UNUSUAL RASH Items with * indicate a potential emergency and should be followed up as soon as possible.  Feel free to call the clinic you have any questions or concerns. The clinic phone number is [phone].  Please show the CHEMO ALERT CARD at check-in to the Emergency Department and triage nurse.   

## 2014-12-24 NOTE — Progress Notes (Signed)
INR = 2.1   Goal 2-3 INR within goal range. Patient seen today with interpreter, Gregary Signs.   No medication changes. Patient states appetite is less, but she is still eating just lesser quantities more frequently. She passes a very small amount of blood with BMs; she states Dr. Benay Spice has told her that this is normal and she should expect this with her cancer. She will continue Coumadin to 4mg  daily except 6 mg on Tuesday, Thursday, and Saturday Recheck INR with scheduled appts on 12/31/14; lab at 10:45am, Flush at 11am,  Coumadin clinic at 11:30am, treatment at 12:30.    Note:  It is better to see her in the Coumadin clinic rather than infusion. She speaks no [place] and it is easier for the translator to translate in the quieter environment of the Coumadin clinic.  Theone Murdoch, PharmD

## 2014-12-24 NOTE — Patient Instructions (Signed)

## 2014-12-29 ENCOUNTER — Other Ambulatory Visit: Payer: Self-pay | Admitting: Oncology

## 2014-12-30 ENCOUNTER — Other Ambulatory Visit: Payer: Self-pay | Admitting: *Deleted

## 2014-12-31 ENCOUNTER — Other Ambulatory Visit: Payer: Self-pay

## 2014-12-31 ENCOUNTER — Other Ambulatory Visit (HOSPITAL_BASED_OUTPATIENT_CLINIC_OR_DEPARTMENT_OTHER): Payer: Self-pay

## 2014-12-31 ENCOUNTER — Ambulatory Visit (HOSPITAL_BASED_OUTPATIENT_CLINIC_OR_DEPARTMENT_OTHER): Payer: Self-pay | Admitting: Nurse Practitioner

## 2014-12-31 ENCOUNTER — Telehealth: Payer: Self-pay | Admitting: Nurse Practitioner

## 2014-12-31 ENCOUNTER — Ambulatory Visit: Payer: Self-pay

## 2014-12-31 ENCOUNTER — Other Ambulatory Visit: Payer: Self-pay | Admitting: Oncology

## 2014-12-31 ENCOUNTER — Telehealth: Payer: Self-pay | Admitting: *Deleted

## 2014-12-31 ENCOUNTER — Ambulatory Visit (HOSPITAL_BASED_OUTPATIENT_CLINIC_OR_DEPARTMENT_OTHER): Payer: Self-pay | Admitting: Pharmacist

## 2014-12-31 ENCOUNTER — Ambulatory Visit (HOSPITAL_BASED_OUTPATIENT_CLINIC_OR_DEPARTMENT_OTHER): Payer: Self-pay

## 2014-12-31 VITALS — BP 140/93 | HR 85 | Temp 98.4°F | Resp 18 | Ht 60.0 in | Wt 162.8 lb

## 2014-12-31 DIAGNOSIS — Z7901 Long term (current) use of anticoagulants: Secondary | ICD-10-CM

## 2014-12-31 DIAGNOSIS — G620 Drug-induced polyneuropathy: Secondary | ICD-10-CM

## 2014-12-31 DIAGNOSIS — D6481 Anemia due to antineoplastic chemotherapy: Secondary | ICD-10-CM

## 2014-12-31 DIAGNOSIS — C169 Malignant neoplasm of stomach, unspecified: Secondary | ICD-10-CM

## 2014-12-31 DIAGNOSIS — E876 Hypokalemia: Secondary | ICD-10-CM

## 2014-12-31 DIAGNOSIS — R918 Other nonspecific abnormal finding of lung field: Secondary | ICD-10-CM

## 2014-12-31 DIAGNOSIS — I2699 Other pulmonary embolism without acute cor pulmonale: Secondary | ICD-10-CM

## 2014-12-31 DIAGNOSIS — D701 Agranulocytosis secondary to cancer chemotherapy: Secondary | ICD-10-CM

## 2014-12-31 DIAGNOSIS — Z95828 Presence of other vascular implants and grafts: Secondary | ICD-10-CM

## 2014-12-31 DIAGNOSIS — Z5112 Encounter for antineoplastic immunotherapy: Secondary | ICD-10-CM

## 2014-12-31 LAB — COMPREHENSIVE METABOLIC PANEL (CC13)
ALBUMIN: 3.5 g/dL (ref 3.5–5.0)
ALT: 8 U/L (ref 0–55)
ANION GAP: 10 meq/L (ref 3–11)
AST: 13 U/L (ref 5–34)
Alkaline Phosphatase: 94 U/L (ref 40–150)
BUN: 8.5 mg/dL (ref 7.0–26.0)
CALCIUM: 8.7 mg/dL (ref 8.4–10.4)
CHLORIDE: 105 meq/L (ref 98–109)
CO2: 25 meq/L (ref 22–29)
CREATININE: 0.5 mg/dL — AB (ref 0.6–1.1)
EGFR: >90 mL/min/{1.73_m2} (ref >90)
GLUCOSE: 95 mg/dL (ref 70–140)
POTASSIUM: 3.7 meq/L (ref 3.5–5.1)
Sodium: 140 mEq/L (ref 136–145)
Total Bilirubin: 0.65 mg/dL (ref 0.20–1.20)
Total Protein: 6.2 g/dL — ABNORMAL LOW (ref 6.4–8.3)

## 2014-12-31 LAB — CBC WITH DIFFERENTIAL/PLATELET
BASO%: 1.2 % (ref 0.0–2.0)
Basophils Absolute: 0 10*3/uL (ref 0.0–0.1)
EOS%: 1.6 % (ref 0.0–7.0)
Eosinophils Absolute: 0.1 10*3/uL (ref 0.0–0.5)
HCT: 29.6 % — ABNORMAL LOW (ref 34.8–46.6)
HEMOGLOBIN: 9.5 g/dL — AB (ref 11.6–15.9)
LYMPH#: 1.1 10*3/uL (ref 0.9–3.3)
LYMPH%: 28 % (ref 14.0–49.7)
MCH: 30.2 pg (ref 25.1–34.0)
MCHC: 31.9 g/dL (ref 31.5–36.0)
MCV: 94.7 fL (ref 79.5–101.0)
MONO#: 0.4 10*3/uL (ref 0.1–0.9)
MONO%: 10.6 % (ref 0.0–14.0)
NEUT%: 58.6 % (ref 38.4–76.8)
NEUTROS ABS: 2.2 10*3/uL (ref 1.5–6.5)
Platelets: 309 10*3/uL (ref 145–400)
RBC: 3.13 10*6/uL — ABNORMAL LOW (ref 3.70–5.45)
RDW: 19.8 % — ABNORMAL HIGH (ref 11.2–14.5)
WBC: 3.8 10*3/uL — AB (ref 3.9–10.3)

## 2014-12-31 LAB — POCT INR: INR: 2.1

## 2014-12-31 LAB — PROTIME-INR
INR: 2.1 (ref 2.00–3.50)
Protime: 25.2 Seconds — ABNORMAL HIGH (ref 10.6–13.4)

## 2014-12-31 LAB — UA PROTEIN, DIPSTICK - CHCC: Protein, ur: <30 mg/dL

## 2014-12-31 MED ORDER — ACETAMINOPHEN 325 MG PO TABS
ORAL_TABLET | ORAL | Status: AC
  Filled 2014-12-31: qty 2

## 2014-12-31 MED ORDER — HEPARIN SOD (PORK) LOCK FLUSH 100 UNIT/ML IV SOLN
500.0000 [IU] | Freq: Once | INTRAVENOUS | Status: AC | PRN
  Administered 2014-12-31: 500 [IU]
  Filled 2014-12-31: qty 5

## 2014-12-31 MED ORDER — DIPHENHYDRAMINE HCL 50 MG/ML IJ SOLN
INTRAMUSCULAR | Status: AC
  Filled 2014-12-31: qty 1

## 2014-12-31 MED ORDER — ACETAMINOPHEN 325 MG PO TABS
650.0000 mg | ORAL_TABLET | Freq: Once | ORAL | Status: AC
  Administered 2014-12-31: 650 mg via ORAL

## 2014-12-31 MED ORDER — DIPHENHYDRAMINE HCL 50 MG/ML IJ SOLN
50.0000 mg | Freq: Once | INTRAMUSCULAR | Status: AC
  Administered 2014-12-31: 50 mg via INTRAVENOUS

## 2014-12-31 MED ORDER — SODIUM CHLORIDE 0.9 % IV SOLN
Freq: Once | INTRAVENOUS | Status: AC
  Administered 2014-12-31: 13:00:00 via INTRAVENOUS

## 2014-12-31 MED ORDER — SODIUM CHLORIDE 0.9 % IJ SOLN
10.0000 mL | INTRAMUSCULAR | Status: DC | PRN
Start: 2014-12-31 — End: 2014-12-31
  Administered 2014-12-31: 10 mL via INTRAVENOUS
  Filled 2014-12-31: qty 10

## 2014-12-31 MED ORDER — POTASSIUM CHLORIDE CRYS ER 20 MEQ PO TBCR: 20.0000 meq | EXTENDED_RELEASE_TABLET | Freq: Every day | ORAL | Status: DC

## 2014-12-31 MED ORDER — RAMUCIRUMAB CHEMO INJECTION 500 MG/50ML
8.0000 mg/kg | Freq: Once | INTRAVENOUS | Status: AC
  Administered 2014-12-31: 600 mg via INTRAVENOUS
  Filled 2014-12-31: qty 60

## 2014-12-31 MED ORDER — SODIUM CHLORIDE 0.9 % IJ SOLN
10.0000 mL | INTRAMUSCULAR | Status: DC | PRN
  Administered 2014-12-31: 10 mL
  Filled 2014-12-31: qty 10

## 2014-12-31 NOTE — Progress Notes (Signed)
[place] OFFICE PROGRESS NOTE   Diagnosis:  Gastric cancer  INTERVAL HISTORY:   Ms. Gutt returns as scheduled. She completed week 1 Taxol/ramucirumab 12/17/2014; week 2 Taxol 12/24/2014. She denies nausea/vomiting. No mouth sores. She had diarrhea for 3 days. Lomotil was effective. No rash. She had pre-existing numbness in the fingertips and feet. The numbness has significantly worsened and is now present below the knees bilaterally. The numbness does not interfere with her gait. She denies shortness of breath and chest pain. She occasionally notes a small amount of blood with bowel movements.  Objective:  Vital signs in last 24 hours:  Blood pressure 140/93, pulse 85, temperature 98.4 F (36.9 C), temperature source Oral, resp. rate 18, height 5' (1.524 m), weight 162 lb 12.8 oz (73.846 kg), SpO2 100 %.    HEENT: No thrush or ulcers. Resp: Lungs clear bilaterally. Cardio: Regular rate and rhythm. GI: Abdomen is soft and nontender. No hepatomegaly. Masslike fullness superior to the umbilicus appears smaller. Vascular: No leg edema. Neuro: Marked decrease in vibratory sense over the fingertips and feet per tuning fork exam.  Skin: No rash. Port-A-Cath without erythema.    Lab Results:  Lab Results  Component Value Date   WBC 3.8* 12/31/2014   HGB 9.5* 12/31/2014   HCT 29.6* 12/31/2014   MCV 94.7 12/31/2014   PLT 309 12/31/2014   NEUTROABS 2.2 12/31/2014    Imaging:  No results found.  Medications: I have reviewed the patient's current medications.  Assessment/Plan: 1. Stage IV gastric cancer  Status post bilateral oophorectomy, total abdominal hysterectomy, omentectomy, resection of abdominal wall and pelvic masses, bilateral pelvic lymphadenectomy 10/18/2013 with the pathology confirming moderate to poorly differentiated adenocarcinoma  Stomach biopsy 11/28/2013 revealed adenocarcinoma  Status post 12 cycles of FOLFOX, last given  04/25/2014  Restaging CT 04/29/2014 with thickening of the stomach and nodular densities adjacent to the greater curvature-unchanged, faint groundglass nodularity in the lungs-nonspecific  Restaging CT 07/22/2014 with indeterminate lung nodules; moderate wall thickening in the body region of the stomach; fairly extensive omental disease and findings suspicious for abdominal wall and incisional disease.  Started systemic chemotherapy with FOLFIRI on 07/30/14.  Restaging CT evaluation 10/14/2014 following 5 cycles of FOLFIRI showed decreased nodularity in both lungs; overall stable omental nodularity; involution of the previously demonstrated ill-defined soft tissue mass involving the anterior abdominal wall incision.  Cycle 6 FOLFIRI 10/15/2014  Cycle 7 FOLFIRI 10/29/2014  Cycle 8 FOLFIRI 11/12/2014  Cycle 9 FOLFIRI 11/26/2014 (dose reduction of 5-fluorouracil due to mucositis)  CT abdomen/pelvis 12/05/2014 revealed mild progression of peritoneal/omental tumor  Initiation of weekly Taxol (3 weeks on/1 week off) and every 2 week Ramucirumab 12/17/2014   2. Oxaliplatin neuropathy   3. Bilateral upper and lower lobe pulmonary emboli within segmental branches-now maintained on Coumadin. Followed by Coumadin clinic at the [facility].   4. Neutropenia related to chemotherapy 08/13/2014. Cycle 2 FOLFIRI held. Neulasta added beginning with cycle 2 on 08/20/2014.   5. Nausea following chemotherapy-the anti-emetic regimen was adjusted with cycle 4 FOLFIRI 09/17/2014   6. Diarrhea following FOLFIRI-no improvement with Imodium. Lomotil initiated 10/15/2014.    7. Mouth sores following cycle 8 FOLFIRI. 5 fluorouracil dose reduced.   8. Admission 12/05/2014 with nausea/vomiting and diarrhea-likely secondary to FOLFIRI chemotherapy   9. Abdominal pain secondary to carcinomatosis and gastric tumor   10. Anemia secondary to chemotherapy and chronic  disease    Disposition: She has completed 2 weekly treatments with Taxol and 1 treatment  with ramucirumab. She had pre-existing numbness in the hands and feet. The numbness has significantly increased since beginning Taxol and ramucirumab and now involves the lower legs bilaterally. We will hold Taxol today and proceed with ramucirumab alone. She will return for a follow-up visit in 2 weeks. We will reevaluate the numbness at that time. She will contact the office in the interim with any problems.  Plan reviewed with Dr. Benay Spice.  Ned Card ANP/GNP-BC   12/31/2014  12:06 PM

## 2014-12-31 NOTE — Telephone Encounter (Signed)
Pt confirmed labs/ov per 05/17 POF, gave pt AVS and Calendar.... KJ, sent msg to add chemo °

## 2014-12-31 NOTE — Progress Notes (Signed)
Pt seen prior to MD and infusion appmts today with an interpreter INR=2.1 on 6mg  Tu, Th and Sa and 4mg  other days No changes to report No missed doses or new meds Diet consistent  Continue Coumadin to 4mg  daily except 6 mg on Tuesday, Thursday, and Saturday Recheck INR with scheduled appts on 01/14/15; lab and flush at 9:30am, Coumadin clinic at 9:45am, Ned Card at 10:15am and treatment at 12:00.

## 2014-12-31 NOTE — Telephone Encounter (Signed)
Per staff message and POF I have scheduled appts. Advised scheduler of appts. JMW  

## 2014-12-31 NOTE — Patient Instructions (Signed)

## 2014-12-31 NOTE — Patient Instructions (Addendum)
[facility] Discharge Instructions for Patients Receiving Chemotherapy  Today you received the following chemotherapy agents Cyramza  To help prevent nausea and vomiting after your treatment, we encourage you to take your nausea medication Compazine 10mg  every 6 hours as needed. If you develop nausea and vomiting that is not controlled by your nausea medication, call the clinic.   BELOW ARE SYMPTOMS THAT SHOULD BE REPORTED IMMEDIATELY:  *FEVER GREATER THAN 100.5 F  *CHILLS WITH OR WITHOUT FEVER  NAUSEA AND VOMITING THAT IS NOT CONTROLLED WITH YOUR NAUSEA MEDICATION  *UNUSUAL SHORTNESS OF BREATH  *UNUSUAL BRUISING OR BLEEDING  TENDERNESS IN MOUTH AND THROAT WITH OR WITHOUT PRESENCE OF ULCERS  *URINARY PROBLEMS  *BOWEL PROBLEMS  UNUSUAL RASH Items with * indicate a potential emergency and should be followed up as soon as possible.  Feel free to call the clinic you have any questions or concerns. The clinic phone number is [phone].  Please show the [place] at check-in to the Emergency Department and triage nurse.

## 2014-12-31 NOTE — Patient Instructions (Signed)
Continue Coumadin to 4mg  daily except 6 mg on Tuesday, Thursday, and Saturday Recheck INR with scheduled appts on 01/14/15; lab and flush at 9:30am, Coumadin clinic at 9:45am, Ned Card at 10:15am and treatment at 12:00.

## 2015-01-01 IMAGING — CT CT CERVICAL SPINE W/O CM
2 of 4 series · 6 of 14 positions shown, 7 images · non-contrast
Comparison: None.

CLINICAL DATA: Recent fall in bathroom with left-sided headache,
denies loss of consciousness, neck pain, initial encounter

EXAM:
CT HEAD WITHOUT CONTRAST
CT CERVICAL SPINE WITHOUT CONTRAST
TECHNIQUE: Multidetector CT imaging of the head and cervical spine was
performed following the standard protocol without intravenous
contrast. Multiplanar CT image reconstructions of the cervical spine
were also generated.

[Series 4: c-spine st · axial · 0.35mm/px · z∈[-166,-76]mm · 3 of 91 slices shown]
[im 23/91  bone]
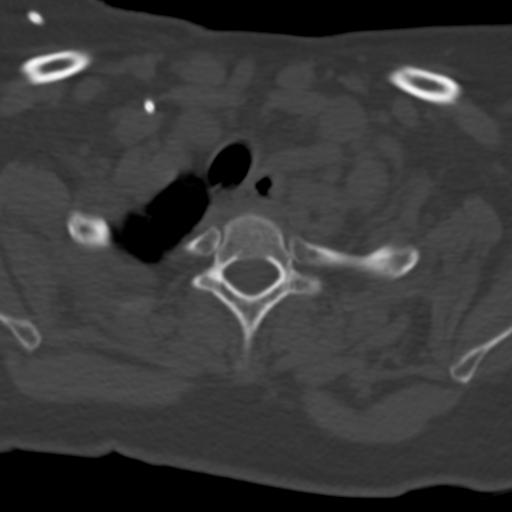
[im 46/91  bone]
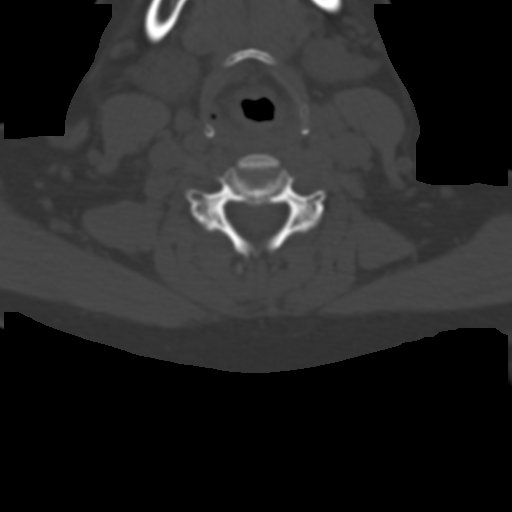
[im 68/91  bone]
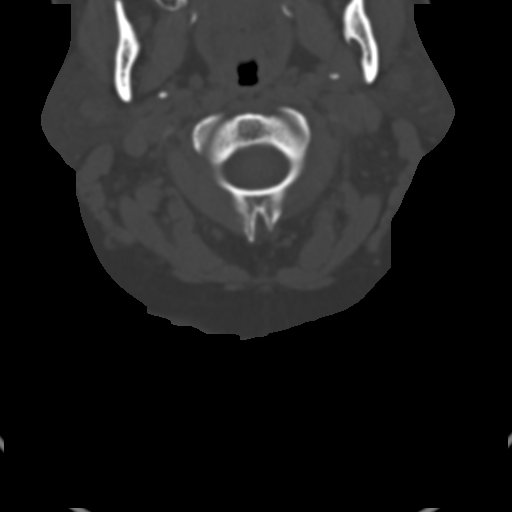

[Series 9: axial recon · axial · 0.23mm/px · z∈[-180,-95]mm · 3 of 90 slices shown, 4 images]
[im 23/90  soft-tissue]
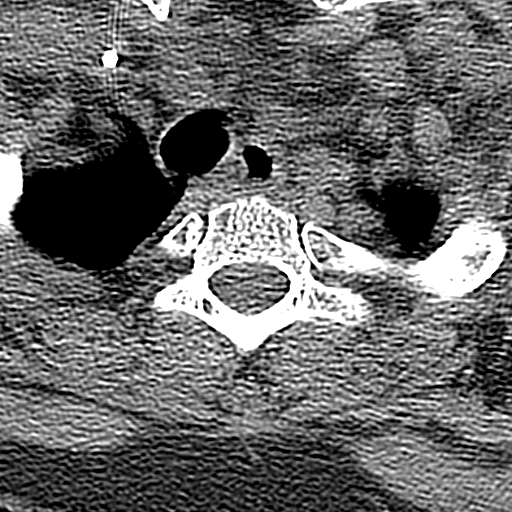
[im 23/90  bone]
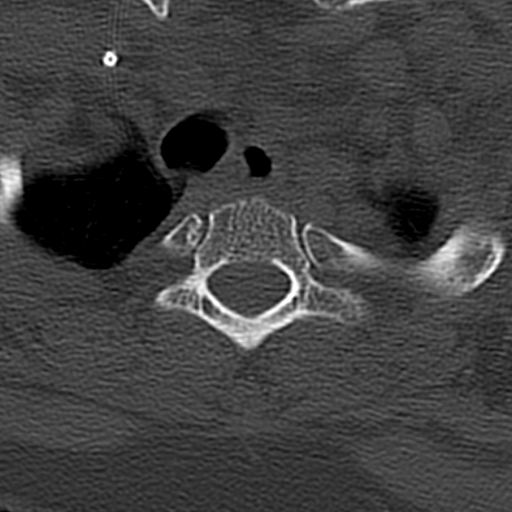
[im 45/90  bone]
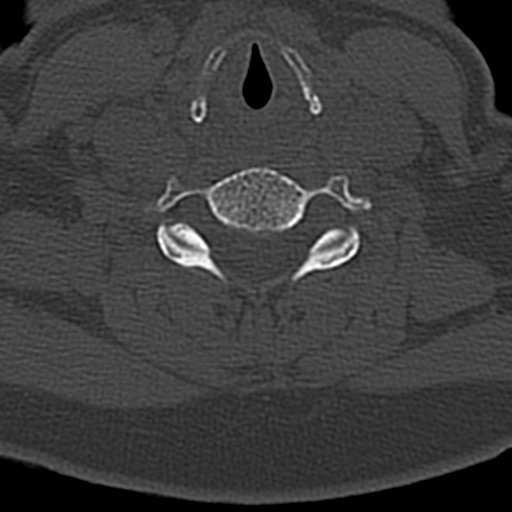
[im 67/90  bone]
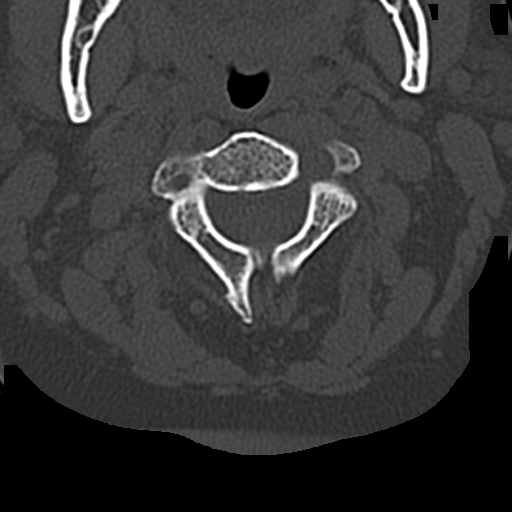

[6 of 14 positions shown; findings below may reference images not displayed]

FINDINGS: CT HEAD FINDINGS

The bony calvarium is intact. No acute hemorrhage, acute infarction
or space-occupying mass lesion is noted.

CT CERVICAL SPINE FINDINGS

Seven cervical segments are well visualized. Vertebral body height
is well maintained. Mild straightening of the normal cervical
lordosis is noted which may be related to muscular spasm. No
definitive fracture or acute facet abnormality is seen.

The surrounding soft tissues show evidence of a right-sided chest
wall port. Some patchy changes are noted in the posterior aspect of
the right upper lobe on the last image. These are incompletely
evaluated.
IMPRESSION: CT of the head:  No acute intracranial abnormality.

CT of the cervical spine: No acute fracture is noted. Mild
straightening of the normal cervical lordosis is seen.

Patchy changes in the posterior aspect of the right upper lobe of
uncertain significance. There incompletely evaluated on this exam.

## 2015-01-08 ENCOUNTER — Encounter (HOSPITAL_COMMUNITY): Payer: Self-pay

## 2015-01-08 ENCOUNTER — Emergency Department (HOSPITAL_COMMUNITY): Payer: Medicaid Other

## 2015-01-08 ENCOUNTER — Inpatient Hospital Stay (HOSPITAL_COMMUNITY)
Admission: EM | Admit: 2015-01-08 | Discharge: 2015-01-15 | DRG: 389 | Disposition: A | Discharge status: Home / Self Care | Payer: Medicaid Other | Attending: Internal Medicine | Admitting: Internal Medicine

## 2015-01-08 DIAGNOSIS — C800 Disseminated malignant neoplasm, unspecified: Secondary | ICD-10-CM | POA: Diagnosis present

## 2015-01-08 DIAGNOSIS — Z6832 Body mass index (BMI) 32.0-32.9, adult: Secondary | ICD-10-CM

## 2015-01-08 DIAGNOSIS — C7951 Secondary malignant neoplasm of bone: Secondary | ICD-10-CM | POA: Diagnosis present

## 2015-01-08 DIAGNOSIS — R319 Hematuria, unspecified: Secondary | ICD-10-CM | POA: Insufficient documentation

## 2015-01-08 DIAGNOSIS — E876 Hypokalemia: Secondary | ICD-10-CM | POA: Diagnosis not present

## 2015-01-08 DIAGNOSIS — D6481 Anemia due to antineoplastic chemotherapy: Secondary | ICD-10-CM | POA: Diagnosis present

## 2015-01-08 DIAGNOSIS — R109 Unspecified abdominal pain: Secondary | ICD-10-CM

## 2015-01-08 DIAGNOSIS — R1110 Vomiting, unspecified: Secondary | ICD-10-CM

## 2015-01-08 DIAGNOSIS — K921 Melena: Secondary | ICD-10-CM | POA: Diagnosis present

## 2015-01-08 DIAGNOSIS — G893 Neoplasm related pain (acute) (chronic): Secondary | ICD-10-CM | POA: Diagnosis present

## 2015-01-08 DIAGNOSIS — R112 Nausea with vomiting, unspecified: Secondary | ICD-10-CM | POA: Diagnosis present

## 2015-01-08 DIAGNOSIS — D638 Anemia in other chronic diseases classified elsewhere: Secondary | ICD-10-CM | POA: Diagnosis present

## 2015-01-08 DIAGNOSIS — T451X5A Adverse effect of antineoplastic and immunosuppressive drugs, initial encounter: Secondary | ICD-10-CM | POA: Diagnosis present

## 2015-01-08 DIAGNOSIS — K56609 Unspecified intestinal obstruction, unspecified as to partial versus complete obstruction: Secondary | ICD-10-CM | POA: Diagnosis present

## 2015-01-08 DIAGNOSIS — Z888 Allergy status to other drugs, medicaments and biological substances status: Secondary | ICD-10-CM

## 2015-01-08 DIAGNOSIS — C169 Malignant neoplasm of stomach, unspecified: Secondary | ICD-10-CM | POA: Diagnosis present

## 2015-01-08 DIAGNOSIS — E440 Moderate protein-calorie malnutrition: Secondary | ICD-10-CM | POA: Diagnosis present

## 2015-01-08 DIAGNOSIS — Z86711 Personal history of pulmonary embolism: Secondary | ICD-10-CM

## 2015-01-08 DIAGNOSIS — Z90710 Acquired absence of both cervix and uterus: Secondary | ICD-10-CM

## 2015-01-08 DIAGNOSIS — Z7901 Long term (current) use of anticoagulants: Secondary | ICD-10-CM

## 2015-01-08 DIAGNOSIS — I2699 Other pulmonary embolism without acute cor pulmonale: Secondary | ICD-10-CM | POA: Diagnosis present

## 2015-01-08 DIAGNOSIS — D701 Agranulocytosis secondary to cancer chemotherapy: Secondary | ICD-10-CM | POA: Diagnosis present

## 2015-01-08 DIAGNOSIS — K5660 Unspecified intestinal obstruction: Principal | ICD-10-CM | POA: Diagnosis present

## 2015-01-08 DIAGNOSIS — G620 Drug-induced polyneuropathy: Secondary | ICD-10-CM | POA: Diagnosis present

## 2015-01-08 DIAGNOSIS — N1330 Unspecified hydronephrosis: Secondary | ICD-10-CM | POA: Diagnosis present

## 2015-01-08 HISTORY — DX: Malignant neoplasm of stomach, unspecified: C16.9

## 2015-01-08 HISTORY — DX: Other pulmonary embolism without acute cor pulmonale: I26.99

## 2015-01-08 HISTORY — DX: Constipation, unspecified: K59.00

## 2015-01-08 LAB — COMPREHENSIVE METABOLIC PANEL
ALT: 9 U/L — AB (ref 14–54)
AST: 15 U/L (ref 15–41)
Albumin: 3.6 g/dL (ref 3.5–5.0)
Alkaline Phosphatase: 90 U/L (ref 38–126)
Anion gap: 9 (ref 5–15)
BUN: 9 mg/dL (ref 6–20)
CALCIUM: 8.6 mg/dL — AB (ref 8.9–10.3)
CO2: 28 mmol/L (ref 22–32)
CREATININE: 0.55 mg/dL (ref 0.44–1.00)
Chloride: 100 mmol/L — ABNORMAL LOW (ref 101–111)
GFR calc Af Amer: >60 mL/min (ref >60)
GFR calc non Af Amer: >60 mL/min (ref >60)
Glucose, Bld: 112 mg/dL — ABNORMAL HIGH (ref 65–99)
POTASSIUM: 3.1 mmol/L — AB (ref 3.5–5.1)
Sodium: 137 mmol/L (ref 135–145)
Total Bilirubin: 0.9 mg/dL (ref 0.3–1.2)
Total Protein: 6.3 g/dL — ABNORMAL LOW (ref 6.5–8.1)

## 2015-01-08 LAB — CBC WITH DIFFERENTIAL/PLATELET
BASOS ABS: 0 10*3/uL (ref 0.0–0.1)
BASOS PCT: 0 % (ref 0–1)
EOS PCT: 0 % (ref 0–5)
Eosinophils Absolute: 0 10*3/uL (ref 0.0–0.7)
HCT: 31.8 % — ABNORMAL LOW (ref 36.0–46.0)
Hemoglobin: 9.9 g/dL — ABNORMAL LOW (ref 12.0–15.0)
Lymphocytes Relative: 20 % (ref 12–46)
Lymphs Abs: 1.4 10*3/uL (ref 0.7–4.0)
MCH: 29.4 pg (ref 26.0–34.0)
MCHC: 31.1 g/dL (ref 30.0–36.0)
MCV: 94.4 fL (ref 78.0–100.0)
Monocytes Absolute: 0.8 10*3/uL (ref 0.1–1.0)
Monocytes Relative: 11 % (ref 3–12)
NEUTROS ABS: 4.8 10*3/uL (ref 1.7–7.7)
NEUTROS PCT: 69 % (ref 43–77)
PLATELETS: 287 10*3/uL (ref 150–400)
RBC: 3.37 MIL/uL — AB (ref 3.87–5.11)
RDW: 17.7 % — AB (ref 11.5–15.5)
WBC: 7 10*3/uL (ref 4.0–10.5)

## 2015-01-08 LAB — URINALYSIS, ROUTINE W REFLEX MICROSCOPIC
Glucose, UA: NEGATIVE mg/dL
HGB URINE DIPSTICK: NEGATIVE
KETONES UR: NEGATIVE mg/dL
Nitrite: NEGATIVE
PH: 6 (ref 5.0–8.0)
PROTEIN: NEGATIVE mg/dL
Specific Gravity, Urine: 1.022 (ref 1.005–1.030)
UROBILINOGEN UA: 1 mg/dL (ref 0.0–1.0)

## 2015-01-08 LAB — I-STAT CG4 LACTIC ACID, ED
LACTIC ACID, VENOUS: 0.36 mmol/L — AB (ref 0.5–2.0)
Lactic Acid, Venous: 0.46 mmol/L — ABNORMAL LOW (ref 0.5–2.0)

## 2015-01-08 LAB — URINE MICROSCOPIC-ADD ON

## 2015-01-08 LAB — LIPASE, BLOOD: Lipase: 11 U/L — ABNORMAL LOW (ref 22–51)

## 2015-01-08 MED ORDER — ONDANSETRON HCL 4 MG/2ML IJ SOLN
4.0000 mg | Freq: Once | INTRAMUSCULAR | Status: AC
  Administered 2015-01-08: 4 mg via INTRAVENOUS
  Filled 2015-01-08: qty 2

## 2015-01-08 MED ORDER — HYDROMORPHONE HCL 1 MG/ML IJ SOLN
1.0000 mg | Freq: Once | INTRAMUSCULAR | Status: AC
  Administered 2015-01-08: 1 mg via INTRAVENOUS
  Filled 2015-01-08: qty 1

## 2015-01-08 MED ORDER — IOHEXOL 300 MG/ML  SOLN
100.0000 mL | Freq: Once | INTRAMUSCULAR | Status: AC | PRN
  Administered 2015-01-08: 100 mL via INTRAVENOUS

## 2015-01-08 NOTE — ED Provider Notes (Signed)
CSN: [id number]     Arrival date & time 01/08/15  1852 History   First MD Initiated Contact with Patient 01/08/15 1957     Chief Complaint  Patient presents with  . Emesis  . Abdominal Pain     (Consider location/radiation/quality/duration/timing/severity/associated sxs/prior Treatment) HPI Comments: Patient presents emergency department with chief complaint of nausea, vomiting, and abdominal pain. Patient has a history of stage IV metastatic gastric cancer forward she is followed by Dr. Malachy Mood and is currently getting chemotherapy. Patient states that she began having nausea and vomiting yesterday. She reports having brown/red emesis. She also reports having a small amount of rectal bleeding, but she states this is normal for her and that her oncologist told her this is to be expected with the chemotherapy and the gastric cancer. Patient states that her pain is moderate to severe. It is worsened with palpation. She has tried taking oxycodone with no relief. She denies any fevers or chills.  The history is provided by the patient. No language interpreter was used.    Past Medical History  Diagnosis Date  . 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 Abdominal hysterectomy  10/18/2013    Family History  Problem Relation Age of Onset  . Heart disease Mother   . Diabetes Sister   . Heart disease Sister   . Heart disease Brother    History  Substance Use Topics  . Smoking status: Never Smoker   . Smokeless tobacco: Never Used  . Alcohol Use: No   OB History    No data available     Review of Systems  Constitutional: Negative for fever and chills.  Respiratory: Negative for shortness of breath.   Cardiovascular: Negative for chest pain.  Gastrointestinal: Positive for nausea, vomiting, abdominal pain and diarrhea. Negative for constipation.  Genitourinary: Negative for dysuria.  All other systems reviewed and  are negative.     Allergies  Xarelto  Home Medications   Prior to Admission medications   Medication Sig Start Date End Date Taking? Authorizing Provider  Alum & Mag Hydroxide-Simeth (MAGIC MOUTHWASH) SOLN Take 5 mLs by mouth 4 (four) times daily as needed for mouth pain. 11/19/14  Yes Ladell Pier, MD  diphenoxylate-atropine (LOMOTIL) 2.5-0.025 MG per tablet Take 2 tablets by mouth 4 (four) times daily as needed for diarrhea or loose stools. 10/15/14  Yes Owens Shark, NP  ondansetron (ZOFRAN) 4 MG tablet Take 1 tablet (4 mg total) by mouth every 6 (six) hours. 07/10/14  Yes Dorie Rank, MD  oxyCODONE (ROXICODONE) 5 MG immediate release tablet Take 1 -2 tabs PO Q 4-6 hours PRN pain. Patient taking differently: Take 5-10 mg by mouth. PO Q 4-6 hours PRN pain. 12/17/14  Yes Owens Shark, NP  prochlorperazine (COMPAZINE) 10 MG tablet Take 1 tablet (10 mg total) by mouth every 6 (six) hours as needed for nausea or vomiting. 09/03/14  Yes Susanne Borders, NP  sertraline (ZOLOFT) 100 MG tablet Take 1 tablet (100 mg total) by mouth daily. 05/17/14  Yes Josalyn Funches, MD  warfarin (COUMADIN) 4 MG tablet Take as directed by coumadin clinic Patient taking differently: Take 4-6 mg by mouth See admin instructions. 4 mg Sunday Monday Wednsday Friday 6 mg Tuesday Thursday Saturday 11/26/14  Yes Owens Shark, NP  potassium chloride SA (K-DUR,KLOR-CON) 20 MEQ tablet Take 1 tablet (20 mEq total) by mouth daily. Patient not taking:  Reported on 01/08/2015 12/31/14   Owens Shark, NP  [facility] [place]    Historical Provider, MD   BP 162/93 mmHg  Pulse 90  Temp(Src) 98.4 F (36.9 C) (Oral)  Resp 20  SpO2 98% Physical Exam  Constitutional: She is oriented to person, place, and time. She appears well-developed and well-nourished.  HENT:  Head: Normocephalic and atraumatic.  Eyes: Conjunctivae and EOM are normal. Pupils are equal, round, and reactive to light.  Neck: Normal range of motion.  Neck supple.  Cardiovascular: Normal rate and regular rhythm.  Exam reveals no gallop and no friction rub.   No murmur heard. Pulmonary/Chest: Effort normal and breath sounds normal. No respiratory distress. She has no wheezes. She has no rales. She exhibits no tenderness.  Abdominal: Soft. Bowel sounds are normal. She exhibits no distension and no mass. There is tenderness. There is no rebound and no guarding.  Left upper quadrant and right upper quadrant abdominal pain, very tender to palpation, no other focal abdominal tenderness  Musculoskeletal: Normal range of motion. She exhibits no edema or tenderness.  Neurological: She is alert and oriented to person, place, and time.  Skin: Skin is warm and dry.  Psychiatric: She has a normal mood and affect. Her behavior is normal. Judgment and thought content normal.  Nursing note and vitals reviewed.   ED Course  Procedures (including critical care time) Results for orders placed or performed during the hospital encounter of 01/08/15  Comprehensive metabolic panel  Result Value Ref Range   Sodium 137 135 - 145 mmol/L   Potassium 3.1 (L) 3.5 - 5.1 mmol/L   Chloride 100 (L) 101 - 111 mmol/L   CO2 28 22 - 32 mmol/L   Glucose, Bld 112 (H) 65 - 99 mg/dL   BUN 9 6 - 20 mg/dL   Creatinine, Ser 0.55 0.44 - 1.00 mg/dL   Calcium 8.6 (L) 8.9 - 10.3 mg/dL   Total Protein 6.3 (L) 6.5 - 8.1 g/dL   Albumin 3.6 3.5 - 5.0 g/dL   AST 15 15 - 41 U/L   ALT 9 (L) 14 - 54 U/L   Alkaline Phosphatase 90 38 - 126 U/L   Total Bilirubin 0.9 0.3 - 1.2 mg/dL   GFR calc non Af Amer >60 >60 mL/min   GFR calc Af Amer >60 >60 mL/min   Anion gap 9 5 - 15  CBC with Differential  Result Value Ref Range   WBC 7.0 4.0 - 10.5 K/uL   RBC 3.37 (L) 3.87 - 5.11 MIL/uL   Hemoglobin 9.9 (L) 12.0 - 15.0 g/dL   HCT 31.8 (L) 36.0 - 46.0 %   MCV 94.4 78.0 - 100.0 fL   MCH 29.4 26.0 - 34.0 pg   MCHC 31.1 30.0 - 36.0 g/dL   RDW 17.7 (H) 11.5 - 15.5 %   Platelets 287 150 -  400 K/uL   Neutrophils Relative % 69 43 - 77 %   Neutro Abs 4.8 1.7 - 7.7 K/uL   Lymphocytes Relative 20 12 - 46 %   Lymphs Abs 1.4 0.7 - 4.0 K/uL   Monocytes Relative 11 3 - 12 %   Monocytes Absolute 0.8 0.1 - 1.0 K/uL   Eosinophils Relative 0 0 - 5 %   Eosinophils Absolute 0.0 0.0 - 0.7 K/uL   Basophils Relative 0 0 - 1 %   Basophils Absolute 0.0 0.0 - 0.1 K/uL  Lipase, blood  Result Value Ref Range   Lipase  11 (L) 22 - 51 U/L  Urinalysis, Routine w reflex microscopic  Result Value Ref Range   Color, Urine AMBER (A) YELLOW   APPearance CLOUDY (A) CLEAR   Specific Gravity, Urine 1.022 1.005 - 1.030   pH 6.0 5.0 - 8.0   Glucose, UA NEGATIVE NEGATIVE mg/dL   Hgb urine dipstick NEGATIVE NEGATIVE   Bilirubin Urine SMALL (A) NEGATIVE   Ketones, ur NEGATIVE NEGATIVE mg/dL   Protein, ur NEGATIVE NEGATIVE mg/dL   Urobilinogen, UA 1.0 0.0 - 1.0 mg/dL   Nitrite NEGATIVE NEGATIVE   Leukocytes, UA TRACE (A) NEGATIVE  Urine microscopic-add on  Result Value Ref Range   Squamous Epithelial / LPF FEW (A) RARE   WBC, UA 0-2 <3 WBC/hpf   RBC / HPF 0-2 <3 RBC/hpf   Urine-Other MUCOUS PRESENT   I-Stat CG4 Lactic Acid, ED  Result Value Ref Range   Lactic Acid, Venous 0.46 (L) 0.5 - 2.0 mmol/L  I-Stat CG4 Lactic Acid, ED  Result Value Ref Range   Lactic Acid, Venous 0.36 (L) 0.5 - 2.0 mmol/L   Ct Abdomen Pelvis W Contrast  01/08/2015   CLINICAL DATA:  Abdominal pain and diarrhea. History of adenocarcinoma the small intestine.  EXAM: CT ABDOMEN AND PELVIS WITH CONTRAST  TECHNIQUE: Multidetector CT imaging of the abdomen and pelvis was performed using the standard protocol following bolus administration of intravenous contrast.  CONTRAST:  173mL OMNIPAQUE IOHEXOL 300 MG/ML  SOLN  COMPARISON:  CT abdomen pelvis - 12/05/2014; 10/14/2014 ; CT the chest, abdomen pelvis - 07/22/2014  FINDINGS: Postsurgical change of the mid small bowel located within the midline of the lower pelvis. There is moderate  upstream gas and fluid distention of the small bowel with apparent transition point within the mid abdomen adjacent to the operative site (axial image 58, series 2, coronal image 29, series 3) with subsequent decompression of the distal small bowel and colon, findings compatible with small bowel obstruction.  Interval development of a small amount of intra-abdominal ascites with minimal amount of free fluid within the root of the abdominal mesenteric (51, series 2) without definable/drainable fluid collection. No pneumoperitoneum, pneumatosis or portal venous gas.  Scattered shotty retroperitoneal lymph nodes are numerous though individually not enlarged by size criteria with index left-sided periaortic lymph node measuring 0.9 cm in greatest short axis diameter (image 19, series 7). Omental caking about the cranial aspect of the greater omentum is grossly unchanged with index omental nodule measuring approximately 1 cm in greatest short axis diameter (Image 9, series 7).  Normal hepatic contour. The gallbladder is under distended and thus suboptimally evaluated. Borderline enlarged caliber the common bile duct measuring 9 mm in diameter. There is mild periportal edema. The the portal vein appears patent .  There is symmetric enhancement and excretion of the bilateral kidneys. Interval development of mild right-sided pelvicaliectasis and ureterectasis, the etiology of which is not depicted and as the examination though appears to be centered at the location of the pelvic brim. No definite renal stones. No left-sided urinary obstruction. No renal lesions.  Normal appearance of the bilateral adrenal glands, pancreas and spleen.  Limited visualization of lower thorax demonstrates minimal linear subsegmental atelectasis within the imaged right lower lobe. No discrete focal airspace opacities. No pleural effusion.  Borderline cardiomegaly.  No pericardial effusion.  Sclerotic approximately 1.4 x 1.3 cm osseous metastasis  within the right ilium is grossly unchanged (image 70, series 2).  Nodular thickening about the caudal aspect of the midline ventral  abdominal wall surgical scars grossly unchanged (representative image 54, series 2).  IMPRESSION: 1. Findings worrisome for distal small bowel obstruction with transition point regional to the small bowel enteric anastomosis and thus presumably secondary to adhesions. No evidence of perforation or definable/drainable fluid collection. 2. Interval development of mild right-sided pelviectasis and ureterectasis with the right ureter dilated to the level of the pelvic brim - the exact etiology of this obstruction is not depicted on this examination (specifically, no obstructing right-sided ureteral stone is identified) and in the setting of bilateral pelvic sidewall adenectomy, this apparent right-sided ureteral obstruction could be secondary to developing adhesions. 3. Interval development of a small amount of intra-abdominal ascites, presumably reactive in etiology though in the setting of grossly unchanged peritoneal carcinomatosis, malignant ascites is not excluded. 4. Unchanged approximately 1.4 cm sclerotic osseous metastasis within the right ilium.   Electronically Signed   By: Sandi Mariscal M.D.   On: 01/08/2015 22:50      EKG Interpretation None      MDM   Final diagnoses:  Abdominal pain  SBO (small bowel obstruction)   Patient with stage IV metastatic gastric cancer here with abdominal pain, nausea, and vomiting. We'll treat pain, give fluids, consider CT scan to rule out obstruction given persistent vomiting, diarrhea, and intense pain.  CT scan remarkable for distal small bowel obstruction, as well as some ascites. I discussed patient with Dr. Eulis Foster, who recommends surgery consultation. Discussed patient with Dr. Marcello Moores of general surgery, who will consult on the patient the morning. She recommends admission by the hospitalist service secondary to patient's  complicated medical history. Discussed patient with Dr. Marvis Repress, who is greatly appreciated for admitting the patient. Patient understands and agrees with the plan. Pain is currently controlled.  Montine Circle, PA-C 01/09/15 0101  Daleen Bo, MD 01/10/15 [phone]

## 2015-01-08 NOTE — ED Notes (Signed)
Pt with n/v since yesterday.  Abdominal pain/ diarrhea.  No fever.

## 2015-01-08 NOTE — Consult Note (Signed)
Reason for Consult:SBO Referring Physician: Dr Freddie Apley is an 54 y.o. female.  HPI: 54 y.o. F with metastatic gastric cancer who presents to the ED with 24-48h of nausea, vomiting and abd pain.  Pt s/p bilateral oophorectomy, hysterectomy, omentectomy and debulking in March 2015.  She is actively receiving chemotherapy under Dr Benay Spice (Taxol and ramucirumab).  She is also on anticoagulation via coumadin for a PE.     Past Medical History  Diagnosis Date  . 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 Abdominal hysterectomy  10/18/2013     Family History  Problem Relation Age of Onset  . Heart disease Mother   . Diabetes Sister   . Heart disease Sister   . Heart disease Brother     Social History:  reports that she has never smoked. She has never used smokeless tobacco. She reports that she does not drink alcohol or use illicit drugs.  Allergies:  Allergies  Allergen Reactions  . Xarelto [Rivaroxaban] Anaphylaxis, Nausea And Vomiting and Palpitations    Headache and fatigue     Medications: I have reviewed the patient's current medications.  Results for orders placed or performed during the hospital encounter of 01/08/15 (from the past 48 hour(s))  Comprehensive metabolic panel     Status: Abnormal   Collection Time: 01/08/15  7:02 PM  Result Value Ref Range   Sodium 137 135 - 145 mmol/L   Potassium 3.1 (L) 3.5 - 5.1 mmol/L   Chloride 100 (L) 101 - 111 mmol/L   CO2 28 22 - 32 mmol/L   Glucose, Bld 112 (H) 65 - 99 mg/dL   BUN 9 6 - 20 mg/dL   Creatinine, Ser 0.55 0.44 - 1.00 mg/dL   Calcium 8.6 (L) 8.9 - 10.3 mg/dL   Total Protein 6.3 (L) 6.5 - 8.1 g/dL   Albumin 3.6 3.5 - 5.0 g/dL   AST 15 15 - 41 U/L   ALT 9 (L) 14 - 54 U/L   Alkaline Phosphatase 90 38 - 126 U/L   Total Bilirubin 0.9 0.3 - 1.2 mg/dL   GFR calc non Af Amer >60 >60 mL/min   GFR calc Af Amer >60 >60 mL/min    Comment:  (NOTE) The eGFR has been calculated using the CKD EPI equation. This calculation has not been validated in all clinical situations. eGFR's persistently <60 mL/min signify possible Chronic Kidney Disease.    Anion gap 9 5 - 15  CBC with Differential     Status: Abnormal   Collection Time: 01/08/15  7:02 PM  Result Value Ref Range   WBC 7.0 4.0 - 10.5 K/uL   RBC 3.37 (L) 3.87 - 5.11 MIL/uL   Hemoglobin 9.9 (L) 12.0 - 15.0 g/dL   HCT 31.8 (L) 36.0 - 46.0 %   MCV 94.4 78.0 - 100.0 fL   MCH 29.4 26.0 - 34.0 pg   MCHC 31.1 30.0 - 36.0 g/dL   RDW 17.7 (H) 11.5 - 15.5 %   Platelets 287 150 - 400 K/uL   Neutrophils Relative % 69 43 - 77 %   Neutro Abs 4.8 1.7 - 7.7 K/uL   Lymphocytes Relative 20 12 - 46 %   Lymphs Abs 1.4 0.7 - 4.0 K/uL   Monocytes Relative 11 3 - 12 %   Monocytes Absolute 0.8 0.1 - 1.0 K/uL   Eosinophils Relative 0 0 - 5 %  Eosinophils Absolute 0.0 0.0 - 0.7 K/uL   Basophils Relative 0 0 - 1 %   Basophils Absolute 0.0 0.0 - 0.1 K/uL  Lipase, blood     Status: Abnormal   Collection Time: 01/08/15  7:02 PM  Result Value Ref Range   Lipase 11 (L) 22 - 51 U/L  I-Stat CG4 Lactic Acid, ED     Status: Abnormal   Collection Time: 01/08/15  8:39 PM  Result Value Ref Range   Lactic Acid, Venous 0.46 (L) 0.5 - 2.0 mmol/L  Urinalysis, Routine w reflex microscopic     Status: Abnormal   Collection Time: 01/08/15  8:55 PM  Result Value Ref Range   Color, Urine AMBER (A) YELLOW    Comment: BIOCHEMICALS MAY BE AFFECTED BY COLOR   APPearance CLOUDY (A) CLEAR   Specific Gravity, Urine 1.022 1.005 - 1.030   pH 6.0 5.0 - 8.0   Glucose, UA NEGATIVE NEGATIVE mg/dL   Hgb urine dipstick NEGATIVE NEGATIVE   Bilirubin Urine SMALL (A) NEGATIVE   Ketones, ur NEGATIVE NEGATIVE mg/dL   Protein, ur NEGATIVE NEGATIVE mg/dL   Urobilinogen, UA 1.0 0.0 - 1.0 mg/dL   Nitrite NEGATIVE NEGATIVE   Leukocytes, UA TRACE (A) NEGATIVE  Urine microscopic-add on     Status: Abnormal   Collection  Time: 01/08/15  8:55 PM  Result Value Ref Range   Squamous Epithelial / LPF FEW (A) RARE   WBC, UA 0-2 <3 WBC/hpf   RBC / HPF 0-2 <3 RBC/hpf   Urine-Other MUCOUS PRESENT   I-Stat CG4 Lactic Acid, ED     Status: Abnormal   Collection Time: 01/08/15 11:19 PM  Result Value Ref Range   Lactic Acid, Venous 0.36 (L) 0.5 - 2.0 mmol/L    Ct Abdomen Pelvis W Contrast  01/08/2015   CLINICAL DATA:  Abdominal pain and diarrhea. History of adenocarcinoma the small intestine.  EXAM: CT ABDOMEN AND PELVIS WITH CONTRAST  TECHNIQUE: Multidetector CT imaging of the abdomen and pelvis was performed using the standard protocol following bolus administration of intravenous contrast.  CONTRAST:  153m OMNIPAQUE IOHEXOL 300 MG/ML  SOLN  COMPARISON:  CT abdomen pelvis - 12/05/2014; 10/14/2014 ; CT the chest, abdomen pelvis - 07/22/2014  FINDINGS: Postsurgical change of the mid small bowel located within the midline of the lower pelvis. There is moderate upstream gas and fluid distention of the small bowel with apparent transition point within the mid abdomen adjacent to the operative site (axial image 58, series 2, coronal image 29, series 3) with subsequent decompression of the distal small bowel and colon, findings compatible with small bowel obstruction.  Interval development of a small amount of intra-abdominal ascites with minimal amount of free fluid within the root of the abdominal mesenteric (51, series 2) without definable/drainable fluid collection. No pneumoperitoneum, pneumatosis or portal venous gas.  Scattered shotty retroperitoneal lymph nodes are numerous though individually not enlarged by size criteria with index left-sided periaortic lymph node measuring 0.9 cm in greatest short axis diameter (image 19, series 7). Omental caking about the cranial aspect of the greater omentum is grossly unchanged with index omental nodule measuring approximately 1 cm in greatest short axis diameter (Image 9, series 7).   Normal hepatic contour. The gallbladder is under distended and thus suboptimally evaluated. Borderline enlarged caliber the common bile duct measuring 9 mm in diameter. There is mild periportal edema. The the portal vein appears patent .  There is symmetric enhancement and excretion of the bilateral kidneys.  Interval development of mild right-sided pelvicaliectasis and ureterectasis, the etiology of which is not depicted and as the examination though appears to be centered at the location of the pelvic brim. No definite renal stones. No left-sided urinary obstruction. No renal lesions.  Normal appearance of the bilateral adrenal glands, pancreas and spleen.  Limited visualization of lower thorax demonstrates minimal linear subsegmental atelectasis within the imaged right lower lobe. No discrete focal airspace opacities. No pleural effusion.  Borderline cardiomegaly.  No pericardial effusion.  Sclerotic approximately 1.4 x 1.3 cm osseous metastasis within the right ilium is grossly unchanged (image 70, series 2).  Nodular thickening about the caudal aspect of the midline ventral abdominal wall surgical scars grossly unchanged (representative image 54, series 2).  IMPRESSION: 1. Findings worrisome for distal small bowel obstruction with transition point regional to the small bowel enteric anastomosis and thus presumably secondary to adhesions. No evidence of perforation or definable/drainable fluid collection. 2. Interval development of mild right-sided pelviectasis and ureterectasis with the right ureter dilated to the level of the pelvic brim - the exact etiology of this obstruction is not depicted on this examination (specifically, no obstructing right-sided ureteral stone is identified) and in the setting of bilateral pelvic sidewall adenectomy, this apparent right-sided ureteral obstruction could be secondary to developing adhesions. 3. Interval development of a small amount of intra-abdominal ascites, presumably  reactive in etiology though in the setting of grossly unchanged peritoneal carcinomatosis, malignant ascites is not excluded. 4. Unchanged approximately 1.4 cm sclerotic osseous metastasis within the right ilium.   Electronically Signed   By: Sandi Mariscal M.D.   On: 01/08/2015 22:50    Review of Systems  Constitutional: Negative for fever and chills.  Eyes: Negative for blurred vision.  Respiratory: Negative for cough, sputum production and shortness of breath.   Cardiovascular: Negative for chest pain and palpitations.  Gastrointestinal: Positive for nausea, vomiting and abdominal pain.  Genitourinary: Negative for dysuria, urgency and frequency.  Neurological: Negative for dizziness, focal weakness and headaches.   Blood pressure 130/78, pulse 78, temperature 98.4 F (36.9 C), temperature source Oral, resp. rate 20, SpO2 94 %. Physical Exam  Constitutional: She is oriented to person, place, and time. She appears well-developed and well-nourished. No distress.  HENT:  Head: Normocephalic and atraumatic.  Eyes: Conjunctivae are normal. Pupils are equal, round, and reactive to light.  Neck: Normal range of motion.  Cardiovascular: Normal rate and regular rhythm.   Respiratory: Effort normal and breath sounds normal. No respiratory distress.  GI: Soft. She exhibits distension. There is tenderness. There is no rebound and no guarding.  Musculoskeletal: Normal range of motion.  Neurological: She is alert and oriented to person, place, and time.  Skin: Skin is warm and dry. She is not diaphoretic.    Assessment/Plan: 54 y.o. F with SBO, malignant vs adhesive etiology.  Pt with be admitted to medical team.  Recommend insertion of NG tube. Hold coumadin.  Pt is a very poor surgical candidate.  Hopefully, this will resolve with decompression.     Kerin Cecchi C. 09/21/154, 15:37 PM

## 2015-01-09 ENCOUNTER — Inpatient Hospital Stay (HOSPITAL_COMMUNITY): Payer: Medicaid Other

## 2015-01-09 ENCOUNTER — Encounter (HOSPITAL_COMMUNITY): Payer: Self-pay | Admitting: Internal Medicine

## 2015-01-09 DIAGNOSIS — G893 Neoplasm related pain (acute) (chronic): Secondary | ICD-10-CM | POA: Diagnosis present

## 2015-01-09 DIAGNOSIS — G620 Drug-induced polyneuropathy: Secondary | ICD-10-CM | POA: Diagnosis present

## 2015-01-09 DIAGNOSIS — E876 Hypokalemia: Secondary | ICD-10-CM | POA: Diagnosis not present

## 2015-01-09 DIAGNOSIS — R109 Unspecified abdominal pain: Secondary | ICD-10-CM | POA: Diagnosis present

## 2015-01-09 DIAGNOSIS — C800 Disseminated malignant neoplasm, unspecified: Secondary | ICD-10-CM | POA: Diagnosis present

## 2015-01-09 DIAGNOSIS — N1330 Unspecified hydronephrosis: Secondary | ICD-10-CM | POA: Diagnosis present

## 2015-01-09 DIAGNOSIS — D638 Anemia in other chronic diseases classified elsewhere: Secondary | ICD-10-CM | POA: Diagnosis present

## 2015-01-09 DIAGNOSIS — Z90710 Acquired absence of both cervix and uterus: Secondary | ICD-10-CM | POA: Diagnosis not present

## 2015-01-09 DIAGNOSIS — Z888 Allergy status to other drugs, medicaments and biological substances status: Secondary | ICD-10-CM | POA: Diagnosis not present

## 2015-01-09 DIAGNOSIS — K921 Melena: Secondary | ICD-10-CM | POA: Diagnosis present

## 2015-01-09 DIAGNOSIS — D6481 Anemia due to antineoplastic chemotherapy: Secondary | ICD-10-CM | POA: Diagnosis present

## 2015-01-09 DIAGNOSIS — C7951 Secondary malignant neoplasm of bone: Secondary | ICD-10-CM | POA: Diagnosis present

## 2015-01-09 DIAGNOSIS — Z7901 Long term (current) use of anticoagulants: Secondary | ICD-10-CM | POA: Diagnosis not present

## 2015-01-09 DIAGNOSIS — K56609 Unspecified intestinal obstruction, unspecified as to partial versus complete obstruction: Secondary | ICD-10-CM | POA: Diagnosis present

## 2015-01-09 DIAGNOSIS — E860 Dehydration: Secondary | ICD-10-CM

## 2015-01-09 DIAGNOSIS — C169 Malignant neoplasm of stomach, unspecified: Secondary | ICD-10-CM | POA: Diagnosis present

## 2015-01-09 DIAGNOSIS — T451X5A Adverse effect of antineoplastic and immunosuppressive drugs, initial encounter: Secondary | ICD-10-CM | POA: Diagnosis present

## 2015-01-09 DIAGNOSIS — D701 Agranulocytosis secondary to cancer chemotherapy: Secondary | ICD-10-CM | POA: Diagnosis present

## 2015-01-09 DIAGNOSIS — Z86711 Personal history of pulmonary embolism: Secondary | ICD-10-CM | POA: Diagnosis not present

## 2015-01-09 DIAGNOSIS — K5660 Unspecified intestinal obstruction: Secondary | ICD-10-CM | POA: Diagnosis present

## 2015-01-09 DIAGNOSIS — E440 Moderate protein-calorie malnutrition: Secondary | ICD-10-CM | POA: Diagnosis present

## 2015-01-09 DIAGNOSIS — Z6832 Body mass index (BMI) 32.0-32.9, adult: Secondary | ICD-10-CM | POA: Diagnosis not present

## 2015-01-09 LAB — PROTIME-INR
INR: 2.24 — ABNORMAL HIGH (ref 0.00–1.49)
INR: 2.31 — ABNORMAL HIGH (ref 0.00–1.49)
PROTHROMBIN TIME: 25.1 s — AB (ref 11.6–15.2)
Prothrombin Time: 24.6 seconds — ABNORMAL HIGH (ref 11.6–15.2)

## 2015-01-09 LAB — COMPREHENSIVE METABOLIC PANEL
ALK PHOS: 86 U/L (ref 38–126)
ALT: 11 U/L — ABNORMAL LOW (ref 14–54)
AST: 25 U/L (ref 15–41)
Albumin: 3.1 g/dL — ABNORMAL LOW (ref 3.5–5.0)
Anion gap: 7 (ref 5–15)
BILIRUBIN TOTAL: 0.7 mg/dL (ref 0.3–1.2)
BUN: 10 mg/dL (ref 6–20)
CO2: 30 mmol/L (ref 22–32)
Calcium: 8.1 mg/dL — ABNORMAL LOW (ref 8.9–10.3)
Chloride: 101 mmol/L (ref 101–111)
Creatinine, Ser: 0.57 mg/dL (ref 0.44–1.00)
GFR calc Af Amer: >60 mL/min (ref >60)
GFR calc non Af Amer: >60 mL/min (ref >60)
Glucose, Bld: 109 mg/dL — ABNORMAL HIGH (ref 65–99)
POTASSIUM: 3.4 mmol/L — AB (ref 3.5–5.1)
Sodium: 138 mmol/L (ref 135–145)
Total Protein: 5.5 g/dL — ABNORMAL LOW (ref 6.5–8.1)

## 2015-01-09 LAB — TSH: TSH: 2.072 u[IU]/mL (ref 0.350–4.500)

## 2015-01-09 LAB — MAGNESIUM: MAGNESIUM: 2.3 mg/dL (ref 1.7–2.4)

## 2015-01-09 LAB — CBC
HEMATOCRIT: 27.3 % — AB (ref 36.0–46.0)
HEMOGLOBIN: 8.3 g/dL — AB (ref 12.0–15.0)
MCH: 29.1 pg (ref 26.0–34.0)
MCHC: 30.4 g/dL (ref 30.0–36.0)
MCV: 95.8 fL (ref 78.0–100.0)
PLATELETS: 255 10*3/uL (ref 150–400)
RBC: 2.85 MIL/uL — AB (ref 3.87–5.11)
RDW: 17.8 % — ABNORMAL HIGH (ref 11.5–15.5)
WBC: 6.4 10*3/uL (ref 4.0–10.5)

## 2015-01-09 LAB — PHOSPHORUS: PHOSPHORUS: 5.1 mg/dL — AB (ref 2.5–4.6)

## 2015-01-09 LAB — APTT: APTT: 37 s (ref 24–37)

## 2015-01-09 MED ORDER — MORPHINE SULFATE 4 MG/ML IJ SOLN
4.0000 mg | Freq: Once | INTRAMUSCULAR | Status: AC
  Administered 2015-01-09: 4 mg via INTRAVENOUS
  Filled 2015-01-09: qty 1

## 2015-01-09 MED ORDER — ACETAMINOPHEN 325 MG PO TABS: 650.0000 mg | ORAL_TABLET | Freq: Four times a day (QID) | ORAL | Status: DC | PRN

## 2015-01-09 MED ORDER — SODIUM CHLORIDE 0.9 % IV SOLN
INTRAVENOUS | Status: DC
  Administered 2015-01-09 – 2015-01-15 (×7): via INTRAVENOUS

## 2015-01-09 MED ORDER — ONDANSETRON HCL 4 MG/2ML IJ SOLN
4.0000 mg | Freq: Four times a day (QID) | INTRAMUSCULAR | Status: DC | PRN
  Administered 2015-01-09 – 2015-01-15 (×3): 4 mg via INTRAVENOUS
  Filled 2015-01-09 (×3): qty 2

## 2015-01-09 MED ORDER — ONDANSETRON HCL 4 MG PO TABS
4.0000 mg | ORAL_TABLET | Freq: Four times a day (QID) | ORAL | Status: DC | PRN
  Administered 2015-01-14: 4 mg via ORAL
  Filled 2015-01-09: qty 1

## 2015-01-09 MED ORDER — SODIUM CHLORIDE 0.9 % IV SOLN
INTRAVENOUS | Status: AC
  Administered 2015-01-09: 1000 mL via INTRAVENOUS
  Administered 2015-01-09: 09:00:00 via INTRAVENOUS

## 2015-01-09 MED ORDER — HYDROMORPHONE HCL 1 MG/ML IJ SOLN
1.0000 mg | INTRAMUSCULAR | Status: DC | PRN
  Administered 2015-01-09 – 2015-01-10 (×3): 1 mg via INTRAVENOUS
  Filled 2015-01-09 (×3): qty 1

## 2015-01-09 MED ORDER — ACETAMINOPHEN 650 MG RE SUPP: 650.0000 mg | Freq: Four times a day (QID) | RECTAL | Status: DC | PRN

## 2015-01-09 MED ORDER — POTASSIUM CHLORIDE 10 MEQ/100ML IV SOLN
10.0000 meq | INTRAVENOUS | Status: AC
  Administered 2015-01-09 (×4): 10 meq via INTRAVENOUS
  Filled 2015-01-09 (×4): qty 100

## 2015-01-09 NOTE — Progress Notes (Signed)
Subjective: She is from [place], an [place] is her native language, She can understand some [place].  She has not had any nausea since last PM, she has some chronic pain and what she has now she reports as "equal," to her regular pain.  Objective: Vital signs in last 24 hours: Temp:  [97.8 F (36.6 C)-98.4 F (36.9 C)] 97.8 F (36.6 C) (05/26 0543) Pulse Rate:  [72-90] 72 (05/26 0543) Resp:  [14-20] 14 (05/26 0543) BP: (98-162)/(66-93) 98/66 mmHg (05/26 0543) SpO2:  [94 %-98 %] 98 % (05/26 0543) Weight:  [75.569 kg (166 lb 9.6 oz)] 75.569 kg (166 lb 9.6 oz) (05/26 0213)   NPO Afebrile, BP down some at 5 AM K+ 3.4 INR 2.24 CT scan:  SBO with transition point near SB anastomosis no perforation,mild right-sided pelviectasis and ureterectasis with the right ureter dilated to the level of the pelvic brim, ascites in presences of carcinomatosis, 1.4 cm sclerotic osseous metastasis within the right ilium, unchanged. Intake/Output from previous day: 05/25 0701 - 05/26 0700 In: 472.9 [I.V.:472.9] Out: 0  Intake/Output this shift:    General appearance: alert, cooperative and no distress Resp: clear to auscultation bilaterally GI: soft, a little tender, not very distened, few hyperactive BS, but not much..  Lab Results:   Recent Labs  01/08/15 1902 01/09/15 0437  WBC 7.0 6.4  HGB 9.9* 8.3*  HCT 31.8* 27.3*  PLT 287 255    BMET  Recent Labs  01/08/15 1902 01/09/15 0437  NA 137 138  K 3.1* 3.4*  CL 100* 101  CO2 28 30  GLUCOSE 112* 109*  BUN 9 10  CREATININE 0.55 0.57  CALCIUM 8.6* 8.1*   PT/INR  Recent Labs  01/09/15 0015  LABPROT 24.6*  INR 2.24*     Recent Labs Lab 01/08/15 1902 01/09/15 0437  AST 15 25  ALT 9* 11*  ALKPHOS 90 86  BILITOT 0.9 0.7  PROT 6.3* 5.5*  ALBUMIN 3.6 3.1*     Lipase     Component Value Date/Time   LIPASE 11* 01/08/2015 1902     Studies/Results: Ct Abdomen Pelvis W Contrast  01/08/2015   CLINICAL DATA:   Abdominal pain and diarrhea. History of adenocarcinoma the small intestine.  EXAM: CT ABDOMEN AND PELVIS WITH CONTRAST  TECHNIQUE: Multidetector CT imaging of the abdomen and pelvis was performed using the standard protocol following bolus administration of intravenous contrast.  CONTRAST:  180mL OMNIPAQUE IOHEXOL 300 MG/ML  SOLN  COMPARISON:  CT abdomen pelvis - 12/05/2014; 10/14/2014 ; CT the chest, abdomen pelvis - 07/22/2014  FINDINGS: Postsurgical change of the mid small bowel located within the midline of the lower pelvis. There is moderate upstream gas and fluid distention of the small bowel with apparent transition point within the mid abdomen adjacent to the operative site (axial image 58, series 2, coronal image 29, series 3) with subsequent decompression of the distal small bowel and colon, findings compatible with small bowel obstruction.  Interval development of a small amount of intra-abdominal ascites with minimal amount of free fluid within the root of the abdominal mesenteric (51, series 2) without definable/drainable fluid collection. No pneumoperitoneum, pneumatosis or portal venous gas.  Scattered shotty retroperitoneal lymph nodes are numerous though individually not enlarged by size criteria with index left-sided periaortic lymph node measuring 0.9 cm in greatest short axis diameter (image 19, series 7). Omental caking about the cranial aspect of the greater omentum is grossly unchanged with index omental nodule measuring approximately 1 cm in  greatest short axis diameter (Image 9, series 7).  Normal hepatic contour. The gallbladder is under distended and thus suboptimally evaluated. Borderline enlarged caliber the common bile duct measuring 9 mm in diameter. There is mild periportal edema. The the portal vein appears patent .  There is symmetric enhancement and excretion of the bilateral kidneys. Interval development of mild right-sided pelvicaliectasis and ureterectasis, the etiology of which  is not depicted and as the examination though appears to be centered at the location of the pelvic brim. No definite renal stones. No left-sided urinary obstruction. No renal lesions.  Normal appearance of the bilateral adrenal glands, pancreas and spleen.  Limited visualization of lower thorax demonstrates minimal linear subsegmental atelectasis within the imaged right lower lobe. No discrete focal airspace opacities. No pleural effusion.  Borderline cardiomegaly.  No pericardial effusion.  Sclerotic approximately 1.4 x 1.3 cm osseous metastasis within the right ilium is grossly unchanged (image 70, series 2).  Nodular thickening about the caudal aspect of the midline ventral abdominal wall surgical scars grossly unchanged (representative image 54, series 2).  IMPRESSION: 1. Findings worrisome for distal small bowel obstruction with transition point regional to the small bowel enteric anastomosis and thus presumably secondary to adhesions. No evidence of perforation or definable/drainable fluid collection. 2. Interval development of mild right-sided pelviectasis and ureterectasis with the right ureter dilated to the level of the pelvic brim - the exact etiology of this obstruction is not depicted on this examination (specifically, no obstructing right-sided ureteral stone is identified) and in the setting of bilateral pelvic sidewall adenectomy, this apparent right-sided ureteral obstruction could be secondary to developing adhesions. 3. Interval development of a small amount of intra-abdominal ascites, presumably reactive in etiology though in the setting of grossly unchanged peritoneal carcinomatosis, malignant ascites is not excluded. 4. Unchanged approximately 1.4 cm sclerotic osseous metastasis within the right ilium.   Electronically Signed   By: Sandi Mariscal M.D.   On: 01/08/2015 22:50    Medications:    Assessment/Plan SBO  Metastatic gastric cancer Stage IV.  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  We talked and I explained thru translator app that if she had no vomiting or nausea we would try to leave tube out.  If she has any further nausea or vomiting, or if the film was not better we would insert an NG.     LOS: 0 days    Niv Darley 01/09/2015

## 2015-01-09 NOTE — Progress Notes (Addendum)
ANTICOAGULATION CONSULT NOTE - Follow Up Consult  Pharmacy Consult for Heparin Indication: hx of PE, warfarin bridging  Allergies  Allergen Reactions  . Xarelto [Rivaroxaban] Anaphylaxis, Nausea And Vomiting and Palpitations    Headache and fatigue     Patient Measurements: Height: 5' (152.4 cm) Weight: 166 lb 9.6 oz (75.569 kg) IBW/kg (Calculated) : 45.5 Heparin Dosing Weight: 62 kg  Vital Signs: Temp: 98.2 F (36.8 C) (05/26 1300) Temp Source: Oral (05/26 1300) BP: 120/69 mmHg (05/26 1300) Pulse Rate: 77 (05/26 1300)  Labs:  Recent Labs  01/08/15 1902 01/09/15 0015 01/09/15 0437  HGB 9.9*  --  8.3*  HCT 31.8*  --  27.3*  PLT 287  --  255  LABPROT  --  24.6*  --   INR  --  2.24*  --   CREATININE 0.55  --  0.57    Estimated Creatinine Clearance: 72.1 mL/min (by C-G formula based on Cr of 0.57).   Assessment: [name] on chronic warfarin therapy PTA for history of PE admitted for SBO.  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  Plan is for medical management for now per surgery.  Holding warfarin to allow INR to normalize in case surgery becomes necessary and will start heparin for bridge once INR<2.  Hgb low but stable.   Goal of Therapy:  Heparin level 0.3-0.7 units/ml Monitor platelets by anticoagulation protocol: Yes   Plan:  Waiting on INR to normalize.  Repeat INR this afternoon.  Will obtain baseline aPTT at that time as well.  As soon as INR<2, begin heparin infusion at 1000 units/hr (10 ml/hr) without bolus.  Alicia [name] 01/09/2015,1:30 PM   Addendum: 01/09/2015 2:39 PM INR this afternoon = 2.31, remains therapeutic and increased slightly from the level drawn at midnight Plan: F/u INR in AM.  Will not start  heparin today.  Alicia [name], PharmD, BCPS Pager: [phone] 01/09/2015 2:41 PM

## 2015-01-09 NOTE — Progress Notes (Signed)
ANTICOAGULATION CONSULT NOTE - Initial Consult  Pharmacy Consult for Heparin Indication: hx of PE, warfarin bridging  Allergies  Allergen Reactions  . Xarelto [Rivaroxaban] Anaphylaxis, Nausea And Vomiting and Palpitations    Headache and fatigue     Patient Measurements: Height: 5' (152.4 cm) Weight: 166 lb 9.6 oz (75.569 kg) IBW/kg (Calculated) : 45.5 Heparin Dosing Weight:   Vital Signs: Temp: 98 F (36.7 C) (05/26 0213) Temp Source: Oral (05/26 0213) BP: 120/76 mmHg (05/26 0213) Pulse Rate: 74 (05/26 0213)  Labs:  Recent Labs  01/08/15 1902 01/09/15 0015 01/09/15 0437  HGB 9.9*  --  8.3*  HCT 31.8*  --  27.3*  PLT 287  --  255  LABPROT  --  24.6*  --   INR  --  2.24*  --   CREATININE 0.55  --   --     Estimated Creatinine Clearance: 72.1 mL/min (by C-G formula based on Cr of 0.55).   Medical History: Past Medical History  Diagnosis Date  . Cancer Dx Mar 5,2015    adenocarcinoma small intestine    Medications:  Prescriptions prior to admission  Medication Sig Dispense Refill Last Dose  . Alum & Mag Hydroxide-Simeth (MAGIC MOUTHWASH) SOLN Take 5 mLs by mouth 4 (four) times daily as needed for mouth pain. 300 mL 1 unknown at Unknown time  . diphenoxylate-atropine (LOMOTIL) 2.5-0.025 MG per tablet Take 2 tablets by mouth 4 (four) times daily as needed for diarrhea or loose stools. 60 tablet 2 Past Week at Unknown time  . ondansetron (ZOFRAN) 4 MG tablet Take 1 tablet (4 mg total) by mouth every 6 (six) hours. 30 tablet 0 01/07/2015 at Unknown time  . oxyCODONE (ROXICODONE) 5 MG immediate release tablet Take 1 -2 tabs PO Q 4-6 hours PRN pain. (Patient taking differently: Take 5-10 mg by mouth. PO Q 4-6 hours PRN pain.) 60 tablet 0 01/07/2015 at Unknown time  . prochlorperazine (COMPAZINE) 10 MG tablet Take 1 tablet (10 mg total) by mouth every 6 (six) hours as needed for nausea or vomiting. 30 tablet 2 01/07/2015 at Unknown time  . sertraline (ZOLOFT) 100 MG  tablet Take 1 tablet (100 mg total) by mouth daily. 30 tablet 2 01/07/2015 at Unknown time  . warfarin (COUMADIN) 4 MG tablet Take as directed by coumadin clinic (Patient taking differently: Take 4-6 mg by mouth See admin instructions. 4 mg Sunday Monday Wednsday Friday 6 mg Tuesday Thursday Saturday) 60 tablet 1 01/07/2015 at afternoon  . potassium chloride SA (K-DUR,KLOR-CON) 20 MEQ tablet Take 1 tablet (20 mEq total) by mouth daily. (Patient not taking: Reported on 01/08/2015) 30 tablet 2   . PRESCRIPTION MEDICATION [facility]   12/31/2014    Assessment: Patient with chronic warfarin for hx of PE.  Patient with SBO and INR > 2 on admit.  Pharmacy to dose heparin when INR < 2.    Goal of Therapy:  Heparin level 0.3-0.7 units/ml Monitor platelets by anticoagulation protocol: Yes   Plan:  Recheck INR at 1400. Dose heparin (without bolus) as needed based on 1400 INR  Nani Skillern Crowford 01/09/2015,5:12 AM

## 2015-01-09 NOTE — H&P (Signed)
PCP: Minerva Ends, MD not been seeing recently Oncology Dr. Benay Spice   Referring provider Lorre Munroe   Chief Complaint: Abdominal pain nausea and vomiting  HPI: Alicia [name] is a 54 y.o. female   has a past medical history of Cancer (Dx Mar 5,2015).   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 She reports vomiting some brown substance no blood. Appear fecal color. She reports little "sprinkle" of blood in stool.  In emergency department CT scan of the abdomen was done showing small bowel obstruction with transition point worrisome for adhesions. Also noted right ureteral dilatation and also development of intra-abdominal ascites. Emergency department provider has contacted general surgery for consult spoke to Dr. Marcello Moores recommended in June tube insertion holding Coumadin if possible and conservative management. Of note patient history of extensive intra-abdominal surgery including bilateral oophorectomy total abdominal hysterectomy and omentectomy resection of abdominal wall pelvic masses. Patient has history of PE for which she is on Coumadin.   Patient reports history of blood in stool but states that that is to be expected according to her oncologist. I expect that small monitor of blood could be explained. Patient denies any significant blood per rectum. She states currently her nausea vomiting has improved as well as her abdominal pain. She patient is agreeable to hold off on NG tube for now.   Hospitalist was called for admission for SBO  Review of Systems:    Pertinent positives include:  abdominal pain, nausea, vomiting, diarrhea, blood in stool,  Constitutional:  No weight loss, night sweats, Fevers, chills, fatigue, weight loss  HEENT:  No headaches, Difficulty swallowing,Tooth/dental problems,Sore throat,  No sneezing,  itching, ear ache, nasal congestion, post nasal drip,  Cardio-vascular:  No chest pain, Orthopnea, PND, anasarca, dizziness, palpitations.no Bilateral lower extremity swelling  GI:  No heartburn, indigestion,  change in bowel habits, loss of appetite, melena,  hematemesis Resp:  no shortness of breath at rest. No dyspnea on exertion, No excess mucus, no productive cough, No non-productive cough, No coughing up of blood.No change in color of mucus.No wheezing. Skin:  no rash or lesions. No jaundice GU:  no dysuria, change in color of urine, no urgency or frequency. No straining to urinate.  No flank pain.  Musculoskeletal:  No joint pain or no joint swelling. No decreased range of motion. No back pain.  Psych:  No change in mood or affect. No depression or anxiety. No memory loss.  Neuro: no localizing neurological complaints, no tingling, no weakness, no double vision, no gait abnormality, no slurred speech, no confusion  Otherwise ROS are negative except for above, 10 systems were reviewed  Past Medical History: Past Medical History  Diagnosis Date  . 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 Abdominal hysterectomy  10/18/2013      Medications: Prior to Admission medications   Medication Sig Start Date End Date Taking? Authorizing Provider  Alum & Mag Hydroxide-Simeth (MAGIC MOUTHWASH) SOLN Take 5 mLs by mouth 4 (four) times daily as needed for mouth pain. 11/19/14  Yes Ladell Pier, MD  diphenoxylate-atropine (LOMOTIL) 2.5-0.025 MG per tablet Take 2 tablets by mouth 4 (four) times daily as needed for diarrhea or loose stools. 10/15/14  Yes Owens Shark, NP  ondansetron (ZOFRAN) 4 MG tablet Take 1 tablet (  4 mg total) by mouth every 6 (six) hours. 07/10/14  Yes Dorie Rank, MD  oxyCODONE (ROXICODONE) 5 MG immediate release tablet Take 1 -2 tabs PO Q 4-6 hours PRN pain. Patient taking differently: Take 5-10 mg by  mouth. PO Q 4-6 hours PRN pain. 12/17/14  Yes Owens Shark, NP  prochlorperazine (COMPAZINE) 10 MG tablet Take 1 tablet (10 mg total) by mouth every 6 (six) hours as needed for nausea or vomiting. 09/03/14  Yes Susanne Borders, NP  sertraline (ZOLOFT) 100 MG tablet Take 1 tablet (100 mg total) by mouth daily. 05/17/14  Yes Josalyn Funches, MD  warfarin (COUMADIN) 4 MG tablet Take as directed by coumadin clinic Patient taking differently: Take 4-6 mg by mouth See admin instructions. 4 mg Sunday Monday Wednsday Friday 6 mg Tuesday Thursday Saturday 11/26/14  Yes Owens Shark, NP  potassium chloride SA (K-DUR,KLOR-CON) 20 MEQ tablet Take 1 tablet (20 mEq total) by mouth daily. Patient not taking: Reported on 01/08/2015 12/31/14   Owens Shark, NP  [place]    Historical Provider, MD    Allergies:   Allergies  Allergen Reactions  . Xarelto [Rivaroxaban] Anaphylaxis, Nausea And Vomiting and Palpitations    Headache and fatigue     Social History:  Ambulatory  independently   Lives at home   With family     reports that she has never smoked. She has never used smokeless tobacco. She reports that she does not drink alcohol or use illicit drugs.    Family History: family history includes Diabetes in her sister; Heart disease in her brother, mother, and sister.    Physical Exam: Patient Vitals for the past 24 hrs:  BP Temp Temp src Pulse Resp SpO2  01/08/15 2231 130/78 mmHg - - 78 20 94 %  01/08/15 2036 138/85 mmHg - - 88 20 96 %  01/08/15 1901 162/93 mmHg 98.4 F (36.9 C) Oral 90 20 98 %    1. General:  in No Acute distress 2. Psychological: Alert and   Oriented 3. Head/ENT:    Dry Mucous Membranes                          Head Non traumatic, neck supple                          Normal   Dentition 4. SKIN:  decreased Skin turgor,  Skin clean Dry and intact no rash 5. Heart: Regular rate and rhythm no Murmur, Rub or gallop 6. Lungs: Clear to auscultation  bilaterally, no wheezes or crackles   7. Abdomen: Soft, some tenderness, slightly distended 8. Lower extremities: no clubbing, cyanosis, or edema 9. Neurologically Grossly intact, moving all 4 extremities equally 10. MSK: Normal range of motion  body mass index is unknown because there is no weight on file.   Labs on Admission:   Results for orders placed or performed during the hospital encounter of 01/08/15 (from the past 24 hour(s))  Comprehensive metabolic panel     Status: Abnormal   Collection Time: 01/08/15  7:02 PM  Result Value Ref Range   Sodium 137 135 - 145 mmol/L   Potassium 3.1 (L) 3.5 - 5.1 mmol/L   Chloride 100 (L) 101 - 111 mmol/L   CO2 28 22 - 32 mmol/L   Glucose, Bld 112 (H) 65 - 99 mg/dL   BUN 9 6 -  20 mg/dL   Creatinine, Ser 0.55 0.44 - 1.00 mg/dL   Calcium 8.6 (L) 8.9 - 10.3 mg/dL   Total Protein 6.3 (L) 6.5 - 8.1 g/dL   Albumin 3.6 3.5 - 5.0 g/dL   AST 15 15 - 41 U/L   ALT 9 (L) 14 - 54 U/L   Alkaline Phosphatase 90 38 - 126 U/L   Total Bilirubin 0.9 0.3 - 1.2 mg/dL   GFR calc non Af Amer >60 >60 mL/min   GFR calc Af Amer >60 >60 mL/min   Anion gap 9 5 - 15  CBC with Differential     Status: Abnormal   Collection Time: 01/08/15  7:02 PM  Result Value Ref Range   WBC 7.0 4.0 - 10.5 K/uL   RBC 3.37 (L) 3.87 - 5.11 MIL/uL   Hemoglobin 9.9 (L) 12.0 - 15.0 g/dL   HCT 31.8 (L) 36.0 - 46.0 %   MCV 94.4 78.0 - 100.0 fL   MCH 29.4 26.0 - 34.0 pg   MCHC 31.1 30.0 - 36.0 g/dL   RDW 17.7 (H) 11.5 - 15.5 %   Platelets 287 150 - 400 K/uL   Neutrophils Relative % 69 43 - 77 %   Neutro Abs 4.8 1.7 - 7.7 K/uL   Lymphocytes Relative 20 12 - 46 %   Lymphs Abs 1.4 0.7 - 4.0 K/uL   Monocytes Relative 11 3 - 12 %   Monocytes Absolute 0.8 0.1 - 1.0 K/uL   Eosinophils Relative 0 0 - 5 %   Eosinophils Absolute 0.0 0.0 - 0.7 K/uL   Basophils Relative 0 0 - 1 %   Basophils Absolute 0.0 0.0 - 0.1 K/uL  Lipase, blood     Status: Abnormal   Collection Time: 01/08/15   7:02 PM  Result Value Ref Range   Lipase 11 (L) 22 - 51 U/L  I-Stat CG4 Lactic Acid, ED     Status: Abnormal   Collection Time: 01/08/15  8:39 PM  Result Value Ref Range   Lactic Acid, Venous 0.46 (L) 0.5 - 2.0 mmol/L  Urinalysis, Routine w reflex microscopic     Status: Abnormal   Collection Time: 01/08/15  8:55 PM  Result Value Ref Range   Color, Urine AMBER (A) YELLOW   APPearance CLOUDY (A) CLEAR   Specific Gravity, Urine 1.022 1.005 - 1.030   pH 6.0 5.0 - 8.0   Glucose, UA NEGATIVE NEGATIVE mg/dL   Hgb urine dipstick NEGATIVE NEGATIVE   Bilirubin Urine SMALL (A) NEGATIVE   Ketones, ur NEGATIVE NEGATIVE mg/dL   Protein, ur NEGATIVE NEGATIVE mg/dL   Urobilinogen, UA 1.0 0.0 - 1.0 mg/dL   Nitrite NEGATIVE NEGATIVE   Leukocytes, UA TRACE (A) NEGATIVE  Urine microscopic-add on     Status: Abnormal   Collection Time: 01/08/15  8:55 PM  Result Value Ref Range   Squamous Epithelial / LPF FEW (A) RARE   WBC, UA 0-2 <3 WBC/hpf   RBC / HPF 0-2 <3 RBC/hpf   Urine-Other MUCOUS PRESENT   I-Stat CG4 Lactic Acid, ED     Status: Abnormal   Collection Time: 01/08/15 11:19 PM  Result Value Ref Range   Lactic Acid, Venous 0.36 (L) 0.5 - 2.0 mmol/L    UA no evidence of UTI  No results found for: HGBA1C  Estimated Creatinine Clearance: 71.2 mL/min (by C-G formula based on Cr of 0.55).  BNP (last 3 results) No results for input(s): PROBNP in the last 8760 hours.  Other results:  I have pearsonaly reviewed this: ECG REPORT not obtained   There were no vitals filed for this visit.   Cultures: No results found for: SDES, SPECREQUEST, CULT, REPTSTATUS   Radiological Exams on Admission: Ct Abdomen Pelvis W Contrast  01/08/2015   CLINICAL DATA:  Abdominal pain and diarrhea. History of adenocarcinoma the small intestine.  EXAM: CT ABDOMEN AND PELVIS WITH CONTRAST  TECHNIQUE: Multidetector CT imaging of the abdomen and pelvis was performed using the standard protocol following bolus  administration of intravenous contrast.  CONTRAST:  157mL OMNIPAQUE IOHEXOL 300 MG/ML  SOLN  COMPARISON:  CT abdomen pelvis - 12/05/2014; 10/14/2014 ; CT the chest, abdomen pelvis - 07/22/2014  FINDINGS: Postsurgical change of the mid small bowel located within the midline of the lower pelvis. There is moderate upstream gas and fluid distention of the small bowel with apparent transition point within the mid abdomen adjacent to the operative site (axial image 58, series 2, coronal image 29, series 3) with subsequent decompression of the distal small bowel and colon, findings compatible with small bowel obstruction.  Interval development of a small amount of intra-abdominal ascites with minimal amount of free fluid within the root of the abdominal mesenteric (51, series 2) without definable/drainable fluid collection. No pneumoperitoneum, pneumatosis or portal venous gas.  Scattered shotty retroperitoneal lymph nodes are numerous though individually not enlarged by size criteria with index left-sided periaortic lymph node measuring 0.9 cm in greatest short axis diameter (image 19, series 7). Omental caking about the cranial aspect of the greater omentum is grossly unchanged with index omental nodule measuring approximately 1 cm in greatest short axis diameter (Image 9, series 7).  Normal hepatic contour. The gallbladder is under distended and thus suboptimally evaluated. Borderline enlarged caliber the common bile duct measuring 9 mm in diameter. There is mild periportal edema. The the portal vein appears patent .  There is symmetric enhancement and excretion of the bilateral kidneys. Interval development of mild right-sided pelvicaliectasis and ureterectasis, the etiology of which is not depicted and as the examination though appears to be centered at the location of the pelvic brim. No definite renal stones. No left-sided urinary obstruction. No renal lesions.  Normal appearance of the bilateral adrenal glands,  pancreas and spleen.  Limited visualization of lower thorax demonstrates minimal linear subsegmental atelectasis within the imaged right lower lobe. No discrete focal airspace opacities. No pleural effusion.  Borderline cardiomegaly.  No pericardial effusion.  Sclerotic approximately 1.4 x 1.3 cm osseous metastasis within the right ilium is grossly unchanged (image 70, series 2).  Nodular thickening about the caudal aspect of the midline ventral abdominal wall surgical scars grossly unchanged (representative image 54, series 2).  IMPRESSION: 1. Findings worrisome for distal small bowel obstruction with transition point regional to the small bowel enteric anastomosis and thus presumably secondary to adhesions. No evidence of perforation or definable/drainable fluid collection. 2. Interval development of mild right-sided pelviectasis and ureterectasis with the right ureter dilated to the level of the pelvic brim - the exact etiology of this obstruction is not depicted on this examination (specifically, no obstructing right-sided ureteral stone is identified) and in the setting of bilateral pelvic sidewall adenectomy, this apparent right-sided ureteral obstruction could be secondary to developing adhesions. 3. Interval development of a small amount of intra-abdominal ascites, presumably reactive in etiology though in the setting of grossly unchanged peritoneal carcinomatosis, malignant ascites is not excluded. 4. Unchanged approximately 1.4 cm sclerotic osseous metastasis within the right ilium.  Electronically Signed   By: Sandi Mariscal M.D.   On: 01/08/2015 22:50    Chart has been reviewed  Family  at  Bedside  plan of care was discussed with Husband,    Assessment/Plan  54 year old female history of gastric cancer with metastasis to the bone and omentum results of nausea and vomiting was found to have small bowel obstruction. General surgery is aware but feels the patient would be best served by  conservative management at this point. Of note patient has history of pulmonary embolism 1 year ago and currently still Coumadin.   Present on Admission:  . SBO (small bowel obstruction) - complete bowel rest. Surgery is aware. For now continue with conservative management with pain control and bowel rest. Hold Coumadin instead while nothing by mouth Will start on heparin without bolus. This patient still hypercoagulable  . Gastric cancer . - will need to notify oncology as the patient is being admitted . Nausea with vomiting - currently improved. The hold off on NG tube placement since patient is doing better. Evidence of ureteral dilatation with no evidence of obstruction. Worrisome for adhesions. We'll continue to monitor creatinine. This can be suffered discussed with urology in the morning to see if there's any reason for concern  . PE (pulmonary embolis switch to heparin Prophylaxis: Heparin  CODE STATUS:  FULL CODE  as per patient    Disposition: To home once workup is complete and patient is stable  Other plan as per orders.  I have spent a total of 65 min on this admission extra time was taken to discuss case of pharmacy   [facility] 01/09/2015, 12:04 AM  [facility]  Pager [phone]   after 2 AM please page floor coverage PA If 7AM-7PM, please contact the day team taking care of the patient  [email or web address]  Password [email or web address]

## 2015-01-09 NOTE — Progress Notes (Signed)
Initial Nutrition Assessment  DOCUMENTATION CODES:  Obesity unspecified  INTERVENTION:  Diet advancement per MD When diet is advanced, recommend Ensure Enlive po BID, each supplement provides 350 kcal and 20 grams of protein RD to continue to monitor  NUTRITION DIAGNOSIS:  Inadequate oral intake related to inability to eat as evidenced by NPO status.  GOAL:  Patient will meet greater than or equal to 90% of their needs  MONITOR:  Diet advancement, Labs, Weight trends, Skin, I & O's  REASON FOR ASSESSMENT:  Malnutrition Screening Tool    ASSESSMENT: 54 y.o. female has a past medical history of Cancer (Dx Mar 5,2015). 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   Pt in room with husband at bedside. Pt and family does not speak english. RD attempted to contact interpretor through interpretor line, line was busy.  Pt currently NPO for bowel rest d/t SBO. Per previous encounters of [place] RD, pt has been drinking Ensure supplements at home. RD to order. Pt previously diagnosed with severe malnutrition.  Per weight history documentation, pt has lost 30 lb since October 2015 (15% weight loss x 7 months, significant for time frame).  Labs reviewed: Low K Elevated Phos  Height:  Ht Readings from Last 1 Encounters:  01/09/15 5' (1.524 m)    Weight:  Wt Readings from Last 1 Encounters:  01/09/15 166 lb 9.6 oz (75.569 kg)    Ideal Body Weight:  45.5 kg  Wt Readings from Last 10 Encounters:  01/09/15 166 lb 9.6 oz (75.569 kg)  12/31/14 162 lb 12.8 oz (73.846 kg)  12/17/14 165 lb 12.8 oz (75.206 kg)  12/10/14 167 lb (75.751 kg)  12/05/14 172 lb 1.6 oz (78.064 kg)  11/26/14 170 lb 4.8 oz (77.248 kg)  11/12/14 171 lb 9.6 oz (77.837 kg)  10/29/14 173 lb (78.472 kg)  10/15/14 169 lb 9.6 oz (76.93 kg)  10/01/14 175 lb  (79.379 kg)    BMI:  Body mass index is 32.54 kg/(m^2).  Estimated Nutritional Needs:  Kcal:  1700-1900  Protein:  70-80g  Fluid:  1.7L/day   Skin:  Reviewed, no issues  Diet Order:  Diet NPO time specified  EDUCATION NEEDS:  No education needs identified at this time   Intake/Output Summary (Last 24 hours) at 01/09/15 1509 Last data filed at 01/09/15 1241  Gross per 24 hour  Intake 1216.67 ml  Output    400 ml  Net 816.67 ml    Last BM:  PTA  Clayton Bibles, MS, RD, LDN Pager: [phone] After Hours Pager: [phone]

## 2015-01-09 NOTE — Progress Notes (Addendum)
[facility] PROGRESS NOTE  Alicia [name] ZHG:[id number] DOB: 1961/03/03 DOA: 01/08/2015 PCP: Minerva Ends, MD  Same Day Note - Pt Admitted After 12am  Assessment/Plan: 1. SBO 1. General Surgery was consulted. Appreciate input 2. For now, no recs for surgery and instead, cont with conservative measures and supportive care 3. Patient reports feeling somewhat better today but still nauseated 2. Gastric Cancer 1. Followed by Oncology - Dr. Benay Spice 3. Hx PE 1. Coumadin on hold, but pt is continued on heparin gtt given hypercoagulable state 4. DVT prophylaxis 1. On Heparin gtt  Code Status: Full Family Communication: Pt in room (indicate person spoken with, relationship, and if by phone, the number) Disposition Plan: Pending   Consultants:  General Surgery  Procedures:    Antibiotics:   (indicate start date, and stop date if known)  HPI/Subjective: Through translator app, pt states feeling somewhat better today.  Objective: Filed Vitals:   01/09/15 0543 01/09/15 0900 01/09/15 1300 01/09/15 1730  BP: 98/66 99/82 120/69 111/78  Pulse: 72 71 77 74  Temp: 97.8 F (36.6 C) 97.5 F (36.4 C) 98.2 F (36.8 C) 98.2 F (36.8 C)  TempSrc: Oral Oral Oral Oral  Resp: 14 16 16 18   Height:      Weight:      SpO2: 98% 96% 100% 97%    Intake/Output Summary (Last 24 hours) at 01/09/15 1820 Last data filed at 01/09/15 1721  Gross per 24 hour  Intake 1216.67 ml  Output    400 ml  Net 816.67 ml   Filed Weights   01/09/15 0213  Weight: 75.569 kg (166 lb 9.6 oz)    Exam:   General:  Awake, in nad  Cardiovascular: regular, s1, s2  Respiratory: normal resp effort, no wheezing  Abdomen: soft, decreased BS, generally tender  Musculoskeletal: perfused, no clubbing   Data Reviewed: Basic Metabolic Panel:  Recent Labs Lab 01/08/15 1902 01/09/15 0437  NA 137 138  K 3.1* 3.4*  CL 100* 101  CO2 28 30  GLUCOSE 112* 109*  BUN 9 10  CREATININE 0.55  0.57  CALCIUM 8.6* 8.1*  MG  --  2.3  PHOS  --  5.1*   Liver Function Tests:  Recent Labs Lab 01/08/15 1902 01/09/15 0437  AST 15 25  ALT 9* 11*  ALKPHOS 90 86  BILITOT 0.9 0.7  PROT 6.3* 5.5*  ALBUMIN 3.6 3.1*    Recent Labs Lab 01/08/15 1902  LIPASE 11*   No results for input(s): AMMONIA in the last 168 hours. CBC:  Recent Labs Lab 01/08/15 1902 01/09/15 0437  WBC 7.0 6.4  NEUTROABS 4.8  --   HGB 9.9* 8.3*  HCT 31.8* 27.3*  MCV 94.4 95.8  PLT 287 255   Cardiac Enzymes: No results for input(s): CKTOTAL, CKMB, CKMBINDEX, TROPONINI in the last 168 hours. BNP (last 3 results) No results for input(s): BNP in the last 8760 hours.  ProBNP (last 3 results) No results for input(s): PROBNP in the last 8760 hours.  CBG: No results for input(s): GLUCAP in the last 168 hours.  No results found for this or any previous visit (from the past 240 hour(s)).   Studies: Ct Abdomen Pelvis W Contrast  01/08/2015   CLINICAL DATA:  Abdominal pain and diarrhea. History of adenocarcinoma the small intestine.  EXAM: CT ABDOMEN AND PELVIS WITH CONTRAST  TECHNIQUE: Multidetector CT imaging of the abdomen and pelvis was performed using the standard protocol following bolus administration of intravenous contrast.  CONTRAST:  144mL OMNIPAQUE IOHEXOL 300 MG/ML  SOLN  COMPARISON:  CT abdomen pelvis - 12/05/2014; 10/14/2014 ; CT the chest, abdomen pelvis - 07/22/2014  FINDINGS: Postsurgical change of the mid small bowel located within the midline of the lower pelvis. There is moderate upstream gas and fluid distention of the small bowel with apparent transition point within the mid abdomen adjacent to the operative site (axial image 58, series 2, coronal image 29, series 3) with subsequent decompression of the distal small bowel and colon, findings compatible with small bowel obstruction.  Interval development of a small amount of intra-abdominal ascites with minimal amount of free fluid within  the root of the abdominal mesenteric (51, series 2) without definable/drainable fluid collection. No pneumoperitoneum, pneumatosis or portal venous gas.  Scattered shotty retroperitoneal lymph nodes are numerous though individually not enlarged by size criteria with index left-sided periaortic lymph node measuring 0.9 cm in greatest short axis diameter (image 19, series 7). Omental caking about the cranial aspect of the greater omentum is grossly unchanged with index omental nodule measuring approximately 1 cm in greatest short axis diameter (Image 9, series 7).  Normal hepatic contour. The gallbladder is under distended and thus suboptimally evaluated. Borderline enlarged caliber the common bile duct measuring 9 mm in diameter. There is mild periportal edema. The the portal vein appears patent .  There is symmetric enhancement and excretion of the bilateral kidneys. Interval development of mild right-sided pelvicaliectasis and ureterectasis, the etiology of which is not depicted and as the examination though appears to be centered at the location of the pelvic brim. No definite renal stones. No left-sided urinary obstruction. No renal lesions.  Normal appearance of the bilateral adrenal glands, pancreas and spleen.  Limited visualization of lower thorax demonstrates minimal linear subsegmental atelectasis within the imaged right lower lobe. No discrete focal airspace opacities. No pleural effusion.  Borderline cardiomegaly.  No pericardial effusion.  Sclerotic approximately 1.4 x 1.3 cm osseous metastasis within the right ilium is grossly unchanged (image 70, series 2).  Nodular thickening about the caudal aspect of the midline ventral abdominal wall surgical scars grossly unchanged (representative image 54, series 2).  IMPRESSION: 1. Findings worrisome for distal small bowel obstruction with transition point regional to the small bowel enteric anastomosis and thus presumably secondary to adhesions. No evidence of  perforation or definable/drainable fluid collection. 2. Interval development of mild right-sided pelviectasis and ureterectasis with the right ureter dilated to the level of the pelvic brim - the exact etiology of this obstruction is not depicted on this examination (specifically, no obstructing right-sided ureteral stone is identified) and in the setting of bilateral pelvic sidewall adenectomy, this apparent right-sided ureteral obstruction could be secondary to developing adhesions. 3. Interval development of a small amount of intra-abdominal ascites, presumably reactive in etiology though in the setting of grossly unchanged peritoneal carcinomatosis, malignant ascites is not excluded. 4. Unchanged approximately 1.4 cm sclerotic osseous metastasis within the right ilium.   Electronically Signed   By: Sandi Mariscal M.D.   On: 01/08/2015 22:50   Dg Abd 2 Views  01/09/2015   CLINICAL DATA:  Gastric cancer, abdominal pain, nausea and vomiting.  EXAM: ABDOMEN - 2 VIEW  COMPARISON:  CT abdomen/pelvis 01/08/2015  FINDINGS: Multiple differential air-fluid levels are identified, predominantly colon, although a mildly dilated mid abdominal small bowel loop is identified measuring at least 3.3 cm. Retained contrast within moderately dilated right renal collecting system and right ureter. Pelvic clips are noted. No free air.  IMPRESSION:  Multiple air-fluid levels suggesting mid to distal small bowel obstruction as seen previously. Contrast does reach the colon therefore an element of partial obstruction or ileus could appear similar.  Right hydroureteronephrosis reidentified.   Electronically Signed   By: Conchita Paris M.D.   On: 01/09/2015 09:24    Scheduled Meds:  Continuous Infusions: . sodium chloride      Active Problems:   PE (pulmonary embolism)   Anticoagulated on Coumadin   Gastric cancer   Nausea with vomiting   SBO (small bowel obstruction)   Rayshon Albaugh, [facility] Pager [phone].  If 7PM-7AM, please contact night-coverage at [email or web address], password [facility] 01/09/2015, 6:20 PM  LOS: 0 days

## 2015-01-10 ENCOUNTER — Telehealth: Payer: Self-pay | Admitting: Hematology

## 2015-01-10 ENCOUNTER — Other Ambulatory Visit: Payer: Self-pay | Admitting: *Deleted

## 2015-01-10 ENCOUNTER — Inpatient Hospital Stay (HOSPITAL_COMMUNITY): Payer: Medicaid Other

## 2015-01-10 DIAGNOSIS — D701 Agranulocytosis secondary to cancer chemotherapy: Secondary | ICD-10-CM

## 2015-01-10 DIAGNOSIS — R109 Unspecified abdominal pain: Secondary | ICD-10-CM

## 2015-01-10 DIAGNOSIS — R112 Nausea with vomiting, unspecified: Secondary | ICD-10-CM

## 2015-01-10 DIAGNOSIS — Z5181 Encounter for therapeutic drug level monitoring: Secondary | ICD-10-CM

## 2015-01-10 DIAGNOSIS — C169 Malignant neoplasm of stomach, unspecified: Secondary | ICD-10-CM

## 2015-01-10 DIAGNOSIS — G620 Drug-induced polyneuropathy: Secondary | ICD-10-CM

## 2015-01-10 DIAGNOSIS — Z7901 Long term (current) use of anticoagulants: Secondary | ICD-10-CM

## 2015-01-10 DIAGNOSIS — K5660 Unspecified intestinal obstruction: Principal | ICD-10-CM

## 2015-01-10 DIAGNOSIS — D6481 Anemia due to antineoplastic chemotherapy: Secondary | ICD-10-CM

## 2015-01-10 DIAGNOSIS — I2699 Other pulmonary embolism without acute cor pulmonale: Secondary | ICD-10-CM

## 2015-01-10 LAB — CBC WITH DIFFERENTIAL/PLATELET
BASOS ABS: 0 10*3/uL (ref 0.0–0.1)
Basophils Relative: 1 % (ref 0–1)
Eosinophils Absolute: 0.1 10*3/uL (ref 0.0–0.7)
Eosinophils Relative: 2 % (ref 0–5)
HCT: 28.3 % — ABNORMAL LOW (ref 36.0–46.0)
HEMOGLOBIN: 8.5 g/dL — AB (ref 12.0–15.0)
Lymphocytes Relative: 19 % (ref 12–46)
Lymphs Abs: 0.8 10*3/uL (ref 0.7–4.0)
MCH: 29.1 pg (ref 26.0–34.0)
MCHC: 30 g/dL (ref 30.0–36.0)
MCV: 96.9 fL (ref 78.0–100.0)
MONOS PCT: 13 % — AB (ref 3–12)
Monocytes Absolute: 0.6 10*3/uL (ref 0.1–1.0)
NEUTROS ABS: 2.8 10*3/uL (ref 1.7–7.7)
Neutrophils Relative %: 65 % (ref 43–77)
Platelets: 234 10*3/uL (ref 150–400)
RBC: 2.92 MIL/uL — ABNORMAL LOW (ref 3.87–5.11)
RDW: 17.8 % — ABNORMAL HIGH (ref 11.5–15.5)
WBC: 4.2 10*3/uL (ref 4.0–10.5)

## 2015-01-10 LAB — PROTIME-INR
INR: 2.05 — AB (ref 0.00–1.49)
INR: 1.91 — AB (ref 0.00–1.49)
PROTHROMBIN TIME: 21.8 s — AB (ref 11.6–15.2)
Prothrombin Time: 23 seconds — ABNORMAL HIGH (ref 11.6–15.2)

## 2015-01-10 LAB — BASIC METABOLIC PANEL
ANION GAP: 8 (ref 5–15)
BUN: 8 mg/dL (ref 6–20)
CALCIUM: 8.1 mg/dL — AB (ref 8.9–10.3)
CO2: 26 mmol/L (ref 22–32)
Chloride: 105 mmol/L (ref 101–111)
Creatinine, Ser: 0.49 mg/dL (ref 0.44–1.00)
GLUCOSE: 79 mg/dL (ref 65–99)
Potassium: 3.7 mmol/L (ref 3.5–5.1)
SODIUM: 139 mmol/L (ref 135–145)

## 2015-01-10 MED ORDER — SODIUM CHLORIDE 0.9 % IJ SOLN
10.0000 mL | Freq: Two times a day (BID) | INTRAMUSCULAR | Status: DC
  Administered 2015-01-11: 10 mL

## 2015-01-10 MED ORDER — SODIUM CHLORIDE 0.9 % IJ SOLN
10.0000 mL | INTRAMUSCULAR | Status: DC | PRN
  Administered 2015-01-10 – 2015-01-15 (×4): 10 mL
  Filled 2015-01-10 (×4): qty 40

## 2015-01-10 MED ORDER — HEPARIN (PORCINE) IN NACL 100-0.45 UNIT/ML-% IJ SOLN
1000.0000 [IU]/h | INTRAMUSCULAR | Status: DC
  Administered 2015-01-10: 1000 [IU]/h via INTRAVENOUS
  Filled 2015-01-10: qty 250

## 2015-01-10 MED ORDER — DEXTROSE 5 % IV SOLN
1000.0000 mg | Freq: Three times a day (TID) | INTRAVENOUS | Status: DC | PRN
Start: 2015-01-10 — End: 2015-01-15
  Filled 2015-01-10: qty 10

## 2015-01-10 MED ORDER — HYDROMORPHONE HCL 1 MG/ML IJ SOLN
0.5000 mg | INTRAMUSCULAR | Status: DC | PRN
  Administered 2015-01-10 – 2015-01-14 (×15): 1 mg via INTRAVENOUS
  Filled 2015-01-10 (×15): qty 1

## 2015-01-10 NOTE — Progress Notes (Signed)
[facility] PROGRESS NOTE  Alicia [name] TGG:[id number] DOB: 09-08-1960 DOA: 01/08/2015 PCP: Minerva Ends, MD  Assessment/Plan: 1. SBO 1. General Surgery is following, appreciate input 2. Surgery recommendations for continued conservative measures and supportive care with NG if needed for nausea/vomiting 3. Patient reports bowel movement last night 4. Overall, patient states feeling slightly, if any, better but with some tenderness in the epigastric region 2. Gastric Cancer 1. Followed by Oncology - Dr. Benay Spice 3. Hx PE 1. Coumadin on hold, but pt is continued on heparin gtt given hypercoagulable state 4. DVT prophylaxis 1. Cont on Heparin gtt  Code Status: Full Family Communication: Pt in room Disposition Plan: Pending   Consultants:  General Surgery  Procedures:    Antibiotics:   (indicate start date, and stop date if known)  HPI/Subjective: Through translator app, patient reports some pain in the epigastric region. No other complaints.  Objective: Filed Vitals:   01/10/15 0238 01/10/15 0503 01/10/15 0933 01/10/15 1430  BP: 121/68 132/66 134/80 147/105  Pulse: 68 81 80 78  Temp: 98 F (36.7 C) 98.2 F (36.8 C) 98.1 F (36.7 C) 99.1 F (37.3 C)  TempSrc: Oral Oral Oral Oral  Resp: 14 14 14 14   Height:      Weight:      SpO2: 94% 94% 100% 100%    Intake/Output Summary (Last 24 hours) at 01/10/15 1541 Last data filed at 01/10/15 1000  Gross per 24 hour  Intake   1234 ml  Output    500 ml  Net    734 ml   Filed Weights   01/09/15 0213  Weight: 75.569 kg (166 lb 9.6 oz)    Exam:   General:  Asleep, easily arousable, in nad  Cardiovascular: regular, s1, s2  Respiratory: normal resp effort, no wheezing  Abdomen: soft, mildly distended, decreased BS  Musculoskeletal: perfused, no clubbing   Data Reviewed: Basic Metabolic Panel:  Recent Labs Lab 01/08/15 1902 01/09/15 0437 01/10/15 0630  NA 137 138 139  K 3.1* 3.4* 3.7   CL 100* 101 105  CO2 28 30 26   GLUCOSE 112* 109* 79  BUN 9 10 8   CREATININE 0.55 0.57 0.49  CALCIUM 8.6* 8.1* 8.1*  MG  --  2.3  --   PHOS  --  5.1*  --    Liver Function Tests:  Recent Labs Lab 01/08/15 1902 01/09/15 0437  AST 15 25  ALT 9* 11*  ALKPHOS 90 86  BILITOT 0.9 0.7  PROT 6.3* 5.5*  ALBUMIN 3.6 3.1*    Recent Labs Lab 01/08/15 1902  LIPASE 11*   No results for input(s): AMMONIA in the last 168 hours. CBC:  Recent Labs Lab 01/08/15 1902 01/09/15 0437 01/10/15 0630  WBC 7.0 6.4 4.2  NEUTROABS 4.8  --  2.8  HGB 9.9* 8.3* 8.5*  HCT 31.8* 27.3* 28.3*  MCV 94.4 95.8 96.9  PLT 287 255 234   Cardiac Enzymes: No results for input(s): CKTOTAL, CKMB, CKMBINDEX, TROPONINI in the last 168 hours. BNP (last 3 results) No results for input(s): BNP in the last 8760 hours.  ProBNP (last 3 results) No results for input(s): PROBNP in the last 8760 hours.  CBG: No results for input(s): GLUCAP in the last 168 hours.  No results found for this or any previous visit (from the past 240 hour(s)).   Studies: Ct Abdomen Pelvis W Contrast  01/08/2015   CLINICAL DATA:  Abdominal pain and diarrhea. History of adenocarcinoma the small intestine.  EXAM: CT ABDOMEN AND PELVIS WITH CONTRAST  TECHNIQUE: Multidetector CT imaging of the abdomen and pelvis was performed using the standard protocol following bolus administration of intravenous contrast.  CONTRAST:  127mL OMNIPAQUE IOHEXOL 300 MG/ML  SOLN  COMPARISON:  CT abdomen pelvis - 12/05/2014; 10/14/2014 ; CT the chest, abdomen pelvis - 07/22/2014  FINDINGS: Postsurgical change of the mid small bowel located within the midline of the lower pelvis. There is moderate upstream gas and fluid distention of the small bowel with apparent transition point within the mid abdomen adjacent to the operative site (axial image 58, series 2, coronal image 29, series 3) with subsequent decompression of the distal small bowel and colon, findings  compatible with small bowel obstruction.  Interval development of a small amount of intra-abdominal ascites with minimal amount of free fluid within the root of the abdominal mesenteric (51, series 2) without definable/drainable fluid collection. No pneumoperitoneum, pneumatosis or portal venous gas.  Scattered shotty retroperitoneal lymph nodes are numerous though individually not enlarged by size criteria with index left-sided periaortic lymph node measuring 0.9 cm in greatest short axis diameter (image 19, series 7). Omental caking about the cranial aspect of the greater omentum is grossly unchanged with index omental nodule measuring approximately 1 cm in greatest short axis diameter (Image 9, series 7).  Normal hepatic contour. The gallbladder is under distended and thus suboptimally evaluated. Borderline enlarged caliber the common bile duct measuring 9 mm in diameter. There is mild periportal edema. The the portal vein appears patent .  There is symmetric enhancement and excretion of the bilateral kidneys. Interval development of mild right-sided pelvicaliectasis and ureterectasis, the etiology of which is not depicted and as the examination though appears to be centered at the location of the pelvic brim. No definite renal stones. No left-sided urinary obstruction. No renal lesions.  Normal appearance of the bilateral adrenal glands, pancreas and spleen.  Limited visualization of lower thorax demonstrates minimal linear subsegmental atelectasis within the imaged right lower lobe. No discrete focal airspace opacities. No pleural effusion.  Borderline cardiomegaly.  No pericardial effusion.  Sclerotic approximately 1.4 x 1.3 cm osseous metastasis within the right ilium is grossly unchanged (image 70, series 2).  Nodular thickening about the caudal aspect of the midline ventral abdominal wall surgical scars grossly unchanged (representative image 54, series 2).  IMPRESSION: 1. Findings worrisome for distal  small bowel obstruction with transition point regional to the small bowel enteric anastomosis and thus presumably secondary to adhesions. No evidence of perforation or definable/drainable fluid collection. 2. Interval development of mild right-sided pelviectasis and ureterectasis with the right ureter dilated to the level of the pelvic brim - the exact etiology of this obstruction is not depicted on this examination (specifically, no obstructing right-sided ureteral stone is identified) and in the setting of bilateral pelvic sidewall adenectomy, this apparent right-sided ureteral obstruction could be secondary to developing adhesions. 3. Interval development of a small amount of intra-abdominal ascites, presumably reactive in etiology though in the setting of grossly unchanged peritoneal carcinomatosis, malignant ascites is not excluded. 4. Unchanged approximately 1.4 cm sclerotic osseous metastasis within the right ilium.   Electronically Signed   By: Sandi Mariscal M.D.   On: 01/08/2015 22:50   Dg Abd 2 Views  01/10/2015   CLINICAL DATA:  Patient with 1 month of abdominal pain.  EXAM: ABDOMEN - 2 VIEW  COMPARISON:  01/09/2015  FINDINGS: Multiple gaseous distended loops of small bowel are demonstrated within the central abdomen measuring up  to 4 cm. Differential air-fluid levels are demonstrated on the upright imaging. No free intraperitoneal air. Minimal atelectasis left lung base. Pelvic surgical clips. Regional skeleton unremarkable. Contrast material in the distal colon.  IMPRESSION: Differential air-fluid levels on upright imaging most compatible with small bowel obstruction. Contrast is demonstrated within the distal colon therefore this may represent a partial obstruction.   Electronically Signed   By: Lovey Newcomer M.D.   On: 01/10/2015 08:37   Dg Abd 2 Views  01/09/2015   CLINICAL DATA:  Gastric cancer, abdominal pain, nausea and vomiting.  EXAM: ABDOMEN - 2 VIEW  COMPARISON:  CT abdomen/pelvis 01/08/2015   FINDINGS: Multiple differential air-fluid levels are identified, predominantly colon, although a mildly dilated mid abdominal small bowel loop is identified measuring at least 3.3 cm. Retained contrast within moderately dilated right renal collecting system and right ureter. Pelvic clips are noted. No free air.  IMPRESSION: Multiple air-fluid levels suggesting mid to distal small bowel obstruction as seen previously. Contrast does reach the colon therefore an element of partial obstruction or ileus could appear similar.  Right hydroureteronephrosis reidentified.   Electronically Signed   By: Conchita Paris M.D.   On: 01/09/2015 09:24    Scheduled Meds: . sodium chloride  10-40 mL Intracatheter Q12H   Continuous Infusions: . sodium chloride 75 mL/hr at 01/09/15 1824    Active Problems:   PE (pulmonary embolism)   Anticoagulated on Coumadin   Gastric cancer   Nausea with vomiting   SBO (small bowel obstruction)   Alicia [name], [facility] Pager [phone]. If 7PM-7AM, please contact night-coverage at [email or web address], password [facility] 01/10/2015, 3:41 PM  LOS: 1 day

## 2015-01-10 NOTE — Progress Notes (Signed)
IP PROGRESS NOTE  Subjective:   She is known to me with a history of metastatic gastric cancer. She is currently being treated with weekly Taxol and every 2 week ramucirumab. Last Ramucirumab 12/31/2014. She was admitted 01/09/2015 with nausea/vomiting and abdominal pain. She reports the symptoms began on 01/06/2015.  She denies nausea this morning. She complains of mid abdominal pain.  Objective: Vital signs in last 24 hours: Blood pressure 132/66, pulse 81, temperature 98.2 F (36.8 C), temperature source Oral, resp. rate 14, height 5' (1.524 m), weight 166 lb 9.6 oz (75.569 kg), SpO2 94 %.  Intake/Output from previous day: 05/26 0701 - 05/27 0700 In: 1677.8 [I.V.:1277.8; IV Piggyback:400] Out: 900 [Urine:900]  Physical Exam:  HEENT: No thrush or ulcers Lungs: Inspiratory rales at the left posterior base, no respiratory distress Cardiac: Regular rate and rhythm Abdomen: Soft, tender in the mid abdomen, periumbilical masslike fullness appears smaller, active bowel sounds Extremities: No leg edema   Portacath/PICC-without erythema  Lab Results:  Recent Labs  01/09/15 0437 01/10/15 0630  WBC 6.4 4.2  HGB 8.3* 8.5*  HCT 27.3* 28.3*  PLT 255 234    BMET  Recent Labs  01/09/15 0437 01/10/15 0630  NA 138 139  K 3.4* 3.7  CL 101 105  CO2 30 26  GLUCOSE 109* 79  BUN 10 8  CREATININE 0.57 0.49  CALCIUM 8.1* 8.1*    Studies/Results: Ct Abdomen Pelvis W Contrast  01/08/2015   CLINICAL DATA:  Abdominal pain and diarrhea. History of adenocarcinoma the small intestine.  EXAM: CT ABDOMEN AND PELVIS WITH CONTRAST  TECHNIQUE: Multidetector CT imaging of the abdomen and pelvis was performed using the standard protocol following bolus administration of intravenous contrast.  CONTRAST:  145mL OMNIPAQUE IOHEXOL 300 MG/ML  SOLN  COMPARISON:  CT abdomen pelvis - 12/05/2014; 10/14/2014 ; CT the chest, abdomen pelvis - 07/22/2014  FINDINGS: Postsurgical change of the mid small  bowel located within the midline of the lower pelvis. There is moderate upstream gas and fluid distention of the small bowel with apparent transition point within the mid abdomen adjacent to the operative site (axial image 58, series 2, coronal image 29, series 3) with subsequent decompression of the distal small bowel and colon, findings compatible with small bowel obstruction.  Interval development of a small amount of intra-abdominal ascites with minimal amount of free fluid within the root of the abdominal mesenteric (51, series 2) without definable/drainable fluid collection. No pneumoperitoneum, pneumatosis or portal venous gas.  Scattered shotty retroperitoneal lymph nodes are numerous though individually not enlarged by size criteria with index left-sided periaortic lymph node measuring 0.9 cm in greatest short axis diameter (image 19, series 7). Omental caking about the cranial aspect of the greater omentum is grossly unchanged with index omental nodule measuring approximately 1 cm in greatest short axis diameter (Image 9, series 7).  Normal hepatic contour. The gallbladder is under distended and thus suboptimally evaluated. Borderline enlarged caliber the common bile duct measuring 9 mm in diameter. There is mild periportal edema. The the portal vein appears patent .  There is symmetric enhancement and excretion of the bilateral kidneys. Interval development of mild right-sided pelvicaliectasis and ureterectasis, the etiology of which is not depicted and as the examination though appears to be centered at the location of the pelvic brim. No definite renal stones. No left-sided urinary obstruction. No renal lesions.  Normal appearance of the bilateral adrenal glands, pancreas and spleen.  Limited visualization of lower thorax demonstrates minimal linear  subsegmental atelectasis within the imaged right lower lobe. No discrete focal airspace opacities. No pleural effusion.  Borderline cardiomegaly.  No  pericardial effusion.  Sclerotic approximately 1.4 x 1.3 cm osseous metastasis within the right ilium is grossly unchanged (image 70, series 2).  Nodular thickening about the caudal aspect of the midline ventral abdominal wall surgical scars grossly unchanged (representative image 54, series 2).  IMPRESSION: 1. Findings worrisome for distal small bowel obstruction with transition point regional to the small bowel enteric anastomosis and thus presumably secondary to adhesions. No evidence of perforation or definable/drainable fluid collection. 2. Interval development of mild right-sided pelviectasis and ureterectasis with the right ureter dilated to the level of the pelvic brim - the exact etiology of this obstruction is not depicted on this examination (specifically, no obstructing right-sided ureteral stone is identified) and in the setting of bilateral pelvic sidewall adenectomy, this apparent right-sided ureteral obstruction could be secondary to developing adhesions. 3. Interval development of a small amount of intra-abdominal ascites, presumably reactive in etiology though in the setting of grossly unchanged peritoneal carcinomatosis, malignant ascites is not excluded. 4. Unchanged approximately 1.4 cm sclerotic osseous metastasis within the right ilium.   Electronically Signed   By: Sandi Mariscal M.D.   On: 01/08/2015 22:50   Dg Abd 2 Views  01/09/2015   CLINICAL DATA:  Gastric cancer, abdominal pain, nausea and vomiting.  EXAM: ABDOMEN - 2 VIEW  COMPARISON:  CT abdomen/pelvis 01/08/2015  FINDINGS: Multiple differential air-fluid levels are identified, predominantly colon, although a mildly dilated mid abdominal small bowel loop is identified measuring at least 3.3 cm. Retained contrast within moderately dilated right renal collecting system and right ureter. Pelvic clips are noted. No free air.  IMPRESSION: Multiple air-fluid levels suggesting mid to distal small bowel obstruction as seen previously.  Contrast does reach the colon therefore an element of partial obstruction or ileus could appear similar.  Right hydroureteronephrosis reidentified.   Electronically Signed   By: Conchita Paris M.D.   On: 01/09/2015 09:24    Medications: I have reviewed the patient's current medications.  Assessment/Plan: 1. Stage IV gastric cancer  Status post bilateral oophorectomy, total abdominal hysterectomy, omentectomy, resection of abdominal wall and pelvic masses, bilateral pelvic lymphadenectomy 10/18/2013 with the pathology confirming moderate to poorly differentiated adenocarcinoma  Stomach biopsy 11/28/2013 revealed adenocarcinoma  Status post 12 cycles of FOLFOX, last given 04/25/2014  Restaging CT 04/29/2014 with thickening of the stomach and nodular densities adjacent to the greater curvature-unchanged, faint groundglass nodularity in the lungs-nonspecific  Restaging CT 07/22/2014 with indeterminate lung nodules; moderate wall thickening in the body region of the stomach; fairly extensive omental disease and findings suspicious for abdominal wall and incisional disease.  Started systemic chemotherapy with FOLFIRI on 07/30/14.  Restaging CT evaluation 10/14/2014 following 5 cycles of FOLFIRI showed decreased nodularity in both lungs; overall stable omental nodularity; involution of the previously demonstrated ill-defined soft tissue mass involving the anterior abdominal wall incision.  Cycle 6 FOLFIRI 10/15/2014  Cycle 7 FOLFIRI 10/29/2014  Cycle 8 FOLFIRI 11/12/2014  Cycle 9 FOLFIRI 11/26/2014 (dose reduction of 5-fluorouracil due to mucositis)  CT abdomen/pelvis 12/05/2014 revealed mild progression of peritoneal/omental tumor  Initiation of weekly Taxol (3 weeks on/1 week off) and every 2 week Ramucirumab 12/17/2014   2. Oxaliplatin neuropathy   3. Bilateral upper and lower lobe pulmonary emboli within segmental branches-now maintained on Coumadin. Followed by Coumadin  clinic at the [facility].   4. Neutropenia related to chemotherapy 08/13/2014. Cycle 2  FOLFIRI held. Neulasta added beginning with cycle 2 on 08/20/2014.   5. Nausea following chemotherapy-the anti-emetic regimen was adjusted with cycle 4 FOLFIRI 09/17/2014   6. Diarrhea following FOLFIRI-no improvement with Imodium. Lomotil initiated 10/15/2014.    7. Mouth sores following cycle 8 FOLFIRI. 5 fluorouracil dose reduced.   8. Admission 12/05/2014 with nausea/vomiting and diarrhea-likely secondary to FOLFIRI chemotherapy   9. Abdominal pain secondary to carcinomatosis and gastric tumor   10. 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  Admission 01/09/2015 with abdominal pain and nausea/vomiting-a CT confirmed a distal small bowel obstruction.   The obstructive symptoms appear improved. She reports no nausea this morning and had an episode of "diarrhea "last night. The abdominal pain is most likely secondary to carcinomatosis. Hopefully the bowel obstruction will resolve with conservative measures. The plan is to continueTaxol and ramucirumab as an outpatient.  Recommendations: 1. Continue management of bowel obstruction per recommendations of surgery 2. Narcotic analgesia for pain, add oxycodone when taking by mouth 3. Resume Coumadin anticoagulation when taking by mouth, heparin or Lovenox prophylaxis if the INR goes lower 4. Outpatient follow-up at the Cancer center 01/14/2015 as scheduled  Please call Oncology over the weekend as needed. I will check on her 01/13/2015 if she remains in the hospital.  LOS: 1 day   [facility], [place]  01/10/2015, 8:28 AM

## 2015-01-10 NOTE — Progress Notes (Signed)
Pharmacy: Re- heparin  71 yoF on chronic warfarin therapy PTA for history of PE admitted for SBO. 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 Plan is for medical management for now per surgery.   Patient's currently NPO with plan to resume coumadin back when patient is able to take oral medications and to bridge with heparin while coumadin is on hold. INR this afternoon now back as 1.91.   Plan: - heparin drip at 1000 units/hr (no bolus) - check 6 hour heparin level  Alicia [name], PharmD, BCPS 01/10/2015 7:19 PM

## 2015-01-10 NOTE — Telephone Encounter (Signed)
per Andria Frames to move pt appt to Lisa T 9:15 slot-move lab and keep flush-move CC-adv her to call CC to adv time was moved

## 2015-01-10 NOTE — Progress Notes (Signed)
[facility] Progress Note     Subjective: Talked with the patient via the telephone interpretor services.  No family at bedside.  The patient states the pain is the same as yesterday in the epigastrium.  No N/V.  Had diarrhea last night and some flatus.  Feels a bit less distended.    Objective: Vital signs in last 24 hours: Temp:  [97.5 F (36.4 C)-98.2 F (36.8 C)] 98.2 F (36.8 C) (05/27 0503) Pulse Rate:  [68-85] 81 (05/27 0503) Resp:  [14-18] 14 (05/27 0503) BP: (99-132)/(66-82) 132/66 mmHg (05/27 0503) SpO2:  [93 %-100 %] 94 % (05/27 0503) Last BM Date:  (prior to admission)  Intake/Output from previous day: 05/26 0701 - 05/27 0700 In: 1677.8 [I.V.:1277.8; IV Piggyback:400] Out: 900 [Urine:900] Intake/Output this shift:    PE: Gen:  Alert, NAD, pleasant Abd: Soft, mild distension, quite tender in the epigastrium, +BS, no HSM   Lab Results:   Recent Labs  01/09/15 0437 01/10/15 0630  WBC 6.4 4.2  HGB 8.3* 8.5*  HCT 27.3* 28.3*  PLT 255 234   BMET  Recent Labs  01/09/15 0437 01/10/15 0630  NA 138 139  K 3.4* 3.7  CL 101 105  CO2 30 26  GLUCOSE 109* 79  BUN 10 8  CREATININE 0.57 0.49  CALCIUM 8.1* 8.1*   PT/INR  Recent Labs  01/09/15 1350 01/10/15 0630  LABPROT 25.1* 23.0*  INR 2.31* 2.05*   CMP     Component Value Date/Time   NA 139 01/10/2015 0630   NA 140 12/31/2014 1030   K 3.7 01/10/2015 0630   K 3.7 12/31/2014 1030   CL 105 01/10/2015 0630   CO2 26 01/10/2015 0630   CO2 25 12/31/2014 1030   GLUCOSE 79 01/10/2015 0630   GLUCOSE 95 12/31/2014 1030   BUN 8 01/10/2015 0630   BUN 8.5 12/31/2014 1030   CREATININE 0.49 01/10/2015 0630   CREATININE 0.5* 12/31/2014 1030   CREATININE 0.61 05/17/2014 1515   CALCIUM 8.1* 01/10/2015 0630   CALCIUM 8.7 12/31/2014 1030   PROT 5.5* 01/09/2015 0437   PROT 6.2* 12/31/2014 1030   ALBUMIN 3.1* 01/09/2015 0437   ALBUMIN 3.5 12/31/2014 1030   AST 25 01/09/2015 0437   AST 13  12/31/2014 1030   ALT 11* 01/09/2015 0437   ALT 8 12/31/2014 1030   ALKPHOS 86 01/09/2015 0437   ALKPHOS 94 12/31/2014 1030   BILITOT 0.7 01/09/2015 0437   BILITOT 0.65 12/31/2014 1030   GFRNONAA >60 01/10/2015 0630   GFRNONAA >89 05/17/2014 1515   GFRAA >60 01/10/2015 0630   GFRAA >89 05/17/2014 1515   Lipase     Component Value Date/Time   LIPASE 11* 01/08/2015 1902       Studies/Results: Ct Abdomen Pelvis W Contrast  01/08/2015   CLINICAL DATA:  Abdominal pain and diarrhea. History of adenocarcinoma the small intestine.  EXAM: CT ABDOMEN AND PELVIS WITH CONTRAST  TECHNIQUE: Multidetector CT imaging of the abdomen and pelvis was performed using the standard protocol following bolus administration of intravenous contrast.  CONTRAST:  155mL OMNIPAQUE IOHEXOL 300 MG/ML  SOLN  COMPARISON:  CT abdomen pelvis - 12/05/2014; 10/14/2014 ; CT the chest, abdomen pelvis - 07/22/2014  FINDINGS: Postsurgical change of the mid small bowel located within the midline of the lower pelvis. There is moderate upstream gas and fluid distention of the small bowel with apparent transition point within the mid abdomen adjacent to the operative site (axial image 58, series 2,  coronal image 29, series 3) with subsequent decompression of the distal small bowel and colon, findings compatible with small bowel obstruction.  Interval development of a small amount of intra-abdominal ascites with minimal amount of free fluid within the root of the abdominal mesenteric (51, series 2) without definable/drainable fluid collection. No pneumoperitoneum, pneumatosis or portal venous gas.  Scattered shotty retroperitoneal lymph nodes are numerous though individually not enlarged by size criteria with index left-sided periaortic lymph node measuring 0.9 cm in greatest short axis diameter (image 19, series 7). Omental caking about the cranial aspect of the greater omentum is grossly unchanged with index omental nodule measuring  approximately 1 cm in greatest short axis diameter (Image 9, series 7).  Normal hepatic contour. The gallbladder is under distended and thus suboptimally evaluated. Borderline enlarged caliber the common bile duct measuring 9 mm in diameter. There is mild periportal edema. The the portal vein appears patent .  There is symmetric enhancement and excretion of the bilateral kidneys. Interval development of mild right-sided pelvicaliectasis and ureterectasis, the etiology of which is not depicted and as the examination though appears to be centered at the location of the pelvic brim. No definite renal stones. No left-sided urinary obstruction. No renal lesions.  Normal appearance of the bilateral adrenal glands, pancreas and spleen.  Limited visualization of lower thorax demonstrates minimal linear subsegmental atelectasis within the imaged right lower lobe. No discrete focal airspace opacities. No pleural effusion.  Borderline cardiomegaly.  No pericardial effusion.  Sclerotic approximately 1.4 x 1.3 cm osseous metastasis within the right ilium is grossly unchanged (image 70, series 2).  Nodular thickening about the caudal aspect of the midline ventral abdominal wall surgical scars grossly unchanged (representative image 54, series 2).  IMPRESSION: 1. Findings worrisome for distal small bowel obstruction with transition point regional to the small bowel enteric anastomosis and thus presumably secondary to adhesions. No evidence of perforation or definable/drainable fluid collection. 2. Interval development of mild right-sided pelviectasis and ureterectasis with the right ureter dilated to the level of the pelvic brim - the exact etiology of this obstruction is not depicted on this examination (specifically, no obstructing right-sided ureteral stone is identified) and in the setting of bilateral pelvic sidewall adenectomy, this apparent right-sided ureteral obstruction could be secondary to developing adhesions. 3.  Interval development of a small amount of intra-abdominal ascites, presumably reactive in etiology though in the setting of grossly unchanged peritoneal carcinomatosis, malignant ascites is not excluded. 4. Unchanged approximately 1.4 cm sclerotic osseous metastasis within the right ilium.   Electronically Signed   By: Sandi Mariscal M.D.   On: 01/08/2015 22:50   Dg Abd 2 Views  01/10/2015   CLINICAL DATA:  Patient with 1 month of abdominal pain.  EXAM: ABDOMEN - 2 VIEW  COMPARISON:  01/09/2015  FINDINGS: Multiple gaseous distended loops of small bowel are demonstrated within the central abdomen measuring up to 4 cm. Differential air-fluid levels are demonstrated on the upright imaging. No free intraperitoneal air. Minimal atelectasis left lung base. Pelvic surgical clips. Regional skeleton unremarkable. Contrast material in the distal colon.  IMPRESSION: Differential air-fluid levels on upright imaging most compatible with small bowel obstruction. Contrast is demonstrated within the distal colon therefore this may represent a partial obstruction.   Electronically Signed   By: Lovey Newcomer M.D.   On: 01/10/2015 08:37   Dg Abd 2 Views  01/09/2015   CLINICAL DATA:  Gastric cancer, abdominal pain, nausea and vomiting.  EXAM: ABDOMEN - 2 VIEW  COMPARISON:  CT abdomen/pelvis 01/08/2015  FINDINGS: Multiple differential air-fluid levels are identified, predominantly colon, although a mildly dilated mid abdominal small bowel loop is identified measuring at least 3.3 cm. Retained contrast within moderately dilated right renal collecting system and right ureter. Pelvic clips are noted. No free air.  IMPRESSION: Multiple air-fluid levels suggesting mid to distal small bowel obstruction as seen previously. Contrast does reach the colon therefore an element of partial obstruction or ileus could appear similar.  Right hydroureteronephrosis reidentified.   Electronically Signed   By: Conchita Paris M.D.   On: 01/09/2015 09:24     Anti-infectives: Anti-infectives    None       Assessment/Plan SBO -Metastatic gastric cancer Stage IV. S/p bilateral oophorectomy, total abdominal hysterectomy, omentectomy, resection of abdominal wall and pelvic masses, bilateral pelvic lymphadenectomy 10/18/2013 with the pathology confirming moderate to poorly differentiated adenocarcinoma -Stomach biopsy 11/28/2013 revealed adenocarcinoma -DG abdomen this am shows pSBO, but air to colon.  For now, the most appropriate treatment is medical management. -There is no evidence of compromised bowel or indication of urgent surgical complication. -Bowel rest except ice chips, IV hydration, NG tube if vomits -Laparotomy will be reserved for failure to resolve her SBO, and only as a last resort. -Holding Coumadin to allow INR to normalize. On heparin in the interim. -Ambulate and IS -Add robaxin and ice for pain, increase frequency of dilaudid  Hx of PE now on coumadin therapy at home Chronic abdominal pain secondary to carcinomatosis and gastric tumor -Status post 12 cycles of FOLFOX, last given 04/25/2014 Neutropenia related to chemotherapy Anemia DVT: INR 2.05/SCD Antibiotics: None    LOS: 1 day    Nat Christen 01/10/2015, 8:57 AM Pager: [phone]

## 2015-01-10 NOTE — Progress Notes (Signed)
ANTICOAGULATION CONSULT NOTE - Follow Up Consult  Pharmacy Consult for Heparin Indication: hx of PE, warfarin bridging  Allergies  Allergen Reactions  . Xarelto [Rivaroxaban] Nausea And Vomiting, Palpitations and Other (See Comments)    Headaches and fatigue, numbness in hands/feet and vomitting    Patient Measurements: Height: 5' (152.4 cm) Weight: 166 lb 9.6 oz (75.569 kg) IBW/kg (Calculated) : 45.5 Heparin Dosing Weight: 62 kg  Vital Signs: Temp: 98.2 F (36.8 C) (05/27 0503) Temp Source: Oral (05/27 0503) BP: 132/66 mmHg (05/27 0503) Pulse Rate: 81 (05/27 0503)  Labs:  Recent Labs  01/08/15 1902 01/09/15 0015 01/09/15 0437 01/09/15 1350 01/10/15 0630  HGB 9.9*  --  8.3*  --  8.5*  HCT 31.8*  --  27.3*  --  28.3*  PLT 287  --  255  --  234  APTT  --   --   --  37  --   LABPROT  --  24.6*  --  25.1* 23.0*  INR  --  2.24*  --  2.31* 2.05*  CREATININE 0.55  --  0.57  --  0.49    Estimated Creatinine Clearance: 72.1 mL/min (by C-G formula based on Cr of 0.49).   Assessment: 32 yoF on chronic warfarin therapy PTA for history of PE admitted for SBO.  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  Plan is for medical management for now per surgery.  Holding warfarin to allow INR to normalize in case surgery becomes necessary and will start heparin for bridge once INR<2.  Last warfarin dose 5/24.  Hgb low but stable.   Goal of Therapy:  Heparin level 0.3-0.7 units/ml Monitor platelets by anticoagulation protocol: Yes   Plan:  Waiting on INR to normalize.  Repeat INR this evening.  As soon as INR<2, begin heparin infusion at 1000 units/hr (10 ml/hr) without bolus.  Hershal Coria 01/10/2015,7:26 AM

## 2015-01-11 ENCOUNTER — Inpatient Hospital Stay (HOSPITAL_COMMUNITY): Payer: Medicaid Other

## 2015-01-11 LAB — CBC
HCT: 27.1 % — ABNORMAL LOW (ref 36.0–46.0)
Hemoglobin: 8.2 g/dL — ABNORMAL LOW (ref 12.0–15.0)
MCH: 29.2 pg (ref 26.0–34.0)
MCHC: 30.3 g/dL (ref 30.0–36.0)
MCV: 96.4 fL (ref 78.0–100.0)
Platelets: 228 10*3/uL (ref 150–400)
RBC: 2.81 MIL/uL — ABNORMAL LOW (ref 3.87–5.11)
RDW: 17.3 % — ABNORMAL HIGH (ref 11.5–15.5)
WBC: 5.2 10*3/uL (ref 4.0–10.5)

## 2015-01-11 LAB — HEPARIN LEVEL (UNFRACTIONATED)
Heparin Unfractionated: 0.29 IU/mL — ABNORMAL LOW (ref 0.30–0.70)
Heparin Unfractionated: 0.77 IU/mL — ABNORMAL HIGH (ref 0.30–0.70)
Heparin Unfractionated: 0.51 IU/mL (ref 0.30–0.70)

## 2015-01-11 MED ORDER — HEPARIN (PORCINE) IN NACL 100-0.45 UNIT/ML-% IJ SOLN
950.0000 [IU]/h | INTRAMUSCULAR | Status: DC
  Administered 2015-01-11 – 2015-01-12 (×2): 950 [IU]/h via INTRAVENOUS
  Filled 2015-01-11 (×6): qty 250

## 2015-01-11 MED ORDER — HEPARIN (PORCINE) IN NACL 100-0.45 UNIT/ML-% IJ SOLN
1150.0000 [IU]/h | INTRAMUSCULAR | Status: DC
  Administered 2015-01-11: 1150 [IU]/h via INTRAVENOUS
  Filled 2015-01-11: qty 250

## 2015-01-11 NOTE — Progress Notes (Signed)
ANTICOAGULATION CONSULT NOTE - Follow Up Consult  Pharmacy Consult for Heparin Indication: hx of PE, warfarin bridging  Allergies  Allergen Reactions  . Xarelto [Rivaroxaban] Nausea And Vomiting, Palpitations and Other (See Comments)    Headaches and fatigue, numbness in hands/feet and vomitting    Patient Measurements: Height: 5' (152.4 cm) Weight: 166 lb 9.6 oz (75.569 kg) IBW/kg (Calculated) : 45.5 Heparin Dosing Weight: 62 kg  Vital Signs: Temp: 99 F (37.2 C) (05/28 1456) Temp Source: Oral (05/28 1456) BP: 151/86 mmHg (05/28 1539) Pulse Rate: 79 (05/28 1539)  Labs:  Recent Labs  01/09/15 0437 01/09/15 1350 01/10/15 0630 01/10/15 1315 01/11/15 0250 01/11/15 1014 01/11/15 1915  HGB 8.3*  --  8.5*  --  8.2*  --   --   HCT 27.3*  --  28.3*  --  27.1*  --   --   PLT 255  --  234  --  228  --   --   APTT  --  37  --   --   --   --   --   LABPROT  --  25.1* 23.0* 21.8*  --   --   --   INR  --  2.31* 2.05* 1.91*  --   --   --   HEPARINUNFRC  --   --   --   --  0.29* 0.77* 0.51  CREATININE 0.57  --  0.49  --   --   --   --     Estimated Creatinine Clearance: 72.1 mL/min (by C-G formula based on Cr of 0.49).   Assessment: 54 yoF on chronic warfarin therapy PTA for history of PE admitted for SBO.  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  Plan is for medical management for now per surgery.  Holding warfarin to allow INR to normalize in case surgery becomes necessary and will start heparin for bridge once INR<2.  Last warfarin dose 5/24.  Hgb low but stable.   Today, 01/11/2015  Heparin level now therapeutic after a rate decrease to 950 units/hr  Hgb low but stable, Plts WNL this AM  No reported problems per RN or noted  bleeding  Goal of Therapy:  Heparin level 0.3-0.7 units/ml Monitor platelets by anticoagulation protocol: Yes   Plan:  1) Continue IV heparin 950 units/hr 2) Daily heparin level and CBC   Peggyann Juba, PharmD, BCPS Pager: [phone]  01/11/2015 7:47 PM

## 2015-01-11 NOTE — Progress Notes (Signed)
ANTICOAGULATION CONSULT NOTE - Follow Up Consult  Pharmacy Consult for Heparin Indication: pulmonary embolus  Allergies  Allergen Reactions  . Xarelto [Rivaroxaban] Nausea And Vomiting, Palpitations and Other (See Comments)    Headaches and fatigue, numbness in hands/feet and vomitting    Patient Measurements: Height: 5' (152.4 cm) Weight: 166 lb 9.6 oz (75.569 kg) IBW/kg (Calculated) : 45.5 Heparin Dosing Weight:   Vital Signs: Temp: 98.1 F (36.7 C) (05/28 0215) Temp Source: Oral (05/28 0215) BP: 132/74 mmHg (05/28 0215) Pulse Rate: 73 (05/28 0215)  Labs:  Recent Labs  01/08/15 1902  01/09/15 0437 01/09/15 1350 01/10/15 0630 01/10/15 1315 01/11/15 0250  HGB 9.9*  --  8.3*  --  8.5*  --  8.2*  HCT 31.8*  --  27.3*  --  28.3*  --  27.1*  PLT 287  --  255  --  234  --  228  APTT  --   --   --  37  --   --   --   LABPROT  --   < >  --  25.1* 23.0* 21.8*  --   INR  --   < >  --  2.31* 2.05* 1.91*  --   HEPARINUNFRC  --   --   --   --   --   --  0.29*  CREATININE 0.55  --  0.57  --  0.49  --   --   < > = values in this interval not displayed.  Estimated Creatinine Clearance: 72.1 mL/min (by C-G formula based on Cr of 0.49).   Medications:  Infusions:  . sodium chloride 75 mL/hr at 01/11/15 0200  . heparin      Assessment: Patient with heparin just below goal.  No heparin issues per RN.  Goal of Therapy:  Heparin level 0.3-0.7 units/ml Monitor platelets by anticoagulation protocol: Yes   Plan:  Increase heparin to 1150 units/hr Recheck level at [place] 01/11/2015,3:29 AM

## 2015-01-11 NOTE — Progress Notes (Signed)
ANTICOAGULATION CONSULT NOTE - Follow Up Consult  Pharmacy Consult for Heparin Indication: hx of PE, warfarin bridging  Allergies  Allergen Reactions  . Xarelto [Rivaroxaban] Nausea And Vomiting, Palpitations and Other (See Comments)    Headaches and fatigue, numbness in hands/feet and vomitting    Patient Measurements: Height: 5' (152.4 cm) Weight: 166 lb 9.6 oz (75.569 kg) IBW/kg (Calculated) : 45.5 Heparin Dosing Weight: 62 kg  Vital Signs: Temp: 98.7 F (37.1 C) (05/28 0600) Temp Source: Oral (05/28 0600) BP: 140/77 mmHg (05/28 0600) Pulse Rate: 80 (05/28 0600)  Labs:  Recent Labs  01/08/15 1902  01/09/15 0437 01/09/15 1350 01/10/15 0630 01/10/15 1315 01/11/15 0250 01/11/15 1014  HGB 9.9*  --  8.3*  --  8.5*  --  8.2*  --   HCT 31.8*  --  27.3*  --  28.3*  --  27.1*  --   PLT 287  --  255  --  234  --  228  --   APTT  --   --   --  37  --   --   --   --   LABPROT  --   < >  --  25.1* 23.0* 21.8*  --   --   INR  --   < >  --  2.31* 2.05* 1.91*  --   --   HEPARINUNFRC  --   --   --   --   --   --  0.29* 0.77*  CREATININE 0.55  --  0.57  --  0.49  --   --   --   < > = values in this interval not displayed.  Estimated Creatinine Clearance: 72.1 mL/min (by C-G formula based on Cr of 0.49).   Assessment: 63 yoF on chronic warfarin therapy PTA for history of PE admitted for SBO.  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  Plan is for medical management for now per surgery.  Holding warfarin to allow INR to normalize in case surgery becomes necessary and will start heparin for bridge once INR<2.  Last warfarin dose 5/24.  Hgb low but stable.   Today, 01/11/2015  Heparin level now supratherapeutic after a rate increase this AM to  1150 units/hr  Hgb low but stable, Plts WNL  No reported problems per RN or noted bleeding  Goal of Therapy:  Heparin level 0.3-0.7 units/ml Monitor platelets by anticoagulation protocol: Yes   Plan:  1) Decrease IV heparin rate to 950 units/hr 2) Recheck heparin level 6 hours after change of rate 3) daily heparin level and CBC   Adrian Saran, PharmD, BCPS Pager [phone] 01/11/2015 11:38 AM

## 2015-01-11 NOTE — Progress Notes (Signed)
[facility] PROGRESS NOTE  Alicia [name] TKZ:[id number] DOB: [birth year] DOA: 01/08/2015 PCP: Minerva Ends, MD  Assessment/Plan: 1. SBO 1. General Surgery continues to follow, appreciate input 2. Xray this AM without significant improvement although patient reports passing flatus with bowel sounds on exam 3. Surgery recommendations for continued conservative measures and supportive care for now 2. Gastric Cancer 1. Followed by Oncology - Dr. Benay Spice 3. Hypokalemia 1. Replaced 2. Continue to follow lytes 4. Moderate protein calorie malnutrition 1. Nutrition following 2. Hopefully will be able to start diet soon 5. Hx PE 1. Coumadin on hold, but pt is continued on heparin gtt given hypercoagulable state 6. DVT prophylaxis 1. Cont on Heparin gtt  Code Status: Full Family Communication: Pt in room Disposition Plan: Pending   Consultants:  General Surgery  Procedures:    Antibiotics:   (indicate start date, and stop date if known)  HPI/Subjective: Through translator app, patient still reports epigastric tenderness. Also reports multiple episodes of flatus. She states she is eager to start drinking fluids  Objective: Filed Vitals:   01/11/15 0215 01/11/15 0600 01/11/15 1456 01/11/15 1539  BP: 132/74 140/77 165/71 151/86  Pulse: 73 80 84 79  Temp: 98.1 F (36.7 C) 98.7 F (37.1 C) 99 F (37.2 C)   TempSrc: Oral Oral Oral   Resp: 16 20 16    Height:      Weight:      SpO2: 100% 96% 99%     Intake/Output Summary (Last 24 hours) at 01/11/15 1546 Last data filed at 01/11/15 1455  Gross per 24 hour  Intake   1330 ml  Output    500 ml  Net    830 ml   Filed Weights   01/09/15 0213  Weight: 75.569 kg (166 lb 9.6 oz)    Exam:   General:  Awake, on phone, in nad  Cardiovascular: regular, s1, s2  Respiratory: normal resp effort, no wheezing  Abdomen: soft, tender over epigastric region, mildly distended, positive BS  Musculoskeletal:  perfused, no clubbing   Data Reviewed: Basic Metabolic Panel:  Recent Labs Lab 01/08/15 1902 01/09/15 0437 01/10/15 0630  NA 137 138 139  K 3.1* 3.4* 3.7  CL 100* 101 105  CO2 28 30 26   GLUCOSE 112* 109* 79  BUN 9 10 8   CREATININE 0.55 0.57 0.49  CALCIUM 8.6* 8.1* 8.1*  MG  --  2.3  --   PHOS  --  5.1*  --    Liver Function Tests:  Recent Labs Lab 01/08/15 1902 01/09/15 0437  AST 15 25  ALT 9* 11*  ALKPHOS 90 86  BILITOT 0.9 0.7  PROT 6.3* 5.5*  ALBUMIN 3.6 3.1*    Recent Labs Lab 01/08/15 1902  LIPASE 11*   No results for input(s): AMMONIA in the last 168 hours. CBC:  Recent Labs Lab 01/08/15 1902 01/09/15 0437 01/10/15 0630 01/11/15 0250  WBC 7.0 6.4 4.2 5.2  NEUTROABS 4.8  --  2.8  --   HGB 9.9* 8.3* 8.5* 8.2*  HCT 31.8* 27.3* 28.3* 27.1*  MCV 94.4 95.8 96.9 96.4  PLT 287 255 234 228   Cardiac Enzymes: No results for input(s): CKTOTAL, CKMB, CKMBINDEX, TROPONINI in the last 168 hours. BNP (last 3 results) No results for input(s): BNP in the last 8760 hours.  ProBNP (last 3 results) No results for input(s): PROBNP in the last 8760 hours.  CBG: No results for input(s): GLUCAP in the last 168 hours.  No results found for  this or any previous visit (from the past 240 hour(s)).   Studies: Dg Abd 2 Views  01/10/2015   CLINICAL DATA:  Patient with 1 month of abdominal pain.  EXAM: ABDOMEN - 2 VIEW  COMPARISON:  01/09/2015  FINDINGS: Multiple gaseous distended loops of small bowel are demonstrated within the central abdomen measuring up to 4 cm. Differential air-fluid levels are demonstrated on the upright imaging. No free intraperitoneal air. Minimal atelectasis left lung base. Pelvic surgical clips. Regional skeleton unremarkable. Contrast material in the distal colon.  IMPRESSION: Differential air-fluid levels on upright imaging most compatible with small bowel obstruction. Contrast is demonstrated within the distal colon therefore this may  represent a partial obstruction.   Electronically Signed   By: Lovey Newcomer M.D.   On: 01/10/2015 08:37   Dg Abd Portable 1v  01/11/2015   CLINICAL DATA:  Followup small bowel obstruction.  Hx of adenocarcinoma small intestine (dx 10/18/2013), abdominal hysterectomy (10/18/2013).Right marker was accidentally collimated off bottom right corner. Left (L) marker was annotated on portable  EXAM: PORTABLE ABDOMEN - 1 VIEW  COMPARISON:  01/10/2015  FINDINGS: Mildly dilated central small bowel appreciated, similar to the previous day's study. Air is seen within a normal caliber colon and in the rectum.  There surgical clips the pelvis, stable. Soft tissues otherwise unremarkable.  IMPRESSION: Persistent partial small bowel obstruction. No significant change from the previous day's study.   Electronically Signed   By: Lajean Manes M.D.   On: 01/11/2015 09:23    Scheduled Meds: . sodium chloride  10-40 mL Intracatheter Q12H   Continuous Infusions: . sodium chloride 75 mL/hr at 01/11/15 1023  . heparin 950 Units/hr (01/11/15 1313)    Active Problems:   PE (pulmonary embolism)   Anticoagulated on Coumadin   Gastric cancer   Nausea with vomiting   SBO (small bowel obstruction)   Zeph Riebel, [facility] Pager [phone]. If 7PM-7AM, please contact night-coverage at [email or web address], password [facility] 01/11/2015, 3:46 PM  LOS: 2 days

## 2015-01-11 NOTE — Progress Notes (Signed)
[facility] Progress Note     Subjective: No new complaints, still has some epigastric pain  Objective: Vital signs in last 24 hours: Temp:  [98.1 F (36.7 C)-99.1 F (37.3 C)] 98.7 F (37.1 C) (05/28 0600) Pulse Rate:  [73-83] 80 (05/28 0600) Resp:  [12-20] 20 (05/28 0600) BP: (132-147)/(74-105) 140/77 mmHg (05/28 0600) SpO2:  [96 %-100 %] 96 % (05/28 0600) Last BM Date:  (prior to admission)  Intake/Output from previous day: 01-18-2023 0701 - 05/28 0700 In: 1810 [I.V.:1810] Out: 0  Intake/Output this shift:    PE: Gen:  Alert, NAD, pleasant Abd: Soft, mild distension, tender in the epigastrium   Lab Results:   Recent Labs  18-Jan-2015 0630 01/11/15 0250  WBC 4.2 5.2  HGB 8.5* 8.2*  HCT 28.3* 27.1*  PLT 234 228   BMET  Recent Labs  01/09/15 0437 2015-01-18 0630  NA 138 139  K 3.4* 3.7  CL 101 105  CO2 30 26  GLUCOSE 109* 79  BUN 10 8  CREATININE 0.57 0.49  CALCIUM 8.1* 8.1*   PT/INR  Recent Labs  2015/01/18 0630 01-18-15 1315  LABPROT 23.0* 21.8*  INR 2.05* 1.91*   CMP     Component Value Date/Time   NA 139 2015-01-18 0630   NA 140 12/31/2014 1030   K 3.7 01/18/2015 0630   K 3.7 12/31/2014 1030   CL 105 01/18/2015 0630   CO2 26 01-18-15 0630   CO2 25 12/31/2014 1030   GLUCOSE 79 18-Jan-2015 0630   GLUCOSE 95 12/31/2014 1030   BUN 8 2015-01-18 0630   BUN 8.5 12/31/2014 1030   CREATININE 0.49 Jan 18, 2015 0630   CREATININE 0.5* 12/31/2014 1030   CREATININE 0.61 05/17/2014 1515   CALCIUM 8.1* 01/18/2015 0630   CALCIUM 8.7 12/31/2014 1030   PROT 5.5* 01/09/2015 0437   PROT 6.2* 12/31/2014 1030   ALBUMIN 3.1* 01/09/2015 0437   ALBUMIN 3.5 12/31/2014 1030   AST 25 01/09/2015 0437   AST 13 12/31/2014 1030   ALT 11* 01/09/2015 0437   ALT 8 12/31/2014 1030   ALKPHOS 86 01/09/2015 0437   ALKPHOS 94 12/31/2014 1030   BILITOT 0.7 01/09/2015 0437   BILITOT 0.65 12/31/2014 1030   GFRNONAA >60 2015/01/18 0630   GFRNONAA >89  05/17/2014 1515   GFRAA >60 01-18-15 0630   GFRAA >89 05/17/2014 1515   Lipase     Component Value Date/Time   LIPASE 11* 01/08/2015 1902       Studies/Results: Dg Abd 2 Views  01-18-15   CLINICAL DATA:  Patient with 1 month of abdominal pain.  EXAM: ABDOMEN - 2 VIEW  COMPARISON:  01/09/2015  FINDINGS: Multiple gaseous distended loops of small bowel are demonstrated within the central abdomen measuring up to 4 cm. Differential air-fluid levels are demonstrated on the upright imaging. No free intraperitoneal air. Minimal atelectasis left lung base. Pelvic surgical clips. Regional skeleton unremarkable. Contrast material in the distal colon.  IMPRESSION: Differential air-fluid levels on upright imaging most compatible with small bowel obstruction. Contrast is demonstrated within the distal colon therefore this may represent a partial obstruction.   Electronically Signed   By: Lovey Newcomer M.D.   On: 18-Jan-2015 08:37   Dg Abd 2 Views  01/09/2015   CLINICAL DATA:  Gastric cancer, abdominal pain, nausea and vomiting.  EXAM: ABDOMEN - 2 VIEW  COMPARISON:  CT abdomen/pelvis 01/08/2015  FINDINGS: Multiple differential air-fluid levels are identified, predominantly colon, although a mildly dilated mid abdominal small  bowel loop is identified measuring at least 3.3 cm. Retained contrast within moderately dilated right renal collecting system and right ureter. Pelvic clips are noted. No free air.  IMPRESSION: Multiple air-fluid levels suggesting mid to distal small bowel obstruction as seen previously. Contrast does reach the colon therefore an element of partial obstruction or ileus could appear similar.  Right hydroureteronephrosis reidentified.   Electronically Signed   By: Conchita Paris M.D.   On: 01/09/2015 09:24    Anti-infectives: Anti-infectives    None       Assessment/Plan SBO -Metastatic gastric cancer Stage IV. S/p bilateral oophorectomy, total abdominal hysterectomy,  omentectomy, resection of abdominal wall and pelvic masses, bilateral pelvic lymphadenectomy 10/18/2013 with the pathology confirming moderate to poorly differentiated adenocarcinoma -Stomach biopsy 11/28/2013 revealed adenocarcinoma -DG abdomen this am shows pSBO, but air to colon and contrast in rectum now, the most appropriate treatment is medical management.  She still has a couple dilated loops of small bowel. -There is no evidence of compromised bowel or indication of urgent surgical complication. -Bowel rest except ice chips, IV hydration, NG tube if vomits -Laparotomy will be reserved for failure to resolve her SBO, and only as a last resort. -Holding Coumadin to allow INR to normalize. On heparin in the interim. -Ambulate and IS   Hx of PE now on coumadin therapy at home Chronic abdominal pain secondary to carcinomatosis and gastric tumor -Status post 12 cycles of FOLFOX, last given 04/25/2014 Neutropenia related to chemotherapy Anemia DVT: INR 1.9/SCD Antibiotics: None    LOS: 2 days    Alicia [name] C. 0/25/4270, 6:23 AM

## 2015-01-12 ENCOUNTER — Other Ambulatory Visit: Payer: Self-pay | Admitting: Oncology

## 2015-01-12 LAB — CBC
HCT: 27.3 % — ABNORMAL LOW (ref 36.0–46.0)
Hemoglobin: 8.3 g/dL — ABNORMAL LOW (ref 12.0–15.0)
MCH: 29 pg (ref 26.0–34.0)
MCHC: 30.4 g/dL (ref 30.0–36.0)
MCV: 95.5 fL (ref 78.0–100.0)
Platelets: 237 10*3/uL (ref 150–400)
RBC: 2.86 MIL/uL — AB (ref 3.87–5.11)
RDW: 17.3 % — ABNORMAL HIGH (ref 11.5–15.5)
WBC: 7.1 10*3/uL (ref 4.0–10.5)

## 2015-01-12 LAB — PROTIME-INR
INR: 1.89 — AB (ref 0.00–1.49)
Prothrombin Time: 21.6 seconds — ABNORMAL HIGH (ref 11.6–15.2)

## 2015-01-12 LAB — HEPARIN LEVEL (UNFRACTIONATED): Heparin Unfractionated: 0.41 IU/mL (ref 0.30–0.70)

## 2015-01-12 NOTE — Progress Notes (Signed)
[facility] Progress Note     Subjective: No new complaints  Objective: Vital signs in last 24 hours: Temp:  [98.5 F (36.9 C)-99 F (37.2 C)] 98.5 F (36.9 C) (05/29 0617) Pulse Rate:  [78-84] 78 (05/29 0617) Resp:  [16-18] 18 (05/29 0617) BP: (148-165)/(68-86) 162/68 mmHg (05/29 0617) SpO2:  [99 %] 99 % (05/29 0617) Last BM Date:  (prior to admission)  Intake/Output from previous day: 05/28 0701 - 05/29 0700 In: 120 [P.O.:120] Out: 1300 [Urine:1300] Intake/Output this shift:    PE: Gen:  Alert, NAD, pleasant Abd: Soft, mild distension, tender in the epigastrium   Lab Results:   Recent Labs  01/11/15 0250 01/12/15 0600  WBC 5.2 7.1  HGB 8.2* 8.3*  HCT 27.1* 27.3*  PLT 228 237   BMET  Recent Labs  01/10/15 0630  NA 139  K 3.7  CL 105  CO2 26  GLUCOSE 79  BUN 8  CREATININE 0.49  CALCIUM 8.1*   PT/INR  Recent Labs  01/10/15 1315 01/12/15 0600  LABPROT 21.8* 21.6*  INR 1.91* 1.89*   CMP     Component Value Date/Time   NA 139 01/10/2015 0630   NA 140 12/31/2014 1030   K 3.7 01/10/2015 0630   K 3.7 12/31/2014 1030   CL 105 01/10/2015 0630   CO2 26 01/10/2015 0630   CO2 25 12/31/2014 1030   GLUCOSE 79 01/10/2015 0630   GLUCOSE 95 12/31/2014 1030   BUN 8 01/10/2015 0630   BUN 8.5 12/31/2014 1030   CREATININE 0.49 01/10/2015 0630   CREATININE 0.5* 12/31/2014 1030   CREATININE 0.61 05/17/2014 1515   CALCIUM 8.1* 01/10/2015 0630   CALCIUM 8.7 12/31/2014 1030   PROT 5.5* 01/09/2015 0437   PROT 6.2* 12/31/2014 1030   ALBUMIN 3.1* 01/09/2015 0437   ALBUMIN 3.5 12/31/2014 1030   AST 25 01/09/2015 0437   AST 13 12/31/2014 1030   ALT 11* 01/09/2015 0437   ALT 8 12/31/2014 1030   ALKPHOS 86 01/09/2015 0437   ALKPHOS 94 12/31/2014 1030   BILITOT 0.7 01/09/2015 0437   BILITOT 0.65 12/31/2014 1030   GFRNONAA >60 01/10/2015 0630   GFRNONAA >89 05/17/2014 1515   GFRAA >60 01/10/2015 0630   GFRAA >89 05/17/2014 1515   Lipase      Component Value Date/Time   LIPASE 11* 01/08/2015 1902       Studies/Results: Dg Abd Portable 1v  01/11/2015   CLINICAL DATA:  Followup small bowel obstruction.  Hx of adenocarcinoma small intestine (dx 10/18/2013), abdominal hysterectomy (10/18/2013).Right marker was accidentally collimated off bottom right corner. Left (L) marker was annotated on portable  EXAM: PORTABLE ABDOMEN - 1 VIEW  COMPARISON:  01/10/2015  FINDINGS: Mildly dilated central small bowel appreciated, similar to the previous day's study. Air is seen within a normal caliber colon and in the rectum.  There surgical clips the pelvis, stable. Soft tissues otherwise unremarkable.  IMPRESSION: Persistent partial small bowel obstruction. No significant change from the previous day's study.   Electronically Signed   By: Lajean Manes M.D.   On: 01/11/2015 09:23    Anti-infectives: Anti-infectives    None       Assessment/Plan SBO -Metastatic gastric cancer Stage IV. S/p bilateral oophorectomy, total abdominal hysterectomy, omentectomy, resection of abdominal wall and pelvic masses, bilateral pelvic lymphadenectomy 10/18/2013 with the pathology confirming moderate to poorly differentiated adenocarcinoma -Stomach biopsy 11/28/2013 revealed adenocarcinoma -There is no evidence of compromised bowel or indication of urgent surgical complication. -  AXR shows persistent pSBO  -Bowel rest except ice chips, IV hydration, NG tube if vomits.     Not sure if any progress is being made. Consider either starting clears to see how she tolerates this or inserting NG to help resolve small bowel distention -Laparotomy will be reserved for failure to resolve her SBO, and only as a last resort. -Holding Coumadin to allow INR to normalize. On heparin in the interim. -Ambulate and IS   Hx of PE now on coumadin therapy at home Chronic abdominal pain secondary to carcinomatosis and gastric tumor -Status post 12 cycles of FOLFOX, last given  04/25/2014 Neutropenia related to chemotherapy Anemia DVT: INR 1.9/SCD Antibiotics: None    LOS: 3 days    Jupiter Boys C. 6/46/8032, 1:22 AM

## 2015-01-12 NOTE — Progress Notes (Signed)
ANTICOAGULATION CONSULT NOTE - Follow Up Consult  Pharmacy Consult for Heparin Indication: hx of PE, warfarin bridging  Allergies  Allergen Reactions  . Xarelto [Rivaroxaban] Nausea And Vomiting, Palpitations and Other (See Comments)    Headaches and fatigue, numbness in hands/feet and vomitting    Patient Measurements: Height: 5' (152.4 cm) Weight: 166 lb 9.6 oz (75.569 kg) IBW/kg (Calculated) : 45.5 Heparin Dosing Weight: 62 kg  Vital Signs: Temp: 98.5 F (36.9 C) (05/29 0617) Temp Source: Oral (05/29 0617) BP: 162/68 mmHg (05/29 0617) Pulse Rate: 78 (05/29 0617)  Labs:  Recent Labs  01/09/15 1350  01/10/15 0630 01/10/15 1315  01/11/15 0250 01/11/15 1014 01/11/15 1915 01/12/15 0600  HGB  --   < > 8.5*  --   --  8.2*  --   --  8.3*  HCT  --   --  28.3*  --   --  27.1*  --   --  27.3*  PLT  --   --  234  --   --  228  --   --  237  APTT 37  --   --   --   --   --   --   --   --   LABPROT 25.1*  --  23.0* 21.8*  --   --   --   --   --   INR 2.31*  --  2.05* 1.91*  --   --   --   --   --   HEPARINUNFRC  --   --   --   --   < > 0.29* 0.77* 0.51 0.41  CREATININE  --   --  0.49  --   --   --   --   --   --   < > = values in this interval not displayed.  Estimated Creatinine Clearance: 72.1 mL/min (by C-G formula based on Cr of 0.49).   Assessment: 49 yoF on chronic warfarin therapy PTA for history of PE admitted for SBO.  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  Plan is for medical management for now per surgery.  Holding warfarin to allow INR to normalize in case surgery becomes necessary and will start heparin for bridge once INR<2.  Last warfarin dose 5/24.  Hgb low but stable.   Today, 01/12/2015  Heparin level therapeutic  x 2 now on 950 units/hr  Hgb low but stable, Plts WNL  No reported problems per RN or noted bleeding  Goal of Therapy:  Heparin level 0.3-0.7 units/ml Monitor platelets by anticoagulation protocol: Yes   Plan:  1) Continue IV heparin rate at 950 units/hr 2) Daily INR, heparin level and CBC   Adrian Saran, PharmD, BCPS Pager [phone] 01/12/2015 7:55 AM

## 2015-01-12 NOTE — Progress Notes (Signed)
Patient started on clear liquids to see how she tolerates. Will continue to monitor.

## 2015-01-12 NOTE — Progress Notes (Signed)
[facility] PROGRESS NOTE  Pamila Mendibles JYN:[id number] DOB: 1960-10-08 DOA: 01/08/2015 PCP: Minerva Ends, MD  Assessment/Plan: 1. SBO 1. General Surgery is following, appreciate input 2. Xray on 5/28 without significant improvement although patient reports passing flatus with good bowel sounds on exam 3. Will cautiously start on clears per Surgery recs. Pt to resume NPO status if unable to tolerate clears. 2. Gastric Cancer 1. Followed by Oncology - Dr. Benay Spice 3. Hypokalemia 1. Replaced 2. Continue to follow lytes 4. Moderate protein calorie malnutrition 1. Nutrition following 2. Start clears per above 5. Hx PE 1. Coumadin on hold, but pt is continued on heparin gtt given hypercoagulable state 6. DVT prophylaxis 1. Cont on Heparin gtt  Code Status: Full Family Communication: Pt in room Disposition Plan: Pending   Consultants:  General Surgery  Procedures:    Antibiotics:   (indicate start date, and stop date if known)  HPI/Subjective: Through translator app, patient reports abd pain persists but is "little."  Objective: Filed Vitals:   01/11/15 1539 01/11/15 2143 01/12/15 0617 01/12/15 1400  BP: 151/86 148/78 162/68 130/68  Pulse: 79 80 78 84  Temp:  98.5 F (36.9 C) 98.5 F (36.9 C) 98.3 F (36.8 C)  TempSrc:  Oral Oral Oral  Resp:  18 18 18   Height:      Weight:      SpO2:  99% 99% 98%    Intake/Output Summary (Last 24 hours) at 01/12/15 1746 Last data filed at 01/12/15 0500  Gross per 24 hour  Intake      0 ml  Output    800 ml  Net   -800 ml   Filed Weights   01/09/15 0213  Weight: 75.569 kg (166 lb 9.6 oz)    Exam:   General:  Asleep, easily arousable, in nad  Cardiovascular: regular, s1, s2  Respiratory: normal resp effort, no wheezing  Abdomen: soft, less tender over epigastric region, mildly distended, positive BS  Musculoskeletal: perfused, no clubbing, no cyanosis  Data Reviewed: Basic Metabolic  Panel:  Recent Labs Lab 01/08/15 1902 01/09/15 0437 01/10/15 0630  NA 137 138 139  K 3.1* 3.4* 3.7  CL 100* 101 105  CO2 28 30 26   GLUCOSE 112* 109* 79  BUN 9 10 8   CREATININE 0.55 0.57 0.49  CALCIUM 8.6* 8.1* 8.1*  MG  --  2.3  --   PHOS  --  5.1*  --    Liver Function Tests:  Recent Labs Lab 01/08/15 1902 01/09/15 0437  AST 15 25  ALT 9* 11*  ALKPHOS 90 86  BILITOT 0.9 0.7  PROT 6.3* 5.5*  ALBUMIN 3.6 3.1*    Recent Labs Lab 01/08/15 1902  LIPASE 11*   No results for input(s): AMMONIA in the last 168 hours. CBC:  Recent Labs Lab 01/08/15 1902 01/09/15 0437 01/10/15 0630 01/11/15 0250 01/12/15 0600  WBC 7.0 6.4 4.2 5.2 7.1  NEUTROABS 4.8  --  2.8  --   --   HGB 9.9* 8.3* 8.5* 8.2* 8.3*  HCT 31.8* 27.3* 28.3* 27.1* 27.3*  MCV 94.4 95.8 96.9 96.4 95.5  PLT 287 255 234 228 237   Cardiac Enzymes: No results for input(s): CKTOTAL, CKMB, CKMBINDEX, TROPONINI in the last 168 hours. BNP (last 3 results) No results for input(s): BNP in the last 8760 hours.  ProBNP (last 3 results) No results for input(s): PROBNP in the last 8760 hours.  CBG: No results for input(s): GLUCAP in the last 168 hours.  No results found for this or any previous visit (from the past 240 hour(s)).   Studies: Dg Abd Portable 1v  01/11/2015   CLINICAL DATA:  Followup small bowel obstruction.  Hx of adenocarcinoma small intestine (dx 10/18/2013), abdominal hysterectomy (10/18/2013).Right marker was accidentally collimated off bottom right corner. Left (L) marker was annotated on portable  EXAM: PORTABLE ABDOMEN - 1 VIEW  COMPARISON:  01/10/2015  FINDINGS: Mildly dilated central small bowel appreciated, similar to the previous day's study. Air is seen within a normal caliber colon and in the rectum.  There surgical clips the pelvis, stable. Soft tissues otherwise unremarkable.  IMPRESSION: Persistent partial small bowel obstruction. No significant change from the previous day's study.    Electronically Signed   By: Lajean Manes M.D.   On: 01/11/2015 09:23    Scheduled Meds: . sodium chloride  10-40 mL Intracatheter Q12H   Continuous Infusions: . sodium chloride 75 mL/hr at 01/12/15 1152  . heparin 950 Units/hr (01/11/15 1647)    Active Problems:   PE (pulmonary embolism)   Anticoagulated on Coumadin   Gastric cancer   Nausea with vomiting   SBO (small bowel obstruction)   Bonna Steury, [place] [facility] Pager [phone]. If 7PM-7AM, please contact night-coverage at [email or web address], password [facility] 01/12/2015, 5:46 PM  LOS: 3 days

## 2015-01-13 LAB — CBC
HEMATOCRIT: 26.6 % — AB (ref 36.0–46.0)
Hemoglobin: 8.1 g/dL — ABNORMAL LOW (ref 12.0–15.0)
MCH: 29 pg (ref 26.0–34.0)
MCHC: 30.5 g/dL (ref 30.0–36.0)
MCV: 95.3 fL (ref 78.0–100.0)
Platelets: 242 10*3/uL (ref 150–400)
RBC: 2.79 MIL/uL — ABNORMAL LOW (ref 3.87–5.11)
RDW: 17.9 % — AB (ref 11.5–15.5)
WBC: 6.7 10*3/uL (ref 4.0–10.5)

## 2015-01-13 LAB — PROTIME-INR
INR: 1.86 — AB (ref 0.00–1.49)
PROTHROMBIN TIME: 21.4 s — AB (ref 11.6–15.2)

## 2015-01-13 LAB — HEPARIN LEVEL (UNFRACTIONATED): Heparin Unfractionated: 0.41 IU/mL (ref 0.30–0.70)

## 2015-01-13 MED ORDER — OXYCODONE HCL 5 MG PO TABS
5.0000 mg | ORAL_TABLET | ORAL | Status: DC | PRN
  Administered 2015-01-13 (×3): 10 mg via ORAL
  Filled 2015-01-13 (×4): qty 2

## 2015-01-13 NOTE — Progress Notes (Signed)
IP PROGRESS NOTE  Subjective:   She denies nausea. She had a bowel movement yesterday and this morning. She continues to have pain in the mid abdomen.    Objective: Vital signs in last 24 hours: Blood pressure 165/87, pulse 89, temperature 98.7 F (37.1 C), temperature source Oral, resp. rate 16, height 5' (1.524 m), weight 166 lb 9.6 oz (75.569 kg), SpO2 97 %.  Intake/Output from previous day: 05/29 0701 - 05/30 0700 In: 1025 [I.V.:1025] Out: 250 [Urine:250]  Physical Exam:   Abdomen: Soft, tender in the mid abdomen, periumbilical masslike fullness. Extremities: No leg edema   Portacath/PICC-without erythema  Lab Results:  Recent Labs  01/12/15 0600 01/13/15 0545  WBC 7.1 6.7  HGB 8.3* 8.1*  HCT 27.3* 26.6*  PLT 237 242    BMET No results for input(s): NA, K, CL, CO2, GLUCOSE, BUN, CREATININE, CALCIUM in the last 72 hours.  Studies/Results: No results found.  Medications: I have reviewed the patient's current medications.  Assessment/Plan: 1. Stage IV gastric cancer  Status post bilateral oophorectomy, total abdominal hysterectomy, omentectomy, resection of abdominal wall and pelvic masses, bilateral pelvic lymphadenectomy 10/18/2013 with the pathology confirming moderate to poorly differentiated adenocarcinoma  Stomach biopsy 11/28/2013 revealed adenocarcinoma  Status post 12 cycles of FOLFOX, last given 04/25/2014  Restaging CT 04/29/2014 with thickening of the stomach and nodular densities adjacent to the greater curvature-unchanged, faint groundglass nodularity in the lungs-nonspecific  Restaging CT 07/22/2014 with indeterminate lung nodules; moderate wall thickening in the body region of the stomach; fairly extensive omental disease and findings suspicious for abdominal wall and incisional disease.  Started systemic chemotherapy with FOLFIRI on 07/30/14.  Restaging CT evaluation 10/14/2014 following 5 cycles of FOLFIRI showed decreased nodularity in  both lungs; overall stable omental nodularity; involution of the previously demonstrated ill-defined soft tissue mass involving the anterior abdominal wall incision.  Cycle 6 FOLFIRI 10/15/2014  Cycle 7 FOLFIRI 10/29/2014  Cycle 8 FOLFIRI 11/12/2014  Cycle 9 FOLFIRI 11/26/2014 (dose reduction of 5-fluorouracil due to mucositis)  CT abdomen/pelvis 12/05/2014 revealed mild progression of peritoneal/omental tumor  Initiation of weekly Taxol (3 weeks on/1 week off) and every 2 week Ramucirumab 12/17/2014   2. Oxaliplatin neuropathy   3. Bilateral upper and lower lobe pulmonary emboli within segmental branches-now maintained on Coumadin. Followed by Coumadin clinic at the [facility]   4. Neutropenia related to chemotherapy 08/13/2014. Cycle 2 FOLFIRI held. Neulasta added beginning with cycle 2 on 08/20/2014.   5. Nausea following chemotherapy-the anti-emetic regimen was adjusted with cycle 4 FOLFIRI 09/17/2014   6. Diarrhea following FOLFIRI-no improvement with Imodium. Lomotil initiated 10/15/2014.    7. Mouth sores following cycle 8 FOLFIRI. 5 fluorouracil dose reduced.   8. Admission 12/05/2014 with nausea/vomiting and diarrhea-likely secondary to FOLFIRI chemotherapy   9. Abdominal pain secondary to carcinomatosis and gastric tumor   10. 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  Admission 01/09/2015 with abdominal pain and nausea/vomiting-a CT confirmed a distal small bowel obstruction.   She is tolerating liquids and having bowel movements. She continues to have abdominal pain secondary to carcinomatosis.  Recommendations: 1. Diet per recommendations of surgery 2. Narcotic analgesics for pain, I will add oxycodone 3. Resume Coumadin anticoagulation at discharge 4. She can follow-up at the Cancer center 01/14/2015 as scheduled. I will see her if she remains in the hospital.   LOS: 4 days   [place]  01/13/2015,  8:33 AM

## 2015-01-13 NOTE — Progress Notes (Signed)
[facility] Progress Note     Subjective: No new complaints, had a BM yesterday  Objective: Vital signs in last 24 hours: Temp:  [98.3 F (36.8 C)-98.7 F (37.1 C)] 98.7 F (37.1 C) (05/30 0536) Pulse Rate:  [78-89] 89 (05/30 0536) Resp:  [16-18] 16 (05/30 0536) BP: (128-165)/(68-87) 165/87 mmHg (05/30 0536) SpO2:  [97 %-98 %] 97 % (05/30 0536) Last BM Date: 01/13/15  Intake/Output from previous day: 05/29 0701 - 05/30 0700 In: 1025 [I.V.:1025] Out: 250 [Urine:250] Intake/Output this shift:    PE: Gen:  Alert, NAD, pleasant Abd: Soft, mild distension, tender in the epigastrium   Lab Results:   Recent Labs  01/12/15 0600 01/13/15 0545  WBC 7.1 6.7  HGB 8.3* 8.1*  HCT 27.3* 26.6*  PLT 237 242   BMET No results for input(s): NA, K, CL, CO2, GLUCOSE, BUN, CREATININE, CALCIUM in the last 72 hours. PT/INR  Recent Labs  01/12/15 0600 01/13/15 0551  LABPROT 21.6* 21.4*  INR 1.89* 1.86*   CMP     Component Value Date/Time   NA 139 01/10/2015 0630   NA 140 12/31/2014 1030   K 3.7 01/10/2015 0630   K 3.7 12/31/2014 1030   CL 105 01/10/2015 0630   CO2 26 01/10/2015 0630   CO2 25 12/31/2014 1030   GLUCOSE 79 01/10/2015 0630   GLUCOSE 95 12/31/2014 1030   BUN 8 01/10/2015 0630   BUN 8.5 12/31/2014 1030   CREATININE 0.49 01/10/2015 0630   CREATININE 0.5* 12/31/2014 1030   CREATININE 0.61 05/17/2014 1515   CALCIUM 8.1* 01/10/2015 0630   CALCIUM 8.7 12/31/2014 1030   PROT 5.5* 01/09/2015 0437   PROT 6.2* 12/31/2014 1030   ALBUMIN 3.1* 01/09/2015 0437   ALBUMIN 3.5 12/31/2014 1030   AST 25 01/09/2015 0437   AST 13 12/31/2014 1030   ALT 11* 01/09/2015 0437   ALT 8 12/31/2014 1030   ALKPHOS 86 01/09/2015 0437   ALKPHOS 94 12/31/2014 1030   BILITOT 0.7 01/09/2015 0437   BILITOT 0.65 12/31/2014 1030   GFRNONAA >60 01/10/2015 0630   GFRNONAA >89 05/17/2014 1515   GFRAA >60 01/10/2015 0630   GFRAA >89 05/17/2014 1515   Lipase      Component Value Date/Time   LIPASE 11* 01/08/2015 1902       Studies/Results: No results found.  Anti-infectives: Anti-infectives    None       Assessment/Plan SBO -Metastatic gastric cancer Stage IV. S/p bilateral oophorectomy, total abdominal hysterectomy, omentectomy, resection of abdominal wall and pelvic masses, bilateral pelvic lymphadenectomy 10/18/2013 with the pathology confirming moderate to poorly differentiated adenocarcinoma -Stomach biopsy 11/28/2013 revealed adenocarcinoma -There is no evidence of compromised bowel or indication of urgent surgical complication. -AXR shows persistent pSBO  -clears, advance diet as tolerated    -Laparotomy will be reserved for failure to resolve her SBO, and only as a last resort. -Holding Coumadin to allow INR to normalize. On heparin in the interim. -Ambulate and IS   Hx of PE now on coumadin therapy at home Chronic abdominal pain secondary to carcinomatosis and gastric tumor -Status post 12 cycles of FOLFOX, last given 04/25/2014 Neutropenia related to chemotherapy Anemia DVT: INR 1.9/SCD Antibiotics: None    LOS: 4 days    Teri Diltz C. 5/40/0867, 6:19 AM

## 2015-01-13 NOTE — Progress Notes (Signed)
ANTICOAGULATION CONSULT NOTE - Follow Up Consult  Pharmacy Consult for Heparin Indication: hx of PE, warfarin bridging  Allergies  Allergen Reactions  . Xarelto [Rivaroxaban] Nausea And Vomiting, Palpitations and Other (See Comments)    Headaches and fatigue, numbness in hands/feet and vomitting    Patient Measurements: Height: 5' (152.4 cm) Weight: 166 lb 9.6 oz (75.569 kg) IBW/kg (Calculated) : 45.5 Heparin Dosing Weight: 62 kg  Vital Signs: Temp: 98.7 F (37.1 C) (05/30 0536) Temp Source: Oral (05/30 0536) BP: 165/87 mmHg (05/30 0536) Pulse Rate: 89 (05/30 0536)  Labs:  Recent Labs  01/10/15 1315  01/11/15 0250  01/11/15 1915 01/12/15 0600 01/13/15 0545 01/13/15 0551  HGB  --   < > 8.2*  --   --  8.3* 8.1*  --   HCT  --   --  27.1*  --   --  27.3* 26.6*  --   PLT  --   --  228  --   --  237 242  --   LABPROT 21.8*  --   --   --   --  21.6*  --  21.4*  INR 1.91*  --   --   --   --  1.89*  --  1.86*  HEPARINUNFRC  --   --  0.29*  < > 0.51 0.41 0.41  --   < > = values in this interval not displayed.  Estimated Creatinine Clearance: 72.1 mL/min (by C-G formula based on Cr of 0.49).   Assessment: 59 yoF on chronic warfarin therapy PTA for history of PE admitted for SBO.  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  Plan is for medical management for now per surgery.  Holding warfarin to allow INR to normalize in case surgery becomes necessary and will start heparin for bridge once INR<2.  Last warfarin dose 5/24.  Hgb low but stable.   Today, 01/13/2015  Heparin level therapeutic on 950 units/hr  Hgb low but stable, Plts WNL  No bleeding documented  Goal of Therapy:  Heparin level 0.3-0.7 units/ml Monitor platelets by  anticoagulation protocol: Yes   Plan:  1) Continue IV heparin rate at 950 units/hr 2) Daily INR, heparin level and CBC   Dia Sitter, PharmD, BCPS 01/13/2015 7:35 AM

## 2015-01-13 NOTE — Progress Notes (Signed)
[facility] PROGRESS NOTE  Alicia [name] OBS:[id number] DOB: 10-10-1960 DOA: 01/08/2015 PCP: Minerva Ends, MD  Assessment/Plan: 1. SBO 1. General Surgery following. Recs to continue to advance diet as tolerated 2. Xray on 5/28 without significant improvemen 3. Patient reports continued flatus and bowel movements 4. She is tolerating clears and is eager and willing to advance to a full liquid diet. Will order 2. Gastric Cancer 1. Followed by Oncology - Dr. Benay Spice 2. Pain med adjustment noted for abd pain related to malignancy 3. Pt has scheduled appt on 5/31 with Dr. Benay Spice 3. Hypokalemia 1. Replaced 2. Continue to follow lytes 4. Moderate protein calorie malnutrition 1. Nutrition following 2. Advancing diet per above 5. Hx PE 1. Coumadin currently on hold, but pt is continued on heparin gtt given hypercoagulable state 2. Oncology recs to resume Coumadin when OK with Surgery 6. DVT prophylaxis 1. Cont on Heparin gtt  Code Status: Full Family Communication: Pt in room Disposition Plan: Pending   Consultants:  General Surgery  Oncology - Dr. Benay Spice  Procedures:    Antibiotics:  None  (indicate start date, and stop date if known)  HPI/Subjective: Through translator app, patient tolerating clear liquid and is eager to advance to full liquid diet. She has requested prescriptions for analgesics on discharge  Objective: Filed Vitals:   01/12/15 1400 01/12/15 2204 01/13/15 0536 01/13/15 1512  BP: 130/68 128/70 165/87 157/95  Pulse: 84 78 89 90  Temp: 98.3 F (36.8 C) 98.5 F (36.9 C) 98.7 F (37.1 C) 99 F (37.2 C)  TempSrc: Oral Oral Oral   Resp: 18 18 16 16   Height:      Weight:      SpO2: 98% 97% 97% 97%    Intake/Output Summary (Last 24 hours) at 01/13/15 1751 Last data filed at 01/13/15 1513  Gross per 24 hour  Intake   3328 ml  Output    250 ml  Net   3078 ml   Filed Weights   01/09/15 0213  Weight: 75.569 kg (166 lb 9.6 oz)     Exam:   General:  Awake, laying in bed, in nad  Cardiovascular: regular, s1, s2  Respiratory: normal resp effort, no wheezing  Abdomen: soft, improved distention, positive BS  Musculoskeletal: perfused, no cyanosis  Data Reviewed: Basic Metabolic Panel:  Recent Labs Lab 01/08/15 1902 01/09/15 0437 01/10/15 0630  NA 137 138 139  K 3.1* 3.4* 3.7  CL 100* 101 105  CO2 28 30 26   GLUCOSE 112* 109* 79  BUN 9 10 8   CREATININE 0.55 0.57 0.49  CALCIUM 8.6* 8.1* 8.1*  MG  --  2.3  --   PHOS  --  5.1*  --    Liver Function Tests:  Recent Labs Lab 01/08/15 1902 01/09/15 0437  AST 15 25  ALT 9* 11*  ALKPHOS 90 86  BILITOT 0.9 0.7  PROT 6.3* 5.5*  ALBUMIN 3.6 3.1*    Recent Labs Lab 01/08/15 1902  LIPASE 11*   No results for input(s): AMMONIA in the last 168 hours. CBC:  Recent Labs Lab 01/08/15 1902 01/09/15 0437 01/10/15 0630 01/11/15 0250 01/12/15 0600 01/13/15 0545  WBC 7.0 6.4 4.2 5.2 7.1 6.7  NEUTROABS 4.8  --  2.8  --   --   --   HGB 9.9* 8.3* 8.5* 8.2* 8.3* 8.1*  HCT 31.8* 27.3* 28.3* 27.1* 27.3* 26.6*  MCV 94.4 95.8 96.9 96.4 95.5 95.3  PLT 287 255 234 228 237 242  Cardiac Enzymes: No results for input(s): CKTOTAL, CKMB, CKMBINDEX, TROPONINI in the last 168 hours. BNP (last 3 results) No results for input(s): BNP in the last 8760 hours.  ProBNP (last 3 results) No results for input(s): PROBNP in the last 8760 hours.  CBG: No results for input(s): GLUCAP in the last 168 hours.  No results found for this or any previous visit (from the past 240 hour(s)).   Studies: No results found.  Scheduled Meds: . sodium chloride  10-40 mL Intracatheter Q12H   Continuous Infusions: . sodium chloride 75 mL/hr at 01/13/15 1344  . heparin 950 Units/hr (01/12/15 1947)    Active Problems:   PE (pulmonary embolism)   Anticoagulated on Coumadin   Gastric cancer   Nausea with vomiting   SBO (small bowel obstruction)   [facility], [place] [facility] Pager [phone]. If 7PM-7AM, please contact night-coverage at [email or web address], password [facility] 01/13/2015, 5:51 PM  LOS: 4 days

## 2015-01-14 ENCOUNTER — Ambulatory Visit: Payer: Self-pay | Admitting: Nurse Practitioner

## 2015-01-14 ENCOUNTER — Ambulatory Visit: Payer: Self-pay

## 2015-01-14 ENCOUNTER — Other Ambulatory Visit: Payer: Self-pay | Admitting: Nurse Practitioner

## 2015-01-14 ENCOUNTER — Other Ambulatory Visit: Payer: Self-pay

## 2015-01-14 ENCOUNTER — Encounter (HOSPITAL_COMMUNITY): Payer: Self-pay | Admitting: Surgery

## 2015-01-14 ENCOUNTER — Telehealth: Payer: Self-pay | Admitting: *Deleted

## 2015-01-14 DIAGNOSIS — R319 Hematuria, unspecified: Secondary | ICD-10-CM

## 2015-01-14 LAB — URINALYSIS, ROUTINE W REFLEX MICROSCOPIC
Bilirubin Urine: NEGATIVE
Glucose, UA: NEGATIVE mg/dL
Ketones, ur: 40 mg/dL — AB
Leukocytes, UA: NEGATIVE
Nitrite: NEGATIVE
Protein, ur: NEGATIVE mg/dL
Specific Gravity, Urine: 1.006 (ref 1.005–1.030)
UROBILINOGEN UA: 1 mg/dL (ref 0.0–1.0)
pH: 6 (ref 5.0–8.0)

## 2015-01-14 LAB — HEPARIN LEVEL (UNFRACTIONATED): Heparin Unfractionated: 0.38 IU/mL (ref 0.30–0.70)

## 2015-01-14 LAB — PROTIME-INR
INR: 1.48 (ref 0.00–1.49)
Prothrombin Time: 18 seconds — ABNORMAL HIGH (ref 11.6–15.2)

## 2015-01-14 LAB — CBC
HCT: 25.6 % — ABNORMAL LOW (ref 36.0–46.0)
Hemoglobin: 8 g/dL — ABNORMAL LOW (ref 12.0–15.0)
MCH: 29.1 pg (ref 26.0–34.0)
MCHC: 31.3 g/dL (ref 30.0–36.0)
MCV: 93.1 fL (ref 78.0–100.0)
PLATELETS: 216 10*3/uL (ref 150–400)
RBC: 2.75 MIL/uL — ABNORMAL LOW (ref 3.87–5.11)
RDW: 17.8 % — ABNORMAL HIGH (ref 11.5–15.5)
WBC: 5.9 10*3/uL (ref 4.0–10.5)

## 2015-01-14 LAB — URINE MICROSCOPIC-ADD ON

## 2015-01-14 MED ORDER — HYDROMORPHONE HCL 2 MG PO TABS
4.0000 mg | ORAL_TABLET | ORAL | Status: DC | PRN
  Administered 2015-01-14 – 2015-01-15 (×3): 4 mg via ORAL
  Filled 2015-01-14 (×3): qty 2

## 2015-01-14 MED ORDER — ENSURE ENLIVE PO LIQD
237.0000 mL | Freq: Two times a day (BID) | ORAL | Status: DC
  Administered 2015-01-14 – 2015-01-15 (×3): 237 mL via ORAL

## 2015-01-14 NOTE — Progress Notes (Addendum)
[facility] PROGRESS NOTE  Memory Heinrichs XBM:[id number] DOB: 1960/12/20 DOA: 01/08/2015 PCP: Minerva Ends, MD   Off Service Summary: [id number] with hx of gastric cancer who presents with SBO. She has slowly improved and has since advanced to a soft diet. Patient was continued on a heparin bridge for a hx of PE. As of 5/31, the patient was noted to have gross hematuria and heparin stopped with a foley cath placed. Hemoglobin has remained stable thus far.  Assessment/Plan: 1. SBO 1. General Surgery following. Recs to continue to continue to advance diet as tolerated. 2. Patient reports continued flatus and bowel movements and continues to tolerate soft diet 3. Per surgery recs, will cautiously advance to a soft diet and monitor closely. If pt does not tolerate, may need NG 2. Gastric Cancer 1. Followed by Oncology - Dr. Benay Spice 2. Pain med adjustment noted for abd pain related to malignancy 3. Hypokalemia 1. Replaced 2. Continue to follow lytes 4. Moderate protein calorie malnutrition 1. Nutrition is following 2. Advancing diet per above 5. Hx PE 1. Coumadin currently on hold 2. Pt had been on heparin bridge 3. Pt with gross hematuria this AM (see below) and heparin will be stopped 4. Oncology recs to resume Coumadin when OK with Surgery 6. Hematuria 1. Hgb is slightly less than yesterday, but still in the 8 range 2. Given acute gross hematuria, will stop heparin gtt and place foley cath 3. Will follow cbc and monitor closely 4. Anticipate hematuria would eventually resolve through conservative means, however, if hematuria persists/worsens, would then consult Urology for assistance at that time 7. DVT prophylaxis 1. Stopped heparin gtt 2. Cont on SCD's  Code Status: Full Family Communication: Pt in room Disposition Plan: Pending   Consultants:  General Surgery  Oncology - Dr. Benay Spice  Procedures:    Antibiotics:  None  (indicate start date, and stop date  if known)  HPI/Subjective: Through translator app, pt reporting red colored urine. States she still has mild abd pain. Reports continuing to pass flatus and stools. States she is tolerating a full diet  Objective: Filed Vitals:   01/13/15 0536 01/13/15 1512 01/13/15 2227 01/14/15 0523  BP: 165/87 157/95 160/94 148/91  Pulse: 89 90 91 86  Temp: 98.7 F (37.1 C) 99 F (37.2 C) 99.7 F (37.6 C) 98.7 F (37.1 C)  TempSrc: Oral  Oral Oral  Resp: 16 16 16 18   Height:      Weight:      SpO2: 97% 97% 97% 98%    Intake/Output Summary (Last 24 hours) at 01/14/15 1224 Last data filed at 01/14/15 1000  Gross per 24 hour  Intake 2701.25 ml  Output    675 ml  Net 2026.25 ml   Filed Weights   01/09/15 0213  Weight: 75.569 kg (166 lb 9.6 oz)    Exam:   General:  Awake, laying in bed, talking on phone, in nad  Cardiovascular: regular, s1, s2  Respiratory: normal resp effort, no wheezing  Abdomen: soft, improved distention, positive BS, mildly tender in epigastric region  Musculoskeletal: perfused, no cyanosis  Data Reviewed: Basic Metabolic Panel:  Recent Labs Lab 01/08/15 1902 01/09/15 0437 01/10/15 0630  NA 137 138 139  K 3.1* 3.4* 3.7  CL 100* 101 105  CO2 28 30 26   GLUCOSE 112* 109* 79  BUN 9 10 8   CREATININE 0.55 0.57 0.49  CALCIUM 8.6* 8.1* 8.1*  MG  --  2.3  --   PHOS  --  5.1*  --    Liver Function Tests:  Recent Labs Lab 01/08/15 1902 01/09/15 0437  AST 15 25  ALT 9* 11*  ALKPHOS 90 86  BILITOT 0.9 0.7  PROT 6.3* 5.5*  ALBUMIN 3.6 3.1*    Recent Labs Lab 01/08/15 1902  LIPASE 11*   No results for input(s): AMMONIA in the last 168 hours. CBC:  Recent Labs Lab 01/08/15 1902  01/10/15 0630 01/11/15 0250 01/12/15 0600 01/13/15 0545 01/14/15 0600  WBC 7.0  < > 4.2 5.2 7.1 6.7 5.9  NEUTROABS 4.8  --  2.8  --   --   --   --   HGB 9.9*  < > 8.5* 8.2* 8.3* 8.1* 8.0*  HCT 31.8*  < > 28.3* 27.1* 27.3* 26.6* 25.6*  MCV 94.4  < > 96.9  96.4 95.5 95.3 93.1  PLT 287  < > 234 228 237 242 216  < > = values in this interval not displayed. Cardiac Enzymes: No results for input(s): CKTOTAL, CKMB, CKMBINDEX, TROPONINI in the last 168 hours. BNP (last 3 results) No results for input(s): BNP in the last 8760 hours.  ProBNP (last 3 results) No results for input(s): PROBNP in the last 8760 hours.  CBG: No results for input(s): GLUCAP in the last 168 hours.  No results found for this or any previous visit (from the past 240 hour(s)).   Studies: No results found.  Scheduled Meds: . feeding supplement (ENSURE ENLIVE)  237 mL Oral BID BM  . sodium chloride  10-40 mL Intracatheter Q12H   Continuous Infusions: . sodium chloride 75 mL/hr at 01/13/15 1344    Active Problems:   PE (pulmonary embolism)   Anticoagulated on Coumadin   Gastric cancer   Nausea with vomiting   SBO (small bowel obstruction)   Matalyn Nawaz K  [facility] Pager [phone]. If 7PM-7AM, please contact night-coverage at [email or web address], password [facility] 01/14/2015, 12:24 PM  LOS: 5 days

## 2015-01-14 NOTE — Progress Notes (Signed)
ANTICOAGULATION CONSULT NOTE - Follow Up Consult  Pharmacy Consult for Heparin Indication: hx of PE, warfarin bridging  Allergies  Allergen Reactions  . Xarelto [Rivaroxaban] Nausea And Vomiting, Palpitations and Other (See Comments)    Headaches and fatigue, numbness in hands/feet and vomitting    Patient Measurements: Height: 5' (152.4 cm) Weight: 166 lb 9.6 oz (75.569 kg) IBW/kg (Calculated) : 45.5 Heparin Dosing Weight: 62 kg  Vital Signs: Temp: 98.7 F (37.1 C) (05/31 0523) Temp Source: Oral (05/31 0523) BP: 148/91 mmHg (05/31 0523) Pulse Rate: 86 (05/31 0523)  Labs:  Recent Labs  01/12/15 0600 01/13/15 0545 01/13/15 0551 01/14/15 0600  HGB 8.3* 8.1*  --  8.0*  HCT 27.3* 26.6*  --  25.6*  PLT 237 242  --  216  LABPROT 21.6*  --  21.4* 18.0*  INR 1.89*  --  1.86* 1.48  HEPARINUNFRC 0.41 0.41  --  0.38    Estimated Creatinine Clearance: 72.1 mL/min (by C-G formula based on Cr of 0.49).   Assessment: 40 yoF on chronic warfarin therapy PTA for history of PE admitted for SBO.  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  Plan is for medical management for now per surgery.  Holding warfarin to allow INR to normalize in case surgery becomes necessary and will start heparin for bridge once INR<2.  Last warfarin dose 5/24.  Hgb low but stable.   Today, 01/14/2015  Heparin level therapeutic on 950 units/hr  Hgb low but stable, Plts WNL  No bleeding documented  Pt on CLD and having BMs  Goal of Therapy:  Heparin level 0.3-0.7 units/ml Monitor platelets by anticoagulation protocol: Yes   Plan:  1) Continue IV heparin rate at 950 units/hr 2) Daily INR, heparin level and CBC 3) Follow up plans to resume oral  anticoagulation  Ralene Bathe, PharmD, BCPS 01/14/2015, 7:14 AM  Pager: [phone]

## 2015-01-14 NOTE — Telephone Encounter (Signed)
Per staff message and POF I have scheduled appts. Advised scheduler of appts. JMW  

## 2015-01-14 NOTE — Progress Notes (Signed)
[facility] Progress Note     Subjective: Pt states still has epigastric abdominal pain, not improved, but no N/V, no fever/chills.  Main complaint is blood in urine.  She says she had blood both times she urinated this am.  Interpretor at bedside helping [place] translate.  She's able to ambulate pretty well.  Having flatus and BM's.    Objective: Vital signs in last 24 hours: Temp:  [98.7 F (37.1 C)-99.7 F (37.6 C)] 98.7 F (37.1 C) (05/31 0523) Pulse Rate:  [86-91] 86 (05/31 0523) Resp:  [16-18] 18 (05/31 0523) BP: (148-160)/(91-95) 148/91 mmHg (05/31 0523) SpO2:  [97 %-98 %] 98 % (05/31 0523) Last BM Date: 01/13/15  Intake/Output from previous day: 05/30 0701 - 05/31 0700 In: 3753 [P.O.:960; I.V.:2793] Out: 400 [Urine:400] Intake/Output this shift: Total I/O In: -  Out: 275 [Urine:275]  PE: Gen:  Alert, NAD, pleasant Card:  RRR, no M/G/R heard, IS to 1000 Pulm:  CTA, no W/R/R Abd: Soft, ND, tender in epigastrium, +BS, no HSM   Lab Results:   Recent Labs  01/13/15 0545 01/14/15 0600  WBC 6.7 5.9  HGB 8.1* 8.0*  HCT 26.6* 25.6*  PLT 242 216   BMET No results for input(s): NA, K, CL, CO2, GLUCOSE, BUN, CREATININE, CALCIUM in the last 72 hours. PT/INR  Recent Labs  01/13/15 0551 01/14/15 0600  LABPROT 21.4* 18.0*  INR 1.86* 1.48   CMP     Component Value Date/Time   NA 139 01/10/2015 0630   NA 140 12/31/2014 1030   K 3.7 01/10/2015 0630   K 3.7 12/31/2014 1030   CL 105 01/10/2015 0630   CO2 26 01/10/2015 0630   CO2 25 12/31/2014 1030   GLUCOSE 79 01/10/2015 0630   GLUCOSE 95 12/31/2014 1030   BUN 8 01/10/2015 0630   BUN 8.5 12/31/2014 1030   CREATININE 0.49 01/10/2015 0630   CREATININE 0.5* 12/31/2014 1030   CREATININE 0.61 05/17/2014 1515   CALCIUM 8.1* 01/10/2015 0630   CALCIUM 8.7 12/31/2014 1030   PROT 5.5* 01/09/2015 0437   PROT 6.2* 12/31/2014 1030   ALBUMIN 3.1* 01/09/2015 0437   ALBUMIN 3.5 12/31/2014 1030   AST 25  01/09/2015 0437   AST 13 12/31/2014 1030   ALT 11* 01/09/2015 0437   ALT 8 12/31/2014 1030   ALKPHOS 86 01/09/2015 0437   ALKPHOS 94 12/31/2014 1030   BILITOT 0.7 01/09/2015 0437   BILITOT 0.65 12/31/2014 1030   GFRNONAA >60 01/10/2015 0630   GFRNONAA >89 05/17/2014 1515   GFRAA >60 01/10/2015 0630   GFRAA >89 05/17/2014 1515   Lipase     Component Value Date/Time   LIPASE 11* 01/08/2015 1902       Studies/Results: No results found.  Anti-infectives: Anti-infectives    None       Assessment/Plan SBO -DG abdomen on 5/28 shows no obstruction, but stool throughout the colon. Having BM's.  Consider suppository/enemas.  For now, the most appropriate treatment is medical management. -There is no evidence of compromised bowel or indication of urgent surgical complication. -Pt tolerating diet advancement, now on full liquids. -Laparotomy will be reserved for failure to resolve her SBO, and only as a last resort. -Holding Coumadin. On heparin in the interim. -Ambulate and IS -Pain control could be switched to orals -Advance diet as tolerated.  IF she fails diet advancement, may need NG tube decompression. -May benefit from protonix BID and possibly carafate  Metastatic gastric cancer Stage IV & Carcinomatosis. 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

## 2015-01-14 NOTE — Progress Notes (Signed)
IP PROGRESS NOTE  Subjective:   She is tolerating a diet. She denies nausea. She continues to have mid abdominal pain. The pain is relieved with IV Dilaudid. Oxycodone does not provide adequate pain relief.    Objective: Vital signs in last 24 hours: Blood pressure 148/91, pulse 86, temperature 98.7 F (37.1 C), temperature source Oral, resp. rate 18, height 5' (1.524 m), weight 166 lb 9.6 oz (75.569 kg), SpO2 98 %.  Intake/Output from previous day: 05/30 0701 - 05/31 0700 In: 3753 [P.O.:960; I.V.:2793] Out: 400 [Urine:400]  Physical Exam:   Abdomen: Soft, tender in the mid abdomen, periumbilical masslike fullness. Extremities: No leg edema   Portacath/PICC-without erythema  Lab Results:  Recent Labs  01/13/15 0545 01/14/15 0600  WBC 6.7 5.9  HGB 8.1* 8.0*  HCT 26.6* 25.6*  PLT 242 216     Medications: I have reviewed the patient's current medications.  Assessment/Plan: 1. Stage IV gastric cancer  Status post bilateral oophorectomy, total abdominal hysterectomy, omentectomy, resection of abdominal wall and pelvic masses, bilateral pelvic lymphadenectomy 10/18/2013 with the pathology confirming moderate to poorly differentiated adenocarcinoma  Stomach biopsy 11/28/2013 revealed adenocarcinoma  Status post 12 cycles of FOLFOX, last given 04/25/2014  Restaging CT 04/29/2014 with thickening of the stomach and nodular densities adjacent to the greater curvature-unchanged, faint groundglass nodularity in the lungs-nonspecific  Restaging CT 07/22/2014 with indeterminate lung nodules; moderate wall thickening in the body region of the stomach; fairly extensive omental disease and findings suspicious for abdominal wall and incisional disease.  Started systemic chemotherapy with FOLFIRI on 07/30/14.  Restaging CT evaluation 10/14/2014 following 5 cycles of FOLFIRI showed decreased nodularity in both lungs; overall stable omental nodularity; involution of the previously  demonstrated ill-defined soft tissue mass involving the anterior abdominal wall incision.  Cycle 6 FOLFIRI 10/15/2014  Cycle 7 FOLFIRI 10/29/2014  Cycle 8 FOLFIRI 11/12/2014  Cycle 9 FOLFIRI 11/26/2014 (dose reduction of 5-fluorouracil due to mucositis)  CT abdomen/pelvis 12/05/2014 revealed mild progression of peritoneal/omental tumor  Initiation of weekly Taxol (3 weeks on/1 week off) and every 2 week Ramucirumab 12/17/2014   2. Oxaliplatin neuropathy   3. Bilateral upper and lower lobe pulmonary emboli within segmental branches-now maintained on Coumadin. Followed by Coumadin clinic at the [facility]   4. Neutropenia related to chemotherapy 08/13/2014. Cycle 2 FOLFIRI held. Neulasta added beginning with cycle 2 on 08/20/2014.   5. Nausea following chemotherapy-the anti-emetic regimen was adjusted with cycle 4 FOLFIRI 09/17/2014   6. Diarrhea following FOLFIRI-no improvement with Imodium. Lomotil initiated 10/15/2014.    7. Mouth sores following cycle 8 FOLFIRI. 5 fluorouracil dose reduced.   8. Admission 12/05/2014 with nausea/vomiting and diarrhea-likely secondary to FOLFIRI chemotherapy   9. Abdominal pain secondary to carcinomatosis and gastric tumor   10. 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  Admission 01/09/2015 with abdominal pain and nausea/vomiting-a CT confirmed a distal small bowel obstruction.   The obstructive symptoms appear improved. She continues to have mid abdominal pain. I will add oral Dilaudid.  Recommendations: 1. Diet per recommendations of surgery 2. Narcotic analgesics for pain, I will add Dilaudid. 3. Resume Coumadin anticoagulation 4. Office visit and chemotherapy will be rescheduled at the Cancer center for 01/15/2015   LOS: 5 days   Aryahi Denzler  01/14/2015, 9:12 AM

## 2015-01-15 ENCOUNTER — Ambulatory Visit: Payer: Self-pay

## 2015-01-15 ENCOUNTER — Inpatient Hospital Stay (HOSPITAL_COMMUNITY): Payer: Medicaid Other

## 2015-01-15 ENCOUNTER — Ambulatory Visit: Payer: Self-pay | Admitting: Nurse Practitioner

## 2015-01-15 ENCOUNTER — Other Ambulatory Visit: Payer: Self-pay | Admitting: *Deleted

## 2015-01-15 ENCOUNTER — Other Ambulatory Visit: Payer: Self-pay

## 2015-01-15 DIAGNOSIS — K5669 Other intestinal obstruction: Secondary | ICD-10-CM

## 2015-01-15 DIAGNOSIS — R1110 Vomiting, unspecified: Secondary | ICD-10-CM

## 2015-01-15 LAB — DIFFERENTIAL
Basophils Absolute: 0 10*3/uL (ref 0.0–0.1)
Basophils Relative: 0 % (ref 0–1)
EOS ABS: 0.1 10*3/uL (ref 0.0–0.7)
EOS PCT: 2 % (ref 0–5)
LYMPHS ABS: 1 10*3/uL (ref 0.7–4.0)
LYMPHS PCT: 17 % (ref 12–46)
Monocytes Absolute: 0.9 10*3/uL (ref 0.1–1.0)
Monocytes Relative: 16 % — ABNORMAL HIGH (ref 3–12)
NEUTROS ABS: 3.6 10*3/uL (ref 1.7–7.7)
Neutrophils Relative %: 65 % (ref 43–77)

## 2015-01-15 LAB — PROTIME-INR
INR: 1.25 (ref 0.00–1.49)
Prothrombin Time: 15.8 seconds — ABNORMAL HIGH (ref 11.6–15.2)

## 2015-01-15 LAB — CBC
HEMATOCRIT: 26.1 % — AB (ref 36.0–46.0)
Hemoglobin: 7.9 g/dL — ABNORMAL LOW (ref 12.0–15.0)
MCH: 28.7 pg (ref 26.0–34.0)
MCHC: 30.3 g/dL (ref 30.0–36.0)
MCV: 94.9 fL (ref 78.0–100.0)
Platelets: 209 10*3/uL (ref 150–400)
RBC: 2.75 MIL/uL — ABNORMAL LOW (ref 3.87–5.11)
RDW: 18.2 % — AB (ref 11.5–15.5)
WBC: 5 10*3/uL (ref 4.0–10.5)

## 2015-01-15 LAB — HEPARIN LEVEL (UNFRACTIONATED)

## 2015-01-15 MED ORDER — CIPROFLOXACIN HCL 500 MG PO TABS
500.0000 mg | ORAL_TABLET | Freq: Two times a day (BID) | ORAL | Status: DC
  Administered 2015-01-15: 500 mg via ORAL
  Filled 2015-01-15 (×3): qty 1

## 2015-01-15 MED ORDER — PANTOPRAZOLE SODIUM 40 MG PO TBEC: 40.0000 mg | DELAYED_RELEASE_TABLET | Freq: Two times a day (BID) | ORAL | Status: DC

## 2015-01-15 MED ORDER — WARFARIN SODIUM 4 MG PO TABS: 4.0000 mg | ORAL_TABLET | ORAL | Status: DC

## 2015-01-15 MED ORDER — WARFARIN - PHARMACIST DOSING INPATIENT: Freq: Every day | Status: DC

## 2015-01-15 MED ORDER — HEPARIN SOD (PORK) LOCK FLUSH 100 UNIT/ML IV SOLN
500.0000 [IU] | INTRAVENOUS | Status: AC | PRN
  Administered 2015-01-15: 500 [IU]

## 2015-01-15 MED ORDER — ENSURE ENLIVE PO LIQD: 237.0000 mL | Freq: Two times a day (BID) | ORAL | Status: DC

## 2015-01-15 MED ORDER — CIPROFLOXACIN HCL 500 MG PO TABS: 500.0000 mg | ORAL_TABLET | Freq: Two times a day (BID) | ORAL | Status: DC

## 2015-01-15 MED ORDER — PANTOPRAZOLE SODIUM 40 MG PO TBEC
40.0000 mg | DELAYED_RELEASE_TABLET | Freq: Two times a day (BID) | ORAL | Status: DC
  Administered 2015-01-15: 40 mg via ORAL
  Filled 2015-01-15: qty 1

## 2015-01-15 MED ORDER — HYDROMORPHONE HCL 4 MG PO TABS: 4.0000 mg | ORAL_TABLET | ORAL | Status: DC | PRN

## 2015-01-15 MED ORDER — WARFARIN SODIUM 6 MG PO TABS
6.0000 mg | ORAL_TABLET | Freq: Once | ORAL | Status: DC
  Filled 2015-01-15: qty 1

## 2015-01-15 NOTE — Progress Notes (Signed)
[facility] Progress Note     Subjective: Pain significantly improved.  No N/V except a small amount of vomiting yesterday before dinner.  She denies food making her nauseated.  Ambulating some.  Says her urine was bloody yesterday, but I showed her the catheter today and did not see any gross blood.    Objective: Vital signs in last 24 hours: Temp:  [98.1 F (36.7 C)-98.7 F (37.1 C)] 98.6 F (37 C) (06/01 0624) Pulse Rate:  [87-90] 87 (06/01 0624) Resp:  [18] 18 (06/01 0624) BP: (122-176)/(84-94) 130/84 mmHg (06/01 0624) SpO2:  [97 %-98 %] 98 % (06/01 0624) Last BM Date: 01/14/15  Intake/Output from previous day: 05/31 0701 - 06/01 0700 In: 1395 [P.O.:480; I.V.:915] Out: 1500 [Urine:1500] Intake/Output this shift:    PE: Gen:  Alert, NAD, pleasant Abd: Soft, NT/ND, +BS, no HSM, urine is medium yellow without obvious signs of blood   Lab Results:   Recent Labs  01/14/15 0600 01/15/15 0400  WBC 5.9 5.0  HGB 8.0* 7.9*  HCT 25.6* 26.1*  PLT 216 209   BMET No results for input(s): NA, K, CL, CO2, GLUCOSE, BUN, CREATININE, CALCIUM in the last 72 hours. PT/INR  Recent Labs  01/14/15 0600 01/15/15 0400  LABPROT 18.0* 15.8*  INR 1.48 1.25   CMP     Component Value Date/Time   NA 139 01/10/2015 0630   NA 140 12/31/2014 1030   K 3.7 01/10/2015 0630   K 3.7 12/31/2014 1030   CL 105 01/10/2015 0630   CO2 26 01/10/2015 0630   CO2 25 12/31/2014 1030   GLUCOSE 79 01/10/2015 0630   GLUCOSE 95 12/31/2014 1030   BUN 8 01/10/2015 0630   BUN 8.5 12/31/2014 1030   CREATININE 0.49 01/10/2015 0630   CREATININE 0.5* 12/31/2014 1030   CREATININE 0.61 05/17/2014 1515   CALCIUM 8.1* 01/10/2015 0630   CALCIUM 8.7 12/31/2014 1030   PROT 5.5* 01/09/2015 0437   PROT 6.2* 12/31/2014 1030   ALBUMIN 3.1* 01/09/2015 0437   ALBUMIN 3.5 12/31/2014 1030   AST 25 01/09/2015 0437   AST 13 12/31/2014 1030   ALT 11* 01/09/2015 0437   ALT 8 12/31/2014 1030   ALKPHOS  86 01/09/2015 0437   ALKPHOS 94 12/31/2014 1030   BILITOT 0.7 01/09/2015 0437   BILITOT 0.65 12/31/2014 1030   GFRNONAA >60 01/10/2015 0630   GFRNONAA >89 05/17/2014 1515   GFRAA >60 01/10/2015 0630   GFRAA >89 05/17/2014 1515   Lipase     Component Value Date/Time   LIPASE 11* 01/08/2015 1902       Studies/Results: No results found.  Anti-infectives: Anti-infectives    None       Assessment/Plan SBO -DG abdomen on 5/28 shows no obstruction, but stool throughout the colon. Having BM's. Consider suppository/enemas. For now, the most appropriate treatment is medical management. -There is no evidence of compromised bowel or indication of urgent surgical complication. -Pt tolerating full liquid diet, will likely have to d/c home with full liquids and supplements, may need dietitian -Does not appear to need laparotomy at this point given improvement -Has hematuria so blood thinners on hold for that.   -Ambulate and IS -On orals and IV for pain -Added protonix BID due to likely gastritis, consider possibly carafate  Metastatic gastric cancer Stage IV & Carcinomatosis. 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

## 2015-01-15 NOTE — Progress Notes (Signed)
[facility] for Warfarin Indication: hx of PE  Allergies  Allergen Reactions  . Xarelto [Rivaroxaban] Nausea And Vomiting, Palpitations and Other (See Comments)    Headaches and fatigue, numbness in hands/feet and vomitting    Patient Measurements: Height: 5' (152.4 cm) Weight: 166 lb 9.6 oz (75.569 kg) IBW/kg (Calculated) : 45.5 Heparin Dosing Weight: 62 kg  Vital Signs: Temp: 98.6 F (37 C) (06/01 0624) Temp Source: Oral (06/01 0624) BP: 130/84 mmHg (06/01 0624) Pulse Rate: 87 (06/01 0624)  Labs:  Recent Labs  01/13/15 0545 01/13/15 0551 01/14/15 0600 01/15/15 0400  HGB 8.1*  --  8.0* 7.9*  HCT 26.6*  --  25.6* 26.1*  PLT 242  --  216 209  LABPROT  --  21.4* 18.0* 15.8*  INR  --  1.86* 1.48 1.25  HEPARINUNFRC 0.41  --  0.38 <0.10*    Estimated Creatinine Clearance: 72.1 mL/min (by C-G formula based on Cr of 0.49).   Assessment: 46 yoF on chronic warfarin therapy PTA for history of PE admitted for SBO.  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  Plan is for medical management per surgery.  Held warfarin to allow INR to normalize in case surgery became necessary and started heparin for bridge.  Heparin discontinued 5/31 due to hematuria.  Pt did not require surgery and plan was for discharge today with resumption of warfarin.  Pt began vomiting again this afternoon.  Abdominal xray ordered.  PTA warfarin dose reported as 4 mg daily except takes 6 mg on Tuesday/Thursday/Saturdays.  INR 1.25 Hgb 7.9 (low but appears stable), Platelets wnl Drug interaction: Cipro x 3 days planned.    Goal of Therapy:  INR 2-3   Plan:  1.  Warfarin 6 mg today if remains inpatient this evening.  Per RN,  waiting on results of abdominal xray. 2.  Daily INR.  Hershal Coria, PharmD, BCPS Pager: [phone] 01/15/2015 2:04 PM

## 2015-01-15 NOTE — Discharge Summary (Signed)
Physician Discharge Summary  Alicia [name] EHU:[id number] DOB: 1960-08-24 DOA: 01/08/2015  PCP: Minerva Ends, MD  Admit date: 01/08/2015 Discharge date: 01/15/2015  Recommendations for Outpatient Follow-up:  1. F/u with Dr. Benay Spice on 6/2 for chemotherapy 2. F/u with Urology in 1-2 weeks for follow up of hematuria 3. Okay to resume coumadin  4. 3-day course of ciprofloxacin for possible UTI 5. Please follow up on pending urine culture 6. Given prescription for oral dilaudid 4mg , #120 tabs  Discharge Diagnoses:  Active Problems:   PE (pulmonary embolism)   Anticoagulated on Coumadin   Gastric cancer   Nausea with vomiting   SBO (small bowel obstruction)   Discharge Condition: stable, improved  Diet recommendation: full liquid and purees only  Wt Readings from Last 3 Encounters:  01/09/15 75.569 kg (166 lb 9.6 oz)  12/31/14 73.846 kg (162 lb 12.8 oz)  12/17/14 75.206 kg (165 lb 12.8 oz)    History of present illness:  The patient is a 54 year old female with history of gastric cancer which has metastasized to the bones, pulmonary embolism on Coumadin, who presented with 24 hours of nausea, vomiting, and a small amount of rectal bleeding. She underwent a CT scan of the abdomen and pelvis which demonstrated a small bowel obstruction with a transition point worrisome for adhesions. She was also noted to have right ureteral dilation and some intra-abdominal ascites. She was admitted to the hospitalist service and has been followed by general surgery and oncology.  Hospital Course:  Small bowel obstruction secondary to metastatic gastric cancer and adhesions. She was seen by general surgery who followed along however she had clinical improvement with bowel rest. She has been advanced to a full liquid diet which she should continue probably indefinitely and she has considerable improvement in her metastatic cancer. She was passing flatness and having bowel movements prior to  discharge. She did not require an NG tube.  Gastric cancer followed by Dr. Benay Spice. She is undergoing treatment with salvage therapy and will be scheduled for her next treatment on 6/2. She was not having adequate pain control with oxycodone and has been given a prescription for Dilaudid. I have recommended that she use MiraLAX to continue having regular bowel movements since Dilaudid can be constipating.  Hypokalemia secondary to poor oral intake, resolved with IV supplementation  Moderate protein calorie malnutrition. She was seen by nutrition who assisted with dietary recommendations.  History of pulmonary embolism diagnosed 04/2014. Her Coumadin was held initially because of the possibility of needing abdominal surgery to relieve her bowel obstruction, however, she did not require surgery. She was placed on a heparin drip. Her course was complicated by some hematuria which resolved after heparin was discontinued. She should resume her Coumadin at discharge.  Hematuria, I am concerned that this may reflect some metastatic gastric cancer somewhere along the GU tract. She also had evidence of hydronephrosis on her CT scan. The case was discussed with urology who recommended that she follow-up in clinic in approximately 1-2 weeks. He recommended that we check a urine culture which I have added and treat empirically for urinary tract infection. I have started her on a 3 day course of ciprofloxacin. She is advised that she may continue to have some ongoing hematuria and that she can receive blood transfusions if needed through the oncology clinic.  Procedures:  CT scan of the abdomen and pelvis  Consultations:  General surgery  Oncology  Urology, by phone  Discharge Exam: Filed Vitals:   01/15/15  0624  BP: 130/84  Pulse: 87  Temp: 98.6 F (37 C)  Resp: 18   Filed Vitals:   01/14/15 0523 01/14/15 1508 01/14/15 2135 01/15/15 0624  BP: 148/91 122/94 176/88 130/84  Pulse: 86 89 90 87   Temp: 98.7 F (37.1 C) 98.7 F (37.1 C) 98.1 F (36.7 C) 98.6 F (37 C)  TempSrc: Oral  Oral Oral  Resp: 18 18 18 18   Height:      Weight:      SpO2: 98% 98% 97% 98%   Communicated via [place] interpreter  General: Thin female, no acute distress. Cardiovascular: Tachycardic, regular rhythm, no murmurs, rubs, or gallops Respiratory: Clear to auscultation bilaterally, no increased work of breathing Abdomen: NABS, soft, tender to palpation particularly in the peri-umbilical area, epigastric area, right upper quadrant. She has a palpable mass near her umbilicus. MSK: Normal tone and bulk, no lower extremity edema  Discharge Instructions      Discharge Instructions    Call MD for:  difficulty breathing, headache or visual disturbances    Complete by:  As directed      Call MD for:  extreme fatigue    Complete by:  As directed      Call MD for:  hives    Complete by:  As directed      Call MD for:  persistant dizziness or light-headedness    Complete by:  As directed      Call MD for:  persistant nausea and vomiting    Complete by:  As directed      Call MD for:  severe uncontrolled pain    Complete by:  As directed      Call MD for:  temperature >100.4    Complete by:  As directed      Discharge instructions    Complete by:  As directed   You were hospitalized with a blockage in your bowels from your cancer.  Please eat pureed or liquid foods for now.  You will receive chemotherapy tomorrow at Dr. Gearldine Shown office to help with your cancer and hopefully your blockage too.  You had some blood in your urine.  The urologist would like you to call their office to schedule an appointment for about two weeks from now.  If your bleeding is from cancer, he said to expect to have ongoing bleeding even at home.  Dr. Benay Spice can give you blood transfusions if needed in the meantime.  You should take an antibiotic ciprofloxacin for three days for possible urinary tract infection.  If  you cannot pee and feel your bladder is getting distended, please return to the hospital right away.  If you develop fevers, worsening abdominal pain, vomiting, please return to the hospital right away.     Increase activity slowly    Complete by:  As directed             Medication List    STOP taking these medications        oxyCODONE 5 MG immediate release tablet  Commonly known as:  ROXICODONE     potassium chloride SA 20 MEQ tablet  Commonly known as:  K-DUR,KLOR-CON      TAKE these medications        ciprofloxacin 500 MG tablet  Commonly known as:  CIPRO  Take 1 tablet (500 mg total) by mouth 2 (two) times daily.     diphenoxylate-atropine 2.5-0.025 MG per tablet  Commonly known as:  LOMOTIL  Take  2 tablets by mouth 4 (four) times daily as needed for diarrhea or loose stools.     feeding supplement (ENSURE ENLIVE) Liqd  Take 237 mLs by mouth 2 (two) times daily between meals.     HYDROmorphone 4 MG tablet  Commonly known as:  DILAUDID  Take 1-2 tablets (4-8 mg total) by mouth every 4 (four) hours as needed for severe pain.     magic mouthwash Soln  Take 5 mLs by mouth 4 (four) times daily as needed for mouth pain.     ondansetron 4 MG tablet  Commonly known as:  ZOFRAN  Take 1 tablet (4 mg total) by mouth every 6 (six) hours.     pantoprazole 40 MG tablet  Commonly known as:  PROTONIX  Take 1 tablet (40 mg total) by mouth 2 (two) times daily.     PRESCRIPTION MEDICATION  Chemo [facility]     prochlorperazine 10 MG tablet  Commonly known as:  COMPAZINE  Take 1 tablet (10 mg total) by mouth every 6 (six) hours as needed for nausea or vomiting.     sertraline 100 MG tablet  Commonly known as:  ZOLOFT  Take 1 tablet (100 mg total) by mouth daily.     warfarin 4 MG tablet  Commonly known as:  COUMADIN  Take 1-1.5 tablets (4-6 mg total) by mouth See admin instructions. 4 mg Sunday Monday Wednsday Friday 6 mg Tuesday Thursday Saturday       Follow-up  Information    Schedule an appointment as soon as possible for a visit with Minerva Ends, MD.   Specialty:  Family Medicine   Why:  As needed   Contact information:   [place] [id number] [phone]       Follow up with Betsy Coder, MD On 01/16/2015.   Specialty:  Oncology   Contact information:   [place] [phone]       Follow up with [facility]. Schedule an appointment as soon as possible for a visit in 2 weeks.   Contact information:   [place] [phone]       The results of significant diagnostics from this hospitalization (including imaging, microbiology, ancillary and laboratory) are listed below for reference.    Significant Diagnostic Studies: Ct Abdomen Pelvis W Contrast  01/08/2015   CLINICAL DATA:  Abdominal pain and diarrhea. History of adenocarcinoma the small intestine.  EXAM: CT ABDOMEN AND PELVIS WITH CONTRAST  TECHNIQUE: Multidetector CT imaging of the abdomen and pelvis was performed using the standard protocol following bolus administration of intravenous contrast.  CONTRAST:  18mL OMNIPAQUE IOHEXOL 300 MG/ML  SOLN  COMPARISON:  CT abdomen pelvis - 12/05/2014; 10/14/2014 ; CT the chest, abdomen pelvis - 07/22/2014  FINDINGS: Postsurgical change of the mid small bowel located within the midline of the lower pelvis. There is moderate upstream gas and fluid distention of the small bowel with apparent transition point within the mid abdomen adjacent to the operative site (axial image 58, series 2, coronal image 29, series 3) with subsequent decompression of the distal small bowel and colon, findings compatible with small bowel obstruction.  Interval development of a small amount of intra-abdominal ascites with minimal amount of free fluid within the root of the abdominal mesenteric (51, series 2) without definable/drainable fluid collection. No  pneumoperitoneum, pneumatosis or portal venous gas.  Scattered shotty retroperitoneal lymph nodes are numerous though  individually not enlarged by size criteria with index left-sided periaortic lymph node measuring 0.9 cm in greatest Bronte Kropf axis diameter (image 19, series 7). Omental caking about the cranial aspect of the greater omentum is grossly unchanged with index omental nodule measuring approximately 1 cm in greatest Annalysa Mohammad axis diameter (Image 9, series 7).  Normal hepatic contour. The gallbladder is under distended and thus suboptimally evaluated. Borderline enlarged caliber the common bile duct measuring 9 mm in diameter. There is mild periportal edema. The the portal vein appears patent .  There is symmetric enhancement and excretion of the bilateral kidneys. Interval development of mild right-sided pelvicaliectasis and ureterectasis, the etiology of which is not depicted and as the examination though appears to be centered at the location of the pelvic brim. No definite renal stones. No left-sided urinary obstruction. No renal lesions.  Normal appearance of the bilateral adrenal glands, pancreas and spleen.  Limited visualization of lower thorax demonstrates minimal linear subsegmental atelectasis within the imaged right lower lobe. No discrete focal airspace opacities. No pleural effusion.  Borderline cardiomegaly.  No pericardial effusion.  Sclerotic approximately 1.4 x 1.3 cm osseous metastasis within the right ilium is grossly unchanged (image 70, series 2).  Nodular thickening about the caudal aspect of the midline ventral abdominal wall surgical scars grossly unchanged (representative image 54, series 2).  IMPRESSION: 1. Findings worrisome for distal small bowel obstruction with transition point regional to the small bowel enteric anastomosis and thus presumably secondary to adhesions. No evidence of perforation or definable/drainable fluid collection. 2. Interval development of mild right-sided  pelviectasis and ureterectasis with the right ureter dilated to the level of the pelvic brim - the exact etiology of this obstruction is not depicted on this examination (specifically, no obstructing right-sided ureteral stone is identified) and in the setting of bilateral pelvic sidewall adenectomy, this apparent right-sided ureteral obstruction could be secondary to developing adhesions. 3. Interval development of a small amount of intra-abdominal ascites, presumably reactive in etiology though in the setting of grossly unchanged peritoneal carcinomatosis, malignant ascites is not excluded. 4. Unchanged approximately 1.4 cm sclerotic osseous metastasis within the right ilium.   Electronically Signed   By: Sandi Mariscal M.D.   On: 01/08/2015 22:50   Dg Abd 2 Views  01/10/2015   CLINICAL DATA:  Patient with 1 month of abdominal pain.  EXAM: ABDOMEN - 2 VIEW  COMPARISON:  01/09/2015  FINDINGS: Multiple gaseous distended loops of small bowel are demonstrated within the central abdomen measuring up to 4 cm. Differential air-fluid levels are demonstrated on the upright imaging. No free intraperitoneal air. Minimal atelectasis left lung base. Pelvic surgical clips. Regional skeleton unremarkable. Contrast material in the distal colon.  IMPRESSION: Differential air-fluid levels on upright imaging most compatible with small bowel obstruction. Contrast is demonstrated within the distal colon therefore this may represent a partial obstruction.   Electronically Signed   By: Lovey Newcomer M.D.   On: 01/10/2015 08:37   Dg Abd 2 Views  01/09/2015   CLINICAL DATA:  Gastric cancer, abdominal pain, nausea and vomiting.  EXAM: ABDOMEN - 2 VIEW  COMPARISON:  CT abdomen/pelvis 01/08/2015  FINDINGS: Multiple differential air-fluid levels are identified, predominantly colon, although a mildly dilated mid abdominal small bowel loop is identified measuring at least 3.3 cm. Retained contrast within moderately dilated right renal  collecting system and right ureter. Pelvic clips are noted. No free air.  IMPRESSION: Multiple air-fluid levels suggesting mid to distal small bowel obstruction as seen previously. Contrast  does reach the colon therefore an element of partial obstruction or ileus could appear similar.  Right hydroureteronephrosis reidentified.   Electronically Signed   By: Conchita Paris M.D.   On: 01/09/2015 09:24   Dg Abd Portable 1v  01/11/2015   CLINICAL DATA:  Followup small bowel obstruction.  Hx of adenocarcinoma small intestine (dx 10/18/2013), abdominal hysterectomy (10/18/2013).Right marker was accidentally collimated off bottom right corner. Left (L) marker was annotated on portable  EXAM: PORTABLE ABDOMEN - 1 VIEW  COMPARISON:  01/10/2015  FINDINGS: Mildly dilated central small bowel appreciated, similar to the previous day's study. Air is seen within a normal caliber colon and in the rectum.  There surgical clips the pelvis, stable. Soft tissues otherwise unremarkable.  IMPRESSION: Persistent partial small bowel obstruction. No significant change from the previous day's study.   Electronically Signed   By: Lajean Manes M.D.   On: 01/11/2015 09:23    Microbiology: No results found for this or any previous visit (from the past 240 hour(s)).   Labs: Basic Metabolic Panel:  Recent Labs Lab 01/08/15 1902 01/09/15 0437 01/10/15 0630  NA 137 138 139  K 3.1* 3.4* 3.7  CL 100* 101 105  CO2 28 30 26   GLUCOSE 112* 109* 79  BUN 9 10 8   CREATININE 0.55 0.57 0.49  CALCIUM 8.6* 8.1* 8.1*  MG  --  2.3  --   PHOS  --  5.1*  --    Liver Function Tests:  Recent Labs Lab 01/08/15 1902 01/09/15 0437  AST 15 25  ALT 9* 11*  ALKPHOS 90 86  BILITOT 0.9 0.7  PROT 6.3* 5.5*  ALBUMIN 3.6 3.1*    Recent Labs Lab 01/08/15 1902  LIPASE 11*   No results for input(s): AMMONIA in the last 168 hours. CBC:  Recent Labs Lab 01/08/15 1902  01/10/15 0630 01/11/15 0250 01/12/15 0600 01/13/15 0545  01/14/15 0600 01/15/15 0400  WBC 7.0  < > 4.2 5.2 7.1 6.7 5.9 5.0  NEUTROABS 4.8  --  2.8  --   --   --   --   --   HGB 9.9*  < > 8.5* 8.2* 8.3* 8.1* 8.0* 7.9*  HCT 31.8*  < > 28.3* 27.1* 27.3* 26.6* 25.6* 26.1*  MCV 94.4  < > 96.9 96.4 95.5 95.3 93.1 94.9  PLT 287  < > 234 228 237 242 216 209  < > = values in this interval not displayed. Cardiac Enzymes: No results for input(s): CKTOTAL, CKMB, CKMBINDEX, TROPONINI in the last 168 hours. BNP: BNP (last 3 results) No results for input(s): BNP in the last 8760 hours.  ProBNP (last 3 results) No results for input(s): PROBNP in the last 8760 hours.  CBG: No results for input(s): GLUCAP in the last 168 hours.  Time coordinating discharge: 35 minutes  Signed:  Auburn Hester  [facility] 01/15/2015, 11:48 AM

## 2015-01-15 NOTE — Progress Notes (Signed)
Pt urinated 150cc dark brownish urine in toilet.  Coumadin held for tonight.

## 2015-01-16 ENCOUNTER — Ambulatory Visit (HOSPITAL_BASED_OUTPATIENT_CLINIC_OR_DEPARTMENT_OTHER): Payer: Self-pay

## 2015-01-16 ENCOUNTER — Ambulatory Visit: Payer: Self-pay

## 2015-01-16 ENCOUNTER — Other Ambulatory Visit (HOSPITAL_BASED_OUTPATIENT_CLINIC_OR_DEPARTMENT_OTHER): Payer: Self-pay

## 2015-01-16 ENCOUNTER — Telehealth: Payer: Self-pay | Admitting: Oncology

## 2015-01-16 ENCOUNTER — Encounter: Payer: Self-pay | Admitting: *Deleted

## 2015-01-16 ENCOUNTER — Ambulatory Visit (HOSPITAL_BASED_OUTPATIENT_CLINIC_OR_DEPARTMENT_OTHER): Payer: Self-pay | Admitting: Pharmacist

## 2015-01-16 ENCOUNTER — Ambulatory Visit (HOSPITAL_BASED_OUTPATIENT_CLINIC_OR_DEPARTMENT_OTHER): Payer: Self-pay | Admitting: Oncology

## 2015-01-16 VITALS — BP 164/98 | HR 96 | Temp 98.9°F | Resp 20 | Ht 60.0 in | Wt 178.1 lb

## 2015-01-16 VITALS — BP 128/84 | HR 84

## 2015-01-16 DIAGNOSIS — I2699 Other pulmonary embolism without acute cor pulmonale: Secondary | ICD-10-CM

## 2015-01-16 DIAGNOSIS — Z7901 Long term (current) use of anticoagulants: Secondary | ICD-10-CM

## 2015-01-16 DIAGNOSIS — C169 Malignant neoplasm of stomach, unspecified: Secondary | ICD-10-CM

## 2015-01-16 DIAGNOSIS — Z5112 Encounter for antineoplastic immunotherapy: Secondary | ICD-10-CM

## 2015-01-16 DIAGNOSIS — Z5111 Encounter for antineoplastic chemotherapy: Secondary | ICD-10-CM

## 2015-01-16 DIAGNOSIS — D701 Agranulocytosis secondary to cancer chemotherapy: Secondary | ICD-10-CM

## 2015-01-16 DIAGNOSIS — Z95828 Presence of other vascular implants and grafts: Secondary | ICD-10-CM

## 2015-01-16 DIAGNOSIS — D6481 Anemia due to antineoplastic chemotherapy: Secondary | ICD-10-CM

## 2015-01-16 DIAGNOSIS — E876 Hypokalemia: Secondary | ICD-10-CM

## 2015-01-16 LAB — URINE CULTURE
Colony Count: NO GROWTH
Culture: NO GROWTH

## 2015-01-16 LAB — POCT INR: INR: 1.3

## 2015-01-16 LAB — CBC WITH DIFFERENTIAL/PLATELET
BASO%: 0.6 % (ref 0.0–2.0)
BASOS ABS: 0 10*3/uL (ref 0.0–0.1)
EOS%: 0.9 % (ref 0.0–7.0)
Eosinophils Absolute: 0.1 10*3/uL (ref 0.0–0.5)
HCT: 28.7 % — ABNORMAL LOW (ref 34.8–46.6)
HGB: 9.3 g/dL — ABNORMAL LOW (ref 11.6–15.9)
LYMPH%: 9.7 % — ABNORMAL LOW (ref 14.0–49.7)
MCH: 30.2 pg (ref 25.1–34.0)
MCHC: 32.3 g/dL (ref 31.5–36.0)
MCV: 93.3 fL (ref 79.5–101.0)
MONO#: 0.7 10*3/uL (ref 0.1–0.9)
MONO%: 10.3 % (ref 0.0–14.0)
NEUT#: 5.3 10*3/uL (ref 1.5–6.5)
NEUT%: 78.5 % — AB (ref 38.4–76.8)
Platelets: 259 10*3/uL (ref 145–400)
RBC: 3.08 10*6/uL — ABNORMAL LOW (ref 3.70–5.45)
RDW: 20.5 % — AB (ref 11.2–14.5)
WBC: 6.7 10*3/uL (ref 3.9–10.3)
lymph#: 0.6 10*3/uL — ABNORMAL LOW (ref 0.9–3.3)

## 2015-01-16 LAB — COMPREHENSIVE METABOLIC PANEL (CC13)
ALT: 8 U/L (ref 0–55)
ANION GAP: 7 meq/L (ref 3–11)
AST: 18 U/L (ref 5–34)
Albumin: 2.9 g/dL — ABNORMAL LOW (ref 3.5–5.0)
Alkaline Phosphatase: 122 U/L (ref 40–150)
BUN: 4 mg/dL — ABNORMAL LOW (ref 7.0–26.0)
CHLORIDE: 109 meq/L (ref 98–109)
CO2: 26 mEq/L (ref 22–29)
Calcium: 8.6 mg/dL (ref 8.4–10.4)
Creatinine: 0.7 mg/dL (ref 0.6–1.1)
EGFR: >90 mL/min/{1.73_m2} (ref >90)
Glucose: 117 mg/dl (ref 70–140)
POTASSIUM: 3.4 meq/L — AB (ref 3.5–5.1)
Sodium: 143 mEq/L (ref 136–145)
Total Bilirubin: 0.44 mg/dL (ref 0.20–1.20)
Total Protein: 5.7 g/dL — ABNORMAL LOW (ref 6.4–8.3)

## 2015-01-16 LAB — PROTIME-INR
INR: 1.3 — ABNORMAL LOW (ref 2.00–3.50)
PROTIME: 15.6 s — AB (ref 10.6–13.4)

## 2015-01-16 LAB — UA PROTEIN, DIPSTICK - CHCC: PROTEIN: 30 mg/dL

## 2015-01-16 MED ORDER — ACETAMINOPHEN 325 MG PO TABS
ORAL_TABLET | ORAL | Status: AC
  Filled 2015-01-16: qty 2

## 2015-01-16 MED ORDER — ACETAMINOPHEN 325 MG PO TABS
650.0000 mg | ORAL_TABLET | Freq: Once | ORAL | Status: AC
  Administered 2015-01-16: 650 mg via ORAL

## 2015-01-16 MED ORDER — SODIUM CHLORIDE 0.9 % IV SOLN
Freq: Once | INTRAVENOUS | Status: AC
  Administered 2015-01-16: 13:00:00 via INTRAVENOUS

## 2015-01-16 MED ORDER — HEPARIN SOD (PORK) LOCK FLUSH 100 UNIT/ML IV SOLN
500.0000 [IU] | Freq: Once | INTRAVENOUS | Status: AC
  Administered 2015-01-16: 500 [IU] via INTRAVENOUS
  Filled 2015-01-16: qty 5

## 2015-01-16 MED ORDER — DIPHENHYDRAMINE HCL 50 MG/ML IJ SOLN
25.0000 mg | Freq: Once | INTRAMUSCULAR | Status: AC
  Administered 2015-01-16: 25 mg via INTRAVENOUS

## 2015-01-16 MED ORDER — SODIUM CHLORIDE 0.9 % IV SOLN
Freq: Once | INTRAVENOUS | Status: AC
  Administered 2015-01-16: 14:00:00 via INTRAVENOUS
  Filled 2015-01-16: qty 4

## 2015-01-16 MED ORDER — SODIUM CHLORIDE 0.9 % IJ SOLN
10.0000 mL | INTRAMUSCULAR | Status: DC | PRN
  Administered 2015-01-16: 10 mL
  Filled 2015-01-16: qty 10

## 2015-01-16 MED ORDER — SODIUM CHLORIDE 0.9 % IJ SOLN
10.0000 mL | INTRAMUSCULAR | Status: AC | PRN
  Administered 2015-01-16: 10 mL via INTRAVENOUS
  Filled 2015-01-16: qty 10

## 2015-01-16 MED ORDER — SODIUM CHLORIDE 0.9 % IV SOLN
8.0000 mg/kg | Freq: Once | INTRAVENOUS | Status: AC
  Administered 2015-01-16: 600 mg via INTRAVENOUS
  Filled 2015-01-16: qty 60

## 2015-01-16 MED ORDER — FAMOTIDINE IN NACL 20-0.9 MG/50ML-% IV SOLN
20.0000 mg | Freq: Once | INTRAVENOUS | Status: AC
  Administered 2015-01-16: 20 mg via INTRAVENOUS

## 2015-01-16 MED ORDER — HEPARIN SOD (PORK) LOCK FLUSH 100 UNIT/ML IV SOLN
500.0000 [IU] | Freq: Once | INTRAVENOUS | Status: AC | PRN
  Administered 2015-01-16: 500 [IU]
  Filled 2015-01-16: qty 5

## 2015-01-16 MED ORDER — FAMOTIDINE IN NACL 20-0.9 MG/50ML-% IV SOLN
INTRAVENOUS | Status: AC
  Filled 2015-01-16: qty 50

## 2015-01-16 MED ORDER — PACLITAXEL CHEMO INJECTION 300 MG/50ML
80.0000 mg/m2 | Freq: Once | INTRAVENOUS | Status: AC
  Administered 2015-01-16: 144 mg via INTRAVENOUS
  Filled 2015-01-16: qty 24

## 2015-01-16 MED ORDER — DIPHENHYDRAMINE HCL 50 MG/ML IJ SOLN
INTRAMUSCULAR | Status: AC
  Filled 2015-01-16: qty 1

## 2015-01-16 NOTE — Patient Instructions (Signed)

## 2015-01-16 NOTE — [facility] Oncology Navigator Note (Signed)
Oncology Nurse Navigator Documentation  Oncology Nurse Navigator Flowsheets 01/16/2015  Navigator Encounter Type Initial contact   Patient Visit Type Medonc  Treatment Phase Treatment   Barriers/Navigation Needs No barriers at this time-interpreter present for appointment. Patient denies any unmet needs at this time.  Time Spent with Patient 10

## 2015-01-16 NOTE — Telephone Encounter (Signed)
Pt confirmed labs/ov per 06/02 POF, gave pt AVS and Calendar, sent msg to add chemo... KJ

## 2015-01-16 NOTE — Progress Notes (Signed)
INR below goal today at 1.3 (goal 2-3) Pt recently discharged from hospital Coumadin was held due to possibility of surgery while in hospital for obstruction No obstruction observed so surgery was canceled Coumadin resumed last night on 6/1 Pt also started on 3 days course of Cipro (starting 6/1) This can increase the effects of coumadin Since INR is subtherapeutic and recent hematuria while on heparin in the hospital will not make any changes at this time Will monitor closely and recheck next week with scheduled appointments Pt here to be seen by Dr. Benay Spice and to receive chemo on Friday Plan: Continue Coumadin 4mg  daily except 6 mg on Tuesday, Thursday, and Saturday Recheck INR next week with scheduled appointments on 6/9

## 2015-01-16 NOTE — Progress Notes (Signed)
[facility] OFFICE PROGRESS NOTE   Diagnosis: Gastric cancer  INTERVAL HISTORY:   She was admitted 01/08/2015 with nausea/vomiting. A CT was consistent with a small bowel obstruction. The obstructive symptoms resolved with bowel rest. She was tolerating a diet and having bowel movements at discharge. She developed gross hematuria on the day prior to discharge. She has been scheduled for a urology appointment. No further hematuria.  Alicia [name] continues to have mid upper abdominal pain. The pain is relieved with Dilaudid. She reports significant improvement in peripheral numbness.  Objective:  Vital signs in last 24 hours:  Blood pressure 164/98, pulse 96, temperature 98.9 F (37.2 C), temperature source Oral, resp. rate 20, height 5' (1.524 m), weight 178 lb 1.6 oz (80.786 kg).    HEENT: No thrush or ulcers Resp: Lungs clear bilaterally Cardio: Regular rate and rhythm GI: No hepatomegaly, masslike fullness around the umbilicus, tender in the mid upper abdomen, no apparent ascites Vascular: No leg edema Neuro: The vibratory sense is intact at the fingertips bilaterally   Portacath/PICC-without erythema  Lab Results:  Lab Results  Component Value Date   WBC 6.7 01/16/2015   HGB 9.3* 01/16/2015   HCT 28.7* 01/16/2015   MCV 93.3 01/16/2015   PLT 259 01/16/2015   NEUTROABS 5.3 01/16/2015    PT/INR 1.3  Lab Results  Component Value Date   CEA 5.8* 12/17/2014    Imaging:  Dg Abd Portable 1v  01/15/2015   CLINICAL DATA:  Abdominal pain with vomiting and diarrhea. History of gastric carcinoma  EXAM: PORTABLE ABDOMEN - 1 VIEW  COMPARISON:  Jan 11, 2015 and July 10, 2014  FINDINGS: On the current examination, there is no bowel dilatation or air-fluid level suggesting obstruction. No free air. There are surgical clips the pelvis. A sclerotic focus in the right iliac crest is stable, not present on prior study July 10, 2014  IMPRESSION: Bowel gas  pattern unremarkable without obstruction or free air on this supine examination. Multiple surgical clips in pelvis. Sclerotic focus in the right iliac crest, not present 7 months prior. Concern for sclerotic bony metastasis.   Electronically Signed   By: Lowella Grip III M.D.   On: 01/15/2015 13:45    Medications: I have reviewed the patient's current medications.  Assessment/Plan: 1. Stage IV gastric cancer  Status post bilateral oophorectomy, total abdominal hysterectomy, omentectomy, resection of abdominal wall and pelvic masses, bilateral pelvic lymphadenectomy 10/18/2013 with the pathology confirming moderate to poorly differentiated adenocarcinoma  Stomach biopsy 11/28/2013 revealed adenocarcinoma  Status post 12 cycles of FOLFOX, last given 04/25/2014  Restaging CT 04/29/2014 with thickening of the stomach and nodular densities adjacent to the greater curvature-unchanged, faint groundglass nodularity in the lungs-nonspecific  Restaging CT 07/22/2014 with indeterminate lung nodules; moderate wall thickening in the body region of the stomach; fairly extensive omental disease and findings suspicious for abdominal wall and incisional disease.  Started systemic chemotherapy with FOLFIRI on 07/30/14.  Restaging CT evaluation 10/14/2014 following 5 cycles of FOLFIRI showed decreased nodularity in both lungs; overall stable omental nodularity; involution of the previously demonstrated ill-defined soft tissue mass involving the anterior abdominal wall incision.  Cycle 6 FOLFIRI 10/15/2014  Cycle 7 FOLFIRI 10/29/2014  Cycle 8 FOLFIRI 11/12/2014  Cycle 9 FOLFIRI 11/26/2014 (dose reduction of 5-fluorouracil due to mucositis)  CT abdomen/pelvis 12/05/2014 revealed mild progression of peritoneal/omental tumor  Initiation of weekly Taxol (3 weeks on/1 week off) and every 2 week Ramucirumab 12/17/2014   2. Oxaliplatin neuropathy  3. Bilateral upper and lower lobe pulmonary  emboli within segmental branches-now maintained on Coumadin. Followed by Coumadin clinic at the [facility].   4. Neutropenia related to chemotherapy 08/13/2014. Cycle 2 FOLFIRI held. Neulasta added beginning with cycle 2 on 08/20/2014.   5. Nausea following chemotherapy-the anti-emetic regimen was adjusted with cycle 4 FOLFIRI 09/17/2014   6. Diarrhea following FOLFIRI-no improvement with Imodium. Lomotil initiated 10/15/2014.    7. Mouth sores following cycle 8 FOLFIRI. 5 fluorouracil dose reduced.   8. Admission 12/05/2014 with nausea/vomiting and diarrhea-likely secondary to FOLFIRI chemotherapy   9. Abdominal pain secondary to carcinomatosis and gastric tumor   10. 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 Admission 01/09/2015 with abdominal pain and nausea/vomiting-a CT confirmed a distal small bowel obstruction. Resolved with bowel rest.    Disposition:  The bowel obstruction symptoms have resolved. The plan is to continue weekly Taxol/ramucirumab today. Neuropathy symptoms have improved significantly compared to when she was here on 12/31/2014. Taxol will be resumed and we will monitor for recurrent neuropathy. She will continue follow-up at the [facility] clinic for management of Coumadin anticoagulation.  Alicia Coder, MD  01/16/2015  12:20 PM

## 2015-01-16 NOTE — Patient Instructions (Signed)
INR below goal due to holding coumadin while in hospital  Continue Coumadin 4mg  daily except 6 mg on Tuesday, Thursday, and Saturday Recheck INR next week with scheduled appointments

## 2015-01-17 ENCOUNTER — Telehealth: Payer: Self-pay | Admitting: *Deleted

## 2015-01-17 ENCOUNTER — Ambulatory Visit: Payer: Self-pay

## 2015-01-17 NOTE — Telephone Encounter (Signed)
Per staff message and POF I have scheduled appts. Advised scheduler of appts. JMW  

## 2015-01-18 ENCOUNTER — Other Ambulatory Visit: Payer: Self-pay | Admitting: Oncology

## 2015-01-23 ENCOUNTER — Other Ambulatory Visit (HOSPITAL_BASED_OUTPATIENT_CLINIC_OR_DEPARTMENT_OTHER): Payer: Self-pay

## 2015-01-23 ENCOUNTER — Ambulatory Visit (HOSPITAL_BASED_OUTPATIENT_CLINIC_OR_DEPARTMENT_OTHER): Payer: Self-pay | Admitting: Nurse Practitioner

## 2015-01-23 ENCOUNTER — Ambulatory Visit (HOSPITAL_BASED_OUTPATIENT_CLINIC_OR_DEPARTMENT_OTHER): Payer: Self-pay

## 2015-01-23 VITALS — BP 120/99 | HR 95 | Temp 97.8°F | Resp 18 | Ht 60.0 in | Wt 165.7 lb

## 2015-01-23 DIAGNOSIS — C169 Malignant neoplasm of stomach, unspecified: Secondary | ICD-10-CM

## 2015-01-23 DIAGNOSIS — Z7901 Long term (current) use of anticoagulants: Secondary | ICD-10-CM

## 2015-01-23 DIAGNOSIS — K5900 Constipation, unspecified: Secondary | ICD-10-CM

## 2015-01-23 DIAGNOSIS — D6481 Anemia due to antineoplastic chemotherapy: Secondary | ICD-10-CM

## 2015-01-23 DIAGNOSIS — Z5111 Encounter for antineoplastic chemotherapy: Secondary | ICD-10-CM

## 2015-01-23 DIAGNOSIS — G620 Drug-induced polyneuropathy: Secondary | ICD-10-CM

## 2015-01-23 DIAGNOSIS — I2699 Other pulmonary embolism without acute cor pulmonale: Secondary | ICD-10-CM

## 2015-01-23 LAB — CBC WITH DIFFERENTIAL/PLATELET
BASO%: 0.5 % (ref 0.0–2.0)
BASOS ABS: 0 10*3/uL (ref 0.0–0.1)
EOS%: 1.4 % (ref 0.0–7.0)
Eosinophils Absolute: 0.1 10*3/uL (ref 0.0–0.5)
HCT: 30.5 % — ABNORMAL LOW (ref 34.8–46.6)
HEMOGLOBIN: 9.5 g/dL — AB (ref 11.6–15.9)
LYMPH%: 19 % (ref 14.0–49.7)
MCH: 28.9 pg (ref 25.1–34.0)
MCHC: 31.1 g/dL — ABNORMAL LOW (ref 31.5–36.0)
MCV: 92.7 fL (ref 79.5–101.0)
MONO#: 0.6 10*3/uL (ref 0.1–0.9)
MONO%: 9.8 % (ref 0.0–14.0)
NEUT#: 4 10*3/uL (ref 1.5–6.5)
NEUT%: 69.3 % (ref 38.4–76.8)
Platelets: 290 10*3/uL (ref 145–400)
RBC: 3.29 10*6/uL — AB (ref 3.70–5.45)
RDW: 18.8 % — AB (ref 11.2–14.5)
WBC: 5.8 10*3/uL (ref 3.9–10.3)
lymph#: 1.1 10*3/uL (ref 0.9–3.3)
nRBC: 0 % (ref 0–0)

## 2015-01-23 LAB — PROTIME-INR
INR: 3.4 (ref 2.00–3.50)
Protime: 40.8 Seconds — ABNORMAL HIGH (ref 10.6–13.4)

## 2015-01-23 LAB — POCT INR: INR: 3.4

## 2015-01-23 MED ORDER — SODIUM CHLORIDE 0.9 % IV SOLN
Freq: Once | INTRAVENOUS | Status: AC
  Administered 2015-01-23: 12:00:00 via INTRAVENOUS

## 2015-01-23 MED ORDER — DIPHENHYDRAMINE HCL 50 MG/ML IJ SOLN
INTRAMUSCULAR | Status: AC
  Filled 2015-01-23: qty 1

## 2015-01-23 MED ORDER — DIPHENHYDRAMINE HCL 50 MG/ML IJ SOLN
25.0000 mg | Freq: Once | INTRAMUSCULAR | Status: AC
  Administered 2015-01-23: 25 mg via INTRAVENOUS

## 2015-01-23 MED ORDER — SODIUM CHLORIDE 0.9 % IJ SOLN
10.0000 mL | INTRAMUSCULAR | Status: DC | PRN
  Administered 2015-01-23: 10 mL
  Filled 2015-01-23: qty 10

## 2015-01-23 MED ORDER — FAMOTIDINE IN NACL 20-0.9 MG/50ML-% IV SOLN
20.0000 mg | Freq: Once | INTRAVENOUS | Status: AC
  Administered 2015-01-23: 20 mg via INTRAVENOUS

## 2015-01-23 MED ORDER — PACLITAXEL CHEMO INJECTION 300 MG/50ML
80.0000 mg/m2 | Freq: Once | INTRAVENOUS | Status: AC
  Administered 2015-01-23: 144 mg via INTRAVENOUS
  Filled 2015-01-23: qty 24

## 2015-01-23 MED ORDER — HEPARIN SOD (PORK) LOCK FLUSH 100 UNIT/ML IV SOLN
500.0000 [IU] | Freq: Once | INTRAVENOUS | Status: AC | PRN
  Administered 2015-01-23: 500 [IU]
  Filled 2015-01-23: qty 5

## 2015-01-23 MED ORDER — FAMOTIDINE IN NACL 20-0.9 MG/50ML-% IV SOLN
INTRAVENOUS | Status: AC
  Filled 2015-01-23: qty 50

## 2015-01-23 MED ORDER — SODIUM CHLORIDE 0.9 % IV SOLN
Freq: Once | INTRAVENOUS | Status: AC
  Administered 2015-01-23: 13:00:00 via INTRAVENOUS
  Filled 2015-01-23: qty 4

## 2015-01-23 NOTE — Progress Notes (Signed)
Ok to proceed with CMP on 01/16/15

## 2015-01-23 NOTE — Patient Instructions (Signed)
[facility] Discharge Instructions for Patients Receiving Chemotherapy  Today you received the following chemotherapy agents Paclitaxel.   To help prevent nausea and vomiting after your treatment, we encourage you to take your nausea medication as directed.    If you develop nausea and vomiting that is not controlled by your nausea medication, call the clinic.   BELOW ARE SYMPTOMS THAT SHOULD BE REPORTED IMMEDIATELY:  *FEVER GREATER THAN 100.5 F  *CHILLS WITH OR WITHOUT FEVER  NAUSEA AND VOMITING THAT IS NOT CONTROLLED WITH YOUR NAUSEA MEDICATION  *UNUSUAL SHORTNESS OF BREATH  *UNUSUAL BRUISING OR BLEEDING  TENDERNESS IN MOUTH AND THROAT WITH OR WITHOUT PRESENCE OF ULCERS  *URINARY PROBLEMS  *BOWEL PROBLEMS  UNUSUAL RASH Items with * indicate a potential emergency and should be followed up as soon as possible.  Feel free to call the clinic you have any questions or concerns. The clinic phone number is [phone].  Please show the CHEMO ALERT CARD at check-in to the Emergency Department and triage nurse.   

## 2015-01-23 NOTE — Patient Instructions (Signed)
Begin Miralax 17 grams daily for constipation Begin Senokot S 2 tabs twice daily for constipation Call if constipation persists

## 2015-01-23 NOTE — Progress Notes (Signed)
[facility] OFFICE PROGRESS NOTE   Diagnosis:  Gastric cancer  INTERVAL HISTORY:   Alicia [name] returns as scheduled. She was last treated with Taxol and Ramucirumab 01/16/2015. She denies nausea/vomiting. She had a few mouth sores. No diarrhea. She has been constipated for the past few days. She recently noted a small amount of blood after straining for a bowel movement. Abdominal pain is well-controlled with pain medication. Numbness in the fingertips and feet continues to be improved. She denies any shortness of breath or chest pain. No leg swelling or calf pain.  Objective:  Vital signs in last 24 hours:  Blood pressure 120/99, pulse 95, temperature 97.8 F (36.6 C), temperature source Oral, resp. rate 18, height 5' (1.524 m), weight 165 lb 11.2 oz (75.161 kg), SpO2 100 %.    HEENT: No thrush or ulcers. Resp: Lungs clear bilaterally. Cardio: Regular rate and rhythm. GI: Abdomen is soft, nontender. No hepatomegaly. Masslike fullness around the umbilicus. Vascular: No leg edema. Soft and nontender. Port-A-Cath without erythema.   Lab Results:  Lab Results  Component Value Date   WBC 5.8 01/23/2015   HGB 9.5* 01/23/2015   HCT 30.5* 01/23/2015   MCV 92.7 01/23/2015   PLT 290 01/23/2015   NEUTROABS 4.0 01/23/2015    Imaging:  No results found.  Medications: I have reviewed the patient's current medications.  Assessment/Plan:  1. Stage IV gastric cancer  Status post bilateral oophorectomy, total abdominal hysterectomy, omentectomy, resection of abdominal wall and pelvic masses, bilateral pelvic lymphadenectomy 10/18/2013 with the pathology confirming moderate to poorly differentiated adenocarcinoma  Stomach biopsy 11/28/2013 revealed adenocarcinoma  Status post 12 cycles of FOLFOX, last given 04/25/2014  Restaging CT 04/29/2014 with thickening of the stomach and nodular densities adjacent to the greater curvature-unchanged, faint groundglass  nodularity in the lungs-nonspecific  Restaging CT 07/22/2014 with indeterminate lung nodules; moderate wall thickening in the body region of the stomach; fairly extensive omental disease and findings suspicious for abdominal wall and incisional disease.  Started systemic chemotherapy with FOLFIRI on 07/30/14.  Restaging CT evaluation 10/14/2014 following 5 cycles of FOLFIRI showed decreased nodularity in both lungs; overall stable omental nodularity; involution of the previously demonstrated ill-defined soft tissue mass involving the anterior abdominal wall incision.  Cycle 6 FOLFIRI 10/15/2014  Cycle 7 FOLFIRI 10/29/2014  Cycle 8 FOLFIRI 11/12/2014  Cycle 9 FOLFIRI 11/26/2014 (dose reduction of 5-fluorouracil due to mucositis)  CT abdomen/pelvis 12/05/2014 revealed mild progression of peritoneal/omental tumor  Initiation of weekly Taxol (3 weeks on/1 week off) and every 2 week Ramucirumab 12/17/2014   2. Oxaliplatin neuropathy   3. Bilateral upper and lower lobe pulmonary emboli within segmental branches-now maintained on Coumadin. Followed by Coumadin clinic at the [facility].   4. Neutropenia related to chemotherapy 08/13/2014. Cycle 2 FOLFIRI held. Neulasta added beginning with cycle 2 on 08/20/2014.   5. Nausea following chemotherapy-the anti-emetic regimen was adjusted with cycle 4 FOLFIRI 09/17/2014   6. Diarrhea following FOLFIRI-no improvement with Imodium. Lomotil initiated 10/15/2014.    7. Mouth sores following cycle 8 FOLFIRI. 5 fluorouracil dose reduced.   8. Admission 12/05/2014 with nausea/vomiting and diarrhea-likely secondary to FOLFIRI chemotherapy   9. Abdominal pain secondary to carcinomatosis and gastric tumor   10. 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 Admission 01/09/2015 with abdominal pain and nausea/vomiting-a CT confirmed a distal small bowel obstruction. Resolved with bowel  rest.     Disposition: Alicia [name] appears stable. Plan to proceed with Taxol today  as scheduled.   She was given instructions to begin miralax 17 g daily and Senokot-S 2 tablets twice daily for the constipation. She understands to contact the office if this is not effective.  Her blood pressure has been elevated intermittently over the past several months. We will consider adding an anti-hypertensive if there is consistent elevation.  She will return for a follow-up visit and Taxol/ramucirumab in one week. She will contact the office in the interim as outlined above or with any other problems.     Ned Card ANP/GNP-BC   01/23/2015  11:33 AM

## 2015-01-23 NOTE — Progress Notes (Signed)
[name] is 3.4 today which is slightly above her goal range of 2-3. [name] is significantly higher from her visit last week which was 1.3 She has been taking 4 mg daily except 6 mg on T/Th/Sat. She reports no extra and/or missed doses. No bleeding and/or unusual bruising reported except one incidence of some blood in the stool after straining and some minor gingival bleeding when brushing her teeth. She has had no recent medication changes - she states she is still taking the ciprofloxacin.  Alicia [name] denies any dietary changes and reports no issues with her anticoagulation.  She asked when she can come off of her anticoagulation as she really does not like this drug, and I told her that decision is up to Dr. Benay Spice and advised her to ask him about the anticoagulation plan at her next appointment.  Her elevated [name] is likely due to the interaction between warfarin and ciprofloxacin. We will decrease her weekly dose slightly given her significant rise in [name] and recheck [name] in 1 week.  Plan: Decrease Coumadin to 4 mg daily except 6 mg on Tuesdays. Recheck [name] in 1 week with other appointments on 6/16; we will see her infusion

## 2015-01-26 ENCOUNTER — Other Ambulatory Visit: Payer: Self-pay | Admitting: Oncology

## 2015-01-30 ENCOUNTER — Ambulatory Visit (HOSPITAL_BASED_OUTPATIENT_CLINIC_OR_DEPARTMENT_OTHER): Payer: Self-pay

## 2015-01-30 ENCOUNTER — Ambulatory Visit (HOSPITAL_BASED_OUTPATIENT_CLINIC_OR_DEPARTMENT_OTHER): Payer: Self-pay | Admitting: Pharmacist

## 2015-01-30 ENCOUNTER — Other Ambulatory Visit (HOSPITAL_BASED_OUTPATIENT_CLINIC_OR_DEPARTMENT_OTHER): Payer: Self-pay

## 2015-01-30 ENCOUNTER — Telehealth: Payer: Self-pay | Admitting: *Deleted

## 2015-01-30 ENCOUNTER — Telehealth: Payer: Self-pay | Admitting: Nurse Practitioner

## 2015-01-30 ENCOUNTER — Ambulatory Visit: Payer: Self-pay

## 2015-01-30 ENCOUNTER — Encounter: Payer: Self-pay | Admitting: *Deleted

## 2015-01-30 ENCOUNTER — Ambulatory Visit (HOSPITAL_BASED_OUTPATIENT_CLINIC_OR_DEPARTMENT_OTHER): Payer: Self-pay | Admitting: Nurse Practitioner

## 2015-01-30 VITALS — BP 158/90

## 2015-01-30 VITALS — BP 159/108 | HR 103 | Temp 98.8°F | Resp 18 | Ht 60.0 in | Wt 157.1 lb

## 2015-01-30 DIAGNOSIS — D6481 Anemia due to antineoplastic chemotherapy: Secondary | ICD-10-CM

## 2015-01-30 DIAGNOSIS — C7989 Secondary malignant neoplasm of other specified sites: Secondary | ICD-10-CM

## 2015-01-30 DIAGNOSIS — Z5111 Encounter for antineoplastic chemotherapy: Secondary | ICD-10-CM

## 2015-01-30 DIAGNOSIS — C169 Malignant neoplasm of stomach, unspecified: Secondary | ICD-10-CM

## 2015-01-30 DIAGNOSIS — Z5112 Encounter for antineoplastic immunotherapy: Secondary | ICD-10-CM

## 2015-01-30 DIAGNOSIS — Z7901 Long term (current) use of anticoagulants: Secondary | ICD-10-CM

## 2015-01-30 DIAGNOSIS — Z95828 Presence of other vascular implants and grafts: Secondary | ICD-10-CM

## 2015-01-30 DIAGNOSIS — G620 Drug-induced polyneuropathy: Secondary | ICD-10-CM

## 2015-01-30 DIAGNOSIS — R110 Nausea: Secondary | ICD-10-CM

## 2015-01-30 DIAGNOSIS — F329 Major depressive disorder, single episode, unspecified: Secondary | ICD-10-CM

## 2015-01-30 LAB — COMPREHENSIVE METABOLIC PANEL (CC13)
ALT: 8 U/L (ref 0–55)
ANION GAP: 7 meq/L (ref 3–11)
AST: 12 U/L (ref 5–34)
Albumin: 3 g/dL — ABNORMAL LOW (ref 3.5–5.0)
Alkaline Phosphatase: 102 U/L (ref 40–150)
BUN: 5.7 mg/dL — AB (ref 7.0–26.0)
CALCIUM: 8.9 mg/dL (ref 8.4–10.4)
CHLORIDE: 103 meq/L (ref 98–109)
CO2: 28 meq/L (ref 22–29)
CREATININE: 0.7 mg/dL (ref 0.6–1.1)
EGFR: >90 mL/min/{1.73_m2} (ref >90)
Glucose: 108 mg/dl (ref 70–140)
Potassium: 3.6 mEq/L (ref 3.5–5.1)
Sodium: 138 mEq/L (ref 136–145)
Total Bilirubin: 0.74 mg/dL (ref 0.20–1.20)
Total Protein: 6.2 g/dL — ABNORMAL LOW (ref 6.4–8.3)

## 2015-01-30 LAB — CBC WITH DIFFERENTIAL/PLATELET
BASO%: 1.2 % (ref 0.0–2.0)
Basophils Absolute: 0 10*3/uL (ref 0.0–0.1)
EOS%: 1.3 % (ref 0.0–7.0)
Eosinophils Absolute: 0 10*3/uL (ref 0.0–0.5)
HCT: 28.5 % — ABNORMAL LOW (ref 34.8–46.6)
HGB: 9.3 g/dL — ABNORMAL LOW (ref 11.6–15.9)
LYMPH#: 0.9 10*3/uL (ref 0.9–3.3)
LYMPH%: 25.1 % (ref 14.0–49.7)
MCH: 29.3 pg (ref 25.1–34.0)
MCHC: 32.5 g/dL (ref 31.5–36.0)
MCV: 90.1 fL (ref 79.5–101.0)
MONO#: 0.4 10*3/uL (ref 0.1–0.9)
MONO%: 9.9 % (ref 0.0–14.0)
NEUT#: 2.3 10*3/uL (ref 1.5–6.5)
NEUT%: 62.5 % (ref 38.4–76.8)
PLATELETS: 288 10*3/uL (ref 145–400)
RBC: 3.16 10*6/uL — ABNORMAL LOW (ref 3.70–5.45)
RDW: 20.7 % — ABNORMAL HIGH (ref 11.2–14.5)
WBC: 3.7 10*3/uL — AB (ref 3.9–10.3)

## 2015-01-30 LAB — PROTIME-INR
INR: 3 (ref 2.00–3.50)
Protime: 36 Seconds — ABNORMAL HIGH (ref 10.6–13.4)

## 2015-01-30 LAB — POCT INR: INR: 3

## 2015-01-30 MED ORDER — SODIUM CHLORIDE 0.9 % IJ SOLN
10.0000 mL | INTRAMUSCULAR | Status: DC | PRN
  Administered 2015-01-30: 10 mL via INTRAVENOUS
  Filled 2015-01-30: qty 10

## 2015-01-30 MED ORDER — HEPARIN SOD (PORK) LOCK FLUSH 100 UNIT/ML IV SOLN
500.0000 [IU] | Freq: Once | INTRAVENOUS | Status: AC | PRN
  Administered 2015-01-30: 500 [IU]
  Filled 2015-01-30: qty 5

## 2015-01-30 MED ORDER — FAMOTIDINE IN NACL 20-0.9 MG/50ML-% IV SOLN
20.0000 mg | Freq: Once | INTRAVENOUS | Status: AC
  Administered 2015-01-30: 20 mg via INTRAVENOUS

## 2015-01-30 MED ORDER — DIPHENHYDRAMINE HCL 50 MG/ML IJ SOLN
INTRAMUSCULAR | Status: AC
  Filled 2015-01-30: qty 1

## 2015-01-30 MED ORDER — LORAZEPAM 0.5 MG PO TABS: 0.5000 mg | ORAL_TABLET | Freq: Three times a day (TID) | ORAL | Status: DC | PRN

## 2015-01-30 MED ORDER — SODIUM CHLORIDE 0.9 % IV SOLN
8.0000 mg/kg | Freq: Once | INTRAVENOUS | Status: AC
  Administered 2015-01-30: 600 mg via INTRAVENOUS
  Filled 2015-01-30: qty 60

## 2015-01-30 MED ORDER — ACETAMINOPHEN 325 MG PO TABS
ORAL_TABLET | ORAL | Status: AC
Start: 2015-01-30 — End: 2015-01-30
  Filled 2015-01-30: qty 2

## 2015-01-30 MED ORDER — SODIUM CHLORIDE 0.9 % IJ SOLN
10.0000 mL | INTRAMUSCULAR | Status: DC | PRN
  Administered 2015-01-30: 10 mL
  Filled 2015-01-30: qty 10

## 2015-01-30 MED ORDER — PACLITAXEL CHEMO INJECTION 300 MG/50ML
80.0000 mg/m2 | Freq: Once | INTRAVENOUS | Status: AC
  Administered 2015-01-30: 144 mg via INTRAVENOUS
  Filled 2015-01-30: qty 24

## 2015-01-30 MED ORDER — SODIUM CHLORIDE 0.9 % IV SOLN
Freq: Once | INTRAVENOUS | Status: AC
  Administered 2015-01-30: 13:00:00 via INTRAVENOUS

## 2015-01-30 MED ORDER — SODIUM CHLORIDE 0.9 % IV SOLN
Freq: Once | INTRAVENOUS | Status: AC
  Administered 2015-01-30: 14:00:00 via INTRAVENOUS
  Filled 2015-01-30: qty 4

## 2015-01-30 MED ORDER — ACETAMINOPHEN 325 MG PO TABS
650.0000 mg | ORAL_TABLET | Freq: Once | ORAL | Status: AC
  Administered 2015-01-30: 650 mg via ORAL

## 2015-01-30 MED ORDER — DIPHENHYDRAMINE HCL 50 MG/ML IJ SOLN
25.0000 mg | Freq: Once | INTRAMUSCULAR | Status: AC
  Administered 2015-01-30: 25 mg via INTRAVENOUS

## 2015-01-30 NOTE — Patient Instructions (Signed)

## 2015-01-30 NOTE — Telephone Encounter (Signed)
Per staff message and POF I have scheduled appts. Advised scheduler of appts. JMW  

## 2015-01-30 NOTE — Progress Notes (Signed)
INR = 3     Goal 2-3 INR within goal range. Patient seen today with translator. She states that her gums have been bleeding when she brushes her teeth. No other bleeding or bruising noted. For this reason, I will decrease her dose slightly. Decrease Coumadin to 4mg  daily. Recheck INR next week with scheduled appointments on 6/30. Lab at 9:15, Coumadin clinic at 9:30 and Dr. Benay Spice at 10:00.  Theone Murdoch, PharmD

## 2015-01-30 NOTE — Progress Notes (Signed)
[facility] OFFICE PROGRESS NOTE   Diagnosis:  Gastric cancer  INTERVAL HISTORY:   Alicia [name] returns as scheduled. She was last treated with Taxol 01/23/2015. She reports a decreased energy level. She feels sad and cries periodically. She is currently on Zoloft 100 mg daily. When questioned she reports there are problems at home. She did not elaborate. She denies suicidal ideation. She initially denied nausea. She then reported some nausea after eating. No vomiting. No mouth sores. No diarrhea. No rash. No shortness of breath. Appetite is diminished. She has lost some weight. She denies any bleeding except gum bleeding with brushing. She has stable numbness in the fingertips and feet. Abdominal pain is controlled with medications.  Objective:  Vital signs in last 24 hours:  Blood pressure 171/93, pulse 103, temperature 98.8 F (37.1 C), temperature source Oral, resp. rate 18, height 5' (1.524 m), weight 157 lb 1.6 oz (71.26 kg), SpO2 100 %.    HEENT: No thrush or ulcers. Resp: Lungs clear bilaterally. Cardio: Regular rate and rhythm. GI: Abdomen soft and nontender. No hepatomegaly. Stable to improved masslike fullness around the umbilicus. Vascular: No leg edema. Calves soft and nontender.  Skin: No rash. Port-A-Cath without erythema.    Lab Results:  Lab Results  Component Value Date   WBC 3.7* 01/30/2015   HGB 9.3* 01/30/2015   HCT 28.5* 01/30/2015   MCV 90.1 01/30/2015   PLT 288 01/30/2015   NEUTROABS 2.3 01/30/2015    Imaging:  No results found.  Medications: I have reviewed the patient's current medications.  Assessment/Plan: 1. Stage IV gastric cancer  Status post bilateral oophorectomy, total abdominal hysterectomy, omentectomy, resection of abdominal wall and pelvic masses, bilateral pelvic lymphadenectomy 10/18/2013 with the pathology confirming moderate to poorly differentiated adenocarcinoma  Stomach biopsy 11/28/2013 revealed  adenocarcinoma  Status post 12 cycles of FOLFOX, last given 04/25/2014  Restaging CT 04/29/2014 with thickening of the stomach and nodular densities adjacent to the greater curvature-unchanged, faint groundglass nodularity in the lungs-nonspecific  Restaging CT 07/22/2014 with indeterminate lung nodules; moderate wall thickening in the body region of the stomach; fairly extensive omental disease and findings suspicious for abdominal wall and incisional disease.  Started systemic chemotherapy with FOLFIRI on 07/30/14.  Restaging CT evaluation 10/14/2014 following 5 cycles of FOLFIRI showed decreased nodularity in both lungs; overall stable omental nodularity; involution of the previously demonstrated ill-defined soft tissue mass involving the anterior abdominal wall incision.  Cycle 6 FOLFIRI 10/15/2014  Cycle 7 FOLFIRI 10/29/2014  Cycle 8 FOLFIRI 11/12/2014  Cycle 9 FOLFIRI 11/26/2014 (dose reduction of 5-fluorouracil due to mucositis)  CT abdomen/pelvis 12/05/2014 revealed mild progression of peritoneal/omental tumor  Initiation of weekly Taxol (3 weeks on/1 week off) and every 2 week Ramucirumab 12/17/2014  Cycle 2 Taxol/Ramucirumab beginning 01/16/2015   2. Oxaliplatin neuropathy   3. Bilateral upper and lower lobe pulmonary emboli within segmental branches-now maintained on Coumadin. Followed by Coumad[facility] clinic at the [facility].   4. Neutropenia related to chemotherapy 08/13/2014. Cycle 2 FOLFIRI held. Neulasta added beginning with cycle 2 on 08/20/2014.   5. Nausea following chemotherapy-the anti-emetic regimen was adjusted with cycle 4 FOLFIRI 09/17/2014   6. Diarrhea following FOLFIRI-no improvement with Imodium. Lomotil initiated 10/15/2014.    7. Mouth sores following cycle 8 FOLFIRI. 5 fluorouracil dose reduced.   8. Admission 12/05/2014 with nausea/vomiting and diarrhea-likely secondary to FOLFIRI chemotherapy   9. Abdominal pain  secondary to carcinomatosis and gastric tumor   10. Anemia secondary to chemotherapy  and chronic disease   11. Admission 01/09/2015 with abdominal pain and nausea/vomiting-a CT confirmed a distal small bowel obstruction. Resolved with bowel rest.     12. Depression. She is currently on Zoloft 100 mg daily.    Disposition: Ms. Lengyel appear stable from an oncology standpoint. Plan to proceed with Taxol/Ramucirumab today as scheduled to complete cycle 2. We will follow-up on the CEA from today.  For the nausea she will try Ativan 0.5 mg every 8 hours as needed.  She appears to be depressed. She is currently on Zoloft 100 mg daily. We will ask our social worker to meet with her today. We will also contact her PCP for assistance with management of the depression.  She will return for a follow-up visit and cycle 3 Taxol/Ramucirumab 02/13/2015. She will contact the office in the interim with any problems.    Ned Card ANP/GNP-BC   01/30/2015  11:36 AM

## 2015-01-30 NOTE — Telephone Encounter (Signed)
Pt confirmed labs/ov per 06/16 POF, gave pt AVS and Calender....... KJ, sent msg to add chemo °

## 2015-01-30 NOTE — Progress Notes (Signed)
[facility] Work  Clinical Social Work was referred by nurse for assessment of psychosocial needs due to emotional concerns, possible depression.  Clinical Social Worker attempted to meet with pt during chemo, but pt sound asleep and interpreter not present. CSW left note with CSW contact and information regarding Pt and Family Support Team. CSW let chemo RN know CSW had left note. CSW has patient on CSW schedule to see on 02/13/15 to offer support and assess for needs.  CSW will try to contact pt via phone aided by interpreter next week.   Loren Racer, [place] Worker [place]  [place] Phone: [phone] Fax: [phone]

## 2015-01-30 NOTE — Patient Instructions (Signed)
[facility] Discharge Instructions for Patients Receiving Chemotherapy  Today you received the following chemotherapy agents Cyramza  To help prevent nausea and vomiting after your treatment, we encourage you to take your nausea medication Compazine 10mg  every 6 hours as needed. If you develop nausea and vomiting that is not controlled by your nausea medication, call the clinic.   BELOW ARE SYMPTOMS THAT SHOULD BE REPORTED IMMEDIATELY:  *FEVER GREATER THAN 100.5 F  *CHILLS WITH OR WITHOUT FEVER  NAUSEA AND VOMITING THAT IS NOT CONTROLLED WITH YOUR NAUSEA MEDICATION  *UNUSUAL SHORTNESS OF BREATH  *UNUSUAL BRUISING OR BLEEDING  TENDERNESS IN MOUTH AND THROAT WITH OR WITHOUT PRESENCE OF ULCERS  *URINARY PROBLEMS  *BOWEL PROBLEMS  UNUSUAL RASH Items with * indicate a potential emergency and should be followed up as soon as possible.  Feel free to call the clinic you have any questions or concerns. The clinic phone number is [phone].  Please show the [place] at check-in to the Emergency Department and triage nurse.  [facility] Discharge Instructions for Patients Receiving Chemotherapy  Today you received the following chemotherapy agents Paclitaxel.  To help prevent nausea and vomiting after your treatment, we encourage you to take your nausea medication as directed.    If you develop nausea and vomiting that is not controlled by your nausea medication, call the clinic.   BELOW ARE SYMPTOMS THAT SHOULD BE REPORTED IMMEDIATELY:  *FEVER GREATER THAN 100.5 F  *CHILLS WITH OR WITHOUT FEVER  NAUSEA AND VOMITING THAT IS NOT CONTROLLED WITH YOUR NAUSEA MEDICATION  *UNUSUAL SHORTNESS OF BREATH  *UNUSUAL BRUISING OR BLEEDING  TENDERNESS IN MOUTH AND THROAT WITH OR WITHOUT PRESENCE OF ULCERS  *URINARY PROBLEMS  *BOWEL PROBLEMS  UNUSUAL RASH Items with * indicate a potential emergency and should be followed up as soon as  possible.  Feel free to call the clinic you have any questions or concerns. The clinic phone number is [phone].  Please show the [place] at check-in to the Emergency Department and triage nurse.

## 2015-01-31 ENCOUNTER — Other Ambulatory Visit: Payer: Self-pay | Admitting: *Deleted

## 2015-01-31 ENCOUNTER — Telehealth: Payer: Self-pay | Admitting: *Deleted

## 2015-01-31 DIAGNOSIS — C169 Malignant neoplasm of stomach, unspecified: Secondary | ICD-10-CM

## 2015-01-31 LAB — CEA: CEA: 3.7 ng/mL (ref 0.0–5.0)

## 2015-01-31 NOTE — Telephone Encounter (Signed)
-----   Message from Ladell Pier, MD sent at 01/31/2015  3:32 PM EDT ----- Check cea next visit

## 2015-02-09 ENCOUNTER — Other Ambulatory Visit: Payer: Self-pay | Admitting: Oncology

## 2015-02-13 ENCOUNTER — Ambulatory Visit (HOSPITAL_BASED_OUTPATIENT_CLINIC_OR_DEPARTMENT_OTHER): Payer: Self-pay

## 2015-02-13 ENCOUNTER — Encounter: Payer: Self-pay | Admitting: *Deleted

## 2015-02-13 ENCOUNTER — Ambulatory Visit: Payer: Self-pay

## 2015-02-13 ENCOUNTER — Ambulatory Visit (HOSPITAL_BASED_OUTPATIENT_CLINIC_OR_DEPARTMENT_OTHER): Payer: Self-pay | Admitting: Pharmacist

## 2015-02-13 ENCOUNTER — Ambulatory Visit (HOSPITAL_BASED_OUTPATIENT_CLINIC_OR_DEPARTMENT_OTHER): Payer: Self-pay | Admitting: Oncology

## 2015-02-13 ENCOUNTER — Other Ambulatory Visit (HOSPITAL_BASED_OUTPATIENT_CLINIC_OR_DEPARTMENT_OTHER): Payer: Self-pay

## 2015-02-13 ENCOUNTER — Other Ambulatory Visit: Payer: Self-pay | Admitting: *Deleted

## 2015-02-13 VITALS — BP 145/97 | HR 95 | Temp 98.6°F | Resp 18 | Ht 60.0 in | Wt 150.3 lb

## 2015-02-13 VITALS — BP 145/90 | HR 80

## 2015-02-13 DIAGNOSIS — C169 Malignant neoplasm of stomach, unspecified: Secondary | ICD-10-CM

## 2015-02-13 DIAGNOSIS — Z5181 Encounter for therapeutic drug level monitoring: Secondary | ICD-10-CM

## 2015-02-13 DIAGNOSIS — Z95828 Presence of other vascular implants and grafts: Secondary | ICD-10-CM

## 2015-02-13 DIAGNOSIS — Z7901 Long term (current) use of anticoagulants: Secondary | ICD-10-CM

## 2015-02-13 DIAGNOSIS — I2699 Other pulmonary embolism without acute cor pulmonale: Secondary | ICD-10-CM

## 2015-02-13 DIAGNOSIS — Z5112 Encounter for antineoplastic immunotherapy: Secondary | ICD-10-CM

## 2015-02-13 DIAGNOSIS — G620 Drug-induced polyneuropathy: Secondary | ICD-10-CM

## 2015-02-13 LAB — CBC WITH DIFFERENTIAL/PLATELET
BASO%: 0.6 % (ref 0.0–2.0)
BASOS ABS: 0 10*3/uL (ref 0.0–0.1)
EOS ABS: 0.1 10*3/uL (ref 0.0–0.5)
EOS%: 1 % (ref 0.0–7.0)
HEMATOCRIT: 28.1 % — AB (ref 34.8–46.6)
HGB: 8.7 g/dL — ABNORMAL LOW (ref 11.6–15.9)
LYMPH#: 1 10*3/uL (ref 0.9–3.3)
LYMPH%: 18.6 % (ref 14.0–49.7)
MCH: 28.3 pg (ref 25.1–34.0)
MCHC: 31 g/dL — ABNORMAL LOW (ref 31.5–36.0)
MCV: 91.5 fL (ref 79.5–101.0)
MONO#: 0.7 10*3/uL (ref 0.1–0.9)
MONO%: 13.6 % (ref 0.0–14.0)
NEUT%: 66.2 % (ref 38.4–76.8)
NEUTROS ABS: 3.4 10*3/uL (ref 1.5–6.5)
PLATELETS: 385 10*3/uL (ref 145–400)
RBC: 3.07 10*6/uL — ABNORMAL LOW (ref 3.70–5.45)
RDW: 18.9 % — AB (ref 11.2–14.5)
WBC: 5.2 10*3/uL (ref 3.9–10.3)

## 2015-02-13 LAB — POCT INR: INR: 2.8

## 2015-02-13 LAB — COMPREHENSIVE METABOLIC PANEL (CC13)
ALBUMIN: 3 g/dL — AB (ref 3.5–5.0)
ALK PHOS: 96 U/L (ref 40–150)
ALT: <6 U/L (ref 0–55)
ANION GAP: 10 meq/L (ref 3–11)
AST: 11 U/L (ref 5–34)
BUN: 9 mg/dL (ref 7.0–26.0)
CHLORIDE: 103 meq/L (ref 98–109)
CO2: 26 meq/L (ref 22–29)
Calcium: 9.2 mg/dL (ref 8.4–10.4)
Creatinine: 0.7 mg/dL (ref 0.6–1.1)
EGFR: >90 mL/min/{1.73_m2} (ref >90)
Glucose: 100 mg/dl (ref 70–140)
POTASSIUM: 3.2 meq/L — AB (ref 3.5–5.1)
SODIUM: 139 meq/L (ref 136–145)
Total Bilirubin: 0.67 mg/dL (ref 0.20–1.20)
Total Protein: 6.3 g/dL — ABNORMAL LOW (ref 6.4–8.3)

## 2015-02-13 LAB — PROTIME-INR
INR: 2.8 (ref 2.00–3.50)
Protime: 33.6 Seconds — ABNORMAL HIGH (ref 10.6–13.4)

## 2015-02-13 LAB — CEA: CEA: 4.2 ng/mL (ref 0.0–5.0)

## 2015-02-13 MED ORDER — SODIUM CHLORIDE 0.9 % IJ SOLN
10.0000 mL | INTRAMUSCULAR | Status: DC | PRN
  Administered 2015-02-13: 10 mL via INTRAVENOUS
  Filled 2015-02-13: qty 10

## 2015-02-13 MED ORDER — FAMOTIDINE IN NACL 20-0.9 MG/50ML-% IV SOLN
INTRAVENOUS | Status: AC
  Filled 2015-02-13: qty 50

## 2015-02-13 MED ORDER — ACETAMINOPHEN 325 MG PO TABS
650.0000 mg | ORAL_TABLET | Freq: Once | ORAL | Status: AC
  Administered 2015-02-13: 650 mg via ORAL

## 2015-02-13 MED ORDER — SODIUM CHLORIDE 0.9 % IJ SOLN
10.0000 mL | INTRAMUSCULAR | Status: DC | PRN
  Administered 2015-02-13: 10 mL
  Filled 2015-02-13: qty 10

## 2015-02-13 MED ORDER — ACETAMINOPHEN 325 MG PO TABS
ORAL_TABLET | ORAL | Status: AC
  Filled 2015-02-13: qty 2

## 2015-02-13 MED ORDER — DEXAMETHASONE SODIUM PHOSPHATE 100 MG/10ML IJ SOLN
Freq: Once | INTRAMUSCULAR | Status: AC
  Administered 2015-02-13: 11:00:00 via INTRAVENOUS
  Filled 2015-02-13: qty 4

## 2015-02-13 MED ORDER — SODIUM CHLORIDE 0.9 % IV SOLN
8.0000 mg/kg | Freq: Once | INTRAVENOUS | Status: AC
  Administered 2015-02-13: 600 mg via INTRAVENOUS
  Filled 2015-02-13: qty 60

## 2015-02-13 MED ORDER — PACLITAXEL CHEMO INJECTION 300 MG/50ML
80.0000 mg/m2 | Freq: Once | INTRAVENOUS | Status: AC
  Administered 2015-02-13: 144 mg via INTRAVENOUS
  Filled 2015-02-13: qty 24

## 2015-02-13 MED ORDER — DIPHENHYDRAMINE HCL 50 MG/ML IJ SOLN
INTRAMUSCULAR | Status: AC
  Filled 2015-02-13: qty 1

## 2015-02-13 MED ORDER — DIPHENHYDRAMINE HCL 50 MG/ML IJ SOLN
25.0000 mg | Freq: Once | INTRAMUSCULAR | Status: AC
  Administered 2015-02-13: 25 mg via INTRAVENOUS

## 2015-02-13 MED ORDER — HEPARIN SOD (PORK) LOCK FLUSH 100 UNIT/ML IV SOLN
500.0000 [IU] | Freq: Once | INTRAVENOUS | Status: AC | PRN
  Administered 2015-02-13: 500 [IU]
  Filled 2015-02-13: qty 5

## 2015-02-13 MED ORDER — FAMOTIDINE IN NACL 20-0.9 MG/50ML-% IV SOLN
20.0000 mg | Freq: Once | INTRAVENOUS | Status: AC
  Administered 2015-02-13: 20 mg via INTRAVENOUS

## 2015-02-13 MED ORDER — SODIUM CHLORIDE 0.9 % IV SOLN
Freq: Once | INTRAVENOUS | Status: AC
  Administered 2015-02-13: 11:00:00 via INTRAVENOUS

## 2015-02-13 NOTE — Progress Notes (Signed)
Discharged at 53 with son

## 2015-02-13 NOTE — Patient Instructions (Signed)

## 2015-02-13 NOTE — Progress Notes (Signed)
INR = 2.8  Goal 2-3 Patient seen with interpreter today. No new medications. Patient is very concerned today as she cannot pay her bills and is about to be evicted from her apartment. I have called Polo Riley, social worker, and she will see Alicia [name] during her appts here today. I also made Dr. Benay Spice aware of this situation, he sees her later this morning. She states that she still has bleeding gums when she brushes her teeth. Recently, she has also spit out a few small blood clots each morning when she gets up. This information was given to Dr. Benay Spice. Since she is having bleeding, I will lower her dose to attempt to be at the lower end of her goal range. We will decrease Coumadin to 4mg  daily except 2 mg Tuesdays and Fridays. Recheck INR next week with scheduled appointments on 7/7. Lab at 8:30, flush at 8:45, Coumadin clinic at 9:00, Ned Card at 9:15 and infusion at 10:30. We will see her in the Coumadin clinic rather than in infusion because she speaks no [place] and it is easier for the interpreter to interpret in the quieter space of the Coumadin clinic.  Alicia [name], PharmD  Samples given:  Coumadin 2 mg #10   Lot: [id number]   Exp:  09/2015

## 2015-02-13 NOTE — Patient Instructions (Signed)
[facility] Discharge Instructions for Patients Receiving Chemotherapy  Today you received the following chemotherapy agents Taxol, Cyramaza.  To help prevent nausea and vomiting after your treatment, we encourage you to take your nausea medication ativan, zofran, compazine as ordered by Dr. Benay Spice.   If you develop nausea and vomiting that is not controlled by your nausea medication, call the clinic.   BELOW ARE SYMPTOMS THAT SHOULD BE REPORTED IMMEDIATELY:  *FEVER GREATER THAN 100.5 F  *CHILLS WITH OR WITHOUT FEVER  NAUSEA AND VOMITING THAT IS NOT CONTROLLED WITH YOUR NAUSEA MEDICATION  *UNUSUAL SHORTNESS OF BREATH  *UNUSUAL BRUISING OR BLEEDING  TENDERNESS IN MOUTH AND THROAT WITH OR WITHOUT PRESENCE OF ULCERS  *URINARY PROBLEMS  *BOWEL PROBLEMS  UNUSUAL RASH Items with * indicate a potential emergency and should be followed up as soon as possible.  Feel free to call the clinic you have any questions or concerns. The clinic phone number is [phone].  Please show the [place] at check-in to the Emergency Department and triage nurse.

## 2015-02-13 NOTE — Progress Notes (Signed)
[facility] OFFICE PROGRESS NOTE   Diagnosis: Gastric cancer  INTERVAL HISTORY:   She continues systemic therapy. She reports tolerating the therapy well. She has mild neuropathy symptoms. She has noted bleeding with small clots in the mouth when she rinses in the morning. The abdominal pain has improved.  Objective:  Vital signs in last 24 hours:  Blood pressure 145/97, pulse 95, temperature 98.6 F (37 C), temperature source Oral, resp. rate 18, height 5' (1.524 m), weight 150 lb 4.8 oz (68.176 kg), SpO2 100 %.    HEENT: No thrush or ulcers Resp: Lungs clear bilaterally Cardio: Regular in rhythm GI: Soft, masslike fullness surrounding the umbilicus Vascular: No leg edema Neuro: Mild decrease in vibratory sense at the fingertips bilaterally    Portacath/PICC-without erythema  Lab Results:  Lab Results  Component Value Date   WBC 5.2 02/13/2015   HGB 8.7* 02/13/2015   HCT 28.1* 02/13/2015   MCV 91.5 02/13/2015   PLT 385 02/13/2015   NEUTROABS 3.4 02/13/2015   PT/INR 2.8   Lab Results  Component Value Date   CEA 4.2 02/13/2015     Medications: I have reviewed the patient's current medications.  Assessment/Plan: 1. Stage IV gastric cancer  Status post bilateral oophorectomy, total abdominal hysterectomy, omentectomy, resection of abdominal wall and pelvic masses, bilateral pelvic lymphadenectomy 10/18/2013 with the pathology confirming moderate to poorly differentiated adenocarcinoma  Stomach biopsy 11/28/2013 revealed adenocarcinoma  Status post 12 cycles of FOLFOX, last given 04/25/2014  Restaging CT 04/29/2014 with thickening of the stomach and nodular densities adjacent to the greater curvature-unchanged, faint groundglass nodularity in the lungs-nonspecific  Restaging CT 07/22/2014 with indeterminate lung nodules; moderate wall thickening in the body region of the stomach; fairly extensive omental disease and findings suspicious for  abdominal wall and incisional disease.  Started systemic chemotherapy with FOLFIRI on 07/30/14.  Restaging CT evaluation 10/14/2014 following 5 cycles of FOLFIRI showed decreased nodularity in both lungs; overall stable omental nodularity; involution of the previously demonstrated ill-defined soft tissue mass involving the anterior abdominal wall incision.  Cycle 6 FOLFIRI 10/15/2014  Cycle 7 FOLFIRI 10/29/2014  Cycle 8 FOLFIRI 11/12/2014  Cycle 9 FOLFIRI 11/26/2014 (dose reduction of 5-fluorouracil due to mucositis)  CT abdomen/pelvis 12/05/2014 revealed mild progression of peritoneal/omental tumor  Initiation of weekly Taxol (3 weeks on/1 week off) and every 2 week Ramucirumab 12/17/2014  Cycle 2 Taxol/Ramucirumab beginning 01/16/2015  Cycle 3 Taxol/ Ramucirumab beginning 02/13/2015   2. Oxaliplatin neuropathy   3. Bilateral upper and lower lobe pulmonary emboli within segmental branches-now maintained on Coumadin. Followed by Coumadin clinic at the [facility].   4. Neutropenia related to chemotherapy 08/13/2014. Cycle 2 FOLFIRI held. Neulasta added beginning with cycle 2 on 08/20/2014.   5. Nausea following chemotherapy-the anti-emetic regimen was adjusted with cycle 4 FOLFIRI 09/17/2014   6. Diarrhea following FOLFIRI-no improvement with Imodium. Lomotil initiated 10/15/2014.    7. Mouth sores following cycle 8 FOLFIRI. 5 fluorouracil dose reduced.   8. Admission 12/05/2014 with nausea/vomiting and diarrhea-likely secondary to FOLFIRI chemotherapy   9. Abdominal pain secondary to carcinomatosis and gastric tumor   10. 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 Admission 01/09/2015 with abdominal pain and nausea/vomiting-a CT confirmed a distal small bowel obstruction. Resolved with bowel rest.   12. Depression. She is currently on Zoloft 100 mg daily.    Disposition:  Her overall status appears unchanged. The plan  is to continue Taxol/ ramucirumab. We will continue to monitor  the neuropathy closely. The plan is to complete several more months of systemic therapy prior to a restaging evaluation.  Betsy Coder, MD  02/13/2015  4:13 PM

## 2015-02-13 NOTE — Progress Notes (Signed)
[facility] [facility] Work  Clinical Social Work was referred by staff as pt "is about to be homeless because she can't pay her rent". CSW met with pt in the infusion room assisted by interpreter. Pt reports she is having issues paying her bills and rent. CSW reviewed assistance options that could assist and provided handout with contacts. Pt reports she has family at home that can read [place] and attempt to follow up. CSW inquired if pt had applied for the grant in the past, as CSW could not find note in EPIC seeing where she had applied. After further inquiry, pt stated the [facility] had helped with bills in the past. CSW contacted [employer] and [employer] to see if pt has any funds left that she could utilize to pay current bills. CSW awaits contact. Pt agreed to CSW to contact her daughter to review resource assistance options. CSW attempted to phone daughter and her VM is not set up. CSW hopeful pt has some funds left in [place] to assist her.   Clinical Social Work interventions: Resource coordination Pt advocacy  Loren Racer, [place] Worker [place]  [place] Phone: [phone] Fax: [phone]

## 2015-02-14 ENCOUNTER — Encounter: Payer: Self-pay | Admitting: *Deleted

## 2015-02-14 ENCOUNTER — Telehealth: Payer: Self-pay | Admitting: *Deleted

## 2015-02-14 ENCOUNTER — Other Ambulatory Visit: Payer: Self-pay | Admitting: *Deleted

## 2015-02-14 MED ORDER — POTASSIUM CHLORIDE CRYS ER 20 MEQ PO TBCR: 20.0000 meq | EXTENDED_RELEASE_TABLET | Freq: Every day | ORAL | Status: DC

## 2015-02-14 NOTE — Progress Notes (Signed)
  [facility] Work  Clinical Social Work received notification that pt has used all her grant funds. Pt was provided with list of resources in community on 02/13/15 and reports to have family members that can read and speak English to assist in connecting to other assistance options. CSW to attempt to locate other options and follow to further assist.   Loren Racer, [place] Worker [place]  [facility] Phone: [phone] Fax: [phone]

## 2015-02-14 NOTE — Telephone Encounter (Signed)
Per Dr. Benay Spice; notified pt (through pt's daughter who speaks English) that she needs to start taking potassium 1 tablet daily.  Pt's daughter conveyed information and verbalized understanding to pick-up script at [place] and start taking today.

## 2015-02-14 NOTE — Addendum Note (Signed)
Addended by: Domenic Schwab on: 02/14/2015 01:06 PM   Modules accepted: Orders

## 2015-02-14 NOTE — Telephone Encounter (Signed)
-----   Message from Ladell Pier, MD sent at 02/13/2015  9:06 PM EDT ----- Please call patient, start kcl 13meq daily

## 2015-02-20 ENCOUNTER — Other Ambulatory Visit (HOSPITAL_BASED_OUTPATIENT_CLINIC_OR_DEPARTMENT_OTHER): Payer: Self-pay

## 2015-02-20 ENCOUNTER — Ambulatory Visit (HOSPITAL_BASED_OUTPATIENT_CLINIC_OR_DEPARTMENT_OTHER): Payer: Self-pay

## 2015-02-20 ENCOUNTER — Ambulatory Visit: Payer: Self-pay

## 2015-02-20 ENCOUNTER — Other Ambulatory Visit: Payer: Self-pay | Admitting: *Deleted

## 2015-02-20 ENCOUNTER — Telehealth: Payer: Self-pay | Admitting: Oncology

## 2015-02-20 ENCOUNTER — Ambulatory Visit (HOSPITAL_BASED_OUTPATIENT_CLINIC_OR_DEPARTMENT_OTHER): Payer: Self-pay | Admitting: Nurse Practitioner

## 2015-02-20 ENCOUNTER — Encounter: Payer: Self-pay | Admitting: *Deleted

## 2015-02-20 ENCOUNTER — Encounter: Payer: Self-pay | Admitting: Oncology

## 2015-02-20 VITALS — BP 165/93 | HR 100 | Temp 98.6°F | Resp 17 | Ht 60.0 in | Wt 148.8 lb

## 2015-02-20 DIAGNOSIS — C169 Malignant neoplasm of stomach, unspecified: Secondary | ICD-10-CM

## 2015-02-20 DIAGNOSIS — C786 Secondary malignant neoplasm of retroperitoneum and peritoneum: Secondary | ICD-10-CM

## 2015-02-20 DIAGNOSIS — Z86711 Personal history of pulmonary embolism: Secondary | ICD-10-CM

## 2015-02-20 DIAGNOSIS — I2699 Other pulmonary embolism without acute cor pulmonale: Secondary | ICD-10-CM

## 2015-02-20 DIAGNOSIS — Z5111 Encounter for antineoplastic chemotherapy: Secondary | ICD-10-CM

## 2015-02-20 DIAGNOSIS — Z7901 Long term (current) use of anticoagulants: Secondary | ICD-10-CM

## 2015-02-20 DIAGNOSIS — Z95828 Presence of other vascular implants and grafts: Secondary | ICD-10-CM

## 2015-02-20 DIAGNOSIS — IMO0001 Reserved for inherently not codable concepts without codable children: Secondary | ICD-10-CM

## 2015-02-20 DIAGNOSIS — D701 Agranulocytosis secondary to cancer chemotherapy: Secondary | ICD-10-CM

## 2015-02-20 DIAGNOSIS — D6481 Anemia due to antineoplastic chemotherapy: Secondary | ICD-10-CM

## 2015-02-20 DIAGNOSIS — R030 Elevated blood-pressure reading, without diagnosis of hypertension: Principal | ICD-10-CM

## 2015-02-20 LAB — CBC WITH DIFFERENTIAL/PLATELET
BASO%: 1.3 % (ref 0.0–2.0)
Basophils Absolute: 0.1 10*3/uL (ref 0.0–0.1)
EOS%: 0.6 % (ref 0.0–7.0)
Eosinophils Absolute: 0 10*3/uL (ref 0.0–0.5)
HCT: 27.5 % — ABNORMAL LOW (ref 34.8–46.6)
HEMOGLOBIN: 8.8 g/dL — AB (ref 11.6–15.9)
LYMPH#: 1 10*3/uL (ref 0.9–3.3)
LYMPH%: 13.8 % — ABNORMAL LOW (ref 14.0–49.7)
MCH: 28.8 pg (ref 25.1–34.0)
MCHC: 32.1 g/dL (ref 31.5–36.0)
MCV: 89.9 fL (ref 79.5–101.0)
MONO#: 0.5 10*3/uL (ref 0.1–0.9)
MONO%: 7.4 % (ref 0.0–14.0)
NEUT#: 5.4 10*3/uL (ref 1.5–6.5)
NEUT%: 76.9 % — ABNORMAL HIGH (ref 38.4–76.8)
Platelets: 321 10*3/uL (ref 145–400)
RBC: 3.06 10*6/uL — ABNORMAL LOW (ref 3.70–5.45)
RDW: 20.5 % — AB (ref 11.2–14.5)
WBC: 7.1 10*3/uL (ref 3.9–10.3)

## 2015-02-20 LAB — PROTIME-INR
INR: 2.8 (ref 2.00–3.50)
Protime: 33.6 Seconds — ABNORMAL HIGH (ref 10.6–13.4)

## 2015-02-20 LAB — POCT INR: INR: 2.8

## 2015-02-20 MED ORDER — SODIUM CHLORIDE 0.9 % IJ SOLN
10.0000 mL | INTRAMUSCULAR | Status: DC | PRN
  Administered 2015-02-20: 10 mL via INTRAVENOUS
  Filled 2015-02-20: qty 10

## 2015-02-20 MED ORDER — FAMOTIDINE IN NACL 20-0.9 MG/50ML-% IV SOLN
INTRAVENOUS | Status: AC
  Filled 2015-02-20: qty 50

## 2015-02-20 MED ORDER — PACLITAXEL CHEMO INJECTION 300 MG/50ML
80.0000 mg/m2 | Freq: Once | INTRAVENOUS | Status: AC
  Administered 2015-02-20: 144 mg via INTRAVENOUS
  Filled 2015-02-20: qty 24

## 2015-02-20 MED ORDER — SODIUM CHLORIDE 0.9 % IV SOLN
250.0000 mL | Freq: Once | INTRAVENOUS | Status: AC
  Administered 2015-02-20: 250 mL via INTRAVENOUS

## 2015-02-20 MED ORDER — AMLODIPINE BESYLATE 5 MG PO TABS: 5.0000 mg | ORAL_TABLET | Freq: Every day | ORAL | Status: DC

## 2015-02-20 MED ORDER — SODIUM CHLORIDE 0.9 % IJ SOLN
10.0000 mL | INTRAMUSCULAR | Status: DC | PRN
Start: 2015-02-20 — End: 2015-02-20
  Administered 2015-02-20: 10 mL
  Filled 2015-02-20: qty 10

## 2015-02-20 MED ORDER — HYDROMORPHONE HCL 4 MG PO TABS: 4.0000 mg | ORAL_TABLET | ORAL | Status: DC | PRN

## 2015-02-20 MED ORDER — HEPARIN SOD (PORK) LOCK FLUSH 100 UNIT/ML IV SOLN
500.0000 [IU] | Freq: Once | INTRAVENOUS | Status: AC | PRN
  Administered 2015-02-20: 500 [IU]
  Filled 2015-02-20: qty 5

## 2015-02-20 MED ORDER — FAMOTIDINE IN NACL 20-0.9 MG/50ML-% IV SOLN
20.0000 mg | Freq: Once | INTRAVENOUS | Status: AC
  Administered 2015-02-20: 20 mg via INTRAVENOUS

## 2015-02-20 MED ORDER — DEXAMETHASONE SODIUM PHOSPHATE 100 MG/10ML IJ SOLN
Freq: Once | INTRAMUSCULAR | Status: AC
  Administered 2015-02-20: 11:00:00 via INTRAVENOUS
  Filled 2015-02-20: qty 4

## 2015-02-20 MED ORDER — DIPHENHYDRAMINE HCL 50 MG/ML IJ SOLN
25.0000 mg | Freq: Once | INTRAMUSCULAR | Status: AC
  Administered 2015-02-20: 25 mg via INTRAVENOUS

## 2015-02-20 MED ORDER — DIPHENHYDRAMINE HCL 50 MG/ML IJ SOLN
INTRAMUSCULAR | Status: AC
  Filled 2015-02-20: qty 1

## 2015-02-20 NOTE — Progress Notes (Signed)
[place] Work  Clinical Social Work was referred by nurse for assessment of psychosocial needs due to ongoing financial concerns.  Clinical Social Worker met with patient at [facility] during infusion to offer support and assess for needs.  Pt assisted by interpreter today. Pt reports her family called resource list for assistance and resources were out of funds currently. Pt stated that CSW had not called her daughter. CSW educated pt that [place] had called several times, but daughter does not have voice mail set up and CSW cannot leave a message.   CSW asked to assist with medications for pt today. Pt has used up all of her grant funds currently. CSW called [facility] outpt pharmacy to inquire about cost of RX through them and to explore use of pt's medicaid, as card listed in documents. CSW spoke with Aaron Edelman, he reports pt's medicaid is currently inactive. CSW contacted both Lenise and Raquel about issue with MCD. Per [address], MCD has paid in the past and she is contacting caseworker to try to see how to get MCD restarted. Once pt has MCD, her Rx will be $3 each for generics. CSW provided handout about upcoming free other the counter medication give away. This was also in [place]. CSW provided card to pt and stressed to interpreter to have daughter contact CSW. Pt also aware of issue with medicaid and need to possibly provide them with additional information. CSW continues to look for resources to assist family, but has exhausted all current options.   Clinical Social Work interventions: Set designer assistance Loren Racer, [place] Social Worker [place]  [place] Phone: [phone] Fax: [phone]

## 2015-02-20 NOTE — Progress Notes (Signed)
*  Seen in infusion* No charge for encounter*   Alicia [name] INR is 2.8 today which is within her goal range of 2-3. She was seen during infusion with the assistance of an interpreter. [name] reports no missed and/or extra doses, no changes in medications or diet. She states she is still having some gum bleeding when she brushes her teeth but it is getting better. No other bleeding and/or unusual bruising reported. She has no issues with her anticoagulation at this time.  Plan: Continue current Coumadin dose: 4 mg daily except 2 mg on Tuesdays and Fridays. Return for INR check 7/28 with other appointments:  Lab at 9:45am, flush at 10, Ned Card at 10:15, and infusion at 11. We will see her in infusion

## 2015-02-20 NOTE — Patient Instructions (Signed)

## 2015-02-20 NOTE — Progress Notes (Signed)
Alicia [name] OFFICE PROGRESS NOTE   Diagnosis:  Gastric cancer  INTERVAL HISTORY:   Alicia [name] returns as scheduled. Alicia [name] continues Taxol and cyramza. Alicia [name] has intermittent nausea with occasional vomiting. No significant diarrhea. No mouth sores. Alicia [name] has mild numbness in the hands and feet which overall is better. Abdominal pain is controlled with pain medication. At present Alicia [name] does not have pain medication because Alicia [name] cannot afford to pay for it. Appetite is poor.  Objective:  Vital signs in last 24 hours:  Blood pressure 165/93, pulse 100, temperature 98.6 F (37 C), temperature source Oral, resp. rate 17, height 5' (1.524 m), weight 148 lb 12.8 oz (67.495 kg), SpO2 99 %.    HEENT: No thrush or ulcers. Resp: Lungs clear bilaterally. Cardio: Regular rate and rhythm. GI: Abdomen is soft. No hepatomegaly. Masslike fullness surrounding the umbilicus. Vascular: No leg edema. Calves soft and nontender. Neuro: Vibratory sense mildly decreased over the fingertips per tuning fork exam.  Skin: No rash. Port-A-Cath without erythema.    Lab Results:  Lab Results  Component Value Date   WBC 7.1 02/20/2015   HGB 8.8* 02/20/2015   HCT 27.5* 02/20/2015   MCV 89.9 02/20/2015   PLT 321 02/20/2015   NEUTROABS 5.4 02/20/2015    Imaging:  No results found.  Medications: I have reviewed the patient's current medications.  Assessment/Plan: 1. Stage IV gastric cancer  Status post bilateral oophorectomy, total abdominal hysterectomy, omentectomy, resection of abdominal wall and pelvic masses, bilateral pelvic lymphadenectomy 10/18/2013 with the pathology confirming moderate to poorly differentiated adenocarcinoma  Stomach biopsy 11/28/2013 revealed adenocarcinoma  Status post 12 cycles of FOLFOX, last given 04/25/2014  Restaging CT 04/29/2014 with thickening of the stomach and nodular densities adjacent to the greater curvature-unchanged, faint groundglass nodularity  in the lungs-nonspecific  Restaging CT 07/22/2014 with indeterminate lung nodules; moderate wall thickening in the body region of the stomach; fairly extensive omental disease and findings suspicious for abdominal wall and incisional disease.  Started systemic chemotherapy with FOLFIRI on 07/30/14.  Restaging CT evaluation 10/14/2014 following 5 cycles of FOLFIRI showed decreased nodularity in both lungs; overall stable omental nodularity; involution of the previously demonstrated ill-defined soft tissue mass involving the anterior abdominal wall incision.  Cycle 6 FOLFIRI 10/15/2014  Cycle 7 FOLFIRI 10/29/2014  Cycle 8 FOLFIRI 11/12/2014  Cycle 9 FOLFIRI 11/26/2014 (dose reduction of 5-fluorouracil due to mucositis)  CT abdomen/pelvis 12/05/2014 revealed mild progression of peritoneal/omental tumor  Initiation of weekly Taxol (3 weeks on/1 week off) and every 2 week Ramucirumab 12/17/2014  Cycle 2 Taxol/Ramucirumab beginning 01/16/2015  Cycle 3 Taxol/ Ramucirumab beginning 02/13/2015   2. Oxaliplatin neuropathy   3. Bilateral upper and lower lobe pulmonary emboli within segmental branches-now maintained on Coumadin. Followed by Coumadin clinic at the [facility].   4. Neutropenia related to chemotherapy 08/13/2014. Cycle 2 FOLFIRI held. Neulasta added beginning with cycle 2 on 08/20/2014.   5. Nausea following chemotherapy-the anti-emetic regimen was adjusted with cycle 4 FOLFIRI 09/17/2014   6. Diarrhea following FOLFIRI-no improvement with Imodium. Lomotil initiated 10/15/2014.    7. Mouth sores following cycle 8 FOLFIRI. 5 fluorouracil dose reduced.   8. Admission 12/05/2014 with nausea/vomiting and diarrhea-likely secondary to FOLFIRI chemotherapy   9. Abdominal pain secondary to carcinomatosis and gastric tumor   10. 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 Admission 01/09/2015 with abdominal pain and nausea/vomiting-a  CT confirmed a distal small bowel obstruction. Resolved with bowel rest.   12. Depression.  Alicia [name] is currently on Zoloft 100 mg daily.     Disposition: Alicia [name] appears stable. Alicia [name] is completing cycle 3 Taxol/cyramza. Plan to proceed with day 8 Taxol today as scheduled. Alicia [name] will return for a follow-up visit and day 15 Taxol/cyramza in one week.  Alicia [name] is unable to obtain medications due to financial constraints. We will ask the [place] worker to meet with her today.  Blood pressure is elevated. This may be related to the cyramza. We are prescribing Norvasc 5 mg daily. We will contact her pharmacy to see if they can provide this for her at no cost.  Plan reviewed with Dr. Benay Spice.  Ned Card ANP/GNP-BC   02/20/2015  9:50 AM

## 2015-02-20 NOTE — Telephone Encounter (Signed)
Gave and printed appt sched and avs for pt for July and Aug °

## 2015-02-20 NOTE — Patient Instructions (Signed)
[facility] Discharge Instructions for Patients Receiving Chemotherapy  Today you received the following chemotherapy agents:   To help prevent nausea and vomiting after your treatment, we encourage you to take your nausea medication.  Take it as often as prescribed.     If you develop nausea and vomiting that is not controlled by your nausea medication, call the clinic. If it is after clinic hours your family physician or the after hours number for the clinic or go to the Emergency Department.   BELOW ARE SYMPTOMS THAT SHOULD BE REPORTED IMMEDIATELY:  *FEVER GREATER THAN 100.5 F  *CHILLS WITH OR WITHOUT FEVER  NAUSEA AND VOMITING THAT IS NOT CONTROLLED WITH YOUR NAUSEA MEDICATION  *UNUSUAL SHORTNESS OF BREATH  *UNUSUAL BRUISING OR BLEEDING  TENDERNESS IN MOUTH AND THROAT WITH OR WITHOUT PRESENCE OF ULCERS  *URINARY PROBLEMS  *BOWEL PROBLEMS  UNUSUAL RASH Items with * indicate a potential emergency and should be followed up as soon as possible.  Feel free to call the clinic you have any questions or concerns. The clinic phone number is [phone].   I have been informed and understand all the instructions given to me. I know to contact the clinic, my physician, or go to the Emergency Department if any problems should occur. I do not have any questions at this time, but understand that I may call the clinic during office hours   should I have any questions or need assistance in obtaining follow up care.    __________________________________________  _____________  __________ Signature of Patient or Authorized Representative            Date                   Time    __________________________________________ Nurse's Signature    

## 2015-02-20 NOTE — Progress Notes (Signed)
Left msg for Archie Patten (Medicaid case worker) at [phone] to find out what pt can do to get her Medicaid active again.

## 2015-02-21 ENCOUNTER — Encounter: Payer: Self-pay | Admitting: Oncology

## 2015-02-21 NOTE — Progress Notes (Signed)
Pt's case worker, Archie Patten returned my call and stated that pt's Medicaid application is in a pending status.

## 2015-02-22 ENCOUNTER — Other Ambulatory Visit: Payer: Self-pay | Admitting: Oncology

## 2015-02-24 ENCOUNTER — Other Ambulatory Visit: Payer: Self-pay | Admitting: Nurse Practitioner

## 2015-02-25 DIAGNOSIS — R319 Hematuria, unspecified: Secondary | ICD-10-CM | POA: Insufficient documentation

## 2015-02-27 ENCOUNTER — Ambulatory Visit (HOSPITAL_BASED_OUTPATIENT_CLINIC_OR_DEPARTMENT_OTHER): Payer: Self-pay | Admitting: Nurse Practitioner

## 2015-02-27 ENCOUNTER — Ambulatory Visit: Payer: Self-pay

## 2015-02-27 ENCOUNTER — Other Ambulatory Visit (HOSPITAL_BASED_OUTPATIENT_CLINIC_OR_DEPARTMENT_OTHER): Payer: Self-pay

## 2015-02-27 ENCOUNTER — Encounter: Payer: Self-pay | Admitting: *Deleted

## 2015-02-27 ENCOUNTER — Ambulatory Visit: Payer: Self-pay | Admitting: Nutrition

## 2015-02-27 ENCOUNTER — Ambulatory Visit (HOSPITAL_BASED_OUTPATIENT_CLINIC_OR_DEPARTMENT_OTHER): Payer: Self-pay

## 2015-02-27 VITALS — BP 139/89 | HR 77

## 2015-02-27 VITALS — BP 149/100 | HR 100 | Temp 98.6°F | Resp 18 | Ht 60.0 in | Wt 145.8 lb

## 2015-02-27 DIAGNOSIS — D6481 Anemia due to antineoplastic chemotherapy: Secondary | ICD-10-CM

## 2015-02-27 DIAGNOSIS — C169 Malignant neoplasm of stomach, unspecified: Secondary | ICD-10-CM

## 2015-02-27 DIAGNOSIS — D638 Anemia in other chronic diseases classified elsewhere: Secondary | ICD-10-CM

## 2015-02-27 DIAGNOSIS — Z5111 Encounter for antineoplastic chemotherapy: Secondary | ICD-10-CM

## 2015-02-27 DIAGNOSIS — Z7901 Long term (current) use of anticoagulants: Secondary | ICD-10-CM

## 2015-02-27 DIAGNOSIS — I2699 Other pulmonary embolism without acute cor pulmonale: Secondary | ICD-10-CM

## 2015-02-27 DIAGNOSIS — Z5112 Encounter for antineoplastic immunotherapy: Secondary | ICD-10-CM

## 2015-02-27 DIAGNOSIS — C7951 Secondary malignant neoplasm of bone: Secondary | ICD-10-CM

## 2015-02-27 DIAGNOSIS — Z95828 Presence of other vascular implants and grafts: Secondary | ICD-10-CM

## 2015-02-27 LAB — COMPREHENSIVE METABOLIC PANEL (CC13)
ALBUMIN: 3.1 g/dL — AB (ref 3.5–5.0)
ALT: <6 U/L (ref 0–55)
AST: 11 U/L (ref 5–34)
Alkaline Phosphatase: 98 U/L (ref 40–150)
Anion Gap: 8 mEq/L (ref 3–11)
BUN: 6.5 mg/dL — ABNORMAL LOW (ref 7.0–26.0)
CALCIUM: 9.1 mg/dL (ref 8.4–10.4)
CHLORIDE: 103 meq/L (ref 98–109)
CO2: 28 mEq/L (ref 22–29)
Creatinine: 0.7 mg/dL (ref 0.6–1.1)
EGFR: >90 mL/min/{1.73_m2} (ref >90)
Glucose: 121 mg/dl (ref 70–140)
Potassium: 3.1 mEq/L — ABNORMAL LOW (ref 3.5–5.1)
Sodium: 138 mEq/L (ref 136–145)
Total Bilirubin: 0.6 mg/dL (ref 0.20–1.20)
Total Protein: 6.6 g/dL (ref 6.4–8.3)

## 2015-02-27 LAB — CBC WITH DIFFERENTIAL/PLATELET
BASO%: 1.2 % (ref 0.0–2.0)
Basophils Absolute: 0.1 10*3/uL (ref 0.0–0.1)
EOS ABS: 0.1 10*3/uL (ref 0.0–0.5)
EOS%: 1.1 % (ref 0.0–7.0)
HEMATOCRIT: 28.5 % — AB (ref 34.8–46.6)
HGB: 9.1 g/dL — ABNORMAL LOW (ref 11.6–15.9)
LYMPH#: 1.3 10*3/uL (ref 0.9–3.3)
LYMPH%: 24.7 % (ref 14.0–49.7)
MCH: 29 pg (ref 25.1–34.0)
MCHC: 32 g/dL (ref 31.5–36.0)
MCV: 90.5 fL (ref 79.5–101.0)
MONO#: 0.4 10*3/uL (ref 0.1–0.9)
MONO%: 8.5 % (ref 0.0–14.0)
NEUT%: 64.5 % (ref 38.4–76.8)
NEUTROS ABS: 3.3 10*3/uL (ref 1.5–6.5)
PLATELETS: 423 10*3/uL — AB (ref 145–400)
RBC: 3.15 10*6/uL — ABNORMAL LOW (ref 3.70–5.45)
RDW: 21.4 % — AB (ref 11.2–14.5)
WBC: 5.2 10*3/uL (ref 3.9–10.3)

## 2015-02-27 LAB — PROTIME-INR
INR: 2.6 (ref 2.00–3.50)
Protime: 31.2 Seconds — ABNORMAL HIGH (ref 10.6–13.4)

## 2015-02-27 LAB — UA PROTEIN, DIPSTICK - CHCC: PROTEIN: 30 mg/dL

## 2015-02-27 MED ORDER — SODIUM CHLORIDE 0.9 % IV SOLN
Freq: Once | INTRAVENOUS | Status: AC
  Administered 2015-02-27: 12:00:00 via INTRAVENOUS

## 2015-02-27 MED ORDER — DIPHENHYDRAMINE HCL 50 MG/ML IJ SOLN
INTRAMUSCULAR | Status: AC
  Filled 2015-02-27: qty 1

## 2015-02-27 MED ORDER — SODIUM CHLORIDE 0.9 % IJ SOLN
10.0000 mL | INTRAMUSCULAR | Status: DC | PRN
  Administered 2015-02-27: 10 mL
  Filled 2015-02-27: qty 10

## 2015-02-27 MED ORDER — ACETAMINOPHEN 325 MG PO TABS
650.0000 mg | ORAL_TABLET | Freq: Once | ORAL | Status: AC
  Administered 2015-02-27: 650 mg via ORAL

## 2015-02-27 MED ORDER — HEPARIN SOD (PORK) LOCK FLUSH 100 UNIT/ML IV SOLN
500.0000 [IU] | Freq: Once | INTRAVENOUS | Status: AC | PRN
  Administered 2015-02-27: 500 [IU]
  Filled 2015-02-27: qty 5

## 2015-02-27 MED ORDER — DIPHENHYDRAMINE HCL 50 MG/ML IJ SOLN
25.0000 mg | Freq: Once | INTRAMUSCULAR | Status: AC
  Administered 2015-02-27: 25 mg via INTRAVENOUS

## 2015-02-27 MED ORDER — FAMOTIDINE IN NACL 20-0.9 MG/50ML-% IV SOLN
20.0000 mg | Freq: Once | INTRAVENOUS | Status: AC
  Administered 2015-02-27: 20 mg via INTRAVENOUS

## 2015-02-27 MED ORDER — SODIUM CHLORIDE 0.9 % IV SOLN: 8.0000 mg/kg | Freq: Once | INTRAVENOUS | Status: DC

## 2015-02-27 MED ORDER — SODIUM CHLORIDE 0.9 % IJ SOLN
10.0000 mL | INTRAMUSCULAR | Status: DC | PRN
  Administered 2015-02-27: 10 mL via INTRAVENOUS
  Filled 2015-02-27: qty 10

## 2015-02-27 MED ORDER — PACLITAXEL CHEMO INJECTION 300 MG/50ML
80.0000 mg/m2 | Freq: Once | INTRAVENOUS | Status: AC
  Administered 2015-02-27: 144 mg via INTRAVENOUS
  Filled 2015-02-27: qty 24

## 2015-02-27 MED ORDER — FAMOTIDINE IN NACL 20-0.9 MG/50ML-% IV SOLN
INTRAVENOUS | Status: AC
  Filled 2015-02-27: qty 50

## 2015-02-27 MED ORDER — SODIUM CHLORIDE 0.9 % IV SOLN
8.0000 mg/kg | Freq: Once | INTRAVENOUS | Status: AC
  Administered 2015-02-27: 500 mg via INTRAVENOUS
  Filled 2015-02-27: qty 50

## 2015-02-27 MED ORDER — SODIUM CHLORIDE 0.9 % IV SOLN
Freq: Once | INTRAVENOUS | Status: AC
  Administered 2015-02-27: 12:00:00 via INTRAVENOUS
  Filled 2015-02-27: qty 4

## 2015-02-27 MED ORDER — ACETAMINOPHEN 325 MG PO TABS
ORAL_TABLET | ORAL | Status: AC
  Filled 2015-02-27: qty 2

## 2015-02-27 NOTE — Progress Notes (Signed)
Pt reports that she has not taken her b/p Medicine today due to financial issues. She reports via translator that she made the doctor aware and that he told her to pick up her medicine today. Pt educated on importance of taking medication as prescribed. Pt verbalizes understanding per translator. VS stable at this time.

## 2015-02-27 NOTE — Progress Notes (Signed)
Patient requires oral nutrition supplement samples. Patient has had progressive weight loss with last weight documented as 145.8 pounds on July 14.   This is decreased from 196 pounds October 2015. Provided patient with samples of strawberry ensure Enlive and Carnation breakfast essentials per her request.

## 2015-02-27 NOTE — Progress Notes (Signed)
Urine protein specimen collected at 12:10 and walked to lab, order placed per pharmacy request.

## 2015-02-27 NOTE — Patient Instructions (Signed)
[place] Discharge Instructions for Patients Receiving Chemotherapy  Today you received the following chemotherapy agents cyramza/taxol  To help prevent nausea and vomiting after your treatment, we encourage you to take your nausea medication as directed   If you develop nausea and vomiting that is not controlled by your nausea medication, call the clinic.   BELOW ARE SYMPTOMS THAT SHOULD BE REPORTED IMMEDIATELY:  *FEVER GREATER THAN 100.5 F  *CHILLS WITH OR WITHOUT FEVER  NAUSEA AND VOMITING THAT IS NOT CONTROLLED WITH YOUR NAUSEA MEDICATION  *UNUSUAL SHORTNESS OF BREATH  *UNUSUAL BRUISING OR BLEEDING  TENDERNESS IN MOUTH AND THROAT WITH OR WITHOUT PRESENCE OF ULCERS  *URINARY PROBLEMS  *BOWEL PROBLEMS  UNUSUAL RASH Items with * indicate a potential emergency and should be followed up as soon as possible.  Feel free to call the clinic you have any questions or concerns. The clinic phone number is [phone].

## 2015-02-27 NOTE — Progress Notes (Signed)
Oncology Nurse Navigator Documentation  Oncology Nurse Navigator Flowsheets 02/27/2015  Navigator Encounter Type 1 month F/U  Patient Visit Type -Med Onc/chemo  Treatment Phase Treatment Cycle 3, day 15  Barriers/Navigation Needs Financial;Family concerns--not eating well  Time Spent with Patient (Retired) -  Met with patient during visit today and via interpreter found that she did not fill her BP or Lomotil last week due to finances. Says her husband gave her some money to fill these today if they don't cost too much. Income is social security for her and her husband and daughter. Has not heard from Medicaid yet. Still a month behind in rent, but they are allowing them to stay as long as they can make small partial payments. She is aware of the OTC giveaway on July 22nd. Spoke with registered dietician and she provided some Ensure/Boost.

## 2015-02-27 NOTE — Progress Notes (Signed)
[place] OFFICE PROGRESS NOTE   Diagnosis:  Gastric cancer  INTERVAL HISTORY:   Ms. Foisy returns as scheduled. She continues Taxol and cyramza. She had intermittent nausea/vomiting for 3 days following the last treatment. She also had diarrhea during this time. All symptoms are better now. She has mild stable numbness in the hands and feet. Abdominal pain is controlled with pain medication. Appetite continues to be poor. She has been unable to obtain her medications due to financial constraints. She plans on picking up her pain medication, diarrhea medication and new blood pressure medication today.  Objective:  Vital signs in last 24 hours:  Blood pressure 149/100, pulse 100, temperature 98.6 F (37 C), temperature source Oral, resp. rate 18, height 5' (1.524 m), weight 145 lb 12.8 oz (66.134 kg), SpO2 100 %.    HEENT: No thrush or ulcers. Resp: Lungs clear bilaterally. Cardio: Regular rate and rhythm. GI: Abdomen soft and nontender. No hepatomegaly. Persistent masslike fullness surrounding the umbilicus. Vascular: No leg edema. Neuro: Vibratory sense intact over the fingertips per tuning fork exam.  Skin: No rash. Musculoskeletal: Right clavicular head appears slightly more prominent than the left.  Port-A-Cath without erythema.   Lab Results:  Lab Results  Component Value Date   WBC 5.2 02/27/2015   HGB 9.1* 02/27/2015   HCT 28.5* 02/27/2015   MCV 90.5 02/27/2015   PLT 423* 02/27/2015   NEUTROABS 3.3 02/27/2015    Imaging:  No results found.  Medications: I have reviewed the patient's current medications.  Assessment/Plan: 1. Stage IV gastric cancer  Status post bilateral oophorectomy, total abdominal hysterectomy, omentectomy, resection of abdominal wall and pelvic masses, bilateral pelvic lymphadenectomy 10/18/2013 with the pathology confirming moderate to poorly differentiated adenocarcinoma  Stomach biopsy 11/28/2013 revealed  adenocarcinoma  Status post 12 cycles of FOLFOX, last given 04/25/2014  Restaging CT 04/29/2014 with thickening of the stomach and nodular densities adjacent to the greater curvature-unchanged, faint groundglass nodularity in the lungs-nonspecific  Restaging CT 07/22/2014 with indeterminate lung nodules; moderate wall thickening in the body region of the stomach; fairly extensive omental disease and findings suspicious for abdominal wall and incisional disease.  Started systemic chemotherapy with FOLFIRI on 07/30/14.  Restaging CT evaluation 10/14/2014 following 5 cycles of FOLFIRI showed decreased nodularity in both lungs; overall stable omental nodularity; involution of the previously demonstrated ill-defined soft tissue mass involving the anterior abdominal wall incision.  Cycle 6 FOLFIRI 10/15/2014  Cycle 7 FOLFIRI 10/29/2014  Cycle 8 FOLFIRI 11/12/2014  Cycle 9 FOLFIRI 11/26/2014 (dose reduction of 5-fluorouracil due to mucositis)  CT abdomen/pelvis 12/05/2014 revealed mild progression of peritoneal/omental tumor  Initiation of weekly Taxol (3 weeks on/1 week off) and every 2 week Ramucirumab 12/17/2014  CT abdomen/pelvis 01/08/2015 with findings worrisome for distal small bowel obstruction presumed secondary to adhesions. Apparent right-sided ureteral obstruction possibly secondary to developing adhesions; interval development of a small amount of intra-abdominal ascites. Grossly unchanged peritoneal carcinomatosis. Unchanged 1.4 cm sclerotic osseous metastasis within the right ilium.  Cycle 2 Taxol/Ramucirumab beginning 01/16/2015  Cycle 3 Taxol/ Ramucirumab beginning 02/13/2015   2. Oxaliplatin neuropathy   3. Bilateral upper and lower lobe pulmonary emboli within segmental branches-now maintained on Coumadin. Followed by Coumad[facility] clinic at the [facility].   4. Neutropenia related to chemotherapy 08/13/2014. Cycle 2 FOLFIRI held. Neulasta added beginning with  cycle 2 on 08/20/2014.   5. Nausea following chemotherapy-the anti-emetic regimen was adjusted with cycle 4 FOLFIRI 09/17/2014   6. Diarrhea following FOLFIRI-no improvement with  Imodium. Lomotil initiated 10/15/2014.    7. Mouth sores following cycle 8 FOLFIRI. 5 fluorouracil dose reduced.   8. Admission 12/05/2014 with nausea/vomiting and diarrhea-likely secondary to FOLFIRI chemotherapy   9. Abdominal pain secondary to carcinomatosis and gastric tumor   10. 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 Admission 01/09/2015 with abdominal pain and nausea/vomiting-a CT confirmed a distal small bowel obstruction. Resolved with bowel rest.   12. Depression. She is currently on Zoloft 100 mg daily.     Disposition: Ms. Rauen appears stable. Plan to proceed with cycle 3 day 15 Taxol/cyramza today as scheduled. She will return for a follow-up visit and cycle 4 on 03/13/2015. She will contact the office in the interim with any problems.  The plan is to obtain restaging CT scans following completion of cycle 4.  The potassium level is low. She is not taking potassium. We will have her resume Kdur 20 mEq daily.  Plan reviewed with Dr. Benay Spice.  Ned Card ANP/GNP-BC   02/27/2015  11:04 AM

## 2015-02-27 NOTE — Patient Instructions (Signed)

## 2015-02-28 LAB — CEA: CEA: 4.6 ng/mL (ref 0.0–5.0)

## 2015-03-08 ENCOUNTER — Other Ambulatory Visit: Payer: Self-pay | Admitting: Oncology

## 2015-03-13 ENCOUNTER — Other Ambulatory Visit (HOSPITAL_BASED_OUTPATIENT_CLINIC_OR_DEPARTMENT_OTHER): Payer: Self-pay

## 2015-03-13 ENCOUNTER — Ambulatory Visit: Payer: Self-pay

## 2015-03-13 ENCOUNTER — Ambulatory Visit (HOSPITAL_BASED_OUTPATIENT_CLINIC_OR_DEPARTMENT_OTHER): Payer: Self-pay | Admitting: Pharmacist

## 2015-03-13 ENCOUNTER — Telehealth: Payer: Self-pay | Admitting: Oncology

## 2015-03-13 ENCOUNTER — Ambulatory Visit (HOSPITAL_BASED_OUTPATIENT_CLINIC_OR_DEPARTMENT_OTHER): Payer: Self-pay | Admitting: Nurse Practitioner

## 2015-03-13 ENCOUNTER — Ambulatory Visit (HOSPITAL_BASED_OUTPATIENT_CLINIC_OR_DEPARTMENT_OTHER): Payer: Self-pay

## 2015-03-13 VITALS — BP 120/77 | HR 88

## 2015-03-13 VITALS — BP 148/103 | HR 106 | Temp 98.6°F | Resp 17 | Ht 60.0 in | Wt 142.4 lb

## 2015-03-13 DIAGNOSIS — R030 Elevated blood-pressure reading, without diagnosis of hypertension: Secondary | ICD-10-CM

## 2015-03-13 DIAGNOSIS — Z7901 Long term (current) use of anticoagulants: Secondary | ICD-10-CM

## 2015-03-13 DIAGNOSIS — I2699 Other pulmonary embolism without acute cor pulmonale: Secondary | ICD-10-CM

## 2015-03-13 DIAGNOSIS — G620 Drug-induced polyneuropathy: Secondary | ICD-10-CM

## 2015-03-13 DIAGNOSIS — C7989 Secondary malignant neoplasm of other specified sites: Secondary | ICD-10-CM

## 2015-03-13 DIAGNOSIS — D6481 Anemia due to antineoplastic chemotherapy: Secondary | ICD-10-CM

## 2015-03-13 DIAGNOSIS — Z95828 Presence of other vascular implants and grafts: Secondary | ICD-10-CM

## 2015-03-13 DIAGNOSIS — Z5111 Encounter for antineoplastic chemotherapy: Secondary | ICD-10-CM

## 2015-03-13 DIAGNOSIS — Z5112 Encounter for antineoplastic immunotherapy: Secondary | ICD-10-CM

## 2015-03-13 DIAGNOSIS — C169 Malignant neoplasm of stomach, unspecified: Secondary | ICD-10-CM

## 2015-03-13 LAB — CBC WITH DIFFERENTIAL/PLATELET
BASO%: 0.6 % (ref 0.0–2.0)
Basophils Absolute: 0 10*3/uL (ref 0.0–0.1)
EOS%: 1.1 % (ref 0.0–7.0)
Eosinophils Absolute: 0.1 10*3/uL (ref 0.0–0.5)
HEMATOCRIT: 27.8 % — AB (ref 34.8–46.6)
HEMOGLOBIN: 8.7 g/dL — AB (ref 11.6–15.9)
LYMPH#: 1.3 10*3/uL (ref 0.9–3.3)
LYMPH%: 18.5 % (ref 14.0–49.7)
MCH: 28.2 pg (ref 25.1–34.0)
MCHC: 31.3 g/dL — ABNORMAL LOW (ref 31.5–36.0)
MCV: 90.3 fL (ref 79.5–101.0)
MONO#: 0.8 10*3/uL (ref 0.1–0.9)
MONO%: 11.4 % (ref 0.0–14.0)
NEUT#: 4.8 10*3/uL (ref 1.5–6.5)
NEUT%: 68.4 % (ref 38.4–76.8)
Platelets: 431 10*3/uL — ABNORMAL HIGH (ref 145–400)
RBC: 3.08 10*6/uL — ABNORMAL LOW (ref 3.70–5.45)
RDW: 20.4 % — ABNORMAL HIGH (ref 11.2–14.5)
WBC: 7 10*3/uL (ref 3.9–10.3)

## 2015-03-13 LAB — POCT INR: INR: 2.3

## 2015-03-13 LAB — COMPREHENSIVE METABOLIC PANEL (CC13)
ALK PHOS: 107 U/L (ref 40–150)
ALT: 6 U/L (ref 0–55)
ANION GAP: 8 meq/L (ref 3–11)
AST: 15 U/L (ref 5–34)
Albumin: 3.1 g/dL — ABNORMAL LOW (ref 3.5–5.0)
BUN: 6.5 mg/dL — ABNORMAL LOW (ref 7.0–26.0)
CALCIUM: 9.2 mg/dL (ref 8.4–10.4)
CHLORIDE: 103 meq/L (ref 98–109)
CO2: 27 mEq/L (ref 22–29)
Creatinine: 0.6 mg/dL (ref 0.6–1.1)
Glucose: 95 mg/dl (ref 70–140)
Potassium: 3.8 mEq/L (ref 3.5–5.1)
SODIUM: 137 meq/L (ref 136–145)
Total Bilirubin: 0.63 mg/dL (ref 0.20–1.20)
Total Protein: 6.6 g/dL (ref 6.4–8.3)

## 2015-03-13 LAB — PROTIME-INR
INR: 2.3 (ref 2.00–3.50)
Protime: 27.6 Seconds — ABNORMAL HIGH (ref 10.6–13.4)

## 2015-03-13 MED ORDER — SODIUM CHLORIDE 0.9 % IV SOLN: Freq: Once | INTRAVENOUS | Status: DC

## 2015-03-13 MED ORDER — ACETAMINOPHEN 325 MG PO TABS
ORAL_TABLET | ORAL | Status: AC
  Filled 2015-03-13: qty 2

## 2015-03-13 MED ORDER — SODIUM CHLORIDE 0.9 % IJ SOLN
10.0000 mL | INTRAMUSCULAR | Status: DC | PRN
  Administered 2015-03-13: 10 mL
  Filled 2015-03-13: qty 10

## 2015-03-13 MED ORDER — DIPHENHYDRAMINE HCL 50 MG/ML IJ SOLN
25.0000 mg | Freq: Once | INTRAMUSCULAR | Status: AC
  Administered 2015-03-13: 25 mg via INTRAVENOUS

## 2015-03-13 MED ORDER — ACETAMINOPHEN 325 MG PO TABS
650.0000 mg | ORAL_TABLET | Freq: Once | ORAL | Status: AC
  Administered 2015-03-13: 650 mg via ORAL

## 2015-03-13 MED ORDER — FAMOTIDINE IN NACL 20-0.9 MG/50ML-% IV SOLN
20.0000 mg | Freq: Once | INTRAVENOUS | Status: AC
  Administered 2015-03-13: 20 mg via INTRAVENOUS

## 2015-03-13 MED ORDER — HEPARIN SOD (PORK) LOCK FLUSH 100 UNIT/ML IV SOLN
500.0000 [IU] | Freq: Once | INTRAVENOUS | Status: AC | PRN
  Administered 2015-03-13: 500 [IU]
  Filled 2015-03-13: qty 5

## 2015-03-13 MED ORDER — RAMUCIRUMAB CHEMO INJECTION 500 MG/50ML
8.0000 mg/kg | Freq: Once | INTRAVENOUS | Status: AC
  Administered 2015-03-13: 500 mg via INTRAVENOUS
  Filled 2015-03-13: qty 50

## 2015-03-13 MED ORDER — SODIUM CHLORIDE 0.9 % IJ SOLN
10.0000 mL | INTRAMUSCULAR | Status: DC | PRN
  Administered 2015-03-13: 10 mL via INTRAVENOUS
  Filled 2015-03-13: qty 10

## 2015-03-13 MED ORDER — DIPHENHYDRAMINE HCL 50 MG/ML IJ SOLN
INTRAMUSCULAR | Status: AC
  Filled 2015-03-13: qty 1

## 2015-03-13 MED ORDER — SODIUM CHLORIDE 0.9 % IV SOLN
Freq: Once | INTRAVENOUS | Status: AC
  Administered 2015-03-13: 12:00:00 via INTRAVENOUS
  Filled 2015-03-13: qty 4

## 2015-03-13 MED ORDER — SODIUM CHLORIDE 0.9 % IV SOLN
Freq: Once | INTRAVENOUS | Status: AC
  Administered 2015-03-13: 11:00:00 via INTRAVENOUS

## 2015-03-13 MED ORDER — PACLITAXEL CHEMO INJECTION 300 MG/50ML
80.0000 mg/m2 | Freq: Once | INTRAVENOUS | Status: AC
  Administered 2015-03-13: 144 mg via INTRAVENOUS
  Filled 2015-03-13: qty 24

## 2015-03-13 MED ORDER — FAMOTIDINE IN NACL 20-0.9 MG/50ML-% IV SOLN
INTRAVENOUS | Status: AC
  Filled 2015-03-13: qty 50

## 2015-03-13 NOTE — Patient Instructions (Signed)
Continue Coumadin to 4mg  daily except 2 mg Tuesdays and Fridays. Recheck INR with scheduled appointments on 8/11: Lab at 9:30am, flush at 9;30am, treatment at 10:15 and coumadin clinic at 10:30am.

## 2015-03-13 NOTE — Progress Notes (Signed)
[place] OFFICE PROGRESS NOTE   Diagnosis:  Gastric cancer  INTERVAL HISTORY:   Alicia [name] returns as scheduled. She completed cycle 3 Taxol/cyramza on 02/27/2015. She has minimal nausea. No mouth sores. She has constipation that is relieved with a laxative. Pain is well controlled with morphine. She has mild stable numbness in the hands and feet. She describes her appetite as "so-so".  Objective:  Vital signs in last 24 hours:  Blood pressure 148/103, pulse 106, temperature 98.6 F (37 C), temperature source Oral, resp. rate 17, height 5' (1.524 m), weight 142 lb 6.4 oz (64.592 kg), SpO2 100 %. repeat heart rate 88    HEENT: No thrush or ulcers. Lymphatics: No palpable cervical or supra-clavicular lymph nodes. Resp: Lungs clear bilaterally. Cardio: Regular rate and rhythm. GI: Abdomen is soft and nontender. No hepatomegaly. Masslike fullness surrounding the umbilicus. Vascular: No leg edema. Neuro: Vibratory sense intact over the fingertips per tuning fork exam.  Skin: No rash. Port-A-Cath without erythema.    Lab Results:  Lab Results  Component Value Date   WBC 5.2 02/27/2015   HGB 9.1* 02/27/2015   HCT 28.5* 02/27/2015   MCV 90.5 02/27/2015   PLT 423* 02/27/2015   NEUTROABS 3.3 02/27/2015    Imaging:  No results found.  Medications: I have reviewed the patient's current medications.  Assessment/Plan: 1. Stage IV gastric cancer  Status post bilateral oophorectomy, total abdominal hysterectomy, omentectomy, resection of abdominal wall and pelvic masses, bilateral pelvic lymphadenectomy 10/18/2013 with the pathology confirming moderate to poorly differentiated adenocarcinoma  Stomach biopsy 11/28/2013 revealed adenocarcinoma  Status post 12 cycles of FOLFOX, last given 04/25/2014  Restaging CT 04/29/2014 with thickening of the stomach and nodular densities adjacent to the greater curvature-unchanged, faint groundglass nodularity in the  lungs-nonspecific  Restaging CT 07/22/2014 with indeterminate lung nodules; moderate wall thickening in the body region of the stomach; fairly extensive omental disease and findings suspicious for abdominal wall and incisional disease.  Started systemic chemotherapy with FOLFIRI on 07/30/14.  Restaging CT evaluation 10/14/2014 following 5 cycles of FOLFIRI showed decreased nodularity in both lungs; overall stable omental nodularity; involution of the previously demonstrated ill-defined soft tissue mass involving the anterior abdominal wall incision.  Cycle 6 FOLFIRI 10/15/2014  Cycle 7 FOLFIRI 10/29/2014  Cycle 8 FOLFIRI 11/12/2014  Cycle 9 FOLFIRI 11/26/2014 (dose reduction of 5-fluorouracil due to mucositis)  CT abdomen/pelvis 12/05/2014 revealed mild progression of peritoneal/omental tumor  Initiation of weekly Taxol (3 weeks on/1 week off) and every 2 week Ramucirumab 12/17/2014  CT abdomen/pelvis 01/08/2015 with findings worrisome for distal small bowel obstruction presumed secondary to adhesions. Apparent right-sided ureteral obstruction possibly secondary to developing adhesions; interval development of a small amount of intra-abdominal ascites. Grossly unchanged peritoneal carcinomatosis. Unchanged 1.4 cm sclerotic osseous metastasis within the right ilium.  Cycle 2 Taxol/Ramucirumab beginning 01/16/2015  Cycle 3 Taxol/ Ramucirumab beginning 02/13/2015  Cycle 4 Taxol/ Ramucirumab beginning 03/13/2015   2. Oxaliplatin neuropathy   3. Bilateral upper and lower lobe pulmonary emboli within segmental branches-now maintained on Coumadin. Followed by Coumadin clinic at the [facility].   4. Neutropenia related to chemotherapy 08/13/2014. Cycle 2 FOLFIRI held. Neulasta added beginning with cycle 2 on 08/20/2014.   5. Nausea following chemotherapy-the anti-emetic regimen was adjusted with cycle 4 FOLFIRI 09/17/2014   6. Diarrhea following FOLFIRI-no improvement  with Imodium. Lomotil initiated 10/15/2014.    7. Mouth sores following cycle 8 FOLFIRI. 5 fluorouracil dose reduced.   8. Admission 12/05/2014 with nausea/vomiting  and diarrhea-likely secondary to FOLFIRI chemotherapy   9. Abdominal pain secondary to carcinomatosis and gastric tumor   10. 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 Admission 01/09/2015 with abdominal pain and nausea/vomiting-a CT confirmed a distal small bowel obstruction. Resolved with bowel rest.   12. Depression. She is currently on Zoloft 100 mg daily.     13. Elevated blood pressure. Currently on Norvasc 5 mg daily.     Disposition: Alicia [name] appears stable. She has completed 3 cycles of Taxol/cyramza. Plan to proceed with cycle 4 today as scheduled. We are referring her for restaging CT scans following the completion of cycle 4.  Blood pressure remains elevated despite Norvasc 5 mg daily. We will repeat her blood pressure in the chemotherapy room. If still elevated she will increase the Norvasc to 10 mg daily.  She will return for a follow-up visit in 2 weeks. She understands to contact the office in the interim with any problems.  Plan reviewed with Dr. Benay Spice.     Ned Card ANP/GNP-BC   03/13/2015  10:24 AM

## 2015-03-13 NOTE — Telephone Encounter (Signed)
Pt confirmed labs/ov per 07/28 POF, gave pt AVS and Calendar.Cherylann Banas, sent msg to add chemo

## 2015-03-13 NOTE — Progress Notes (Addendum)
INR within goal today. Hg/Hct:  8.7/27.8, Pltc = 431 Pt seen in infusion area. Translator left and unable to return. Used the very little Spanish I know to communicate with patient. Will continue same coumadin dose. Continue Coumadin to 4mg  daily except 2 mg Tuesdays and Fridays. Recheck INR with scheduled appointments on 8/11: Lab at 9:30am, flush at 9;30am, treatment at 10:15 and coumadin clinic at 10:30am. No charge encounter.

## 2015-03-13 NOTE — Patient Instructions (Signed)

## 2015-03-13 NOTE — Patient Instructions (Signed)
[facility] Discharge Instructions for Patients Receiving Chemotherapy  Today you received the following chemotherapy agents Cyramza/Paclitaxel.  To help prevent nausea and vomiting after your treatment, we encourage you to take your nausea medication as directed.    If you develop nausea and vomiting that is not controlled by your nausea medication, call the clinic.   BELOW ARE SYMPTOMS THAT SHOULD BE REPORTED IMMEDIATELY:  *FEVER GREATER THAN 100.5 F  *CHILLS WITH OR WITHOUT FEVER  NAUSEA AND VOMITING THAT IS NOT CONTROLLED WITH YOUR NAUSEA MEDICATION  *UNUSUAL SHORTNESS OF BREATH  *UNUSUAL BRUISING OR BLEEDING  TENDERNESS IN MOUTH AND THROAT WITH OR WITHOUT PRESENCE OF ULCERS  *URINARY PROBLEMS  *BOWEL PROBLEMS  UNUSUAL RASH Items with * indicate a potential emergency and should be followed up as soon as possible.  Feel free to call the clinic you have any questions or concerns. The clinic phone number is [phone].  Please show the [facility] at check-in to the Emergency Department and triage nurse.

## 2015-03-20 ENCOUNTER — Ambulatory Visit (HOSPITAL_BASED_OUTPATIENT_CLINIC_OR_DEPARTMENT_OTHER): Payer: Medicaid Other

## 2015-03-20 ENCOUNTER — Encounter (HOSPITAL_COMMUNITY): Payer: Self-pay

## 2015-03-20 ENCOUNTER — Inpatient Hospital Stay (HOSPITAL_COMMUNITY): Payer: Medicaid Other

## 2015-03-20 ENCOUNTER — Ambulatory Visit (HOSPITAL_COMMUNITY)
Admission: RE | Admit: 2015-03-20 | Discharge: 2015-03-20 | Disposition: A | Discharge status: Home / Self Care | Payer: Medicaid Other | Source: Ambulatory Visit | Attending: Nurse Practitioner | Admitting: Nurse Practitioner

## 2015-03-20 ENCOUNTER — Inpatient Hospital Stay (HOSPITAL_COMMUNITY)
Admission: AD | Admit: 2015-03-20 | Discharge: 2015-03-25 | DRG: 389 | Disposition: A | Discharge status: Home / Self Care | Payer: Medicaid Other | Source: Ambulatory Visit | Attending: Internal Medicine | Admitting: Internal Medicine

## 2015-03-20 ENCOUNTER — Other Ambulatory Visit: Payer: Self-pay

## 2015-03-20 ENCOUNTER — Encounter: Payer: Self-pay | Admitting: Nurse Practitioner

## 2015-03-20 ENCOUNTER — Ambulatory Visit (HOSPITAL_BASED_OUTPATIENT_CLINIC_OR_DEPARTMENT_OTHER): Payer: Self-pay | Admitting: Nurse Practitioner

## 2015-03-20 ENCOUNTER — Other Ambulatory Visit (HOSPITAL_BASED_OUTPATIENT_CLINIC_OR_DEPARTMENT_OTHER): Payer: Medicaid Other

## 2015-03-20 ENCOUNTER — Ambulatory Visit: Payer: Medicaid Other

## 2015-03-20 DIAGNOSIS — R1110 Vomiting, unspecified: Secondary | ICD-10-CM

## 2015-03-20 DIAGNOSIS — R109 Unspecified abdominal pain: Secondary | ICD-10-CM | POA: Diagnosis present

## 2015-03-20 DIAGNOSIS — Z888 Allergy status to other drugs, medicaments and biological substances status: Secondary | ICD-10-CM | POA: Diagnosis not present

## 2015-03-20 DIAGNOSIS — R188 Other ascites: Secondary | ICD-10-CM | POA: Diagnosis present

## 2015-03-20 DIAGNOSIS — D62 Acute posthemorrhagic anemia: Secondary | ICD-10-CM | POA: Diagnosis present

## 2015-03-20 DIAGNOSIS — R112 Nausea with vomiting, unspecified: Secondary | ICD-10-CM

## 2015-03-20 DIAGNOSIS — Z79899 Other long term (current) drug therapy: Secondary | ICD-10-CM

## 2015-03-20 DIAGNOSIS — K567 Ileus, unspecified: Secondary | ICD-10-CM | POA: Diagnosis present

## 2015-03-20 DIAGNOSIS — R1013 Epigastric pain: Secondary | ICD-10-CM

## 2015-03-20 DIAGNOSIS — Z5181 Encounter for therapeutic drug level monitoring: Secondary | ICD-10-CM | POA: Diagnosis not present

## 2015-03-20 DIAGNOSIS — E876 Hypokalemia: Secondary | ICD-10-CM | POA: Diagnosis present

## 2015-03-20 DIAGNOSIS — Z7901 Long term (current) use of anticoagulants: Secondary | ICD-10-CM

## 2015-03-20 DIAGNOSIS — Z86711 Personal history of pulmonary embolism: Secondary | ICD-10-CM | POA: Diagnosis not present

## 2015-03-20 DIAGNOSIS — K3184 Gastroparesis: Secondary | ICD-10-CM | POA: Diagnosis present

## 2015-03-20 DIAGNOSIS — M899 Disorder of bone, unspecified: Secondary | ICD-10-CM | POA: Insufficient documentation

## 2015-03-20 DIAGNOSIS — Z90710 Acquired absence of both cervix and uterus: Secondary | ICD-10-CM | POA: Diagnosis not present

## 2015-03-20 DIAGNOSIS — C7951 Secondary malignant neoplasm of bone: Secondary | ICD-10-CM | POA: Diagnosis not present

## 2015-03-20 DIAGNOSIS — I2699 Other pulmonary embolism without acute cor pulmonale: Secondary | ICD-10-CM | POA: Diagnosis present

## 2015-03-20 DIAGNOSIS — D6481 Anemia due to antineoplastic chemotherapy: Secondary | ICD-10-CM

## 2015-03-20 DIAGNOSIS — I10 Essential (primary) hypertension: Secondary | ICD-10-CM | POA: Diagnosis present

## 2015-03-20 DIAGNOSIS — IMO0001 Reserved for inherently not codable concepts without codable children: Secondary | ICD-10-CM

## 2015-03-20 DIAGNOSIS — C786 Secondary malignant neoplasm of retroperitoneum and peritoneum: Secondary | ICD-10-CM | POA: Diagnosis not present

## 2015-03-20 DIAGNOSIS — Z95828 Presence of other vascular implants and grafts: Secondary | ICD-10-CM

## 2015-03-20 DIAGNOSIS — Z452 Encounter for adjustment and management of vascular access device: Secondary | ICD-10-CM

## 2015-03-20 DIAGNOSIS — C169 Malignant neoplasm of stomach, unspecified: Secondary | ICD-10-CM

## 2015-03-20 DIAGNOSIS — G620 Drug-induced polyneuropathy: Secondary | ICD-10-CM | POA: Diagnosis present

## 2015-03-20 DIAGNOSIS — K5660 Unspecified intestinal obstruction: Principal | ICD-10-CM | POA: Diagnosis present

## 2015-03-20 DIAGNOSIS — D638 Anemia in other chronic diseases classified elsewhere: Secondary | ICD-10-CM | POA: Diagnosis present

## 2015-03-20 DIAGNOSIS — Z833 Family history of diabetes mellitus: Secondary | ICD-10-CM

## 2015-03-20 DIAGNOSIS — K5900 Constipation, unspecified: Secondary | ICD-10-CM

## 2015-03-20 DIAGNOSIS — Z8249 Family history of ischemic heart disease and other diseases of the circulatory system: Secondary | ICD-10-CM | POA: Diagnosis not present

## 2015-03-20 DIAGNOSIS — R030 Elevated blood-pressure reading, without diagnosis of hypertension: Secondary | ICD-10-CM

## 2015-03-20 DIAGNOSIS — N131 Hydronephrosis with ureteral stricture, not elsewhere classified: Secondary | ICD-10-CM | POA: Diagnosis present

## 2015-03-20 DIAGNOSIS — Z79891 Long term (current) use of opiate analgesic: Secondary | ICD-10-CM | POA: Diagnosis not present

## 2015-03-20 DIAGNOSIS — F329 Major depressive disorder, single episode, unspecified: Secondary | ICD-10-CM | POA: Diagnosis present

## 2015-03-20 DIAGNOSIS — T451X5A Adverse effect of antineoplastic and immunosuppressive drugs, initial encounter: Secondary | ICD-10-CM | POA: Diagnosis present

## 2015-03-20 DIAGNOSIS — N1330 Unspecified hydronephrosis: Secondary | ICD-10-CM | POA: Diagnosis not present

## 2015-03-20 DIAGNOSIS — E860 Dehydration: Secondary | ICD-10-CM

## 2015-03-20 LAB — CBC WITH DIFFERENTIAL/PLATELET
BASO%: 0.9 % (ref 0.0–2.0)
BASOS ABS: 0.1 10*3/uL (ref 0.0–0.1)
EOS%: 0.5 % (ref 0.0–7.0)
Eosinophils Absolute: 0 10*3/uL (ref 0.0–0.5)
HCT: 27.9 % — ABNORMAL LOW (ref 34.8–46.6)
HGB: 8.8 g/dL — ABNORMAL LOW (ref 11.6–15.9)
LYMPH#: 1.6 10*3/uL (ref 0.9–3.3)
LYMPH%: 22 % (ref 14.0–49.7)
MCH: 28.2 pg (ref 25.1–34.0)
MCHC: 31.4 g/dL — ABNORMAL LOW (ref 31.5–36.0)
MCV: 89.7 fL (ref 79.5–101.0)
MONO#: 0.5 10*3/uL (ref 0.1–0.9)
MONO%: 6.6 % (ref 0.0–14.0)
NEUT#: 5 10*3/uL (ref 1.5–6.5)
NEUT%: 70 % (ref 38.4–76.8)
Platelets: 439 10*3/uL — ABNORMAL HIGH (ref 145–400)
RBC: 3.11 10*6/uL — AB (ref 3.70–5.45)
RDW: 22.3 % — ABNORMAL HIGH (ref 11.2–14.5)
WBC: 7.2 10*3/uL (ref 3.9–10.3)

## 2015-03-20 LAB — COMPREHENSIVE METABOLIC PANEL (CC13)
ALBUMIN: 2.9 g/dL — AB (ref 3.5–5.0)
ALT: 7 U/L (ref 0–55)
AST: 13 U/L (ref 5–34)
Alkaline Phosphatase: 117 U/L (ref 40–150)
Anion Gap: 7 mEq/L (ref 3–11)
BILIRUBIN TOTAL: 0.59 mg/dL (ref 0.20–1.20)
BUN: 6.5 mg/dL — AB (ref 7.0–26.0)
CO2: 25 mEq/L (ref 22–29)
Calcium: 8.8 mg/dL (ref 8.4–10.4)
Chloride: 105 mEq/L (ref 98–109)
Creatinine: 0.6 mg/dL (ref 0.6–1.1)
GLUCOSE: 148 mg/dL — AB (ref 70–140)
POTASSIUM: 3.5 meq/L (ref 3.5–5.1)
Sodium: 137 mEq/L (ref 136–145)
Total Protein: 6.1 g/dL — ABNORMAL LOW (ref 6.4–8.3)

## 2015-03-20 LAB — URINALYSIS, ROUTINE W REFLEX MICROSCOPIC
Bilirubin Urine: NEGATIVE
Glucose, UA: NEGATIVE mg/dL
HGB URINE DIPSTICK: NEGATIVE
Ketones, ur: NEGATIVE mg/dL
NITRITE: NEGATIVE
PH: 6.5 (ref 5.0–8.0)
PROTEIN: NEGATIVE mg/dL
SPECIFIC GRAVITY, URINE: 1.01 (ref 1.005–1.030)
UROBILINOGEN UA: 1 mg/dL (ref 0.0–1.0)

## 2015-03-20 LAB — URINE MICROSCOPIC-ADD ON

## 2015-03-20 LAB — PROTIME-INR
INR: 2.2 (ref 2.00–3.50)
Protime: 26.4 Seconds — ABNORMAL HIGH (ref 10.6–13.4)

## 2015-03-20 MED ORDER — HYDROMORPHONE HCL 2 MG/ML IJ SOLN
2.0000 mg | INTRAMUSCULAR | Status: DC | PRN
  Administered 2015-03-20 – 2015-03-24 (×19): 2 mg via INTRAVENOUS
  Filled 2015-03-20 (×20): qty 1

## 2015-03-20 MED ORDER — WARFARIN - PHARMACIST DOSING INPATIENT: Freq: Every day | Status: DC

## 2015-03-20 MED ORDER — ACETAMINOPHEN 325 MG PO TABS: 650.0000 mg | ORAL_TABLET | Freq: Four times a day (QID) | ORAL | Status: DC | PRN

## 2015-03-20 MED ORDER — SODIUM CHLORIDE 0.9 % IV SOLN
INTRAVENOUS | Status: AC
  Administered 2015-03-20: 13:00:00 via INTRAVENOUS

## 2015-03-20 MED ORDER — PROMETHAZINE HCL 25 MG/ML IJ SOLN
12.5000 mg | Freq: Four times a day (QID) | INTRAMUSCULAR | Status: DC | PRN
  Administered 2015-03-21: 12.5 mg via INTRAVENOUS
  Filled 2015-03-20 (×2): qty 1

## 2015-03-20 MED ORDER — SODIUM CHLORIDE 0.9 % IV SOLN
INTRAVENOUS | Status: DC
  Administered 2015-03-20 – 2015-03-21 (×2): via INTRAVENOUS
  Administered 2015-03-21: 1000 mL via INTRAVENOUS
  Administered 2015-03-22 – 2015-03-23 (×4): via INTRAVENOUS

## 2015-03-20 MED ORDER — WARFARIN SODIUM 4 MG PO TABS
4.0000 mg | ORAL_TABLET | Freq: Once | ORAL | Status: AC
  Administered 2015-03-20: 4 mg via ORAL
  Filled 2015-03-20: qty 1

## 2015-03-20 MED ORDER — OXYCODONE HCL 5 MG PO TABS
5.0000 mg | ORAL_TABLET | Freq: Four times a day (QID) | ORAL | Status: DC | PRN
  Administered 2015-03-21: 5 mg via ORAL
  Filled 2015-03-20: qty 1

## 2015-03-20 MED ORDER — SODIUM CHLORIDE 0.9 % IJ SOLN
3.0000 mL | Freq: Two times a day (BID) | INTRAMUSCULAR | Status: DC
  Administered 2015-03-25: 3 mL via INTRAVENOUS

## 2015-03-20 MED ORDER — LABETALOL HCL 5 MG/ML IV SOLN
10.0000 mg | INTRAVENOUS | Status: DC | PRN
  Filled 2015-03-20: qty 4

## 2015-03-20 MED ORDER — ONDANSETRON HCL 4 MG PO TABS
4.0000 mg | ORAL_TABLET | Freq: Four times a day (QID) | ORAL | Status: DC | PRN
Start: 2015-03-20 — End: 2015-03-25

## 2015-03-20 MED ORDER — IOHEXOL 300 MG/ML  SOLN
100.0000 mL | Freq: Once | INTRAMUSCULAR | Status: AC | PRN
  Administered 2015-03-20: 100 mL via INTRAVENOUS

## 2015-03-20 MED ORDER — SODIUM CHLORIDE 0.9 % IJ SOLN
10.0000 mL | INTRAMUSCULAR | Status: DC | PRN
  Administered 2015-03-20: 10 mL via INTRAVENOUS
  Filled 2015-03-20: qty 10

## 2015-03-20 MED ORDER — SODIUM CHLORIDE 0.9 % IV SOLN
Freq: Once | INTRAVENOUS | Status: AC
  Administered 2015-03-20: 13:00:00 via INTRAVENOUS
  Filled 2015-03-20: qty 4

## 2015-03-20 MED ORDER — IOHEXOL 300 MG/ML  SOLN
25.0000 mL | INTRAMUSCULAR | Status: AC
  Administered 2015-03-20: 25 mL via ORAL

## 2015-03-20 MED ORDER — ONDANSETRON HCL 4 MG/2ML IJ SOLN
4.0000 mg | Freq: Four times a day (QID) | INTRAMUSCULAR | Status: DC | PRN
  Administered 2015-03-21: 4 mg via INTRAVENOUS
  Filled 2015-03-20: qty 2

## 2015-03-20 MED ORDER — IOHEXOL 300 MG/ML  SOLN: 25.0000 mL | INTRAMUSCULAR | Status: AC

## 2015-03-20 MED ORDER — ACETAMINOPHEN 650 MG RE SUPP: 650.0000 mg | Freq: Four times a day (QID) | RECTAL | Status: DC | PRN

## 2015-03-20 MED ORDER — AMLODIPINE BESYLATE 5 MG PO TABS
5.0000 mg | ORAL_TABLET | Freq: Every day | ORAL | Status: DC
  Administered 2015-03-21 – 2015-03-25 (×5): 5 mg via ORAL
  Filled 2015-03-20 (×5): qty 1

## 2015-03-20 NOTE — Progress Notes (Unsigned)
OK to do treatment today with CMET from 03/13/15  Rosalio Macadamia RN/Dr. Benay Spice Patient has experienced vomiting X9 since last night.  CMET drawn.  Selena Lesser NP here to see patient (along with Julie/interpreter). 1155:  Patient to radiology for KUB.

## 2015-03-20 NOTE — Assessment & Plan Note (Addendum)
Patient received cycle 4, day 1 of her paclitaxel/Cyramza chemotherapy on 03/13/2015.  Blood counts obtained today revealed a WBC of 7.2, ANC of 5.0, hemoglobin 8.8, platelet count 439.  Patient was scheduled to receive cycle 4, day 8 of the same chemotherapy today; the decision was made to hold chemotherapy today due to intractable nausea/vomiting and dehydration.  Patient will be corrected medically to the hospital with diagnoses of intractable nausea/vomiting and dehydration.  The plan is for the patient to return on 03/27/2015 for labs, visit, and her next cycle of chemotherapy.

## 2015-03-20 NOTE — Assessment & Plan Note (Signed)
Patient is complaining of some chronic constipation; despite taking the stool softeners, [place] Regular basis.  Patient was encouraged to continue with stool softeners twice a day and to use a very lax as often as every 6 hours until bowels are cleared.

## 2015-03-20 NOTE — Assessment & Plan Note (Addendum)
Patient states that she has been suffering with some chronic nausea and intermittent vomiting since initiation of her chemotherapy.  However, patient has had increased nausea and vomiting a total of 9 times since last night; despite taking her antinausea medications as directed.  Patient appears dehydrated today.  She is afebrile.  She does appear fatigued, weak, and dehydrated today.  KUB obtained today revealed no obstruction.  Patient received Zofran 8 mg IV and IV fluid rehydration while at the cancer center.  Patient initially stated that she was feeling somewhat better; and requested to take sips of soup.  However, patient began vomiting/retching once again when she tried to take in fluids.  Decision was made for patient to be direct admitted to the hospital for intractable nausea/vomiting and dehydration.  Of note- patient's systolic blood pressure dropped to the mid 90s just prior to her transfer to the hospital- most likely, secondary to fairly severe dehydration.  Brief history and report were called to Dr. Coralyn Pear hospitalist prior to transferring the patient to room #1323 via wheelchair per the [facility].  1

## 2015-03-20 NOTE — Progress Notes (Signed)
SYMPTOM MANAGEMENT CLINIC   HPI: Alicia [name] 54 y.o. female diagnosed with gastric cancer.  Currently undergoing paclitaxel/Cyramza chemotherapy.  Patient received cycle 4, day 1 of her chemotherapy on 03/13/2015.  She presented to the [facility] today for cycle 4, day 8 of the same regimen.  However, patient states that her chronic nausea and occasional vomiting has now progressed; and she has had 9 episodes of vomiting since last night despite taking her antinausea medications as directed.  Patient is complaining of mild increased generalized abdominal discomfort.  Patient is also complaining of some constipation as well.  Patient has a history of obstruction in the past.  Patient denies any recent fevers or chills.  HPI  ROS  Past Medical History  Diagnosis Date  . 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 PE (pulmonary embolism) 05/17/2014  . Gastric cancer 06/06/2014  . Constipation 09/04/2014    Past Surgical History  Procedure Laterality Date  . Abdominal hysterectomy  10/18/2013     has Depression; Pulmonary nodules/lesions, multiple; PE (pulmonary embolism); Anticoagulated on Coumadin; Gastric cancer; Neoplasm related pain; Neuropathy due to chemotherapeutic drug; Long term current use of anticoagulant therapy; Nausea with vomiting; Constipation; Financial difficulties; Abdominal pain; Acute diarrhea; SBO (small bowel obstruction); Dehydration; Hematuria; Nausea & vomiting; and Antineoplastic chemotherapy induced anemia on her problem list.    is allergic to xarelto.    Medication List    Notice    This visit is during an admission. Changes to the med list made in this visit will be reflected in the After Visit Summary of the admission.       PHYSICAL EXAMINATION  Oncology Vitals 03/20/2015 03/20/2015 03/20/2015 03/20/2015 03/13/2015 03/13/2015 03/13/2015  Height - 158 cm 158 cm - - - 152 cm  Weight - 64.275 kg 64.275 kg - - - 64.592 kg  Weight  (lbs) - 141 lbs 11 oz 141 lbs 11 oz - - - 142 lbs 6 oz  BMI (kg/m2) - 25.92 kg/m2 25.92 kg/m2 - - - 27.81 kg/m2  Temp 98.6 - 98.5 98.7 - - 98.6  Pulse 89 - 89 96 88 97 106  Resp 18 - 16 18 - - 17  SpO2 97 - 99 99 - - 100  BSA (m2) - 1.68 m2 1.68 m2 - - - 1.65 m2   BP Readings from Last 3 Encounters:  03/20/15 111/83  03/20/15 138/96  03/13/15 120/77    Physical Exam  Constitutional: She is oriented to person, place, and time.  Patient appears fatigued, weak, and chronically ill.  HENT:  Head: Normocephalic and atraumatic.  Mouth/Throat: Oropharynx is clear and moist.  Eyes: Conjunctivae and EOM are normal. Pupils are equal, round, and reactive to light. Right eye exhibits no discharge. Left eye exhibits no discharge. No scleral icterus.  Neck: Normal range of motion. Neck supple. No JVD present. No tracheal deviation present. No thyromegaly present.  Cardiovascular: Normal rate, regular rhythm, normal heart sounds and intact distal pulses.   Pulmonary/Chest: Effort normal and breath sounds normal. No respiratory distress. She has no wheezes. She has no rales. She exhibits no tenderness.  Abdominal: Soft. Bowel sounds are normal. She exhibits no distension and no mass. There is tenderness. There is no rebound and no guarding.  Musculoskeletal: Normal range of motion. She exhibits no edema or tenderness.  Lymphadenopathy:    She has no cervical adenopathy.  Neurological: She is alert and oriented to person, place, and time. Gait normal.  Skin: Skin is warm and dry. No rash noted. No erythema. There is pallor.  Psychiatric: Affect normal.  Nursing note and vitals reviewed.   LABORATORY DATA:. Admission on 03/20/2015  Component Date Value Ref Range Status  . Color, Urine 03/20/2015 YELLOW  YELLOW Final  . APPearance 03/20/2015 CLOUDY* CLEAR Final  . Specific Gravity, Urine 03/20/2015 1.010  1.005 - 1.030 Final  . pH 03/20/2015 6.5  5.0 - 8.0 Final  . Glucose, UA 03/20/2015  NEGATIVE  NEGATIVE mg/dL Final  . Hgb urine dipstick 03/20/2015 NEGATIVE  NEGATIVE Final  . Bilirubin Urine 03/20/2015 NEGATIVE  NEGATIVE Final  . Ketones, ur 03/20/2015 NEGATIVE  NEGATIVE mg/dL Final  . Protein, ur 03/20/2015 NEGATIVE  NEGATIVE mg/dL Final  . Urobilinogen, UA 03/20/2015 1.0  0.0 - 1.0 mg/dL Final  . Nitrite 03/20/2015 NEGATIVE  NEGATIVE Final  . Leukocytes, UA 03/20/2015 TRACE* NEGATIVE Final  . WBC, UA 03/20/2015 0-2  <3 WBC/hpf Final  Appointment on 03/20/2015  Component Date Value Ref Range Status  . Protime 03/20/2015 26.4* 10.6 - 13.4 Seconds Final  . INR 03/20/2015 2.20  2.00 - 3.50 Final   Comment: INR is useful only to assess adequacy of anticoagulation with coumadin when comparing results from different labs. It should not be used to estimate bleeding risk or presence/abscense of coagulopathy in patients not on coumadin. Expected INR ranges for  nontherapeutic patients is 0.88 - 1.12.   Marland Kitchen Lovenox 03/20/2015 No   Final  . WBC 03/20/2015 7.2  3.9 - 10.3 10e3/uL Final  . NEUT# 03/20/2015 5.0  1.5 - 6.5 10e3/uL Final  . HGB 03/20/2015 8.8* 11.6 - 15.9 g/dL Final  . HCT 03/20/2015 27.9* 34.8 - 46.6 % Final  . Platelets 03/20/2015 439* 145 - 400 10e3/uL Final  . MCV 03/20/2015 89.7  79.5 - 101.0 fL Final  . MCH 03/20/2015 28.2  25.1 - 34.0 pg Final  . MCHC 03/20/2015 31.4* 31.5 - 36.0 g/dL Final  . RBC 03/20/2015 3.11* 3.70 - 5.45 10e6/uL Final  . RDW 03/20/2015 22.3* 11.2 - 14.5 % Final  . lymph# 03/20/2015 1.6  0.9 - 3.3 10e3/uL Final  . MONO# 03/20/2015 0.5  0.1 - 0.9 10e3/uL Final  . Eosinophils Absolute 03/20/2015 0.0  0.0 - 0.5 10e3/uL Final  . Basophils Absolute 03/20/2015 0.1  0.0 - 0.1 10e3/uL Final  . NEUT% 03/20/2015 70.0  38.4 - 76.8 % Final  . LYMPH% 03/20/2015 22.0  14.0 - 49.7 % Final  . MONO% 03/20/2015 6.6  0.0 - 14.0 % Final  . EOS% 03/20/2015 0.5  0.0 - 7.0 % Final  . BASO% 03/20/2015 0.9  0.0 - 2.0 % Final  . Sodium 03/20/2015 137  136 -  145 mEq/L Final  . Potassium 03/20/2015 3.5  3.5 - 5.1 mEq/L Final  . Chloride 03/20/2015 105  98 - 109 mEq/L Final  . CO2 03/20/2015 25  22 - 29 mEq/L Final  . Glucose 03/20/2015 148* 70 - 140 mg/dl Final  . BUN 03/20/2015 6.5* 7.0 - 26.0 mg/dL Final  . Creatinine 03/20/2015 0.6  0.6 - 1.1 mg/dL Final  . Total Bilirubin 03/20/2015 0.59  0.20 - 1.20 mg/dL Final  . Alkaline Phosphatase 03/20/2015 117  40 - 150 U/L Final  . AST 03/20/2015 13  5 - 34 U/L Final  . ALT 03/20/2015 7  0 - 55 U/L Final  . Total Protein 03/20/2015 6.1* 6.4 - 8.3 g/dL Final  . Albumin 03/20/2015 2.9* 3.5 - 5.0 g/dL  Final  . Calcium 03/20/2015 8.8  8.4 - 10.4 mg/dL Final  . Anion Gap 03/20/2015 7  3 - 11 mEq/L Final  . EGFR 03/20/2015 >90  >90 ml/min/1.73 m2 Final   eGFR is calculated using the CKD-EPI Creatinine Equation (2009)     RADIOGRAPHIC STUDIES: Dg Abd 1 View  03/20/2015   CLINICAL DATA:  Mid abdominal pain and constipation for 1 week. Patient complains of vomiting 9 times in the last 24 hr. History of gastric cancer with the obstruction.  EXAM: ABDOMEN - 1 VIEW  COMPARISON:  01/15/2015  FINDINGS: The tip of central venous catheter is seen at cavoatrial junction. Lung bases are clear.  The bowel gas pattern is nonspecific/nonobstructive. There is sub pathologic gaseous distension of small and large bowel loops. No radio-opaque calculi. Again seen is ill-defined sclerotic lesion within the right iliac crest. Surgical clips in the pelvis are noted.  IMPRESSION: Nonspecific bowel gas pattern, without definite evidence of obstruction.  Sclerotic lesion within the right ilium. The concern for bony metastases persists.   Electronically Signed   By: Fidela Salisbury M.D.   On: 03/20/2015 12:38   Ct Abdomen Pelvis W Contrast  03/20/2015   CLINICAL DATA:  Stage IV gastric carcinoma. Intractable nausea and vomiting. Abdominal pain.  EXAM: CT ABDOMEN AND PELVIS WITH CONTRAST  TECHNIQUE: Multidetector CT imaging of the  abdomen and pelvis was performed using the standard protocol following bolus administration of intravenous contrast.  CONTRAST:  149m OMNIPAQUE IOHEXOL 300 MG/ML  SOLN  COMPARISON:  01/08/2015  FINDINGS: Lower Chest: No acute findings.  Hepatobiliary: No liver masses visualized. Mild perihepatic ascites and periportal edema are stable in appearance. Gallbladder is unremarkable.  Pancreas: No mass, inflammatory changes, or other significant abnormality identified.  Spleen:  Within normal limits in size and appearance.  Adrenals:  No masses identified.  Kidneys/Urinary Tract: Moderate right hydronephrosis and ureterectasis is increased since previous study. There is a transition point to nondilated distal right ureter near surgical clips adjacent to the right iliac vessels, suspicious for a ureteral stricture. No obstructing mass or calculi visualized.  Stomach/Bowel/Peritoneum: Annular constricting mass is again seen involving the body the stomach which shows no significant change, consistent with primary gastric carcinoma. Adjacent soft tissue nodularity in the gastrocolic ligament remains stable, consistent with peritoneal metastatic disease. An irregular soft tissue nodule in the end anterior abdominal wall soft tissues just above the umbilicus measuring 2.5 cm is stable and suspicious for neoplasm. Several mesenteric peritoneal nodules are seen in the right lower quadrant which are increased in size. Largest on image 55 measures 19 mm, compared with 11 mm previously. This is consistent with peritoneal metastatic disease. Mild ascites shows mild decrease since previous study.  Moderately dilated small bowel loops are again seen with a transition point in the right lower quadrant mesentery at the site of the peritoneal nodule described above. This is consistent with a mid to distal small bowel obstruction.  Vascular/Lymphatic: No pathologically enlarged lymph nodes identified. No abdominal aortic aneurysm or other  significant retroperitoneal abnormality demonstrated.  Reproductive: Prior hysterectomy noted. Adnexal regions are unremarkable in appearance.  Other:  None.  Musculoskeletal: Several small sclerotic bone lesions are seen in the spine and pelvis, a few which are new or mildly increased in size since previous study, consistent with sclerotic bone metastases.  IMPRESSION: Stable annular constricting mass involving the gastric body, consistent with primary gastric carcinoma. Stable adjacent soft tissue nodularity in gastrocolic ligament and anterior abdominal wall soft  tissues, consistent with peritoneal metastatic disease.  Increased mesenteric soft tissue nodules in the right lower quadrant, also consistent with peritoneal metastatic disease. This results and a mid to distal small bowel obstruction.  Mild ascites has decreased since previous study.  Mild increase in sclerotic bone metastases in the spine and pelvis.  Increased right hydroureteronephrosis with transition point in the right pelvis adjacent to right iliac surgical clips. This suggests possibility of ureteral stricture. No obstructing calculus or mass visualized.   Electronically Signed   By: Earle Gell M.D.   On: 03/20/2015 18:24    ASSESSMENT/PLAN:    Constipation Patient is complaining of some chronic constipation; despite taking the stool softeners, [place] Regular basis.  Patient was encouraged to continue with stool softeners twice a day and to use a very lax as often as every 6 hours until bowels are cleared.   Dehydration Patient states that she has been suffering with some chronic nausea and intermittent vomiting since initiation of her chemotherapy.  However, patient has had increased nausea and vomiting a total of 9 times since last night; despite taking her antinausea medications as directed.  Patient appears dehydrated today.  She is afebrile.  She does appear fatigued, weak, and dehydrated today.  KUB obtained today  revealed no obstruction.  Patient received Zofran 8 mg IV and IV fluid rehydration while at the cancer center.  Patient initially stated that she was feeling somewhat better; and requested to take sips of soup.  However, patient began vomiting/retching once again when she tried to take in fluids.  Decision was made for patient to be direct admitted to the hospital for intractable nausea/vomiting and dehydration.  Of note- patient's systolic blood pressure dropped to the mid 90s just prior to her transfer to the hospital- most likely, secondary to fairly severe dehydration.  Brief history and report were called to Dr. Coralyn Pear hospitalist prior to transferring the patient to room #1323 via wheelchair per the [facility].  1    Gastric cancer Patient received cycle 4, day 1 of her paclitaxel/Cyramza chemotherapy on 03/13/2015.  Blood counts obtained today revealed a WBC of 7.2, ANC of 5.0, hemoglobin 8.8, platelet count 439.  Patient was scheduled to receive cycle 4, day 8 of the same chemotherapy today; the decision was made to hold chemotherapy today due to intractable nausea/vomiting and dehydration.  Patient will be corrected medically to the hospital with diagnoses of intractable nausea/vomiting and dehydration.  The plan is for the patient to return on 03/27/2015 for labs, visit, and her next cycle of chemotherapy.  Long term current use of anticoagulant therapy Patient continues to take Coumadin on a regular basis for previously diagnosed pulmonary embolism.  Patient requested and was given additional Coumadin samples per the [facility] today.  Antineoplastic chemotherapy induced anemia Chemotherapy-induced anemia noted today with hemoglobin 8.8.  Patient denies any worsening issues with either shortness of breath or easy fatigue.  We'll continue to monitor closely.  Nausea with vomiting Patient states that she has been suffering with some chronic nausea and  intermittent vomiting since initiation of her chemotherapy.  However, patient has had increased nausea and vomiting a total of 9 times since last night; despite taking her antinausea medications as directed.  Patient has a history of obstruction in the past.  Patient appears dehydrated today.  She is afebrile.  She does appear fatigued, weak, and dehydrated today.  KUB obtained today revealed no obstruction.  Patient received Zofran 8 mg IV  and IV fluid rehydration while at the cancer center.  Patient initially stated that she was feeling somewhat better; and requested to take sips of soup.  However, patient began vomiting/retching once again when she tried to take in fluids.  Decision was made for patient to be direct admitted to the hospital for intractable nausea/vomiting and dehydration.  Of note- patient's systolic blood pressure dropped to the mid 90s just prior to her transfer to the hospital- most likely, secondary to fairly severe dehydration.  Brief history and report were called to Dr. Coralyn Pear hospitalist prior to transferring the patient to room #1323 via wheelchair per the [facility].       Patient stated understanding of all instructions; and was in agreement with this plan of care. The patient knows to call the clinic with any problems, questions or concerns.   This was a shared visit with Dr. Benay Spice today.  Total time spent with patient was 40 minutes;  with greater than 75 percent of that time spent in face to face counseling regarding patient's symptoms,  and coordination of care and follow up.  Disclaimer: This note was dictated with voice recognition software. Similar sounding words can inadvertently be transcribed and may not be corrected upon review.   Drue Second, NP 03/20/2015   This was a shared visit with Drue Second. Ms. Sainato was interviewed and examined. She has intractable nausea and vomiting. This is most likely not related to treatment.  We are concerned she has a bowel obstruction. She will be admitted for further evaluation and supportive care.  Julieanne Manson, M.D.

## 2015-03-20 NOTE — Progress Notes (Signed)
PATIENT DOES NOT SPEAK ENGLISH-NEED SPANISH INTERPRETER

## 2015-03-20 NOTE — Patient Instructions (Signed)

## 2015-03-20 NOTE — Assessment & Plan Note (Signed)
Patient continues to take Coumadin on a regular basis for previously diagnosed pulmonary embolism.  Patient requested and was given additional Coumadin samples per the [facility] today.

## 2015-03-20 NOTE — H&P (Addendum)
[facility] History and Physical  [place]ast[place] Jon PYP:[id number] DOB: June 02, 1961 DOA: 03/20/2015  Referring physician:  PCP: Minerva Ends, MD   Chief Complaint: Nausea/vomiting  HPI: Alicia [name] is a 54 y.o. female with a past medical history of stage IV gastric cancer with last staging CT scan of abdomen and pelvis performed on 01/08/2015 that showed findings consistent with distal small bowel obstruction thought to be secondary to adhesions, right-sided ureteral obstruction, interval development of small amount of intra-abdominal ascites and grossly unchanged peritoneal carcinomatosis. Also noted was 1.4 cm sclerotic osseous metastatic lesion within right ilium. On 03/05/2015 she received her fourth cycle of Taxol/ Ramucirumab. She has has a history of pulmonary embolism for which she had been taking Coumadin. She presents as a direct admission from the [facility] as she has had intractable nausea and vomiting for the past 48 hours. She reports inability to hold anything by mouth down including her medications. She also complains of epigastric abdominal pain which has worsened in the past 24 hours and is requesting IV pain medication. She reports passing flatus. She denies fevers, chills, hemoptysis, hematemesis, diarrhea, chest pain, shortness of breath. She had a KUB done at the cancer center which did not reveal definite evidence of obstruction.                                                             Review of Systems:  Constitutional:  No weight loss, night sweats, Fevers, chills, fatigue.  HEENT:  No headaches, Difficulty swallowing,Tooth/dental problems,Sore throat,  No sneezing, itching, ear ache, nasal congestion, post nasal drip,  Cardio-vascular:  No chest pain, Orthopnea, PND, swelling in lower extremities, anasarca, dizziness, palpitations  GI:  No heartburn, indigestion, positive for abdominal pain, nausea, vomiting, diarrhea, change in bowel habits, loss  of appetite  Resp:  No shortness of breath with exertion or at rest. No excess mucus, no productive cough, No non-productive cough, No coughing up of blood.No change in color of mucus.No wheezing.No chest wall deformity  Skin:  no rash or lesions.  GU:  no dysuria, change in color of urine, no urgency or frequency. No flank pain.  Musculoskeletal:  No joint pain or swelling. No decreased range of motion. No back pain.  Psych:  No change in mood or affect. No depression or anxiety. No memory loss.   Past Medical History  Diagnosis Date  . 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 PE (pulmonary embolism) 05/17/2014  . Gastric cancer 06/06/2014  . Constipation 09/04/2014   Past Surgical History  Procedure Laterality Date  . Abdominal hysterectomy  10/18/2013    Social History:  reports that she has never smoked. She has never used smokeless tobacco. She reports that she does not drink alcohol or use illicit drugs.  Allergies  Allergen Reactions  . Xarelto [Rivaroxaban] Nausea And Vomiting, Palpitations and Other (See Comments)    Headaches and fatigue, numbness in hands/feet and vomitting    Family History  Problem Relation Age of Onset  . Heart disease Mother   . Diabetes Sister   . Heart disease Sister   . Heart disease Brother     Prior to Admission medications   Medication Sig Start Date End Date Taking? Authorizing Provider  [employer]um [employer] Mag  Hydroxide-Simeth (MAGIC MOUTHWASH) SOLN Take 5 mLs by mouth 4 (four) times daily as needed for mouth pain. 11/19/14   Ladell Pier, MD  amLODipine (NORVASC) 5 MG tablet Take 1 tablet (5 mg total) by mouth daily. 02/20/15   Owens Shark, NP  diphenoxylate-atropine (LOMOTIL) 2.5-0.025 MG per tablet TAKE 2 TABLETS BY MOUTH UP TO 4 TIMES DAILY AS NEEDED FOR DIARRHEA 02/25/15   Owens Shark, NP  feeding supplement, ENSURE ENLIVE, (ENSURE ENLIVE) LIQD Take 237 mLs by mouth 2 (two) times daily between meals. Patient not taking:  Reported on 03/13/2015 01/15/15   Janece Canterbury, MD  HYDROmorphone (DILAUDID) 4 MG tablet Take 1-2 tablets (4-8 mg total) by mouth every 4 (four) hours as needed for severe pain. 02/20/15   Owens Shark, NP  LORazepam (ATIVAN) 0.5 MG tablet Take 1 tablet (0.5 mg total) by mouth every 8 (eight) hours as needed for anxiety (or nausea). 01/30/15   Owens Shark, NP  ondansetron (ZOFRAN) 4 MG tablet Take 1 tablet (4 mg total) by mouth every 6 (six) hours. 07/10/14   Dorie Rank, MD  pantoprazole (PROTONIX) 40 MG tablet Take 1 tablet (40 mg total) by mouth 2 (two) times daily. 01/15/15   Janece Canterbury, MD  potassium chloride SA (K-DUR,KLOR-CON) 20 MEQ tablet Take 1 tablet (20 mEq total) by mouth daily. 02/14/15   Ladell Pier, MD  [place]    Historical Provider, MD  prochlorperazine (COMPAZINE) 10 MG tablet Take 1 tablet (10 mg total) by mouth every 6 (six) hours as needed for nausea or vomiting. 09/03/14   Susanne Borders, NP  sertraline (ZOLOFT) 100 MG tablet Take 1 tablet (100 mg total) by mouth daily. 05/17/14   Josalyn Funches, MD  warfarin (COUMADIN) 4 MG tablet Take 1-1.5 tablets (4-6 mg total) by mouth See admin instructions. 4 mg Sunday Monday Wednsday Friday 6 mg Tuesday Thursday Saturday Patient taking differently: Take 4-6 mg by mouth See admin instructions. 2mg  every Tuesday and Friday.  All other days 4mg  daily 01/15/15   Janece Canterbury, MD   Physical Exam: There were no vitals filed for this visit.  Wt Readings from Last 3 Encounters:  03/13/15 64.592 kg (142 lb 6.4 oz)  02/27/15 66.134 kg (145 lb 12.8 oz)  02/20/15 67.495 kg (148 lb 12.8 oz)    General:  Chronically ill-appearing, in no acute distress she is awake and alert oriented 3 Eyes: PERRL, normal lids, irises & conjunctiva ENT: grossly normal hearing, lips & tongue Neck: no LAD, masses or thyromegaly Cardiovascular: RRR, no m/r/g. No LE edema. Tachycardic Telemetry: SR, no arrhythmias  Respiratory:  CTA bilaterally, no w/r/r. Normal respiratory effort. Abdomen: soft, she has pain with palpation over the epigastric region, positive bowel sounds in all 4 quadrants, no rebound tenderness or guarding Skin: no rash or induration seen on limited exam Musculoskeletal: grossly normal tone BUE/BLE Psychiatric: grossly normal mood and affect, speech fluent and appropriate Neurologic: grossly non-focal.          Labs on Admission:  Basic Metabolic Panel:  Recent Labs Lab 03/20/15 1130  NA 137  K 3.5  CO2 25  GLUCOSE 148*  BUN 6.5*  CREATININE 0.6  CALCIUM 8.8   Liver Function Tests:  Recent Labs Lab 03/20/15 1130  AST 13  ALT 7  ALKPHOS 117  BILITOT 0.59  PROT 6.1*  ALBUMIN 2.9*   No results for input(s): LIPASE, AMYLASE in the last 168 hours. No results for  input(s): AMMONIA in the last 168 hours. CBC:  Recent Labs Lab 03/20/15 0955  WBC 7.2  NEUTROABS 5.0  HGB 8.8*  HCT 27.9*  MCV 89.7  PLT 439*   Cardiac Enzymes: No results for input(s): CKTOTAL, CKMB, CKMBINDEX, TROPONINI in the last 168 hours.  BNP (last 3 results) No results for input(s): BNP in the last 8760 hours.  ProBNP (last 3 results) No results for input(s): PROBNP in the last 8760 hours.  CBG: No results for input(s): GLUCAP in the last 168 hours.  Radiological Exams on Admission: Dg Abd 1 View  03/20/2015   CLINICAL DATA:  Mid abdominal pain and constipation for 1 week. Patient complains of vomiting 9 times in the last 24 hr. History of gastric cancer with the obstruction.  EXAM: ABDOMEN - 1 VIEW  COMPARISON:  01/15/2015  FINDINGS: The tip of central venous catheter is seen at cavoatrial junction. Lung bases are clear.  The bowel gas pattern is nonspecific/nonobstructive. There is sub pathologic gaseous distension of small and large bowel loops. No radio-opaque calculi. Again seen is ill-defined sclerotic lesion within the right iliac crest. Surgical clips in the pelvis are noted.  IMPRESSION:  Nonspecific bowel gas pattern, without definite evidence of obstruction.  Sclerotic lesion within the right ilium. The concern for bony metastases persists.   Electronically Signed   By: Fidela Salisbury M.D.   On: 03/20/2015 12:38    EKG: Independently reviewed.   Assessment/Plan Principal Problem:   Nausea with vomiting Active Problems:   Gastric cancer   PE (pulmonary embolism)   Anticoagulated on Coumadin   Abdominal pain   Nausea & vomiting   1. Intractable nausea and vomiting. Patient with history of stage IV gastric cancer presented as a transfer from the [facility] as she presents with intractable nausea and vomiting. Previous CT scan of abdomen and pelvis performed on 01/08/2015 showed findings suggestive of distal small bowel obstruction presumably due to adhesions. Apart from bowel obstruction, symptoms could also potentially be due to progression of malignancy or even related to recent chemotherapy. Will further workup with CT scan of abdomen and pelvis. Keep patient nothing by mouth, provide IV fluids, as needed IV antiemetic therapy. 2. Stage IV gastric cancer. Patient currently receiving her care at the [facility], receiving her fourth cycle of Taxol/ Ramucirumab on 03/13/2015. She presents with complaints of worsening abdominal pain associate with intractable nausea and vomiting. It is possible there may be progression of disease, pending further workup with CT scan of abdomen and pelvis.Await further recommendations from Heme/Onc.  3. History of pulmonary embolism. Patient on anticoagulation with warfarin. Labs showing INR of 2.2 with PTT of 26.4. Will consult pharmacy for Coumadin management. Unclear he should be able tolerate by mouth today. May need to transition to IV heparin. Will reassess in a.m. 4. Anemia of chronic disease. Patient with history of metastatic gastric cancer. Suspect chemotherapy also contributed to anemia. Hemoglobin remained stable at 8.8. He  denies GI bleed. 5. History of hypertension. Blood pressure stable on admission, will provide as needed IV labetalol for systolic blood pressures greater than 160. 6. DVT prophylaxis. Patient anticoagulated.  Code Status: Full code Family Communication: Spoke with her husband was present at bedside Disposition Plan: Will admit patient to the patient's service comments his facial require greater than 2 nights hospitalization  Time spent: 70 min  Kelvin Cellar [facility] Pager [phone]

## 2015-03-20 NOTE — Assessment & Plan Note (Signed)
Patient states that she has been suffering with some chronic nausea and intermittent vomiting since initiation of her chemotherapy.  However, patient has had increased nausea and vomiting a total of 9 times since last night; despite taking her antinausea medications as directed.  Patient has a history of obstruction in the past.  Patient appears dehydrated today.  She is afebrile.  She does appear fatigued, weak, and dehydrated today.  KUB obtained today revealed no obstruction.  Patient received Zofran 8 mg IV and IV fluid rehydration while at the cancer center.  Patient initially stated that she was feeling somewhat better; and requested to take sips of soup.  However, patient began vomiting/retching once again when she tried to take in fluids.  Decision was made for patient to be direct admitted to the hospital for intractable nausea/vomiting and dehydration.  Of note- patient's systolic blood pressure dropped to the mid 90s just prior to her transfer to the hospital- most likely, secondary to fairly severe dehydration.  Brief history and report were called to Dr. Coralyn Pear hospitalist prior to transferring the patient to room #1323 via wheelchair per the [place].

## 2015-03-20 NOTE — Assessment & Plan Note (Deleted)
Patient continues to take Coumadin on a regular basis for previously diagnosed pulmonary embolism.  Patient requested and was given additional Coumadin samples per the [place] today.

## 2015-03-20 NOTE — Progress Notes (Signed)
ANTICOAGULATION CONSULT NOTE - Initial Consult  Pharmacy Consult for warfarin Indication: pulmonary embolus  Allergies  Allergen Reactions  . Xarelto [Rivaroxaban] Nausea And Vomiting, Palpitations and Other (See Comments)    Headaches and fatigue, numbness in hands/feet and vomitting    Patient Measurements:    Vital Signs: Temp: 98.7 F (37.1 C) (08/04 1049) Temp Source: Oral (08/04 1049) BP: 138/96 mmHg (08/04 1049) Pulse Rate: 96 (08/04 1049)  Labs:  Recent Labs  03/20/15 0955 03/20/15 0955 03/20/15 1130  HGB  --  8.8*  --   HCT  --  27.9*  --   PLT  --  439*  --   INR 2.20  --   --   CREATININE  --   --  0.6    Estimated Creatinine Clearance: 66.6 mL/min (by C-G formula based on Cr of 0.6).   Medical History: Past Medical History  Diagnosis Date  . 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 PE (pulmonary embolism) 05/17/2014  . Gastric cancer 06/06/2014  . Constipation 09/04/2014    Medications:  Scheduled:  . [START ON 03/21/2015] amLODipine  5 mg Oral Daily  . sodium chloride  3 mL Intravenous Q12H   Infusions:  . sodium chloride      Assessment: 54 yo with stage IV gastric cancer was at [facility] to get day 8 of Taxol but had intractable N/V so was sent to be admitted. Patient on warfarin PTA at a dose of 4mg  daily except 2mg  on Tu/Fri for hx PE to continue warfarin while inpatient if patient can keep medication down. INR therapeutic on admission  Goal of Therapy:  INR 2-3   Plan:  1) Warfarin 4mg  today per home regimen 2) Daily INR   Adrian Saran, PharmD, BCPS Pager [phone] 03/20/2015 2:59 PM

## 2015-03-20 NOTE — Assessment & Plan Note (Signed)
Chemotherapy-induced anemia noted today with hemoglobin 8.8.  Patient denies any worsening issues with either shortness of breath or easy fatigue.  We'll continue to monitor closely.

## 2015-03-21 DIAGNOSIS — N1330 Unspecified hydronephrosis: Secondary | ICD-10-CM

## 2015-03-21 DIAGNOSIS — R112 Nausea with vomiting, unspecified: Secondary | ICD-10-CM

## 2015-03-21 DIAGNOSIS — C169 Malignant neoplasm of stomach, unspecified: Secondary | ICD-10-CM

## 2015-03-21 DIAGNOSIS — R030 Elevated blood-pressure reading, without diagnosis of hypertension: Secondary | ICD-10-CM

## 2015-03-21 DIAGNOSIS — K5660 Unspecified intestinal obstruction: Principal | ICD-10-CM

## 2015-03-21 DIAGNOSIS — D6481 Anemia due to antineoplastic chemotherapy: Secondary | ICD-10-CM

## 2015-03-21 LAB — PREPARE RBC (CROSSMATCH)

## 2015-03-21 LAB — COMPREHENSIVE METABOLIC PANEL
ALBUMIN: 2.6 g/dL — AB (ref 3.5–5.0)
ALK PHOS: 96 U/L (ref 38–126)
ALT: 8 U/L — ABNORMAL LOW (ref 14–54)
AST: 13 U/L — ABNORMAL LOW (ref 15–41)
Anion gap: 6 (ref 5–15)
CHLORIDE: 105 mmol/L (ref 101–111)
CO2: 26 mmol/L (ref 22–32)
Calcium: 8 mg/dL — ABNORMAL LOW (ref 8.9–10.3)
Creatinine, Ser: 0.56 mg/dL (ref 0.44–1.00)
GFR calc Af Amer: >60 mL/min (ref >60)
GFR calc non Af Amer: >60 mL/min (ref >60)
GLUCOSE: 86 mg/dL (ref 65–99)
POTASSIUM: 3.5 mmol/L (ref 3.5–5.1)
Sodium: 137 mmol/L (ref 135–145)
Total Bilirubin: 0.6 mg/dL (ref 0.3–1.2)
Total Protein: 5.3 g/dL — ABNORMAL LOW (ref 6.5–8.1)

## 2015-03-21 LAB — ABO/RH: ABO/RH(D): O POS

## 2015-03-21 LAB — CBC
HCT: 21.7 % — ABNORMAL LOW (ref 36.0–46.0)
Hemoglobin: 6.6 g/dL — CL (ref 12.0–15.0)
MCH: 27.8 pg (ref 26.0–34.0)
MCHC: 30.4 g/dL (ref 30.0–36.0)
MCV: 91.6 fL (ref 78.0–100.0)
Platelets: 290 10*3/uL (ref 150–400)
RBC: 2.37 MIL/uL — AB (ref 3.87–5.11)
RDW: 20.8 % — AB (ref 11.5–15.5)
WBC: 4.9 10*3/uL (ref 4.0–10.5)

## 2015-03-21 LAB — PROTIME-INR
INR: 2.42 — ABNORMAL HIGH (ref 0.00–1.49)
Prothrombin Time: 26 seconds — ABNORMAL HIGH (ref 11.6–15.2)

## 2015-03-21 MED ORDER — SODIUM CHLORIDE 0.9 % IV SOLN
Freq: Once | INTRAVENOUS | Status: AC
  Administered 2015-03-21: 15:00:00 via INTRAVENOUS

## 2015-03-21 MED ORDER — CETYLPYRIDINIUM CHLORIDE 0.05 % MT LIQD
7.0000 mL | Freq: Two times a day (BID) | OROMUCOSAL | Status: DC
  Administered 2015-03-21 – 2015-03-24 (×8): 7 mL via OROMUCOSAL

## 2015-03-21 MED ORDER — CHLORHEXIDINE GLUCONATE 0.12% ORAL RINSE (MEDLINE KIT)
15.0000 mL | Freq: Two times a day (BID) | OROMUCOSAL | Status: DC
  Administered 2015-03-22 – 2015-03-24 (×3): 15 mL via OROMUCOSAL

## 2015-03-21 MED ORDER — SODIUM CHLORIDE 0.9 % IV SOLN
Freq: Once | INTRAVENOUS | Status: AC
  Administered 2015-03-21: 11:00:00 via INTRAVENOUS

## 2015-03-21 NOTE — Progress Notes (Signed)
[facility] PROGRESS NOTE  Alicia [name] XBL:[id number] DOB: Jun 23, 1961 DOA: 03/20/2015 PCP: Minerva Ends, MD  Assessment/Plan: 1. Nausea/vomiting -Case was discussed with Dr. Benay Spice of medical oncology, we believe that this likely secondary to mid to distal small bowel obstruction which could possibly be caused by a peritoneal nodule.  -Will attempt conservative management keeping nothing by mouth status and IV fluids for the next 24 hours and reassess -If there is no improvement will obtain surgical evaluation -She reported having 3 episodes of nausea vomiting overnight, although, thinks she might be feeling a little better this morning. -Continue as needed IV anti-emetic therapy  2.  Right hydroureteronephrosis -CT scan done on admission did reveal the presence of an increase right hydroureteronephrosis with a transition point in the right pelvis next to the right iliac surgical clips. The  she may have a ureteral stricture. Radiology did not report obstructing mass or calculus. -Her kidney function remains stable, with a.m. lab work showing creatinine of 0.56. -Case was discussed with Dr Matilde Sprang of urology. He stated that in this case with a patient having history of metastatic gastric cancer presenting with asymptomatic right hydronephrosis with normal creatinine, stenting would not be recommended.   -Will continue to monitor.   3.  Acute anemia -Labs showing a downward trend in hemoglobin from 8.8 on admission to 6.6 this morning -I think this likely resulting from slow, chronic blood loss from malignancy. She is also on anticoagulation for history of pulmonary embolism. -She was typed and cross and will transfuse a total of 2 units packed red blood cells today.  4.  Stage IV gastric cancer.  -She is currently receiving care at the [facility], undergoing treatment with Taxol/ Ramucirumab last treatment on 03/13/2015 -CT scan of abdomen and pelvis showing stable  annular constricting mass around gastric body, presence of peritoneal metastatic disease, increased mesenteric soft tissue nodules, mild increase in sclerotic bone metastasis and spine and pelvis. -Dr. Benay Spice of oncology following  5.  History of pulmonary embolism -INR remains therapeutic at 2.42 with PTT of 26. -Pharmacy following.  Code Status: Full code Family Communication:  Disposition Plan: Continue medical management, reassess in a.m. as she may require surgical consultation if no improvement   Consultants:  Oncology   HPI/Subjective: Patient is a pleasant 54 year old female with a past medical history of stage IV gastric cancer, admitted to the medicine service on 03/21/2015 when she presented with multiple episodes of nausea and vomiting unable to hold on by mouth including medications. She also complained of worsening abdominal pain. She was further worked up with a CT scan of abdomen and pelvis that showed a stable annular constricting mass involving the gastric body consistent with primary gastric carcinoma. Radiology reported presence of a mid to distal small bowel obstruction. She was placed on bowel rest, made nothing by mouth given IV fluids and IV narcotic analgesics for pain management. She was seen and evaluated by Dr Benay Spice of Oncology.    Objective: Filed Vitals:   03/21/15 0532  BP: 109/65  Pulse: 74  Temp: 98.4 F (36.9 C)  Resp: 18    Intake/Output Summary (Last 24 hours) at 03/21/15 0843 Last data filed at 03/21/15 0550  Gross per 24 hour  Intake   1518 ml  Output    600 ml  Net    918 ml   Filed Weights   03/20/15 1400 03/20/15 1600  Weight: 64.275 kg (141 lb 11.2 oz) 64.275 kg (141 lb 11.2 oz)  Exam:   General:  Patient appears stable, no acute distress, states not vomiting this morning.  Cardiovascular: regular rate and rhythm normal S1-S2 no murmurs rubs or gallops  Respiratory: normal respiratory effort  Abdomen: improvement to  abdominal exam overall soft having mild generalized tenderness, palpable mass over periumbilical region  Musculoskeletal: no edema  Data Reviewed: Basic Metabolic Panel:  Recent Labs Lab 03/20/15 1130 03/21/15 0510  NA 137 137  K 3.5 3.5  CL  --  105  CO2 25 26  GLUCOSE 148* 86  BUN 6.5* <5*  CREATININE 0.6 0.56  CALCIUM 8.8 8.0*   Liver Function Tests:  Recent Labs Lab 03/20/15 1130 03/21/15 0510  AST 13 13*  ALT 7 8*  ALKPHOS 117 96  BILITOT 0.59 0.6  PROT 6.1* 5.3*  ALBUMIN 2.9* 2.6*   No results for input(s): LIPASE, AMYLASE in the last 168 hours. No results for input(s): AMMONIA in the last 168 hours. CBC:  Recent Labs Lab 03/20/15 0955 03/21/15 0510  WBC 7.2 4.9  NEUTROABS 5.0  --   HGB 8.8* 6.6*  HCT 27.9* 21.7*  MCV 89.7 91.6  PLT 439* 290   Cardiac Enzymes: No results for input(s): CKTOTAL, CKMB, CKMBINDEX, TROPONINI in the last 168 hours. BNP (last 3 results) No results for input(s): BNP in the last 8760 hours.  ProBNP (last 3 results) No results for input(s): PROBNP in the last 8760 hours.  CBG: No results for input(s): GLUCAP in the last 168 hours.  No results found for this or any previous visit (from the past 240 hour(s)).   Studies: Dg Abd 1 View  03/20/2015   CLINICAL DATA:  Mid abdominal pain and constipation for 1 week. Patient complains of vomiting 9 times in the last 24 hr. History of gastric cancer with the obstruction.  EXAM: ABDOMEN - 1 VIEW  COMPARISON:  01/15/2015  FINDINGS: The tip of central venous catheter is seen at cavoatrial junction. Lung bases are clear.  The bowel gas pattern is nonspecific/nonobstructive. There is sub pathologic gaseous distension of small and large bowel loops. No radio-opaque calculi. Again seen is ill-defined sclerotic lesion within the right iliac crest. Surgical clips in the pelvis are noted.  IMPRESSION: Nonspecific bowel gas pattern, without definite evidence of obstruction.  Sclerotic lesion  within the right ilium. The concern for bony metastases persists.   Electronically Signed   By: Fidela Salisbury M.D.   On: 03/20/2015 12:38   Ct Abdomen Pelvis W Contrast  03/20/2015   CLINICAL DATA:  Stage IV gastric carcinoma. Intractable nausea and vomiting. Abdominal pain.  EXAM: CT ABDOMEN AND PELVIS WITH CONTRAST  TECHNIQUE: Multidetector CT imaging of the abdomen and pelvis was performed using the standard protocol following bolus administration of intravenous contrast.  CONTRAST:  173mL OMNIPAQUE IOHEXOL 300 MG/ML  SOLN  COMPARISON:  01/08/2015  FINDINGS: Lower Chest: No acute findings.  Hepatobiliary: No liver masses visualized. Mild perihepatic ascites and periportal edema are stable in appearance. Gallbladder is unremarkable.  Pancreas: No mass, inflammatory changes, or other significant abnormality identified.  Spleen:  Within normal limits in size and appearance.  Adrenals:  No masses identified.  Kidneys/Urinary Tract: Moderate right hydronephrosis and ureterectasis is increased since previous study. There is a transition point to nondilated distal right ureter near surgical clips adjacent to the right iliac vessels, suspicious for a ureteral stricture. No obstructing mass or calculi visualized.  Stomach/Bowel/Peritoneum: Annular constricting mass is again seen involving the body the stomach which shows no  significant change, consistent with primary gastric carcinoma. Adjacent soft tissue nodularity in the gastrocolic ligament remains stable, consistent with peritoneal metastatic disease. An irregular soft tissue nodule in the end anterior abdominal wall soft tissues just above the umbilicus measuring 2.5 cm is stable and suspicious for neoplasm. Several mesenteric peritoneal nodules are seen in the right lower quadrant which are increased in size. Largest on image 55 measures 19 mm, compared with 11 mm previously. This is consistent with peritoneal metastatic disease. Mild ascites shows mild  decrease since previous study.  Moderately dilated small bowel loops are again seen with a transition point in the right lower quadrant mesentery at the site of the peritoneal nodule described above. This is consistent with a mid to distal small bowel obstruction.  Vascular/Lymphatic: No pathologically enlarged lymph nodes identified. No abdominal aortic aneurysm or other significant retroperitoneal abnormality demonstrated.  Reproductive: Prior hysterectomy noted. Adnexal regions are unremarkable in appearance.  Other:  None.  Musculoskeletal: Several small sclerotic bone lesions are seen in the spine and pelvis, a few which are new or mildly increased in size since previous study, consistent with sclerotic bone metastases.  IMPRESSION: Stable annular constricting mass involving the gastric body, consistent with primary gastric carcinoma. Stable adjacent soft tissue nodularity in gastrocolic ligament and anterior abdominal wall soft tissues, consistent with peritoneal metastatic disease.  Increased mesenteric soft tissue nodules in the right lower quadrant, also consistent with peritoneal metastatic disease. This results and a mid to distal small bowel obstruction.  Mild ascites has decreased since previous study.  Mild increase in sclerotic bone metastases in the spine and pelvis.  Increased right hydroureteronephrosis with transition point in the right pelvis adjacent to right iliac surgical clips. This suggests possibility of ureteral stricture. No obstructing calculus or mass visualized.   Electronically Signed   By: Earle Gell M.D.   On: 03/20/2015 18:24    Scheduled Meds: . sodium chloride   Intravenous Once  . sodium chloride   Intravenous Once  . amLODipine  5 mg Oral Daily  . antiseptic oral rinse  7 mL Mouth Rinse q12n4p  . chlorhexidine gluconate  15 mL Mouth Rinse BID  . sodium chloride  3 mL Intravenous Q12H  . Warfarin - Pharmacist Dosing Inpatient   Does not apply q1800   Continuous  Infusions: . sodium chloride 1,000 mL (03/21/15 0129)    Principal Problem:   Nausea with vomiting Active Problems:   Gastric cancer   PE (pulmonary embolism)   Anticoagulated on Coumadin   Abdominal pain   Nausea & vomiting    Time spent: 35 min    Kelvin Cellar  [facility] Pager [phone]. If 7PM-7AM, please contact night-coverage at [email or web address], password [facility] 03/21/2015, 8:43 AM  LOS: 1 day

## 2015-03-21 NOTE — Plan of Care (Signed)
Problem: Phase I Progression Outcomes Goal: Other Phase I Outcomes/Goals Outcome: Progressing Patient with episode of vomiting

## 2015-03-21 NOTE — Plan of Care (Signed)
Problem: Phase I Progression Outcomes Goal: Pain controlled with appropriate interventions Outcome: Progressing Pain medications decreases pain per patient

## 2015-03-21 NOTE — Progress Notes (Signed)
ANTICOAGULATION CONSULT NOTE - Follow Up  Pharmacy Consult for warfarin Indication: pulmonary embolus  Allergies  Allergen Reactions  . Xarelto [Rivaroxaban] Nausea And Vomiting, Palpitations and Other (See Comments)    Headaches and fatigue, numbness in hands/feet and vomitting    Patient Measurements: Height: 5\' 2"  (157.5 cm) Weight: 141 lb 11.2 oz (64.275 kg) IBW/kg (Calculated) : 50.1  Vital Signs: Temp: 98.4 F (36.9 C) (08/05 0532) Temp Source: Oral (08/05 0532) BP: 109/65 mmHg (08/05 0532) Pulse Rate: 74 (08/05 0532)  Labs:  Recent Labs  03/20/15 0955 03/20/15 0955 03/20/15 1130 03/21/15 0510  HGB  --  8.8*  --  6.6*  HCT  --  27.9*  --  21.7*  PLT  --  439*  --  290  LABPROT  --   --   --  26.0*  INR 2.20  --   --  2.42*  CREATININE  --   --  0.6 0.56    Estimated Creatinine Clearance: 70 mL/min (by C-G formula based on Cr of 0.56).   Medical History: Past Medical History  Diagnosis Date  . 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 PE (pulmonary embolism) 05/17/2014  . Gastric cancer 06/06/2014  . Constipation 09/04/2014    Medications:  Scheduled:  . sodium chloride   Intravenous Once  . sodium chloride   Intravenous Once  . amLODipine  5 mg Oral Daily  . antiseptic oral rinse  7 mL Mouth Rinse q12n4p  . chlorhexidine gluconate  15 mL Mouth Rinse BID  . sodium chloride  3 mL Intravenous Q12H  . Warfarin - Pharmacist Dosing Inpatient   Does not apply q1800   Infusions:  . sodium chloride 1,000 mL (03/21/15 0129)    Assessment: 54 yo with stage IV gastric cancer was at [facility] to get day 8 of Taxol but had intractable N/V so was sent to be admitted. Patient on warfarin PTA at a dose of 4mg  daily except 2mg  on Tu/Fri for hx PE to continue warfarin while inpatient if patient can keep medication down. INR therapeutic on admission.   INR therapeutic (2.42), increased from yesterday  Hgb decreased (6.6), plan transfuse 2 units PRBC  today  Diet currently NPO, therefore, no vitamin K intake  No drug interactions noted  Goal of Therapy:  INR 2-3   Plan:   No warfarin today per discussion with MD  Daily PT/INR  Peggyann Juba, PharmD, BCPS Pager: [phone]  03/21/2015 9:45 AM

## 2015-03-21 NOTE — Progress Notes (Signed)
Paged Kathline Magic NP about HGB 6.6 this am. Gershon Mussel came to see patient and placed orders.

## 2015-03-21 NOTE — Plan of Care (Signed)
Problem: Phase I Progression Outcomes Goal: OOB as tolerated unless otherwise ordered Outcome: Completed/Met Date Met:  03/21/15 Patient has been oob in room on own.

## 2015-03-21 NOTE — Progress Notes (Signed)
IP PROGRESS NOTE  Subjective:   She vomited several times last night. No emesis this morning.  Objective: Vital signs in last 24 hours: Blood pressure 128/74, pulse 69, temperature 98.6 F (37 C), temperature source Oral, resp. rate 18, height 5\' 2"  (1.575 m), weight 141 lb 11.2 oz (64.275 kg), SpO2 99 %.  Intake/Output from previous day: 08/04 0701 - 08/05 0700 In: 1518 [P.O.:20; I.V.:1498] Out: 600 [Urine:550; Emesis/NG output:50]  Physical Exam:   Abdomen: Soft, periumbilical mass  Portacath/PICC-without erythema  Lab Results:  Recent Labs  03/20/15 0955 03/21/15 0510  WBC 7.2 4.9  HGB 8.8* 6.6*  HCT 27.9* 21.7*  PLT 439* 290    BMET  Recent Labs  03/20/15 1130 03/21/15 0510  NA 137 137  K 3.5 3.5  CL  --  105  CO2 25 26  GLUCOSE 148* 86  BUN 6.5* <5*  CREATININE 0.6 0.56  CALCIUM 8.8 8.0*    Studies/Results: Dg Abd 1 View  03/20/2015   CLINICAL DATA:  Mid abdominal pain and constipation for 1 week. Patient complains of vomiting 9 times in the last 24 hr. History of gastric cancer with the obstruction.  EXAM: ABDOMEN - 1 VIEW  COMPARISON:  01/15/2015  FINDINGS: The tip of central venous catheter is seen at cavoatrial junction. Lung bases are clear.  The bowel gas pattern is nonspecific/nonobstructive. There is sub pathologic gaseous distension of small and large bowel loops. No radio-opaque calculi. Again seen is ill-defined sclerotic lesion within the right iliac crest. Surgical clips in the pelvis are noted.  IMPRESSION: Nonspecific bowel gas pattern, without definite evidence of obstruction.  Sclerotic lesion within the right ilium. The concern for bony metastases persists.   Electronically Signed   By: Fidela Salisbury M.D.   On: 03/20/2015 12:38   Ct Abdomen Pelvis W Contrast  03/20/2015   CLINICAL DATA:  Stage IV gastric carcinoma. Intractable nausea and vomiting. Abdominal pain.  EXAM: CT ABDOMEN AND PELVIS WITH CONTRAST  TECHNIQUE: Multidetector CT  imaging of the abdomen and pelvis was performed using the standard protocol following bolus administration of intravenous contrast.  CONTRAST:  139mL OMNIPAQUE IOHEXOL 300 MG/ML  SOLN  COMPARISON:  01/08/2015  FINDINGS: Lower Chest: No acute findings.  Hepatobiliary: No liver masses visualized. Mild perihepatic ascites and periportal edema are stable in appearance. Gallbladder is unremarkable.  Pancreas: No mass, inflammatory changes, or other significant abnormality identified.  Spleen:  Within normal limits in size and appearance.  Adrenals:  No masses identified.  Kidneys/Urinary Tract: Moderate right hydronephrosis and ureterectasis is increased since previous study. There is a transition point to nondilated distal right ureter near surgical clips adjacent to the right iliac vessels, suspicious for a ureteral stricture. No obstructing mass or calculi visualized.  Stomach/Bowel/Peritoneum: Annular constricting mass is again seen involving the body the stomach which shows no significant change, consistent with primary gastric carcinoma. Adjacent soft tissue nodularity in the gastrocolic ligament remains stable, consistent with peritoneal metastatic disease. An irregular soft tissue nodule in the end anterior abdominal wall soft tissues just above the umbilicus measuring 2.5 cm is stable and suspicious for neoplasm. Several mesenteric peritoneal nodules are seen in the right lower quadrant which are increased in size. Largest on image 55 measures 19 mm, compared with 11 mm previously. This is consistent with peritoneal metastatic disease. Mild ascites shows mild decrease since previous study.  Moderately dilated small bowel loops are again seen with a transition point in the right lower quadrant mesentery at  the site of the peritoneal nodule described above. This is consistent with a mid to distal small bowel obstruction.  Vascular/Lymphatic: No pathologically enlarged lymph nodes identified. No abdominal aortic  aneurysm or other significant retroperitoneal abnormality demonstrated.  Reproductive: Prior hysterectomy noted. Adnexal regions are unremarkable in appearance.  Other:  None.  Musculoskeletal: Several small sclerotic bone lesions are seen in the spine and pelvis, a few which are new or mildly increased in size since previous study, consistent with sclerotic bone metastases.  IMPRESSION: Stable annular constricting mass involving the gastric body, consistent with primary gastric carcinoma. Stable adjacent soft tissue nodularity in gastrocolic ligament and anterior abdominal wall soft tissues, consistent with peritoneal metastatic disease.  Increased mesenteric soft tissue nodules in the right lower quadrant, also consistent with peritoneal metastatic disease. This results and a mid to distal small bowel obstruction.  Mild ascites has decreased since previous study.  Mild increase in sclerotic bone metastases in the spine and pelvis.  Increased right hydroureteronephrosis with transition point in the right pelvis adjacent to right iliac surgical clips. This suggests possibility of ureteral stricture. No obstructing calculus or mass visualized.   Electronically Signed   By: Earle Gell M.D.   On: 03/20/2015 18:24    Medications: I have reviewed the patient's current medications.  Assessment/Plan:  1. Stage IV gastric cancer  Status post bilateral oophorectomy, total abdominal hysterectomy, omentectomy, resection of abdominal wall and pelvic masses, bilateral pelvic lymphadenectomy 10/18/2013 with the pathology confirming moderate to poorly differentiated adenocarcinoma  Stomach biopsy 11/28/2013 revealed adenocarcinoma  Status post 12 cycles of FOLFOX, last given 04/25/2014  Restaging CT 04/29/2014 with thickening of the stomach and nodular densities adjacent to the greater curvature-unchanged, faint groundglass nodularity in the lungs-nonspecific  Restaging CT 07/22/2014 with indeterminate lung  nodules; moderate wall thickening in the body region of the stomach; fairly extensive omental disease and findings suspicious for abdominal wall and incisional disease.  Started systemic chemotherapy with FOLFIRI on 07/30/14.  Restaging CT evaluation 10/14/2014 following 5 cycles of FOLFIRI showed decreased nodularity in both lungs; overall stable omental nodularity; involution of the previously demonstrated ill-defined soft tissue mass involving the anterior abdominal wall incision.  Cycle 6 FOLFIRI 10/15/2014  Cycle 7 FOLFIRI 10/29/2014  Cycle 8 FOLFIRI 11/12/2014  Cycle 9 FOLFIRI 11/26/2014 (dose reduction of 5-fluorouracil due to mucositis)  CT abdomen/pelvis 12/05/2014 revealed mild progression of peritoneal/omental tumor  Initiation of weekly Taxol (3 weeks on/1 week off) and every 2 week Ramucirumab 12/17/2014  CT abdomen/pelvis 01/08/2015 with findings worrisome for distal small bowel obstruction presumed secondary to adhesions. Apparent right-sided ureteral obstruction possibly secondary to developing adhesions; interval development of a small amount of intra-abdominal ascites. Grossly unchanged peritoneal carcinomatosis. Unchanged 1.4 cm sclerotic osseous metastasis within the right ilium.  Cycle 2 Taxol/Ramucirumab beginning 01/16/2015  Cycle 3 Taxol/ Ramucirumab beginning 02/13/2015  Cycle 4 Taxol/ Ramucirumab beginning 03/13/2015   2. Oxaliplatin neuropathy   3. Bilateral upper and lower lobe pulmonary emboli within segmental branches-now maintained on Coumadin. Followed by Coumadin clinic at the [facility].   4. Neutropenia related to chemotherapy 08/13/2014. Cycle 2 FOLFIRI held. Neulasta added beginning with cycle 2 on 08/20/2014.   5. Nausea following chemotherapy-the anti-emetic regimen was adjusted with cycle 4 FOLFIRI 09/17/2014   6. Diarrhea following FOLFIRI-no improvement with Imodium. Lomotil initiated 10/15/2014.    7. Mouth sores  following cycle 8 FOLFIRI. 5 fluorouracil dose reduced.   8. Admission 12/05/2014 with nausea/vomiting and diarrhea-likely secondary to FOLFIRI chemotherapy  9. Abdominal pain secondary to carcinomatosis and gastric tumor   10. 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 Admission 01/09/2015 with abdominal pain and nausea/vomiting-a CT confirmed a distal small bowel obstruction. Resolved with bowel rest.   12. Depression. She is currently on Zoloft 100 mg daily.   13. Elevated blood pressure. Currently on Norvasc 5 mg daily.     14. Admission 03/20/2015 with nausea/vomiting-CT consistent with a small bowel obstruction, likely related to peritoneal tumor   Mrs. Kats is admitted with a small bowel obstruction in the setting of carcinomatosis from gastric cancer. She is completing a fourth cycle of systemic therapy with Taxol and Ramucirumab. I suspect the bowel obstruction is related to progressive carcinomatosis as opposed to adhesions. She has increased right hydronephrosis.  We discussed the CT findings with her. The plan is to continue bowel rest and hope the obstructive symptoms improve over the next few days. If not we will need to consider a surgical evaluation or placement of a palliative gastrostomy tube  Recommendations: 1. Bowel rest, IV fluids 2. Surgical consultation if the obstructive symptoms do not improve over the next few days 3. I will discuss Hospice care with her if she does not improve over the next few days  I appreciate the care from Dr. Coralyn Pear   LOS: 1 day   [facility], [place]  03/21/2015, 1:01 PM

## 2015-03-21 NOTE — Progress Notes (Signed)
Initial Nutrition Assessment  DOCUMENTATION CODES:   Not applicable  INTERVENTION:  - Will monitor for needs on follow-up dependent on ability to advance diet versus need for nutrition support and GOC  NUTRITION DIAGNOSIS:   Inadequate oral intake related to inability to eat as evidenced by NPO status.  GOAL:   Patient will meet greater than or equal to 90% of their needs  MONITOR:   Diet advancement, Weight trends, Labs, I & O's  REASON FOR ASSESSMENT:   Malnutrition Screening Tool  ASSESSMENT:   54 y.o. female with a past medical history of stage IV gastric cancer with last staging CT scan of abdomen and pelvis performed on 01/08/2015 that showed findings consistent with distal small bowel obstruction thought to be secondary to adhesions, right-sided ureteral obstruction, interval development of small amount of intra-abdominal ascites and grossly unchanged peritoneal carcinomatosis. Also noted was 1.4 cm sclerotic osseous metastatic lesion within right ilium. On 03/05/2015 she received her fourth cycle of Taxol/ Ramucirumab. She has has a history of pulmonary embolism for which she had been taking Coumadin. She presents as a direct admission from the [facility] as she has had intractable nausea and vomiting for the past 48 hours. She reports inability to hold anything by mouth down including her medications. She also complains of epigastric abdominal pain which has worsened in the past 24 hours and is requesting IV pain medication.   Pt seen for MST. BMI indicates overweight status. Pt sleeping at time of visit and visitor laying on in-room window seat. RN note indicates that pt needs Patent attorney. Asked visitor if he was a family member and he stated "no," but suspect he did not understand the question. No interpretor phone in the room. Did not feel it was necessary to awake pt at this time.  Pt was was seen by [place] RD 02/27/15. During this visit, RD noted that "pt  has had progressive weight loss." It was indicated that pt lost 50 lbs (25.5% body weight) from October 2015- February 27, 2015 (8 months); wt loss significant for time frame. RD provided pt with samples of strawberry Ensure Enlive and [employer]entials at time of visit last month.  Suspect some degree of malnutrition present but unable to confirm as physical assessment unable to be completed. Unable to meet needs. Will monitor for plan of care/GOC. Medications reviewed. Labs reviewed; BUN <5, Ca: 8 mg/dL.  Diet Order:  Diet NPO time specified  Skin:  Reviewed, no issues  Last BM:  8/2  Height:   Ht Readings from Last 1 Encounters:  03/20/15 5\' 2"  (1.575 m)    Weight:   Wt Readings from Last 1 Encounters:  03/20/15 141 lb 11.2 oz (64.275 kg)    Ideal Body Weight:  50 kg (kg)  BMI:  Body mass index is 25.91 kg/(m^2).  Estimated Nutritional Needs:   Kcal:  7989-2119  Protein:  78-90 grams  Fluid:  2-2.2 L/day  EDUCATION NEEDS:   No education needs identified at this time     Jarome Matin, RD, LDN Inpatient Clinical Dietitian Pager # [phone] After hours/weekend pager # [phone]

## 2015-03-22 LAB — BASIC METABOLIC PANEL
Anion gap: 7 (ref 5–15)
BUN: <5 mg/dL — ABNORMAL LOW (ref 6–20)
CALCIUM: 8.1 mg/dL — AB (ref 8.9–10.3)
CHLORIDE: 107 mmol/L (ref 101–111)
CO2: 25 mmol/L (ref 22–32)
Creatinine, Ser: 0.53 mg/dL (ref 0.44–1.00)
GFR calc Af Amer: >60 mL/min (ref >60)
GFR calc non Af Amer: >60 mL/min (ref >60)
GLUCOSE: 74 mg/dL (ref 65–99)
POTASSIUM: 3.2 mmol/L — AB (ref 3.5–5.1)
Sodium: 139 mmol/L (ref 135–145)

## 2015-03-22 LAB — PROTIME-INR
INR: 2.4 — AB (ref 0.00–1.49)
Prothrombin Time: 25.9 seconds — ABNORMAL HIGH (ref 11.6–15.2)

## 2015-03-22 LAB — CBC
HCT: 30.6 % — ABNORMAL LOW (ref 36.0–46.0)
Hemoglobin: 9.6 g/dL — ABNORMAL LOW (ref 12.0–15.0)
MCH: 28 pg (ref 26.0–34.0)
MCHC: 31.4 g/dL (ref 30.0–36.0)
MCV: 89.2 fL (ref 78.0–100.0)
PLATELETS: 291 10*3/uL (ref 150–400)
RBC: 3.43 MIL/uL — ABNORMAL LOW (ref 3.87–5.11)
RDW: 18.4 % — AB (ref 11.5–15.5)
WBC: 5.1 10*3/uL (ref 4.0–10.5)

## 2015-03-22 MED ORDER — POTASSIUM CHLORIDE 10 MEQ/100ML IV SOLN
10.0000 meq | INTRAVENOUS | Status: AC
  Administered 2015-03-22 (×3): 10 meq via INTRAVENOUS
  Filled 2015-03-22 (×3): qty 100

## 2015-03-22 MED ORDER — WARFARIN SODIUM 2 MG PO TABS
2.0000 mg | ORAL_TABLET | Freq: Once | ORAL | Status: AC
  Administered 2015-03-22: 2 mg via ORAL
  Filled 2015-03-22: qty 1

## 2015-03-22 MED ORDER — BISACODYL 10 MG RE SUPP
10.0000 mg | Freq: Once | RECTAL | Status: AC
  Administered 2015-03-22: 10 mg via RECTAL
  Filled 2015-03-22: qty 1

## 2015-03-22 MED ORDER — POTASSIUM CHLORIDE 10 MEQ/100ML IV SOLN: 10.0000 meq | INTRAVENOUS | Status: DC

## 2015-03-22 NOTE — Progress Notes (Signed)
[facility] PROGRESS NOTE  Alicia [name] GYB:[id number] DOB: 11/27/1960 DOA: 03/20/2015 PCP: Minerva Ends, MD  Assessment/Plan: 1. Nausea/vomiting -Case was discussed with Dr. Benay Spice of medical oncology, we believe that this likely secondary to mid to distal small bowel obstruction which could possibly be caused by a peritoneal nodule.  -She thinks she's feeling a little better this morning, has not had N/V today. Denies having a bowel movement however is passing -Prior to advance diet to clears today  2.  Right hydroureteronephrosis -CT scan done on admission did reveal the presence of an increase right hydroureteronephrosis with a transition point in the right pelvis next to the right iliac surgical clips. The  she may have a ureteral stricture. Radiology did not report obstructing mass or calculus. -Her kidney function remains stable, with a.m. lab work showing creatinine of 0.56. -Case was discussed with Dr Matilde Sprang of urology. He stated that in this case with a patient having history of metastatic gastric cancer presenting with asymptomatic right hydronephrosis with normal creatinine, stenting would not be recommended.   -Will continue to monitor.   3.  Acute anemia -Labs showing a downward trend in hemoglobin from 8.8 on admission to 6.6 this morning -I think this likely resulting from slow, chronic blood loss from malignancy. She is also on anticoagulation for history of pulmonary embolism. -She was typed and cross and will transfuse 2 units of packed red blood cells. -AM labs showing improved hemoglobin 9.6. She reports feeling better.   4.  Stage IV gastric cancer.  -She is currently receiving care at the [facility], undergoing treatment with Taxol/ Ramucirumab last treatment on 03/13/2015 -CT scan of abdomen and pelvis showing stable annular constricting mass around gastric body, presence of peritoneal metastatic disease, increased mesenteric soft tissue  nodules, mild increase in sclerotic bone metastasis and spine and pelvis. -Dr. Benay Spice of oncology following  5.  History of pulmonary embolism -INR remains therapeutic at 2.42 with PTT of 26. -Pharmacy following.  6.  Hypokalemia. -A.m. labs show a potassium of 3.2, likely secondary to GI loss -Will replace with IV potassium  Code Status: Full code Family Communication:  Disposition Plan: Advancing to clear liquids   Consultants:  Oncology   HPI/Subjective: Patient is a pleasant 54 year old female with a past medical history of stage IV gastric cancer, admitted to the medicine service on 03/21/2015 when she presented with multiple episodes of nausea and vomiting unable to hold on by mouth including medications. She also complained of worsening abdominal pain. She was further worked up with a CT scan of abdomen and pelvis that showed a stable annular constricting mass involving the gastric body consistent with primary gastric carcinoma. Radiology reported presence of a mid to distal small bowel obstruction. She was placed on bowel rest, made nothing by mouth given IV fluids and IV narcotic analgesics for pain management. She was seen and evaluated by Dr Benay Spice of Oncology.    Objective: Filed Vitals:   03/22/15 0504  BP: 127/88  Pulse: 82  Temp: 98.5 F (36.9 C)  Resp:     Intake/Output Summary (Last 24 hours) at 03/22/15 1255 Last data filed at 03/21/15 1745  Gross per 24 hour  Intake    690 ml  Output    900 ml  Net   -210 ml   Filed Weights   03/20/15 1400 03/20/15 1600  Weight: 64.275 kg (141 lb 11.2 oz) 64.275 kg (141 lb 11.2 oz)    Exam:   General:  Patient appears stable, no acute distress, states not vomiting this morning.  Cardiovascular: regular rate and rhythm normal S1-S2 no murmurs rubs or gallops  Respiratory: normal respiratory effort  Abdomen: improvement to abdominal exam overall soft having mild generalized tenderness, palpable mass over  periumbilical region  Musculoskeletal: no edema  Data Reviewed: Basic Metabolic Panel:  Recent Labs Lab 03/20/15 1130 03/21/15 0510 03/22/15 0510  NA 137 137 139  K 3.5 3.5 3.2*  CL  --  105 107  CO2 25 26 25   GLUCOSE 148* 86 74  BUN 6.5* <5* <5*  CREATININE 0.6 0.56 0.53  CALCIUM 8.8 8.0* 8.1*   Liver Function Tests:  Recent Labs Lab 03/20/15 1130 03/21/15 0510  AST 13 13*  ALT 7 8*  ALKPHOS 117 96  BILITOT 0.59 0.6  PROT 6.1* 5.3*  ALBUMIN 2.9* 2.6*   No results for input(s): LIPASE, AMYLASE in the last 168 hours. No results for input(s): AMMONIA in the last 168 hours. CBC:  Recent Labs Lab 03/20/15 0955 03/21/15 0510 03/22/15 0510  WBC 7.2 4.9 5.1  NEUTROABS 5.0  --   --   HGB 8.8* 6.6* 9.6*  HCT 27.9* 21.7* 30.6*  MCV 89.7 91.6 89.2  PLT 439* 290 291   Cardiac Enzymes: No results for input(s): CKTOTAL, CKMB, CKMBINDEX, TROPONINI in the last 168 hours. BNP (last 3 results) No results for input(s): BNP in the last 8760 hours.  ProBNP (last 3 results) No results for input(s): PROBNP in the last 8760 hours.  CBG: No results for input(s): GLUCAP in the last 168 hours.  No results found for this or any previous visit (from the past 240 hour(s)).   Studies: Ct Abdomen Pelvis W Contrast  03/20/2015   CLINICAL DATA:  Stage IV gastric carcinoma. Intractable nausea and vomiting. Abdominal pain.  EXAM: CT ABDOMEN AND PELVIS WITH CONTRAST  TECHNIQUE: Multidetector CT imaging of the abdomen and pelvis was performed using the standard protocol following bolus administration of intravenous contrast.  CONTRAST:  149mL OMNIPAQUE IOHEXOL 300 MG/ML  SOLN  COMPARISON:  01/08/2015  FINDINGS: Lower Chest: No acute findings.  Hepatobiliary: No liver masses visualized. Mild perihepatic ascites and periportal edema are stable in appearance. Gallbladder is unremarkable.  Pancreas: No mass, inflammatory changes, or other significant abnormality identified.  Spleen:  Within  normal limits in size and appearance.  Adrenals:  No masses identified.  Kidneys/Urinary Tract: Moderate right hydronephrosis and ureterectasis is increased since previous study. There is a transition point to nondilated distal right ureter near surgical clips adjacent to the right iliac vessels, suspicious for a ureteral stricture. No obstructing mass or calculi visualized.  Stomach/Bowel/Peritoneum: Annular constricting mass is again seen involving the body the stomach which shows no significant change, consistent with primary gastric carcinoma. Adjacent soft tissue nodularity in the gastrocolic ligament remains stable, consistent with peritoneal metastatic disease. An irregular soft tissue nodule in the end anterior abdominal wall soft tissues just above the umbilicus measuring 2.5 cm is stable and suspicious for neoplasm. Several mesenteric peritoneal nodules are seen in the right lower quadrant which are increased in size. Largest on image 55 measures 19 mm, compared with 11 mm previously. This is consistent with peritoneal metastatic disease. Mild ascites shows mild decrease since previous study.  Moderately dilated small bowel loops are again seen with a transition point in the right lower quadrant mesentery at the site of the peritoneal nodule described above. This is consistent with a mid to distal small bowel obstruction.  Vascular/Lymphatic: No pathologically enlarged lymph nodes identified. No abdominal aortic aneurysm or other significant retroperitoneal abnormality demonstrated.  Reproductive: Prior hysterectomy noted. Adnexal regions are unremarkable in appearance.  Other:  None.  Musculoskeletal: Several small sclerotic bone lesions are seen in the spine and pelvis, a few which are new or mildly increased in size since previous study, consistent with sclerotic bone metastases.  IMPRESSION: Stable annular constricting mass involving the gastric body, consistent with primary gastric carcinoma. Stable  adjacent soft tissue nodularity in gastrocolic ligament and anterior abdominal wall soft tissues, consistent with peritoneal metastatic disease.  Increased mesenteric soft tissue nodules in the right lower quadrant, also consistent with peritoneal metastatic disease. This results and a mid to distal small bowel obstruction.  Mild ascites has decreased since previous study.  Mild increase in sclerotic bone metastases in the spine and pelvis.  Increased right hydroureteronephrosis with transition point in the right pelvis adjacent to right iliac surgical clips. This suggests possibility of ureteral stricture. No obstructing calculus or mass visualized.   Electronically Signed   By: Earle Gell M.D.   On: 03/20/2015 18:24    Scheduled Meds: . amLODipine  5 mg Oral Daily  . antiseptic oral rinse  7 mL Mouth Rinse q12n4p  . chlorhexidine gluconate  15 mL Mouth Rinse BID  . sodium chloride  3 mL Intravenous Q12H  . Warfarin - Pharmacist Dosing Inpatient   Does not apply q1800   Continuous Infusions: . sodium chloride 75 mL/hr at 03/22/15 0730    Principal Problem:   Nausea with vomiting Active Problems:   Gastric cancer   PE (pulmonary embolism)   Anticoagulated on Coumadin   Abdominal pain   Nausea & vomiting    Time spent: 25 min    Kelvin Cellar  [facility] Pager [phone]. If 7PM-7AM, please contact night-coverage at [email or web address], password [facility] 03/22/2015, 12:55 PM  LOS: 2 days

## 2015-03-22 NOTE — Progress Notes (Signed)
ANTICOAGULATION CONSULT NOTE - Follow Up  Pharmacy Consult for warfarin Indication: pulmonary embolus  Allergies  Allergen Reactions  . Xarelto [Rivaroxaban] Nausea And Vomiting, Palpitations and Other (See Comments)    Headaches and fatigue, numbness in hands/feet and vomitting    Patient Measurements: Height: 5\' 2"  (157.5 cm) Weight: 141 lb 11.2 oz (64.275 kg) IBW/kg (Calculated) : 50.1  Vital Signs: Temp: 98.5 F (36.9 C) (08/06 0504) Temp Source: Oral (08/06 0504) BP: 127/88 mmHg (08/06 0504) Pulse Rate: 82 (08/06 0504)  Labs:  Recent Labs  03/20/15 0955 03/20/15 0955 03/20/15 1130 03/21/15 0510 03/22/15 0510  HGB  --  8.8*  --  6.6* 9.6*  HCT  --  27.9*  --  21.7* 30.6*  PLT  --  439*  --  290 291  LABPROT  --   --   --  26.0* 25.9*  INR 2.20  --   --  2.42* 2.40*  CREATININE  --   --  0.6 0.56 0.53    Estimated Creatinine Clearance: 70 mL/min (by C-G formula based on Cr of 0.53).   Medical History: Past Medical History  Diagnosis Date  . 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 PE (pulmonary embolism) 05/17/2014  . Gastric cancer 06/06/2014  . Constipation 09/04/2014    Medications:  Scheduled:  . amLODipine  5 mg Oral Daily  . antiseptic oral rinse  7 mL Mouth Rinse q12n4p  . chlorhexidine gluconate  15 mL Mouth Rinse BID  . sodium chloride  3 mL Intravenous Q12H  . Warfarin - Pharmacist Dosing Inpatient   Does not apply q1800   Infusions:  . sodium chloride 75 mL/hr at 03/22/15 0730    Assessment: 54 yo with stage IV gastric cancer was at [facility] to get day 8 of Taxol but had intractable N/V so was sent to be admitted. Patient on warfarin PTA at a dose of 4mg  daily except 2mg  on Tu/Fri for hx PE to continue warfarin while inpatient if patient can keep medication down. INR therapeutic on admission.   INR therapeutic (2.40), warfarin dose held yesterday due to Hgb decreased (6.6).  Pt received 2 units PRBC yesterday.  Today's Hgb  9.6.  Diet has been NPO so no Vitamin K intake.  Advancing to clear liquid diet today.   No drug interactions noted  Goal of Therapy:  INR 2-3   Plan:   Warfarin 2 mg today.  Daily PT/INR  Hershal Coria, PharmD, BCPS Pager: [phone] 03/22/2015 1:39 PM

## 2015-03-22 NOTE — Progress Notes (Signed)
IP PROGRESS NOTE  Subjective:   No bowel movement. She is passing flatus. Mild abdominal pain.  Objective: Vital signs in last 24 hours: Blood pressure 127/88, pulse 82, temperature 98.5 F (36.9 C), temperature source Oral, resp. rate 18, height 5\' 2"  (1.575 m), weight 141 lb 11.2 oz (64.275 kg), SpO2 94 %.  Intake/Output from previous day: 08/05 0701 - 08/06 0700 In: 720 [Blood:720] Out: 900 [Urine:900]  Physical Exam:   Abdomen: Soft, periumbilical mass, hypoactive bowel sounds  Portacath/PICC-without erythema  Lab Results:  Recent Labs  03/21/15 0510 03/22/15 0510  WBC 4.9 5.1  HGB 6.6* 9.6*  HCT 21.7* 30.6*  PLT 290 291    BMET  Recent Labs  03/21/15 0510 03/22/15 0510  NA 137 139  K 3.5 3.2*  CL 105 107  CO2 26 25  GLUCOSE 86 74  BUN <5* <5*  CREATININE 0.56 0.53  CALCIUM 8.0* 8.1*    Studies/Results: Dg Abd 1 View  03/20/2015   CLINICAL DATA:  Mid abdominal pain and constipation for 1 week. Patient complains of vomiting 9 times in the last 24 hr. History of gastric cancer with the obstruction.  EXAM: ABDOMEN - 1 VIEW  COMPARISON:  01/15/2015  FINDINGS: The tip of central venous catheter is seen at cavoatrial junction. Lung bases are clear.  The bowel gas pattern is nonspecific/nonobstructive. There is sub pathologic gaseous distension of small and large bowel loops. No radio-opaque calculi. Again seen is ill-defined sclerotic lesion within the right iliac crest. Surgical clips in the pelvis are noted.  IMPRESSION: Nonspecific bowel gas pattern, without definite evidence of obstruction.  Sclerotic lesion within the right ilium. The concern for bony metastases persists.   Electronically Signed   By: Fidela Salisbury M.D.   On: 03/20/2015 12:38   Ct Abdomen Pelvis W Contrast  03/20/2015   CLINICAL DATA:  Stage IV gastric carcinoma. Intractable nausea and vomiting. Abdominal pain.  EXAM: CT ABDOMEN AND PELVIS WITH CONTRAST  TECHNIQUE: Multidetector CT  imaging of the abdomen and pelvis was performed using the standard protocol following bolus administration of intravenous contrast.  CONTRAST:  11mL OMNIPAQUE IOHEXOL 300 MG/ML  SOLN  COMPARISON:  01/08/2015  FINDINGS: Lower Chest: No acute findings.  Hepatobiliary: No liver masses visualized. Mild perihepatic ascites and periportal edema are stable in appearance. Gallbladder is unremarkable.  Pancreas: No mass, inflammatory changes, or other significant abnormality identified.  Spleen:  Within normal limits in size and appearance.  Adrenals:  No masses identified.  Kidneys/Urinary Tract: Moderate right hydronephrosis and ureterectasis is increased since previous study. There is a transition point to nondilated distal right ureter near surgical clips adjacent to the right iliac vessels, suspicious for a ureteral stricture. No obstructing mass or calculi visualized.  Stomach/Bowel/Peritoneum: Annular constricting mass is again seen involving the body the stomach which shows no significant change, consistent with primary gastric carcinoma. Adjacent soft tissue nodularity in the gastrocolic ligament remains stable, consistent with peritoneal metastatic disease. An irregular soft tissue nodule in the end anterior abdominal wall soft tissues just above the umbilicus measuring 2.5 cm is stable and suspicious for neoplasm. Several mesenteric peritoneal nodules are seen in the right lower quadrant which are increased in size. Largest on image 55 measures 19 mm, compared with 11 mm previously. This is consistent with peritoneal metastatic disease. Mild ascites shows mild decrease since previous study.  Moderately dilated small bowel loops are again seen with a transition point in the right lower quadrant mesentery at the site  of the peritoneal nodule described above. This is consistent with a mid to distal small bowel obstruction.  Vascular/Lymphatic: No pathologically enlarged lymph nodes identified. No abdominal aortic  aneurysm or other significant retroperitoneal abnormality demonstrated.  Reproductive: Prior hysterectomy noted. Adnexal regions are unremarkable in appearance.  Other:  None.  Musculoskeletal: Several small sclerotic bone lesions are seen in the spine and pelvis, a few which are new or mildly increased in size since previous study, consistent with sclerotic bone metastases.  IMPRESSION: Stable annular constricting mass involving the gastric body, consistent with primary gastric carcinoma. Stable adjacent soft tissue nodularity in gastrocolic ligament and anterior abdominal wall soft tissues, consistent with peritoneal metastatic disease.  Increased mesenteric soft tissue nodules in the right lower quadrant, also consistent with peritoneal metastatic disease. This results and a mid to distal small bowel obstruction.  Mild ascites has decreased since previous study.  Mild increase in sclerotic bone metastases in the spine and pelvis.  Increased right hydroureteronephrosis with transition point in the right pelvis adjacent to right iliac surgical clips. This suggests possibility of ureteral stricture. No obstructing calculus or mass visualized.   Electronically Signed   By: Earle Gell M.D.   On: 03/20/2015 18:24    Medications: I have reviewed the patient's current medications.  Assessment/Plan:  1. Stage IV gastric cancer  Status post bilateral oophorectomy, total abdominal hysterectomy, omentectomy, resection of abdominal wall and pelvic masses, bilateral pelvic lymphadenectomy 10/18/2013 with the pathology confirming moderate to poorly differentiated adenocarcinoma  Stomach biopsy 11/28/2013 revealed adenocarcinoma  Status post 12 cycles of FOLFOX, last given 04/25/2014  Restaging CT 04/29/2014 with thickening of the stomach and nodular densities adjacent to the greater curvature-unchanged, faint groundglass nodularity in the lungs-nonspecific  Restaging CT 07/22/2014 with indeterminate lung  nodules; moderate wall thickening in the body region of the stomach; fairly extensive omental disease and findings suspicious for abdominal wall and incisional disease.  Started systemic chemotherapy with FOLFIRI on 07/30/14.  Restaging CT evaluation 10/14/2014 following 5 cycles of FOLFIRI showed decreased nodularity in both lungs; overall stable omental nodularity; involution of the previously demonstrated ill-defined soft tissue mass involving the anterior abdominal wall incision.  Cycle 6 FOLFIRI 10/15/2014  Cycle 7 FOLFIRI 10/29/2014  Cycle 8 FOLFIRI 11/12/2014  Cycle 9 FOLFIRI 11/26/2014 (dose reduction of 5-fluorouracil due to mucositis)  CT abdomen/pelvis 12/05/2014 revealed mild progression of peritoneal/omental tumor  Initiation of weekly Taxol (3 weeks on/1 week off) and every 2 week Ramucirumab 12/17/2014  CT abdomen/pelvis 01/08/2015 with findings worrisome for distal small bowel obstruction presumed secondary to adhesions. Apparent right-sided ureteral obstruction possibly secondary to developing adhesions; interval development of a small amount of intra-abdominal ascites. Grossly unchanged peritoneal carcinomatosis. Unchanged 1.4 cm sclerotic osseous metastasis within the right ilium.  Cycle 2 Taxol/Ramucirumab beginning 01/16/2015  Cycle 3 Taxol/ Ramucirumab beginning 02/13/2015  Cycle 4 Taxol/ Ramucirumab beginning 03/13/2015   2. Oxaliplatin neuropathy   3. Bilateral upper and lower lobe pulmonary emboli within segmental branches-now maintained on Coumadin. Followed by Coumadin clinic at the [facility].   4. Neutropenia related to chemotherapy 08/13/2014. Cycle 2 FOLFIRI held. Neulasta added beginning with cycle 2 on 08/20/2014.   5. Nausea following chemotherapy-the anti-emetic regimen was adjusted with cycle 4 FOLFIRI 09/17/2014   6. Diarrhea following FOLFIRI-no improvement with Imodium. Lomotil initiated 10/15/2014.    7. Mouth sores  following cycle 8 FOLFIRI. 5 fluorouracil dose reduced.   8. Admission 12/05/2014 with nausea/vomiting and diarrhea-likely secondary to FOLFIRI chemotherapy   9. Abdominal  pain secondary to carcinomatosis and gastric tumor   10. 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 Admission 01/09/2015 with abdominal pain and nausea/vomiting-a CT confirmed a distal small bowel obstruction. Resolved with bowel rest.   12. Depression. She is currently on Zoloft 100 mg daily.   13. Elevated blood pressure. Currently on Norvasc 5 mg daily.     14. Admission 03/20/2015 with nausea/vomiting-CT consistent with a small bowel obstruction, likely related to peritoneal tumor   Mrs. Birkey appears unchanged. She is trying a clear liquid diet today.    Recommendations: 1. Try clear liquids 2. Surgical consultation if the obstructive symptoms do not improve over the next few days    LOS: 2 days   [place]  03/22/2015, 8:41 AM

## 2015-03-23 LAB — CBC
HCT: 31.5 % — ABNORMAL LOW (ref 36.0–46.0)
HEMOGLOBIN: 9.9 g/dL — AB (ref 12.0–15.0)
MCH: 28.2 pg (ref 26.0–34.0)
MCHC: 31.4 g/dL (ref 30.0–36.0)
MCV: 89.7 fL (ref 78.0–100.0)
Platelets: 303 10*3/uL (ref 150–400)
RBC: 3.51 MIL/uL — AB (ref 3.87–5.11)
RDW: 18.2 % — ABNORMAL HIGH (ref 11.5–15.5)
WBC: 6.4 10*3/uL (ref 4.0–10.5)

## 2015-03-23 LAB — BASIC METABOLIC PANEL
ANION GAP: 7 (ref 5–15)
CALCIUM: 8.2 mg/dL — AB (ref 8.9–10.3)
CHLORIDE: 106 mmol/L (ref 101–111)
CO2: 26 mmol/L (ref 22–32)
Creatinine, Ser: 0.45 mg/dL (ref 0.44–1.00)
GFR calc Af Amer: >60 mL/min (ref >60)
Glucose, Bld: 99 mg/dL (ref 65–99)
POTASSIUM: 3.2 mmol/L — AB (ref 3.5–5.1)
SODIUM: 139 mmol/L (ref 135–145)

## 2015-03-23 LAB — TYPE AND SCREEN
ABO/RH(D): O POS
Antibody Screen: NEGATIVE
UNIT DIVISION: 0
Unit division: 0

## 2015-03-23 LAB — PROTIME-INR
INR: 2.51 — ABNORMAL HIGH (ref 0.00–1.49)
Prothrombin Time: 26.8 seconds — ABNORMAL HIGH (ref 11.6–15.2)

## 2015-03-23 MED ORDER — WARFARIN SODIUM 2 MG PO TABS
2.0000 mg | ORAL_TABLET | Freq: Once | ORAL | Status: AC
Start: 2015-03-23 — End: 2015-03-23
  Administered 2015-03-23: 2 mg via ORAL
  Filled 2015-03-23: qty 1

## 2015-03-23 NOTE — Progress Notes (Addendum)
ANTICOAGULATION CONSULT NOTE - Follow Up  Pharmacy Consult for warfarin Indication: pulmonary embolus  Allergies  Allergen Reactions  . Xarelto [Rivaroxaban] Nausea And Vomiting, Palpitations and Other (See Comments)    Headaches and fatigue, numbness in hands/feet and vomitting    Patient Measurements: Height: 5\' 2"  (157.5 cm) Weight: 141 lb 11.2 oz (64.275 kg) IBW/kg (Calculated) : 50.1  Vital Signs: Temp: 98.2 F (36.8 C) (08/07 0505) Temp Source: Oral (08/07 0505) BP: 127/82 mmHg (08/07 0505) Pulse Rate: 80 (08/07 0505)  Labs:  Recent Labs  03/20/15 1130 03/21/15 0510 03/22/15 0510 03/23/15 0500  HGB  --  6.6* 9.6*  --   HCT  --  21.7* 30.6*  --   PLT  --  290 291  --   LABPROT  --  26.0* 25.9* 26.8*  INR  --  2.42* 2.40* 2.51*  CREATININE 0.6 0.56 0.53  --     Estimated Creatinine Clearance: 70 mL/min (by C-G formula based on Cr of 0.53).   Medical History: Past Medical History  Diagnosis Date  . 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 PE (pulmonary embolism) 05/17/2014  . Gastric cancer 06/06/2014  . Constipation 09/04/2014    Medications:  Scheduled:  . amLODipine  5 mg Oral Daily  . antiseptic oral rinse  7 mL Mouth Rinse q12n4p  . chlorhexidine gluconate  15 mL Mouth Rinse BID  . sodium chloride  3 mL Intravenous Q12H  . Warfarin - Pharmacist Dosing Inpatient   Does not apply q1800   Infusions:  . sodium chloride 75 mL/hr at 03/23/15 0401    Assessment: 53 yo with stage IV gastric cancer was at [facility] to get day 8 of Taxol but had intractable N/V so was sent to be admitted. Patient on warfarin PTA at a dose of 4mg  daily except 2mg  on Tu/Fri for hx PE to continue warfarin while inpatient if patient can keep medication down. INR therapeutic on admission.   INR therapeutic (2.51), warfarin dose held 8/5 due to Hgb decrease (6.6) and was resumed yesterday after Hgb improved to 9.6 following 2 units PRBC.  MD attributing low Hgb to  slow, chronic blood loss from malignancy.  CBC stable today.  Diet advancing to clear liquid diet yesterday but no intake recorded, therefore, no Vitamin K intake.   No drug interactions noted  Goal of Therapy:  INR 2-3   Plan:   Warfarin 2 mg today.  Daily PT/INR  Hershal Coria, PharmD, BCPS Pager: [phone] 03/23/2015 10:52 AM

## 2015-03-23 NOTE — Progress Notes (Signed)
[facility] PROGRESS NOTE  Devany Aja JSH:[id number] DOB: 03-Jun-1961 DOA: 03/20/2015 PCP: Minerva Ends, MD  Assessment/Plan: 1. Nausea/vomiting -Case was discussed with Dr. Benay Spice of medical oncology, we believe that this likely secondary to mid to distal small bowel obstruction which could possibly be caused by a peritoneal nodule.  -She has tolerating clears over the past 24 hours, clinically looks better and will advance her diet to full liquids today.  2.  Right hydroureteronephrosis -CT scan done on admission did reveal the presence of an increase right hydroureteronephrosis with a transition point in the right pelvis next to the right iliac surgical clips. The  she may have a ureteral stricture. Radiology did not report obstructing mass or calculus. -Her kidney function remains stable -Case was discussed with Dr Matilde Sprang of urology. He stated that in this case with a patient having history of metastatic gastric cancer presenting with asymptomatic right hydronephrosis with normal creatinine, stenting would not be recommended.   -Will continue to monitor.   3.  Acute anemia -Labs showing a downward trend in hemoglobin from 8.8 on admission to 6.6 this morning -I think this likely resulting from slow, chronic blood loss from malignancy. She is also on anticoagulation for history of pulmonary embolism. -She was typed and cross and will transfuse 2 units of packed red blood cells. -Will continue to follow CBC  4.  Stage IV gastric cancer.  -She is currently receiving care at the [facility], undergoing treatment with Taxol/ Ramucirumab last treatment on 03/13/2015 -CT scan of abdomen and pelvis showing stable annular constricting mass around gastric body, presence of peritoneal metastatic disease, increased mesenteric soft tissue nodules, mild increase in sclerotic bone metastasis and spine and pelvis. -Dr. Benay Spice of oncology following  5.  History of pulmonary  embolism -INR remains therapeutic at 2.42 with PTT of 26. -Pharmacy following.  6.  Hypokalemia. -Likely secondary to GI loss, received IV potassium replacement  Code Status: Full code Family Communication:  Disposition Plan: Advancing to clear liquids   Consultants:  Oncology   HPI/Subjective: Patient is a pleasant 54 year old female with a past medical history of stage IV gastric cancer, admitted to the medicine service on 03/21/2015 when she presented with multiple episodes of nausea and vomiting unable to hold on by mouth including medications. She also complained of worsening abdominal pain. She was further worked up with a CT scan of abdomen and pelvis that showed a stable annular constricting mass involving the gastric body consistent with primary gastric carcinoma. Radiology reported presence of a mid to distal small bowel obstruction. She was placed on bowel rest, made nothing by mouth given IV fluids and IV narcotic analgesics for pain management. She was seen and evaluated by Dr Benay Spice of Oncology.    Objective: Filed Vitals:   03/23/15 0505  BP: 127/82  Pulse: 80  Temp: 98.2 F (36.8 C)  Resp: 16    Intake/Output Summary (Last 24 hours) at 03/23/15 1137 Last data filed at 03/23/15 0530  Gross per 24 hour  Intake      0 ml  Output   1150 ml  Net  -1150 ml   Filed Weights   03/20/15 1400 03/20/15 1600  Weight: 64.275 kg (141 lb 11.2 oz) 64.275 kg (141 lb 11.2 oz)    Exam:   General:  Patient appears stable, no acute distress, states not vomiting this morning.  Cardiovascular: regular rate and rhythm normal S1-S2 no murmurs rubs or gallops  Respiratory: normal respiratory effort  Abdomen: improvement to abdominal exam overall soft having mild generalized tenderness, palpable mass over periumbilical region  Musculoskeletal: no edema  Data Reviewed: Basic Metabolic Panel:  Recent Labs Lab 03/20/15 1130 03/21/15 0510 03/22/15 0510  NA 137 137 139   K 3.5 3.5 3.2*  CL  --  105 107  CO2 25 26 25   GLUCOSE 148* 86 74  BUN 6.5* <5* <5*  CREATININE 0.6 0.56 0.53  CALCIUM 8.8 8.0* 8.1*   Liver Function Tests:  Recent Labs Lab 03/20/15 1130 03/21/15 0510  AST 13 13*  ALT 7 8*  ALKPHOS 117 96  BILITOT 0.59 0.6  PROT 6.1* 5.3*  ALBUMIN 2.9* 2.6*   No results for input(s): LIPASE, AMYLASE in the last 168 hours. No results for input(s): AMMONIA in the last 168 hours. CBC:  Recent Labs Lab 03/20/15 0955 03/21/15 0510 03/22/15 0510  WBC 7.2 4.9 5.1  NEUTROABS 5.0  --   --   HGB 8.8* 6.6* 9.6*  HCT 27.9* 21.7* 30.6*  MCV 89.7 91.6 89.2  PLT 439* 290 291   Cardiac Enzymes: No results for input(s): CKTOTAL, CKMB, CKMBINDEX, TROPONINI in the last 168 hours. BNP (last 3 results) No results for input(s): BNP in the last 8760 hours.  ProBNP (last 3 results) No results for input(s): PROBNP in the last 8760 hours.  CBG: No results for input(s): GLUCAP in the last 168 hours.  No results found for this or any previous visit (from the past 240 hour(s)).   Studies: No results found.  Scheduled Meds: . amLODipine  5 mg Oral Daily  . antiseptic oral rinse  7 mL Mouth Rinse q12n4p  . chlorhexidine gluconate  15 mL Mouth Rinse BID  . sodium chloride  3 mL Intravenous Q12H  . warfarin  2 mg Oral ONCE-1800  . Warfarin - Pharmacist Dosing Inpatient   Does not apply q1800   Continuous Infusions: . sodium chloride 75 mL/hr at 03/23/15 0401    Principal Problem:   Nausea with vomiting Active Problems:   Gastric cancer   PE (pulmonary embolism)   Anticoagulated on Coumadin   Abdominal pain   Nausea & vomiting    Time spent: 25 min    Kelvin Cellar  [facility] Pager [phone]. If 7PM-7AM, please contact night-coverage at [email or web address], password [facility] 03/23/2015, 11:37 AM  LOS: 3 days

## 2015-03-24 ENCOUNTER — Inpatient Hospital Stay (HOSPITAL_COMMUNITY): Payer: Medicaid Other

## 2015-03-24 LAB — BASIC METABOLIC PANEL
Anion gap: 5 (ref 5–15)
BUN: <5 mg/dL — ABNORMAL LOW (ref 6–20)
CALCIUM: 8.3 mg/dL — AB (ref 8.9–10.3)
CO2: 28 mmol/L (ref 22–32)
Chloride: 106 mmol/L (ref 101–111)
Creatinine, Ser: 0.39 mg/dL — ABNORMAL LOW (ref 0.44–1.00)
GFR calc non Af Amer: >60 mL/min (ref >60)
GLUCOSE: 91 mg/dL (ref 65–99)
Potassium: 2.9 mmol/L — ABNORMAL LOW (ref 3.5–5.1)
Sodium: 139 mmol/L (ref 135–145)

## 2015-03-24 LAB — CBC
HEMATOCRIT: 31.7 % — AB (ref 36.0–46.0)
Hemoglobin: 10 g/dL — ABNORMAL LOW (ref 12.0–15.0)
MCH: 28.2 pg (ref 26.0–34.0)
MCHC: 31.5 g/dL (ref 30.0–36.0)
MCV: 89.5 fL (ref 78.0–100.0)
PLATELETS: 313 10*3/uL (ref 150–400)
RBC: 3.54 MIL/uL — ABNORMAL LOW (ref 3.87–5.11)
RDW: 18.1 % — ABNORMAL HIGH (ref 11.5–15.5)
WBC: 6.3 10*3/uL (ref 4.0–10.5)

## 2015-03-24 LAB — PROTIME-INR
INR: 2.35 — ABNORMAL HIGH (ref 0.00–1.49)
Prothrombin Time: 25.5 seconds — ABNORMAL HIGH (ref 11.6–15.2)

## 2015-03-24 MED ORDER — POTASSIUM CHLORIDE 10 MEQ/100ML IV SOLN
10.0000 meq | INTRAVENOUS | Status: AC
  Administered 2015-03-24 (×3): 10 meq via INTRAVENOUS
  Filled 2015-03-24 (×3): qty 100

## 2015-03-24 MED ORDER — WARFARIN SODIUM 2 MG PO TABS
2.0000 mg | ORAL_TABLET | Freq: Once | ORAL | Status: AC
Start: 2015-03-24 — End: 2015-03-24
  Administered 2015-03-24: 2 mg via ORAL
  Filled 2015-03-24: qty 1

## 2015-03-24 MED ORDER — IOHEXOL 300 MG/ML  SOLN
30.0000 mL | Freq: Once | INTRAMUSCULAR | Status: AC | PRN
  Administered 2015-03-24: 30 mL via ORAL

## 2015-03-24 MED ORDER — MORPHINE SULFATE (CONCENTRATE) 10 MG/0.5ML PO SOLN
10.0000 mg | ORAL | Status: DC | PRN
  Administered 2015-03-24 – 2015-03-25 (×3): 10 mg via ORAL
  Filled 2015-03-24 (×3): qty 0.5

## 2015-03-24 MED ORDER — POTASSIUM CHLORIDE 10 MEQ/100ML IV SOLN
10.0000 meq | INTRAVENOUS | Status: AC
  Administered 2015-03-24 (×2): 10 meq via INTRAVENOUS
  Filled 2015-03-24 (×2): qty 100

## 2015-03-24 MED ORDER — POTASSIUM CHLORIDE CRYS ER 20 MEQ PO TBCR
40.0000 meq | EXTENDED_RELEASE_TABLET | Freq: Four times a day (QID) | ORAL | Status: DC
  Administered 2015-03-24: 40 meq via ORAL
  Filled 2015-03-24: qty 2

## 2015-03-24 NOTE — Progress Notes (Signed)
IP PROGRESS NOTE  Subjective:   She tolerated liquids yesterday. She had a bowel movement yesterday. No further emesis. She complains of pain and fullness in the upper abdomen.  Objective: Vital signs in last 24 hours: Blood pressure 144/92, pulse 85, temperature 98.1 F (36.7 C), temperature source Oral, resp. rate 16, height 5\' 2"  (1.575 m), weight 141 lb 11.2 oz (64.275 kg), SpO2 97 %.  Intake/Output from previous day: 08/07 0701 - 08/08 0700 In: 600 [I.V.:600] Out: 1550 [Urine:1550]  Physical Exam: HEENT-no thrush Lungs-decreased breath sounds at the lower chest, no respiratory distress Cardiac-regular rate and rhythm  Abdomen: Soft, periumbilical mass, hypoactive bowel sounds, tender in the subxiphoid region Vascular-no leg edema  Portacath/PICC-without erythema  Lab Results:  Recent Labs  03/23/15 1215 03/24/15 0500  WBC 6.4 6.3  HGB 9.9* 10.0*  HCT 31.5* 31.7*  PLT 303 313    BMET  Recent Labs  03/23/15 1215 03/24/15 0500  NA 139 139  K 3.2* 2.9*  CL 106 106  CO2 26 28  GLUCOSE 99 91  BUN <5* <5*  CREATININE 0.45 0.39*  CALCIUM 8.2* 8.3*    Studies/Results: Dg Ugi  W/kub  03/24/2015   CLINICAL DATA:  1  EXAM: WATER SOLUBLE UPPER GI SERIES  TECHNIQUE: Single-column upper GI series was performed using water soluble contrast.  CONTRAST:  70mL OMNIPAQUE IOHEXOL 300 MG/ML  SOLN  COMPARISON:  CT scan 03/20/2015  FLUOROSCOPY TIME:  Radiation Exposure Index (as provided by the fluoroscopic device):  If the device does not provide the exposure index:  Fluoroscopy Time (in minutes and seconds):  2 minutes and 0 seconds  Number of Acquired Images:  FINDINGS: The distal esophagus is grossly normal. Significant irregular narrowing in the body region the stomach due to the patient's known gastric carcinoma. Contrast does get through to the antropyloric region and into the duodenum but is quite slow in progression. Delayed emptying from the stomach into the duodenum may  be due to a diffuse ileus or gastroparesis. No obvious mechanical obstruction. No leaking oral contrast to suggest perforation.  IMPRESSION: Irregular narrowing of the body region of the stomach consistent with patient's known gastric cancer. No complete obstruction or perforation.  Delayed emptying of the stomach likely due to diffuse ileus/gastroparesis.   Electronically Signed   By: Marijo Sanes M.D.   On: 03/24/2015 11:15    Medications: I have reviewed the patient's current medications.  Assessment/Plan:  1. Stage IV gastric cancer  Status post bilateral oophorectomy, total abdominal hysterectomy, omentectomy, resection of abdominal wall and pelvic masses, bilateral pelvic lymphadenectomy 10/18/2013 with the pathology confirming moderate to poorly differentiated adenocarcinoma  Stomach biopsy 11/28/2013 revealed adenocarcinoma  Status post 12 cycles of FOLFOX, last given 04/25/2014  Restaging CT 04/29/2014 with thickening of the stomach and nodular densities adjacent to the greater curvature-unchanged, faint groundglass nodularity in the lungs-nonspecific  Restaging CT 07/22/2014 with indeterminate lung nodules; moderate wall thickening in the body region of the stomach; fairly extensive omental disease and findings suspicious for abdominal wall and incisional disease.  Started systemic chemotherapy with FOLFIRI on 07/30/14.  Restaging CT evaluation 10/14/2014 following 5 cycles of FOLFIRI showed decreased nodularity in both lungs; overall stable omental nodularity; involution of the previously demonstrated ill-defined soft tissue mass involving the anterior abdominal wall incision.  Cycle 6 FOLFIRI 10/15/2014  Cycle 7 FOLFIRI 10/29/2014  Cycle 8 FOLFIRI 11/12/2014  Cycle 9 FOLFIRI 11/26/2014 (dose reduction of 5-fluorouracil due to mucositis)  CT abdomen/pelvis 12/05/2014 revealed mild  progression of peritoneal/omental tumor  Initiation of weekly Taxol (3 weeks on/1 week off)  and every 2 week Ramucirumab 12/17/2014  CT abdomen/pelvis 01/08/2015 with findings worrisome for distal small bowel obstruction presumed secondary to adhesions. Apparent right-sided ureteral obstruction possibly secondary to developing adhesions; interval development of a small amount of intra-abdominal ascites. Grossly unchanged peritoneal carcinomatosis. Unchanged 1.4 cm sclerotic osseous metastasis within the right ilium.  Cycle 2 Taxol/Ramucirumab beginning 01/16/2015  Cycle 3 Taxol/ Ramucirumab beginning 02/13/2015  Cycle 4 Taxol/ Ramucirumab beginning 03/13/2015   2. Oxaliplatin neuropathy   3. Bilateral upper and lower lobe pulmonary emboli within segmental branches-now maintained on Coumadin. Followed by Coumadin clinic at the [facility]   4. Neutropenia related to chemotherapy 08/13/2014. Cycle 2 FOLFIRI held. Neulasta added beginning with cycle 2 on 08/20/2014.   5. Nausea following chemotherapy-the anti-emetic regimen was adjusted with cycle 4 FOLFIRI 09/17/2014   6. Diarrhea following FOLFIRI-no improvement with Imodium. Lomotil initiated 10/15/2014.    7. Mouth sores following cycle 8 FOLFIRI. 5 fluorouracil dose reduced.   8. Admission 12/05/2014 with nausea/vomiting and diarrhea-likely secondary to FOLFIRI chemotherapy   9. Abdominal pain secondary to carcinomatosis and gastric tumor   10. 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 Admission 01/09/2015 with abdominal pain and nausea/vomiting-a CT confirmed a distal small bowel obstruction. Resolved with bowel rest.   12. Depression. She is currently on Zoloft 100 mg daily.   13. Elevated blood pressure. Currently on Norvasc 5 mg daily.     14. Admission 03/20/2015 with nausea/vomiting-CT consistent with a small bowel obstruction, likely related to peritoneal tumor   She continues to have pain and abdominal fullness.  Her symptoms are likely related to tumor in the stomach in addition to the more distal bowel obstruction. I recommend continuing a liquid diet as tolerated and try an oral narcotic for pain. If she has recurrent emesis we will need to place a palliative gastrostomy tube.    Recommendations: 1. Liquid diet 2. Roxanol for pain not relieved with oxycodone    LOS: 4 days   [place]  03/24/2015, 1:58 PM

## 2015-03-24 NOTE — Progress Notes (Signed)
[facility] PROGRESS NOTE  Alicia [name] MPN:[id number] DOB: 1961/06/25 DOA: 03/20/2015 PCP: Minerva Ends, MD  Assessment/Plan: 1. Nausea/vomiting/Abdominal Pain -Case was discussed with Dr. Benay Spice of medical oncology, we believe that this likely secondary to mid to distal small bowel obstruction which could possibly be caused by a peritoneal nodule.  -On 03/24/2015 patient reporting increasing abdominal pain over the epigastric region. She has not had further episodes of nausea and vomiting. -She had an per GI series that showed irregular narrowing of the body region of the stomach consistent with gastric cancer. Radiology reporting that there is no complete obstruction or perforation. Radiology also reported delayed emptying of the stomach likely due to gastroparesis/diffuse ileus -Pain management was changed to Roxanol. Will keep her on clears for now  2.  Right hydroureteronephrosis -CT scan done on admission did reveal the presence of an increase right hydroureteronephrosis with a transition point in the right pelvis next to the right iliac surgical clips. The  she may have a ureteral stricture. Radiology did not report obstructing mass or calculus. -Her kidney function remains stable -Case was discussed with Dr Matilde Sprang of urology. He stated that in this case with a patient having history of metastatic gastric cancer presenting with asymptomatic right hydronephrosis with normal creatinine, stenting would not be recommended.   -Will continue to monitor.   3.  Acute anemia -Labs showing a downward trend in hemoglobin from 8.8 on admission to 6.6 this morning -I think this likely resulting from slow, chronic blood loss from malignancy. She is also on anticoagulation for history of pulmonary embolism. -She was typed and cross and will transfuse 2 units of packed red blood cells. -Hg remains stable -Will continue to follow CBC  4.  Stage IV gastric cancer.  -She is  currently receiving care at the [facility], undergoing treatment with Taxol/ Ramucirumab last treatment on 03/13/2015 -CT scan of abdomen and pelvis showing stable annular constricting mass around gastric body, presence of peritoneal metastatic disease, increased mesenteric soft tissue nodules, mild increase in sclerotic bone metastasis and spine and pelvis. -Dr. Benay Spice of oncology following  5.  History of pulmonary embolism -INR remains therapeutic at 2.42 with PTT of 26. -Pharmacy following.  6.  Hypokalemia. -Her potassium remains low, will replace with 5 runs of potassium chloride today  Code Status: Full code Family Communication:  Disposition Plan:    Consultants:  Oncology   HPI/Subjective: Patient is a pleasant 54 year old female with a past medical history of stage IV gastric cancer, admitted to the medicine service on 03/21/2015 when she presented with multiple episodes of nausea and vomiting unable to hold on by mouth including medications. She also complained of worsening abdominal pain. She was further worked up with a CT scan of abdomen and pelvis that showed a stable annular constricting mass involving the gastric body consistent with primary gastric carcinoma. Radiology reported presence of a mid to distal small bowel obstruction. She was placed on bowel rest, made nothing by mouth given IV fluids and IV narcotic analgesics for pain management. She was seen and evaluated by Dr Benay Spice of Oncology.    Objective: Filed Vitals:   03/24/15 1255  BP: 144/92  Pulse: 85  Temp: 98.1 F (36.7 C)  Resp: 16    Intake/Output Summary (Last 24 hours) at 03/24/15 1547 Last data filed at 03/24/15 1442  Gross per 24 hour  Intake   1500 ml  Output   1750 ml  Net   -250 ml  Filed Weights   03/20/15 1400 03/20/15 1600  Weight: 64.275 kg (141 lb 11.2 oz) 64.275 kg (141 lb 11.2 oz)    Exam:   General:  Patient appears stable, no acute distress, states not vomiting  this morning.  Cardiovascular: regular rate and rhythm normal S1-S2 no murmurs rubs or gallops  Respiratory: normal respiratory effort  Abdomen: improvement to abdominal exam overall soft having mild generalized tenderness, palpable mass over periumbilical region  Musculoskeletal: no edema  Data Reviewed: Basic Metabolic Panel:  Recent Labs Lab 03/20/15 1130 03/21/15 0510 03/22/15 0510 03/23/15 1215 03/24/15 0500  NA 137 137 139 139 139  K 3.5 3.5 3.2* 3.2* 2.9*  CL  --  105 107 106 106  CO2 25 26 25 26 28   GLUCOSE 148* 86 74 99 91  BUN 6.5* <5* <5* <5* <5*  CREATININE 0.6 0.56 0.53 0.45 0.39*  CALCIUM 8.8 8.0* 8.1* 8.2* 8.3*   Liver Function Tests:  Recent Labs Lab 03/20/15 1130 03/21/15 0510  AST 13 13*  ALT 7 8*  ALKPHOS 117 96  BILITOT 0.59 0.6  PROT 6.1* 5.3*  ALBUMIN 2.9* 2.6*   No results for input(s): LIPASE, AMYLASE in the last 168 hours. No results for input(s): AMMONIA in the last 168 hours. CBC:  Recent Labs Lab 03/20/15 0955 03/21/15 0510 03/22/15 0510 03/23/15 1215 03/24/15 0500  WBC 7.2 4.9 5.1 6.4 6.3  NEUTROABS 5.0  --   --   --   --   HGB 8.8* 6.6* 9.6* 9.9* 10.0*  HCT 27.9* 21.7* 30.6* 31.5* 31.7*  MCV 89.7 91.6 89.2 89.7 89.5  PLT 439* 290 291 303 313   Cardiac Enzymes: No results for input(s): CKTOTAL, CKMB, CKMBINDEX, TROPONINI in the last 168 hours. BNP (last 3 results) No results for input(s): BNP in the last 8760 hours.  ProBNP (last 3 results) No results for input(s): PROBNP in the last 8760 hours.  CBG: No results for input(s): GLUCAP in the last 168 hours.  No results found for this or any previous visit (from the past 240 hour(s)).   Studies: Dg Ugi  W/kub  03/24/2015   CLINICAL DATA:  1  EXAM: WATER SOLUBLE UPPER GI SERIES  TECHNIQUE: Single-column upper GI series was performed using water soluble contrast.  CONTRAST:  15mL OMNIPAQUE IOHEXOL 300 MG/ML  SOLN  COMPARISON:  CT scan 03/20/2015  FLUOROSCOPY TIME:   Radiation Exposure Index (as provided by the fluoroscopic device):  If the device does not provide the exposure index:  Fluoroscopy Time (in minutes and seconds):  2 minutes and 0 seconds  Number of Acquired Images:  FINDINGS: The distal esophagus is grossly normal. Significant irregular narrowing in the body region the stomach due to the patient's known gastric carcinoma. Contrast does get through to the antropyloric region and into the duodenum but is quite slow in progression. Delayed emptying from the stomach into the duodenum may be due to a diffuse ileus or gastroparesis. No obvious mechanical obstruction. No leaking oral contrast to suggest perforation.  IMPRESSION: Irregular narrowing of the body region of the stomach consistent with patient's known gastric cancer. No complete obstruction or perforation.  Delayed emptying of the stomach likely due to diffuse ileus/gastroparesis.   Electronically Signed   By: Marijo Sanes M.D.   On: 03/24/2015 11:15    Scheduled Meds: . amLODipine  5 mg Oral Daily  . antiseptic oral rinse  7 mL Mouth Rinse q12n4p  . chlorhexidine gluconate  15 mL Mouth Rinse  BID  . sodium chloride  3 mL Intravenous Q12H  . warfarin  2 mg Oral ONCE-1800  . Warfarin - Pharmacist Dosing Inpatient   Does not apply q1800   Continuous Infusions: . sodium chloride 75 mL/hr at 03/23/15 1705    Principal Problem:   Nausea with vomiting Active Problems:   Gastric cancer   PE (pulmonary embolism)   Anticoagulated on Coumadin   Abdominal pain   Nausea & vomiting    Time spent: 25 min    Kelvin Cellar  [facility] Pager [phone]. If 7PM-7AM, please contact night-coverage at [email or web address], password [facility] 03/24/2015, 3:47 PM  LOS: 4 days

## 2015-03-24 NOTE — Progress Notes (Signed)
ANTICOAGULATION CONSULT NOTE - Follow Up  Pharmacy Consult for warfarin Indication: pulmonary embolus  Allergies  Allergen Reactions  . Xarelto [Rivaroxaban] Nausea And Vomiting, Palpitations and Other (See Comments)    Headaches and fatigue, numbness in hands/feet and vomitting    Patient Measurements: Height: 5\' 2"  (157.5 cm) Weight: 141 lb 11.2 oz (64.275 kg) IBW/kg (Calculated) : 50.1  Vital Signs: Temp: 98.2 F (36.8 C) (08/08 0505) Temp Source: Oral (08/08 0505) BP: 144/86 mmHg (08/08 0505) Pulse Rate: 83 (08/08 0505)  Labs:  Recent Labs  03/22/15 0510 03/23/15 0500 03/23/15 1215 03/24/15 0500  HGB 9.6*  --  9.9* 10.0*  HCT 30.6*  --  31.5* 31.7*  PLT 291  --  303 313  LABPROT 25.9* 26.8*  --  25.5*  INR 2.40* 2.51*  --  2.35*  CREATININE 0.53  --  0.45 0.39*    Estimated Creatinine Clearance: 70 mL/min (by C-G formula based on Cr of 0.39).   Medical History: Past Medical History  Diagnosis Date  . 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 PE (pulmonary embolism) 05/17/2014  . Gastric cancer 06/06/2014  . Constipation 09/04/2014    Medications:  Scheduled:  . amLODipine  5 mg Oral Daily  . antiseptic oral rinse  7 mL Mouth Rinse q12n4p  . chlorhexidine gluconate  15 mL Mouth Rinse BID  . potassium chloride  10 mEq Intravenous Q1 Hr x 3  . sodium chloride  3 mL Intravenous Q12H  . Warfarin - Pharmacist Dosing Inpatient   Does not apply q1800   Infusions:  . sodium chloride 75 mL/hr at 03/23/15 1705    Assessment: 54 yo with stage IV gastric cancer was at [facility] to get day 8 of Taxol but had intractable N/V so was sent to be admitted. Patient on warfarin PTA at a dose of 4mg  daily except 2mg  on Tu/Fri for hx PE to continue warfarin while inpatient if patient can keep medication down. INR therapeutic on admission.  Significant events:  INR therapeutic (2.51)on 8/5 but warfarin dose held due to Hgb decrease (6.6) and was resumed 8/6  after Hgb improved to 9.6 following 2 units PRBC.  MD attributing low Hgb to slow, chronic blood loss from malignancy.   Diet advanced to clear liquid 8/6  03/24/2015  INR 2.35 (therapeutic)  Diet advanced to full liquid diet 8/7 but no intake recorded, therefore, no Vitamin K intake.  Pt states she feels full per RN.  No drug interactions noted  GI study today  Goal of Therapy:  INR 2-3   Plan:   Warfarin 2 mg today.  Daily PT/INR  Dolly Rias RPh 03/24/2015, 9:34 AM Pager [phone]

## 2015-03-25 LAB — BASIC METABOLIC PANEL
Anion gap: 9 (ref 5–15)
BUN: <5 mg/dL — ABNORMAL LOW (ref 6–20)
CHLORIDE: 103 mmol/L (ref 101–111)
CO2: 25 mmol/L (ref 22–32)
CREATININE: 0.41 mg/dL — AB (ref 0.44–1.00)
Calcium: 8.3 mg/dL — ABNORMAL LOW (ref 8.9–10.3)
GFR calc Af Amer: >60 mL/min (ref >60)
GFR calc non Af Amer: >60 mL/min (ref >60)
GLUCOSE: 85 mg/dL (ref 65–99)
Potassium: 3.6 mmol/L (ref 3.5–5.1)
Sodium: 137 mmol/L (ref 135–145)

## 2015-03-25 LAB — PROTIME-INR
INR: 2.23 — ABNORMAL HIGH (ref 0.00–1.49)
Prothrombin Time: 24.5 seconds — ABNORMAL HIGH (ref 11.6–15.2)

## 2015-03-25 MED ORDER — WARFARIN SODIUM 4 MG PO TABS: 4.0000 mg | ORAL_TABLET | ORAL | Status: DC

## 2015-03-25 MED ORDER — OXYCODONE HCL 5 MG PO TABS: 5.0000 mg | ORAL_TABLET | Freq: Four times a day (QID) | ORAL | Status: DC | PRN

## 2015-03-25 MED ORDER — AMLODIPINE BESYLATE 5 MG PO TABS: 5.0000 mg | ORAL_TABLET | Freq: Every day | ORAL | Status: DC

## 2015-03-25 MED ORDER — POTASSIUM CHLORIDE CRYS ER 20 MEQ PO TBCR: 40.0000 meq | EXTENDED_RELEASE_TABLET | Freq: Every day | ORAL | Status: DC

## 2015-03-25 MED ORDER — HEPARIN SOD (PORK) LOCK FLUSH 100 UNIT/ML IV SOLN
500.0000 [IU] | Freq: Once | INTRAVENOUS | Status: AC
  Administered 2015-03-25: 500 [IU] via INTRAVENOUS
  Filled 2015-03-25: qty 5

## 2015-03-25 NOTE — Discharge Summary (Signed)
Physician Discharge Summary  Alicia [name] EQA:[id number] DOB: 1960-12-29 DOA: 03/20/2015  PCP: Minerva Ends, MD  Admit date: 03/20/2015 Discharge date: 03/25/2015  Time spent: 35 minutes  Recommendations for Outpatient Follow-up:  1. Please follow up on PT/INR on Thurs 03/26/2015 2. Follow up on BMP on hospital follow up appoint, she had hypokalemia and was given potassium replacement. Her home potassium dose was doubled to 40 meq daily  Discharge Diagnoses:  Principal Problem:   Nausea with vomiting Active Problems:   Gastric cancer   PE (pulmonary embolism)   Anticoagulated on Coumadin   Abdominal pain   Nausea & vomiting   Discharge Condition: Stable  Diet recommendation: Soft  Filed Weights   03/20/15 1400 03/20/15 1600  Weight: 64.275 kg (141 lb 11.2 oz) 64.275 kg (141 lb 11.2 oz)    History of present illness:  Alicia [name] is a 54 y.o. female with a past medical history of stage IV gastric cancer with last staging CT scan of abdomen and pelvis performed on 01/08/2015 that showed findings consistent with distal small bowel obstruction thought to be secondary to adhesions, right-sided ureteral obstruction, interval development of small amount of intra-abdominal ascites and grossly unchanged peritoneal carcinomatosis. Also noted was 1.4 cm sclerotic osseous metastatic lesion within right ilium. On 03/05/2015 she received her fourth cycle of Taxol/ Ramucirumab. She has has a history of pulmonary embolism for which she had been taking Coumadin. She presents as a direct admission from the [facility] as she has had intractable nausea and vomiting for the past 48 hours. She reports inability to hold anything by mouth down including her medications. She also complains of epigastric abdominal pain which has worsened in the past 24 hours and is requesting IV pain medication. She reports passing flatus. She denies fevers, chills, hemoptysis, hematemesis, diarrhea, chest pain,  shortness of breath. She had a KUB done at the cancer center which did not reveal definite evidence of obstruction.   Hospital Course:  Patient is a pleasant 54 year old female with a past medical history of stage IV gastric cancer, admitted to the medicine service on 03/21/2015 when she presented with multiple episodes of nausea and vomiting unable to hold on by mouth including medications. She also complained of worsening abdominal pain. She was further worked up with a CT scan of abdomen and pelvis that showed a stable annular constricting mass involving the gastric body consistent with primary gastric carcinoma. Radiology reported presence of a mid to distal small bowel obstruction. She was placed on bowel rest, made nothing by mouth given IV fluids and IV narcotic analgesics for pain management. She was seen and evaluated by Dr Benay Spice of Oncology.    Nausea/vomiting/Abdominal Pain -Case was discussed with Dr. Benay Spice of medical oncology, we believe that this likely secondary to mid to distal small bowel obstruction which could possibly be caused by a peritoneal nodule.  -On 03/24/2015 patient reporting increasing abdominal pain over the epigastric region. She has not had further episodes of nausea and vomiting. -She had an per GI series that showed irregular narrowing of the body region of the stomach consistent with gastric cancer. Radiology reporting that there is no complete obstruction or perforation. Radiology also reported delayed emptying of the stomach likely due to gastroparesis/diffuse ileus -On 03/25/2015 spoke with Dr Benay Spice, may start radiation therapy to reduce tumor size, this will be determined this Thursday on hospital follow up visit.  -She did not have further episodes of N/V and was having bowel movements, was  discharged on 03/25/2015  2. Right hydroureteronephrosis -CT scan done on admission did reveal the presence  of an increase right hydroureteronephrosis with a transition point in the right pelvis next to the right iliac surgical clips. The she may have a ureteral stricture. Radiology did not report obstructing mass or calculus. -Her kidney function remains stable -Case was discussed with Dr Matilde Sprang of urology. He stated that in this case with a patient having history of metastatic gastric cancer presenting with asymptomatic right hydronephrosis with normal creatinine, stenting would not be recommended.  -Will continue to monitor BMP.   3. Acute anemia -Labs showing a downward trend in hemoglobin from 8.8 on admission to 6.6 this morning -I think this likely resulting from slow, chronic blood loss from malignancy. She is also on anticoagulation for history of pulmonary embolism. -She was typed and cross and will transfuse 2 units of packed red blood cells. -Hg remains stable -Will continue to follow CBC  4. Stage IV gastric cancer.  -She is currently receiving care at the [facility], undergoing treatment with Taxol/ Ramucirumab last treatment on 03/13/2015 -CT scan of abdomen and pelvis showing stable annular constricting mass around gastric body, presence of peritoneal metastatic disease, increased mesenteric soft tissue nodules, mild increase in sclerotic bone metastasis and spine and pelvis. -Dr. Benay Spice of oncology following  5. History of pulmonary embolism -INR remains therapeutic at 2.2 on day of discharge -Please follow up on INR on hospital follow up appoint this Thursday.   6. Hypokalemia. -Her Kdur dose was increased to 40 meq po q daily -Please follow up on BMP on hospital follow up appointment  Consultations:  Oncology  Discharge Exam: Filed Vitals:   03/25/15 0653  BP: 149/95  Pulse: 101  Temp: 98.6 F (37 C)  Resp: 16     General: Patient appears stable, no acute distress, states not vomiting this morning.  Cardiovascular: regular rate and rhythm normal  S1-S2 no murmurs rubs or gallops  Respiratory: normal respiratory effort  Abdomen: improvement to abdominal exam overall soft having mild generalized tenderness, palpable mass over periumbilical region  Musculoskeletal: no edema   Discharge Instructions   Discharge Instructions    Call MD for:  difficulty breathing, headache or visual disturbances    Complete by:  As directed      Call MD for:  extreme fatigue    Complete by:  As directed      Call MD for:  hives    Complete by:  As directed      Call MD for:  persistant dizziness or light-headedness    Complete by:  As directed      Call MD for:  persistant nausea and vomiting    Complete by:  As directed      Call MD for:  redness, tenderness, or signs of infection (pain, swelling, redness, odor or green/yellow discharge around incision site)    Complete by:  As directed      Call MD for:  severe uncontrolled pain    Complete by:  As directed      Call MD for:  temperature >100.4    Complete by:  As directed      Call MD for:    Complete by:  As directed      Diet - low sodium heart healthy    Complete by:  As directed      Increase activity slowly    Complete by:  As directed  Current Discharge Medication List    START taking these medications   Details  oxyCODONE (OXY IR/ROXICODONE) 5 MG immediate release tablet Take 1 tablet (5 mg total) by mouth every 6 (six) hours as needed for moderate pain. Qty: 30 tablet, Refills: 0      CONTINUE these medications which have CHANGED   Details  amLODipine (NORVASC) 5 MG tablet Take 1 tablet (5 mg total) by mouth daily. Qty: 30 tablet, Refills: 2   Associated Diagnoses: Elevated BP    potassium chloride SA (K-DUR,KLOR-CON) 20 MEQ tablet Take 2 tablets (40 mEq total) by mouth daily. Qty: 60 tablet, Refills: 0    warfarin (COUMADIN) 4 MG tablet Take 1-1.5 tablets (4-6 mg total) by mouth See admin instructions. 4 mg Sunday Monday Wednsday Friday 6 mg Tuesday  Thursday Saturday Qty: 60 tablet, Refills: 1   Associated Diagnoses: Gastric cancer; PE (pulmonary embolism); Anticoagulated on Coumadin      CONTINUE these medications which have NOT CHANGED   Details  Alum & Mag Hydroxide-Simeth (MAGIC MOUTHWASH) SOLN Take 5 mLs by mouth 4 (four) times daily as needed for mouth pain. Qty: 300 mL, Refills: 1    HYDROmorphone (DILAUDID) 4 MG tablet Take 1-2 tablets (4-8 mg total) by mouth every 4 (four) hours as needed for severe pain. Qty: 120 tablet, Refills: 0   Associated Diagnoses: Gastric cancer    ondansetron (ZOFRAN) 4 MG tablet Take 1 tablet (4 mg total) by mouth every 6 (six) hours. Qty: 30 tablet, Refills: 0    pantoprazole (PROTONIX) 40 MG tablet Take 1 tablet (40 mg total) by mouth 2 (two) times daily. Qty: 60 tablet, Refills: 0    PRESCRIPTION MEDICATION Chemo [facility]    prochlorperazine (COMPAZINE) 10 MG tablet Take 1 tablet (10 mg total) by mouth every 6 (six) hours as needed for nausea or vomiting. Qty: 30 tablet, Refills: 2   Associated Diagnoses: Gastric cancer; Pulmonary nodules/lesions, multiple    sertraline (ZOLOFT) 100 MG tablet Take 1 tablet (100 mg total) by mouth daily. Qty: 30 tablet, Refills: 2   Associated Diagnoses: Depression      STOP taking these medications     diphenoxylate-atropine (LOMOTIL) 2.5-0.025 MG per tablet        Allergies  Allergen Reactions  . Xarelto [Rivaroxaban] Nausea And Vomiting, Palpitations and Other (See Comments)    Headaches and fatigue, numbness in hands/feet and vomitting   Follow-up Information    Follow up with Minerva Ends, MD In 2 weeks.   Specialty:  Family Medicine   Contact information:   [place] [address] [phone]       Follow up with Betsy Coder, MD In 2 days.   Specialty:  Oncology   Contact information:   [address] [place] [address] [phone]        The results of significant diagnostics from this  hospitalization (including imaging, microbiology, ancillary and laboratory) are listed below for reference.    Significant Diagnostic Studies: Dg Abd 1 View  03/20/2015   CLINICAL DATA:  Mid abdominal pain and constipation for 1 week. Patient complains of vomiting 9 times in the last 24 hr. History of gastric cancer with the obstruction.  EXAM: ABDOMEN - 1 VIEW  COMPARISON:  01/15/2015  FINDINGS: The tip of central venous catheter is seen at cavoatrial junction. Lung bases are clear.  The bowel gas pattern is nonspecific/nonobstructive. There is sub pathologic gaseous distension of small and large bowel loops. No  radio-opaque calculi. Again seen is ill-defined sclerotic lesion within the right iliac crest. Surgical clips in the pelvis are noted.  IMPRESSION: Nonspecific bowel gas pattern, without definite evidence of obstruction.  Sclerotic lesion within the right ilium. The concern for bony metastases persists.   Electronically Signed   By: Fidela Salisbury M.D.   On: 03/20/2015 12:38   Ct Abdomen Pelvis W Contrast  03/20/2015   CLINICAL DATA:  Stage IV gastric carcinoma. Intractable nausea and vomiting. Abdominal pain.  EXAM: CT ABDOMEN AND PELVIS WITH CONTRAST  TECHNIQUE: Multidetector CT imaging of the abdomen and pelvis was performed using the standard protocol following bolus administration of intravenous contrast.  CONTRAST:  160mL OMNIPAQUE IOHEXOL 300 MG/ML  SOLN  COMPARISON:  01/08/2015  FINDINGS: Lower Chest: No acute findings.  Hepatobiliary: No liver masses visualized. Mild perihepatic ascites and periportal edema are stable in appearance. Gallbladder is unremarkable.  Pancreas: No mass, inflammatory changes, or other significant abnormality identified.  Spleen:  Within normal limits in size and appearance.  Adrenals:  No masses identified.  Kidneys/Urinary Tract: Moderate right hydronephrosis and ureterectasis is increased since previous study. There is a transition point to nondilated distal  right ureter near surgical clips adjacent to the right iliac vessels, suspicious for a ureteral stricture. No obstructing mass or calculi visualized.  Stomach/Bowel/Peritoneum: Annular constricting mass is again seen involving the body the stomach which shows no significant change, consistent with primary gastric carcinoma. Adjacent soft tissue nodularity in the gastrocolic ligament remains stable, consistent with peritoneal metastatic disease. An irregular soft tissue nodule in the end anterior abdominal wall soft tissues just above the umbilicus measuring 2.5 cm is stable and suspicious for neoplasm. Several mesenteric peritoneal nodules are seen in the right lower quadrant which are increased in size. Largest on image 55 measures 19 mm, compared with 11 mm previously. This is consistent with peritoneal metastatic disease. Mild ascites shows mild decrease since previous study.  Moderately dilated small bowel loops are again seen with a transition point in the right lower quadrant mesentery at the site of the peritoneal nodule described above. This is consistent with a mid to distal small bowel obstruction.  Vascular/Lymphatic: No pathologically enlarged lymph nodes identified. No abdominal aortic aneurysm or other significant retroperitoneal abnormality demonstrated.  Reproductive: Prior hysterectomy noted. Adnexal regions are unremarkable in appearance.  Other:  None.  Musculoskeletal: Several small sclerotic bone lesions are seen in the spine and pelvis, a few which are new or mildly increased in size since previous study, consistent with sclerotic bone metastases.  IMPRESSION: Stable annular constricting mass involving the gastric body, consistent with primary gastric carcinoma. Stable adjacent soft tissue nodularity in gastrocolic ligament and anterior abdominal wall soft tissues, consistent with peritoneal metastatic disease.  Increased mesenteric soft tissue nodules in the right lower quadrant, also  consistent with peritoneal metastatic disease. This results and a mid to distal small bowel obstruction.  Mild ascites has decreased since previous study.  Mild increase in sclerotic bone metastases in the spine and pelvis.  Increased right hydroureteronephrosis with transition point in the right pelvis adjacent to right iliac surgical clips. This suggests possibility of ureteral stricture. No obstructing calculus or mass visualized.   Electronically Signed   By: Earle Gell M.D.   On: 03/20/2015 18:24   Dg Duanne Limerick  W/kub  03/24/2015   CLINICAL DATA:  1  EXAM: WATER SOLUBLE UPPER GI SERIES  TECHNIQUE: Single-column upper GI series was performed using water soluble contrast.  CONTRAST:  30mL  OMNIPAQUE IOHEXOL 300 MG/ML  SOLN  COMPARISON:  CT scan 03/20/2015  FLUOROSCOPY TIME:  Radiation Exposure Index (as provided by the fluoroscopic device):  If the device does not provide the exposure index:  Fluoroscopy Time (in minutes and seconds):  2 minutes and 0 seconds  Number of Acquired Images:  FINDINGS: The distal esophagus is grossly normal. Significant irregular narrowing in the body region the stomach due to the patient's known gastric carcinoma. Contrast does get through to the antropyloric region and into the duodenum but is quite slow in progression. Delayed emptying from the stomach into the duodenum may be due to a diffuse ileus or gastroparesis. No obvious mechanical obstruction. No leaking oral contrast to suggest perforation.  IMPRESSION: Irregular narrowing of the body region of the stomach consistent with patient's known gastric cancer. No complete obstruction or perforation.  Delayed emptying of the stomach likely due to diffuse ileus/gastroparesis.   Electronically Signed   By: Marijo Sanes M.D.   On: 03/24/2015 11:15    Microbiology: No results found for this or any previous visit (from the past 240 hour(s)).   Labs: Basic Metabolic Panel:  Recent Labs Lab 03/20/15 1130 03/21/15 0510  03/22/15 0510 03/23/15 1215 03/24/15 0500  NA 137 137 139 139 139  K 3.5 3.5 3.2* 3.2* 2.9*  CL  --  105 107 106 106  CO2 25 26 25 26 28   GLUCOSE 148* 86 74 99 91  BUN 6.5* <5* <5* <5* <5*  CREATININE 0.6 0.56 0.53 0.45 0.39*  CALCIUM 8.8 8.0* 8.1* 8.2* 8.3*   Liver Function Tests:  Recent Labs Lab 03/20/15 1130 03/21/15 0510  AST 13 13*  ALT 7 8*  ALKPHOS 117 96  BILITOT 0.59 0.6  PROT 6.1* 5.3*  ALBUMIN 2.9* 2.6*   No results for input(s): LIPASE, AMYLASE in the last 168 hours. No results for input(s): AMMONIA in the last 168 hours. CBC:  Recent Labs Lab 03/20/15 0955 03/21/15 0510 03/22/15 0510 03/23/15 1215 03/24/15 0500  WBC 7.2 4.9 5.1 6.4 6.3  NEUTROABS 5.0  --   --   --   --   HGB 8.8* 6.6* 9.6* 9.9* 10.0*  HCT 27.9* 21.7* 30.6* 31.5* 31.7*  MCV 89.7 91.6 89.2 89.7 89.5  PLT 439* 290 291 303 313   Cardiac Enzymes: No results for input(s): CKTOTAL, CKMB, CKMBINDEX, TROPONINI in the last 168 hours. BNP: BNP (last 3 results) No results for input(s): BNP in the last 8760 hours.  ProBNP (last 3 results) No results for input(s): PROBNP in the last 8760 hours.  CBG: No results for input(s): GLUCAP in the last 168 hours.     SignedKelvin Cellar  [facility] 03/25/2015, 8:38 AM

## 2015-03-27 ENCOUNTER — Ambulatory Visit (HOSPITAL_BASED_OUTPATIENT_CLINIC_OR_DEPARTMENT_OTHER): Payer: Medicaid Other

## 2015-03-27 ENCOUNTER — Ambulatory Visit (HOSPITAL_BASED_OUTPATIENT_CLINIC_OR_DEPARTMENT_OTHER): Payer: Self-pay | Admitting: Pharmacist

## 2015-03-27 ENCOUNTER — Other Ambulatory Visit (HOSPITAL_BASED_OUTPATIENT_CLINIC_OR_DEPARTMENT_OTHER): Payer: Medicaid Other

## 2015-03-27 ENCOUNTER — Telehealth: Payer: Self-pay | Admitting: Oncology

## 2015-03-27 ENCOUNTER — Ambulatory Visit: Payer: Medicaid Other

## 2015-03-27 ENCOUNTER — Ambulatory Visit (HOSPITAL_BASED_OUTPATIENT_CLINIC_OR_DEPARTMENT_OTHER): Payer: Medicaid Other | Admitting: Oncology

## 2015-03-27 VITALS — BP 152/88 | HR 95 | Temp 97.2°F | Resp 18

## 2015-03-27 VITALS — BP 159/100 | HR 110 | Temp 98.2°F | Resp 19 | Ht 62.0 in | Wt 150.9 lb

## 2015-03-27 DIAGNOSIS — C786 Secondary malignant neoplasm of retroperitoneum and peritoneum: Secondary | ICD-10-CM

## 2015-03-27 DIAGNOSIS — I2699 Other pulmonary embolism without acute cor pulmonale: Secondary | ICD-10-CM | POA: Diagnosis not present

## 2015-03-27 DIAGNOSIS — D6481 Anemia due to antineoplastic chemotherapy: Secondary | ICD-10-CM

## 2015-03-27 DIAGNOSIS — C169 Malignant neoplasm of stomach, unspecified: Secondary | ICD-10-CM

## 2015-03-27 DIAGNOSIS — R112 Nausea with vomiting, unspecified: Secondary | ICD-10-CM | POA: Diagnosis not present

## 2015-03-27 DIAGNOSIS — Z95828 Presence of other vascular implants and grafts: Secondary | ICD-10-CM

## 2015-03-27 LAB — CBC WITH DIFFERENTIAL/PLATELET
BASO%: 0.3 % (ref 0.0–2.0)
BASOS ABS: 0 10*3/uL (ref 0.0–0.1)
EOS ABS: 0.1 10*3/uL (ref 0.0–0.5)
EOS%: 0.9 % (ref 0.0–7.0)
HEMATOCRIT: 36.6 % (ref 34.8–46.6)
HGB: 11.9 g/dL (ref 11.6–15.9)
LYMPH%: 17.3 % (ref 14.0–49.7)
MCH: 28.7 pg (ref 25.1–34.0)
MCHC: 32.5 g/dL (ref 31.5–36.0)
MCV: 88.2 fL (ref 79.5–101.0)
MONO#: 1 10*3/uL — ABNORMAL HIGH (ref 0.1–0.9)
MONO%: 14.2 % — ABNORMAL HIGH (ref 0.0–14.0)
NEUT%: 67.3 % (ref 38.4–76.8)
NEUTROS ABS: 4.7 10*3/uL (ref 1.5–6.5)
Platelets: 330 10*3/uL (ref 145–400)
RBC: 4.15 10*6/uL (ref 3.70–5.45)
RDW: 18.3 % — ABNORMAL HIGH (ref 11.2–14.5)
WBC: 7 10*3/uL (ref 3.9–10.3)
lymph#: 1.2 10*3/uL (ref 0.9–3.3)
nRBC: 0 % (ref 0–0)

## 2015-03-27 LAB — COMPREHENSIVE METABOLIC PANEL (CC13)
ALT: 13 U/L (ref 0–55)
ANION GAP: 10 meq/L (ref 3–11)
AST: 23 U/L (ref 5–34)
Albumin: 2.9 g/dL — ABNORMAL LOW (ref 3.5–5.0)
Alkaline Phosphatase: 233 U/L — ABNORMAL HIGH (ref 40–150)
BILIRUBIN TOTAL: 1.67 mg/dL — AB (ref 0.20–1.20)
BUN: 4.1 mg/dL — AB (ref 7.0–26.0)
CO2: 26 meq/L (ref 22–29)
CREATININE: 0.5 mg/dL — AB (ref 0.6–1.1)
Calcium: 8.7 mg/dL (ref 8.4–10.4)
Chloride: 100 mEq/L (ref 98–109)
GLUCOSE: 82 mg/dL (ref 70–140)
Potassium: 3.2 mEq/L — ABNORMAL LOW (ref 3.5–5.1)
Sodium: 137 mEq/L (ref 136–145)
Total Protein: 6.1 g/dL — ABNORMAL LOW (ref 6.4–8.3)

## 2015-03-27 LAB — PROTIME-INR
INR: 3.5 (ref 2.00–3.50)
Protime: 42 Seconds — ABNORMAL HIGH (ref 10.6–13.4)

## 2015-03-27 LAB — POCT INR: INR: 3.5

## 2015-03-27 MED ORDER — HEPARIN SOD (PORK) LOCK FLUSH 100 UNIT/ML IV SOLN
500.0000 [IU] | Freq: Once | INTRAVENOUS | Status: AC
  Administered 2015-03-27: 500 [IU] via INTRAVENOUS
  Filled 2015-03-27: qty 5

## 2015-03-27 MED ORDER — MORPHINE SULFATE 4 MG/ML IJ SOLN
2.0000 mg | Freq: Once | INTRAMUSCULAR | Status: AC
  Administered 2015-03-27: 2 mg via INTRAVENOUS

## 2015-03-27 MED ORDER — SODIUM CHLORIDE 0.9 % IJ SOLN
10.0000 mL | INTRAMUSCULAR | Status: DC | PRN
  Administered 2015-03-27: 10 mL via INTRAVENOUS
  Filled 2015-03-27: qty 10

## 2015-03-27 MED ORDER — MORPHINE SULFATE 4 MG/ML IJ SOLN
INTRAMUSCULAR | Status: AC
  Filled 2015-03-27: qty 1

## 2015-03-27 NOTE — Patient Instructions (Signed)

## 2015-03-27 NOTE — Progress Notes (Signed)
INR above goal today at 3.5.  H/H=11.9/36.6.  Plt=330.  No medication changes.  No bleeding/bruising.  I saw Alicia [name] in infusion room with interpreter.  She was receiving pain medication.  She was admitted last week for N/V and possible bowel obstruction.  She has had decreased appetite which could attribute to increased INR.  Will hold coumadin today and decrease dose by 15-20% to 4mg  MWF and 2mg  other days.  Will check PT/INR with next appt with rad onc.  Alicia Ly had appt with Dr Benay Spice today also.

## 2015-03-27 NOTE — Progress Notes (Signed)
[place] OFFICE PROGRESS NOTE   Diagnosis: Gastric cancer  INTERVAL HISTORY:   She was admitted 03/20/2015 with nausea and vomiting. An x-ray evaluation was consistent with a small bowel obstruction. Her symptoms improved with bowel rest. She was discharged 03/25/2015. She is having bowel movements and reports no further vomiting. She complains of pain in the subxiphoid region. The pain is relieved with hydromorphone, but this causes somnolence.  Objective:  Vital signs in last 24 hours:  Blood pressure 159/100, pulse 110, temperature 98.2 F (36.8 C), temperature source Oral, resp. rate 19, height 5\' 2"  (1.575 m), weight 150 lb 14.4 oz (68.448 kg), SpO2 97 %.    HEENT: Neck without mass Resp: Lungs clear bilaterally Cardio: Regular rate and rhythm GI: Tender in the mid upper abdomen, no discrete mass, no hepatosplenomegaly Vascular: No leg edema   Lab Results:  Lab Results  Component Value Date   WBC 7.0 03/27/2015   HGB 11.9 03/27/2015   HCT 36.6 03/27/2015   MCV 88.2 03/27/2015   PLT 330 03/27/2015   NEUTROABS 4.7 03/27/2015   PT-INR 3.5  Imaging:  Dg Ugi  W/kub  03/24/2015   CLINICAL DATA:  1  EXAM: WATER SOLUBLE UPPER GI SERIES  TECHNIQUE: Single-column upper GI series was performed using water soluble contrast.  CONTRAST:  2mL OMNIPAQUE IOHEXOL 300 MG/ML  SOLN  COMPARISON:  CT scan 03/20/2015  FLUOROSCOPY TIME:  Radiation Exposure Index (as provided by the fluoroscopic device):  If the device does not provide the exposure index:  Fluoroscopy Time (in minutes and seconds):  2 minutes and 0 seconds  Number of Acquired Images:  FINDINGS: The distal esophagus is grossly normal. Significant irregular narrowing in the body region the stomach due to the patient's known gastric carcinoma. Contrast does get through to the antropyloric region and into the duodenum but is quite slow in progression. Delayed emptying from the stomach into the duodenum may be due  to a diffuse ileus or gastroparesis. No obvious mechanical obstruction. No leaking oral contrast to suggest perforation.  IMPRESSION: Irregular narrowing of the body region of the stomach consistent with patient's known gastric cancer. No complete obstruction or perforation.  Delayed emptying of the stomach likely due to diffuse ileus/gastroparesis.   Electronically Signed   By: Marijo Sanes M.D.   On: 03/24/2015 11:15    Medications: I have reviewed the patient's current medications.  Assessment/Plan: 1. Stage IV gastric cancer  Status post bilateral oophorectomy, total abdominal hysterectomy, omentectomy, resection of abdominal wall and pelvic masses, bilateral pelvic lymphadenectomy 10/18/2013 with the pathology confirming moderate to poorly differentiated adenocarcinoma  Stomach biopsy 11/28/2013 revealed adenocarcinoma  Status post 12 cycles of FOLFOX, last given 04/25/2014  Restaging CT 04/29/2014 with thickening of the stomach and nodular densities adjacent to the greater curvature-unchanged, faint groundglass nodularity in the lungs-nonspecific  Restaging CT 07/22/2014 with indeterminate lung nodules; moderate wall thickening in the body region of the stomach; fairly extensive omental disease and findings suspicious for abdominal wall and incisional disease.  Started systemic chemotherapy with FOLFIRI on 07/30/14.  Restaging CT evaluation 10/14/2014 following 5 cycles of FOLFIRI showed decreased nodularity in both lungs; overall stable omental nodularity; involution of the previously demonstrated ill-defined soft tissue mass involving the anterior abdominal wall incision.  Cycle 6 FOLFIRI 10/15/2014  Cycle 7 FOLFIRI 10/29/2014  Cycle 8 FOLFIRI 11/12/2014  Cycle 9 FOLFIRI 11/26/2014 (dose reduction of 5-fluorouracil due to mucositis)  CT abdomen/pelvis 12/05/2014 revealed mild progression of peritoneal/omental tumor  Initiation of weekly Taxol (3 weeks on/1 week off) and every  2 week Ramucirumab 12/17/2014  CT abdomen/pelvis 01/08/2015 with findings worrisome for distal small bowel obstruction presumed secondary to adhesions. Apparent right-sided ureteral obstruction possibly secondary to developing adhesions; interval development of a small amount of intra-abdominal ascites. Grossly unchanged peritoneal carcinomatosis. Unchanged 1.4 cm sclerotic osseous metastasis within the right ilium.  Cycle 2 Taxol/Ramucirumab beginning 01/16/2015  Cycle 3 Taxol/ Ramucirumab beginning 02/13/2015  Cycle 4 Taxol/ Ramucirumab beginning 03/13/2015  CT abdomen and pelvis 03/20/2015-unchanged constricting tumor in the body of the stomach, increased mesenteric peritoneal nodules, moderately dilated small bowel loops with a transition point in the right lower quadrant mesentery, sclerotic bone metastases   2. Oxaliplatin neuropathy   3. Bilateral upper and lower lobe pulmonary emboli within segmental branches-now maintained on Coumadin. Followed by [facility] clinic at the [facility].   4. Neutropenia related to chemotherapy 08/13/2014. Cycle 2 FOLFIRI held. Neulasta added beginning with cycle 2 on 08/20/2014.   5. Nausea following chemotherapy-the anti-emetic regimen was adjusted with cycle 4 FOLFIRI 09/17/2014   6. Diarrhea following FOLFIRI-no improvement with Imodium. Lomotil initiated 10/15/2014.    7. Mouth sores following cycle 8 FOLFIRI. 5 fluorouracil dose reduced.   8. Admission 12/05/2014 with nausea/vomiting and diarrhea-likely secondary to FOLFIRI chemotherapy   9. Abdominal pain secondary to carcinomatosis and gastric tumor-progressive   10. 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 Admission 01/09/2015 with abdominal pain and nausea/vomiting-a CT confirmed a distal small bowel obstruction. Resolved with bowel rest.   12. Depression. She is currently on Zoloft 100 mg  daily.   13. Elevated blood pressure. Currently on Norvasc 5 mg daily.   14. Admission 03/20/2015 with nausea/vomiting-CT consistent with a small bowel obstruction, likely related to peritoneal tumor    Disposition:  Mr. Varin has clinical evidence of disease progression despite Taxol and ramucirumab. We decided to discontinue this regimen. I will refer her to consider palliative radiation to the stomach. We can deliver sensitizing infusional 5-fluorouracil if she begins radiation.  She will continue Coumadin anticoagulation with close monitoring of the PT/INR. She will return for an office visit as scheduled on 04/10/2015.  Betsy Coder, MD  03/27/2015  10:48 AM

## 2015-03-27 NOTE — Telephone Encounter (Signed)
per [facility] w/Dr Kateri Mc copy of avs

## 2015-03-28 ENCOUNTER — Telehealth: Payer: Self-pay | Admitting: *Deleted

## 2015-03-28 NOTE — Telephone Encounter (Signed)
-----   Message from Ladell Pier, MD sent at 03/27/2015  9:25 PM EDT ----- Please call patient, be sure she is taking kcl, repeat PT and cmet 1 week

## 2015-03-28 NOTE — Telephone Encounter (Signed)
Per Dr. Benay Spice; through the interpreter Gregary Signs; pt informed that Dr. Lisbeth Renshaw appt moved up to 04/02/15 @ 2:30; asked if pt is taking her kcl---per Gregary Signs "pt is picking up meds tomorrow and will take"  Emphasized importance of taking kcl consistently (2 tablets daily).  Per Gregary Signs; pt verbalized understanding of all information and Gregary Signs will be texting pt upcoming appt date/time.

## 2015-04-01 NOTE — Progress Notes (Signed)
GI Location of Tumor / Histology: Gastric Cancer Progressive  Alicia [name] presented   Hospital admit 03/20/15  with symptoms of: Nausea,vomiting,    Biopsies of  (if applicable) revealed: last Biopsy done 11/28/13 =stomach biopsy=Adenocarcinoma 10/28/13 Biopsy Ovaries,,fallopian tube ,peritoneal cyst,excusion ,Omentum, right pelvic nodule=,=mod to poorly diff adenocarcinoma also small bowel tumor=small intestine with mod to poorly diff  Adenocarcinoma involving the full thickness of the intestinal wall and adjacent mesenteric tissue  Done at [facility]. )  Past/Anticipated interventions by surgeon, if anyTotal Hysterectomy&B/L oophorectomy 10/18/13,   Past/Anticipated interventions by medical oncology, if any: Has had Chemotherapy Folfox ,last given 04/25/14, Started Folfiri on 07/30/14,   Cyle 9 Folfiri 11/26/14 dose reduction of 5-fu , Initiation of weekly Taxol (3 weeks on,1 week off) and every 2 week Ramucirumab on 12/17/14,  Cycle 4 beginning 03/13/15  D/C , refer palliative radiation to the stomach   Weight changes, if any: 34lbs loss past 3 months  Bowel/Bladder complaints, if any: SBO 03/20/15 dx in hospital  Resolved with rest, ,   Nausea / Vomiting, if any: Nausea,took zofran  8am    Pain issues, if any:  8/10 abdomen, took Dilaudid 4mg  1 tablet   3pm,  Started to ease off a little stated  Having constipation at times, last bowel movement today , passing a lot of gas SAFETY ISSUES: yes, weakness   Prior radiation?NO  Pacemaker/ICD? NO  Possible current pregnancy? NO  Is the patient on methotrexate? NO  Married, G3P3 menses age 25, age 2st childbirth=14,HB/L pulmonary emboli maintained with Coumadin, Depression,   Denies alcohol or tobacco use, no illicit drug use, Mother died heart disease,Father died of an accident,no family hx cancer    Allergies: Xaraelto= Vomiting, numbness hands & feet, h/a BP 146/108 mmHg  Pulse 116   Temp(Src) 97.9 F (36.6 C) (Oral)  Resp 20  Ht 5\' 2"  (1.575 m)  Wt 142 lb 9.6 oz (64.683 kg)  BMI 26.08 kg/m2  SpO2 100%  Wt Readings from Last 3 Encounters:  04/02/15 142 lb 9.6 oz (64.683 kg)  03/27/15 150 lb 14.4 oz (68.448 kg)  03/20/15 141 lb 11.2 oz (64.275 kg)   Interpreter Alicia [name] in with patient and spouse

## 2015-04-02 ENCOUNTER — Ambulatory Visit
Admission: RE | Admit: 2015-04-02 | Discharge: 2015-04-02 | Disposition: A | Discharge status: Home / Self Care | Payer: Medicaid Other | Source: Ambulatory Visit | Attending: Radiation Oncology | Admitting: Radiation Oncology

## 2015-04-02 ENCOUNTER — Encounter: Payer: Self-pay | Admitting: Radiation Oncology

## 2015-04-02 VITALS — BP 146/108 | HR 116 | Temp 97.9°F | Resp 20 | Ht 62.0 in | Wt 142.6 lb

## 2015-04-02 DIAGNOSIS — Z90710 Acquired absence of both cervix and uterus: Secondary | ICD-10-CM | POA: Diagnosis not present

## 2015-04-02 DIAGNOSIS — Z8249 Family history of ischemic heart disease and other diseases of the circulatory system: Secondary | ICD-10-CM | POA: Diagnosis not present

## 2015-04-02 DIAGNOSIS — Z86711 Personal history of pulmonary embolism: Secondary | ICD-10-CM | POA: Insufficient documentation

## 2015-04-02 DIAGNOSIS — Z9221 Personal history of antineoplastic chemotherapy: Secondary | ICD-10-CM | POA: Insufficient documentation

## 2015-04-02 DIAGNOSIS — Z833 Family history of diabetes mellitus: Secondary | ICD-10-CM | POA: Insufficient documentation

## 2015-04-02 DIAGNOSIS — Z7901 Long term (current) use of anticoagulants: Secondary | ICD-10-CM | POA: Insufficient documentation

## 2015-04-02 DIAGNOSIS — C169 Malignant neoplasm of stomach, unspecified: Secondary | ICD-10-CM | POA: Diagnosis not present

## 2015-04-02 DIAGNOSIS — Z510 Encounter for antineoplastic radiation therapy: Secondary | ICD-10-CM | POA: Diagnosis not present

## 2015-04-02 NOTE — Progress Notes (Signed)
Radiation Oncology         [phone] ________________________________  Name: [name] MRN: [id number]  Date: 04/02/2015  DOB: [birth year]  UX:[id number], Alicia Laity, MD  Ladell Pier, MD     REFERRING PHYSICIAN: Ladell Pier, MD   DIAGNOSIS: The encounter diagnosis was Gastric cancer.:  Stage IV   HISTORY OF PRESENT ILLNESS::Alicia [name] is a 54 y.o. female who is seen for an initial consultation visit. A medical interpreter is present during this encounter.  She has a history of metastatic gastric cancer dating back to last year in 2015. The patient initially presented with stage IV gastric cancer and has undergone multiple courses of chemotherapy since her initial diagnosis. The patient unfortunately has had some evidence of progression recently.  [name] was admitted to the hospital on 03/20/15 with symptoms of nausea and vomiting.   Biopsies of (if applicable) revealed: last biopsy done 11/28/13 =stomach biopsy=Adenocarcinoma  10/28/13 Biopsy Ovaries, fallopian tube, peritoneal cyst, excusion, Omentum, right pelvic nodule=,=mod to poorly diff adenocarcinoma also small bowel tumor=small intestine with mod to poorly diff Adenocarcinoma involving the full thickness of the intestinal wall and adjacent mesenteric tissue. Done at [facility].  Past/Anticipated interventions by surgeon, if anyTotal Hysterectomy&B/L oophorectomy 10/18/13,  Past/Anticipated interventions by medical oncology, if any: Has had Chemotherapy Folfox. Last given 04/25/14. Started Folfiri on 07/30/14, Cyle 9 Folfiri 11/26/14 dose reduction of 5-fu , Initiation of weekly Taxol (3 weeks on,1 week off) and every 2 week Ramucirumab on 12/17/14, Cycle 4 beginning 03/13/15 D/C , refer palliative radiation to the stomach.  Weight changes, if any: 34lbs loss past 3 months  Bowel/Bladder complaints, if any: Small bowel obstruction 03/20/15 dx in hospital. Resolved with  rest.  Nausea / Vomiting, if any: Nausea. Took zofran 8am Pain issues, if any: 8/10 abdomen, took Dilaudid 4mg  with 1 tablet at 3pm. Started to ease off a little stated  Having constipation at times, last bowel movement today with passing a lot of gas  PREVIOUS RADIATION THERAPY: No   PAST MEDICAL HISTORY:  has a past medical history of Cancer (Dx Mar 5,2015); PE (pulmonary embolism) (05/17/2014); Gastric cancer (06/06/2014); and Constipation (09/04/2014).     PAST SURGICAL HISTORY: Past Surgical History  Procedure Laterality Date  . Abdominal hysterectomy  10/18/2013      FAMILY HISTORY: family history includes Diabetes in her sister; Heart disease in her brother, mother, and sister.   SOCIAL HISTORY:  reports that she has never smoked. She has never used smokeless tobacco. She reports that she does not drink alcohol or use illicit drugs.   ALLERGIES: Xarelto   MEDICATIONS:  Current Outpatient Prescriptions  Medication Sig Dispense Refill  . Alum & Mag Hydroxide-Simeth (MAGIC MOUTHWASH) SOLN Take 5 mLs by mouth 4 (four) times daily as needed for mouth pain. 300 mL 1  . amLODipine (NORVASC) 5 MG tablet Take 1 tablet (5 mg total) by mouth daily. 30 tablet 2  . HYDROmorphone (DILAUDID) 4 MG tablet Take 1-2 tablets (4-8 mg total) by mouth every 4 (four) hours as needed for severe pain. 120 tablet 0  . ondansetron (ZOFRAN) 4 MG tablet Take 1 tablet (4 mg total) by mouth every 6 (six) hours. 30 tablet 0  . pantoprazole (PROTONIX) 40 MG tablet Take 1 tablet (40 mg total) by mouth 2 (two) times daily. 60 tablet 0  . potassium chloride SA (K-DUR,KLOR-CON) 20 MEQ tablet Take 2 tablets (40 mEq total) by mouth daily. [place]  tablet 0  . prochlorperazine (COMPAZINE) 10 MG tablet Take 1 tablet (10 mg total) by mouth every 6 (six) hours as needed for nausea or vomiting. 30 tablet 2  . sertraline (ZOLOFT) 100 MG tablet Take 1 tablet (100 mg total) by mouth daily. 30 tablet 2  . warfarin (COUMADIN) 4 MG  tablet Take 1-1.5 tablets (4-6 mg total) by mouth See admin instructions. 4 mg Sunday Monday Wednsday Friday 6 mg Tuesday Thursday Saturday 60 tablet 1  . oxyCODONE (OXY IR/ROXICODONE) 5 MG immediate release tablet Take 1 tablet (5 mg total) by mouth every 6 (six) hours as needed for moderate pain. (Patient not taking: Reported on 04/02/2015) 30 tablet 0  . [DISCONTINUED] rivaroxaban (XARELTO) 20 MG TABS tablet Take 1 tablet (20 mg total) by mouth daily with supper. 30 tablet 1   No current facility-administered medications for this encounter.   Facility-Administered Medications Ordered in Other Encounters  Medication Dose Route Frequency Provider Last Rate Last Dose  . sodium chloride 0.9 % injection 10 mL  10 mL Intracatheter PRN Ladell Pier, MD      . sodium chloride 0.9 % injection 10 mL  10 mL Intravenous PRN Ladell Pier, MD   10 mL at 01/16/15 1126     REVIEW OF SYSTEMS:  A 15 point review of systems is documented in the electronic medical record. This was obtained by the nursing staff. However, I reviewed this with the patient to discuss relevant findings and make appropriate changes.  Pertinent items are noted in HPI.    PHYSICAL EXAM:  height is 5\' 2"  (1.575 m) and weight is 142 lb 9.6 oz (64.683 kg). Her oral temperature is 97.9 F (36.6 C). Her blood pressure is 146/108 and her pulse is 116. Her respiration is 20 and oxygen saturation is 100%.  The pt presents to the clinic in a wheelchair. Heart has regular rate and rhythm. Chest clear to auscultation bilaterally. Tenderness to palpation in the epigastric region  ECOG = 2  0 - Asymptomatic (Fully active, able to carry on all predisease activities without restriction)  1 - Symptomatic but completely ambulatory (Restricted in physically strenuous activity but ambulatory and able to carry out work of a light or sedentary nature. For example, light housework, office work)  2 - Symptomatic, <50% in bed during the day  (Ambulatory and capable of all self care but unable to carry out any work activities. Up and about more than 50% of waking hours)  3 - Symptomatic, >50% in bed, but not bedbound (Capable of only limited self-care, confined to bed or chair 50% or more of waking hours)  4 - Bedbound (Completely disabled. Cannot carry on any self-care. Totally confined to bed or chair)  5 - Death   Eustace Pen MM, Creech RH, Tormey DC, et al. [phone]). "Toxicity and response criteria of the [facility]". [place] Oncol. 5 (6): 649-55     LABORATORY DATA:  Lab Results  Component Value Date   WBC 7.0 03/27/2015   HGB 11.9 03/27/2015   HCT 36.6 03/27/2015   MCV 88.2 03/27/2015   PLT 330 03/27/2015   Lab Results  Component Value Date   NA 137 03/27/2015   K 3.2* 03/27/2015   CL 103 03/25/2015   CO2 26 03/27/2015   Lab Results  Component Value Date   ALT 13 03/27/2015   AST 23 03/27/2015   ALKPHOS 233* 03/27/2015   BILITOT 1.67* 03/27/2015  RADIOGRAPHY: Dg Abd 1 View  03/20/2015   CLINICAL DATA:  Mid abdominal pain and constipation for 1 week. Patient complains of vomiting 9 times in the last 24 hr. History of gastric cancer with the obstruction.  EXAM: ABDOMEN - 1 VIEW  COMPARISON:  01/15/2015  FINDINGS: The tip of central venous catheter is seen at cavoatrial junction. Lung bases are clear.  The bowel gas pattern is nonspecific/nonobstructive. There is sub pathologic gaseous distension of small and large bowel loops. No radio-opaque calculi. Again seen is ill-defined sclerotic lesion within the right iliac crest. Surgical clips in the pelvis are noted.  IMPRESSION: Nonspecific bowel gas pattern, without definite evidence of obstruction.  Sclerotic lesion within the right ilium. The concern for bony metastases persists.   Electronically Signed   By: Fidela Salisbury M.D.   On: 03/20/2015 12:38   Ct Abdomen Pelvis W Contrast  03/20/2015   CLINICAL DATA:  Stage IV gastric  carcinoma. Intractable nausea and vomiting. Abdominal pain.  EXAM: CT ABDOMEN AND PELVIS WITH CONTRAST  TECHNIQUE: Multidetector CT imaging of the abdomen and pelvis was performed using the standard protocol following bolus administration of intravenous contrast.  CONTRAST:  150mL OMNIPAQUE IOHEXOL 300 MG/ML  SOLN  COMPARISON:  01/08/2015  FINDINGS: Lower Chest: No acute findings.  Hepatobiliary: No liver masses visualized. Mild perihepatic ascites and periportal edema are stable in appearance. Gallbladder is unremarkable.  Pancreas: No mass, inflammatory changes, or other significant abnormality identified.  Spleen:  Within normal limits in size and appearance.  Adrenals:  No masses identified.  Kidneys/Urinary Tract: Moderate right hydronephrosis and ureterectasis is increased since previous study. There is a transition point to nondilated distal right ureter near surgical clips adjacent to the right iliac vessels, suspicious for a ureteral stricture. No obstructing mass or calculi visualized.  Stomach/Bowel/Peritoneum: Annular constricting mass is again seen involving the body the stomach which shows no significant change, consistent with primary gastric carcinoma. Adjacent soft tissue nodularity in the gastrocolic ligament remains stable, consistent with peritoneal metastatic disease. An irregular soft tissue nodule in the end anterior abdominal wall soft tissues just above the umbilicus measuring 2.5 cm is stable and suspicious for neoplasm. Several mesenteric peritoneal nodules are seen in the right lower quadrant which are increased in size. Largest on image 55 measures 19 mm, compared with 11 mm previously. This is consistent with peritoneal metastatic disease. Mild ascites shows mild decrease since previous study.  Moderately dilated small bowel loops are again seen with a transition point in the right lower quadrant mesentery at the site of the peritoneal nodule described above. This is consistent with a  mid to distal small bowel obstruction.  Vascular/Lymphatic: No pathologically enlarged lymph nodes identified. No abdominal aortic aneurysm or other significant retroperitoneal abnormality demonstrated.  Reproductive: Prior hysterectomy noted. Adnexal regions are unremarkable in appearance.  Other:  None.  Musculoskeletal: Several small sclerotic bone lesions are seen in the spine and pelvis, a few which are new or mildly increased in size since previous study, consistent with sclerotic bone metastases.  IMPRESSION: Stable annular constricting mass involving the gastric body, consistent with primary gastric carcinoma. Stable adjacent soft tissue nodularity in gastrocolic ligament and anterior abdominal wall soft tissues, consistent with peritoneal metastatic disease.  Increased mesenteric soft tissue nodules in the right lower quadrant, also consistent with peritoneal metastatic disease. This results and a mid to distal small bowel obstruction.  Mild ascites has decreased since previous study.  Mild increase in sclerotic bone metastases in the  spine and pelvis.  Increased right hydroureteronephrosis with transition point in the right pelvis adjacent to right iliac surgical clips. This suggests possibility of ureteral stricture. No obstructing calculus or mass visualized.   Electronically Signed   By: Earle Gell M.D.   On: 03/20/2015 18:24   Dg Duanne Limerick  W/kub  03/24/2015   CLINICAL DATA:  1  EXAM: WATER SOLUBLE UPPER GI SERIES  TECHNIQUE: Single-column upper GI series was performed using water soluble contrast.  CONTRAST:  38mL OMNIPAQUE IOHEXOL 300 MG/ML  SOLN  COMPARISON:  CT scan 03/20/2015  FLUOROSCOPY TIME:  Radiation Exposure Index (as provided by the fluoroscopic device):  If the device does not provide the exposure index:  Fluoroscopy Time (in minutes and seconds):  2 minutes and 0 seconds  Number of Acquired Images:  FINDINGS: The distal esophagus is grossly normal. Significant irregular narrowing in the body  region the stomach due to the patient's known gastric carcinoma. Contrast does get through to the antropyloric region and into the duodenum but is quite slow in progression. Delayed emptying from the stomach into the duodenum may be due to a diffuse ileus or gastroparesis. No obvious mechanical obstruction. No leaking oral contrast to suggest perforation.  IMPRESSION: Irregular narrowing of the body region of the stomach consistent with patient's known gastric cancer. No complete obstruction or perforation.  Delayed emptying of the stomach likely due to diffuse ileus/gastroparesis.   Electronically Signed   By: Marijo Sanes M.D.   On: 03/24/2015 11:15   IMPRESSION: Metastatic gastric cancer with some sign of progression on recent scan. Pt continues to have some stomach pain and had a recent bowel obstruction.  PLAN: Radiation will be focussed to the tumor in the stomach and a tumor deposit inferiorly at the point of a bowel obstruction. I explained the process of CT simulation and the placement of tattoos. I expect her to have approximately 3 weeks of radiotherapy concurrent with chemotherapy. I explained side effects of radiotherapy that may arise including, but not limited to: nausea, diarrhea, and fatigue. The pt signed a consent form and this was placed in her medical chart. CT sim is scheduled on this Friday at [place].  I spen45 minutes face to face with the patient and more than 50% of that time was spent in counseling and/or coordination of care.  This document serves as a record of services personally performed by Kyung Rudd, MD. It was created on his behalf by Darcus Austin, a trained medical scribe. The creation of this record is based on the scribe's personal observations and the provider's statements to them. This document has been checked and approved by the attending provider.    ________________________________   Jodelle Gross, MD, PhD

## 2015-04-02 NOTE — Progress Notes (Signed)
Please see the Nurse Progress Note in the MD Initial Consult Encounter for this patient. 

## 2015-04-04 ENCOUNTER — Ambulatory Visit
Admission: RE | Admit: 2015-04-04 | Discharge: 2015-04-04 | Disposition: A | Discharge status: Home / Self Care | Payer: Medicaid Other | Source: Ambulatory Visit | Attending: Radiation Oncology | Admitting: Radiation Oncology

## 2015-04-04 DIAGNOSIS — C169 Malignant neoplasm of stomach, unspecified: Secondary | ICD-10-CM

## 2015-04-04 DIAGNOSIS — Z510 Encounter for antineoplastic radiation therapy: Secondary | ICD-10-CM | POA: Diagnosis not present

## 2015-04-06 ENCOUNTER — Other Ambulatory Visit: Payer: Self-pay | Admitting: Oncology

## 2015-04-07 ENCOUNTER — Other Ambulatory Visit: Payer: Self-pay

## 2015-04-07 ENCOUNTER — Ambulatory Visit: Payer: Self-pay

## 2015-04-07 ENCOUNTER — Ambulatory Visit: Payer: MEDICAID | Admitting: Radiation Oncology

## 2015-04-07 ENCOUNTER — Ambulatory Visit: Payer: MEDICAID

## 2015-04-07 DIAGNOSIS — Z510 Encounter for antineoplastic radiation therapy: Secondary | ICD-10-CM | POA: Diagnosis not present

## 2015-04-09 ENCOUNTER — Ambulatory Visit
Admission: RE | Admit: 2015-04-09 | Discharge: 2015-04-09 | Disposition: A | Discharge status: Home / Self Care | Payer: Medicaid Other | Source: Ambulatory Visit | Attending: Radiation Oncology | Admitting: Radiation Oncology

## 2015-04-09 DIAGNOSIS — Z510 Encounter for antineoplastic radiation therapy: Secondary | ICD-10-CM | POA: Diagnosis not present

## 2015-04-10 ENCOUNTER — Other Ambulatory Visit (HOSPITAL_BASED_OUTPATIENT_CLINIC_OR_DEPARTMENT_OTHER): Payer: Medicaid Other

## 2015-04-10 ENCOUNTER — Encounter: Payer: Self-pay | Admitting: *Deleted

## 2015-04-10 ENCOUNTER — Ambulatory Visit (HOSPITAL_BASED_OUTPATIENT_CLINIC_OR_DEPARTMENT_OTHER): Payer: Medicaid Other | Admitting: Oncology

## 2015-04-10 ENCOUNTER — Ambulatory Visit
Admission: RE | Admit: 2015-04-10 | Discharge: 2015-04-10 | Disposition: A | Discharge status: Home / Self Care | Payer: Medicaid Other | Source: Ambulatory Visit | Attending: Radiation Oncology | Admitting: Radiation Oncology

## 2015-04-10 ENCOUNTER — Ambulatory Visit (HOSPITAL_BASED_OUTPATIENT_CLINIC_OR_DEPARTMENT_OTHER): Payer: Medicaid Other

## 2015-04-10 ENCOUNTER — Other Ambulatory Visit: Payer: Self-pay | Admitting: *Deleted

## 2015-04-10 ENCOUNTER — Telehealth: Payer: Self-pay | Admitting: *Deleted

## 2015-04-10 ENCOUNTER — Ambulatory Visit (HOSPITAL_BASED_OUTPATIENT_CLINIC_OR_DEPARTMENT_OTHER): Payer: Self-pay | Admitting: Pharmacist

## 2015-04-10 ENCOUNTER — Other Ambulatory Visit: Payer: Self-pay | Admitting: Nurse Practitioner

## 2015-04-10 ENCOUNTER — Telehealth: Payer: Self-pay | Admitting: Oncology

## 2015-04-10 VITALS — BP 173/108 | HR 107

## 2015-04-10 VITALS — BP 145/109 | HR 123 | Temp 98.4°F | Resp 20 | Ht 62.0 in | Wt 134.5 lb

## 2015-04-10 DIAGNOSIS — Z5111 Encounter for antineoplastic chemotherapy: Secondary | ICD-10-CM | POA: Diagnosis not present

## 2015-04-10 DIAGNOSIS — Z7901 Long term (current) use of anticoagulants: Secondary | ICD-10-CM

## 2015-04-10 DIAGNOSIS — C786 Secondary malignant neoplasm of retroperitoneum and peritoneum: Secondary | ICD-10-CM

## 2015-04-10 DIAGNOSIS — Z5181 Encounter for therapeutic drug level monitoring: Secondary | ICD-10-CM

## 2015-04-10 DIAGNOSIS — R1110 Vomiting, unspecified: Secondary | ICD-10-CM | POA: Diagnosis not present

## 2015-04-10 DIAGNOSIS — I2699 Other pulmonary embolism without acute cor pulmonale: Secondary | ICD-10-CM

## 2015-04-10 DIAGNOSIS — C169 Malignant neoplasm of stomach, unspecified: Secondary | ICD-10-CM | POA: Diagnosis present

## 2015-04-10 DIAGNOSIS — Z95828 Presence of other vascular implants and grafts: Secondary | ICD-10-CM

## 2015-04-10 DIAGNOSIS — Z510 Encounter for antineoplastic radiation therapy: Secondary | ICD-10-CM | POA: Diagnosis not present

## 2015-04-10 DIAGNOSIS — E860 Dehydration: Secondary | ICD-10-CM

## 2015-04-10 LAB — CBC WITH DIFFERENTIAL/PLATELET
BASO%: 0.7 % (ref 0.0–2.0)
Basophils Absolute: 0.1 10*3/uL (ref 0.0–0.1)
EOS%: 0.3 % (ref 0.0–7.0)
Eosinophils Absolute: 0 10*3/uL (ref 0.0–0.5)
HCT: 38.5 % (ref 34.8–46.6)
HGB: 12.6 g/dL (ref 11.6–15.9)
LYMPH%: 12 % — AB (ref 14.0–49.7)
MCH: 29.2 pg (ref 25.1–34.0)
MCHC: 32.6 g/dL (ref 31.5–36.0)
MCV: 89.4 fL (ref 79.5–101.0)
MONO#: 1.2 10*3/uL — AB (ref 0.1–0.9)
MONO%: 10.1 % (ref 0.0–14.0)
NEUT%: 76.9 % — AB (ref 38.4–76.8)
NEUTROS ABS: 8.9 10*3/uL — AB (ref 1.5–6.5)
PLATELETS: 384 10*3/uL (ref 145–400)
RBC: 4.3 10*6/uL (ref 3.70–5.45)
RDW: 20.9 % — ABNORMAL HIGH (ref 11.2–14.5)
WBC: 11.6 10*3/uL — AB (ref 3.9–10.3)
lymph#: 1.4 10*3/uL (ref 0.9–3.3)

## 2015-04-10 LAB — COMPREHENSIVE METABOLIC PANEL (CC13)
ALT: 29 U/L (ref 0–55)
ANION GAP: 11 meq/L (ref 3–11)
AST: 47 U/L — ABNORMAL HIGH (ref 5–34)
Albumin: 3.1 g/dL — ABNORMAL LOW (ref 3.5–5.0)
Alkaline Phosphatase: 588 U/L — ABNORMAL HIGH (ref 40–150)
BUN: 15.3 mg/dL (ref 7.0–26.0)
CHLORIDE: 98 meq/L (ref 98–109)
CO2: 25 meq/L (ref 22–29)
CREATININE: 0.7 mg/dL (ref 0.6–1.1)
Calcium: 9.5 mg/dL (ref 8.4–10.4)
GLUCOSE: 104 mg/dL (ref 70–140)
Potassium: 4.5 mEq/L (ref 3.5–5.1)
SODIUM: 134 meq/L — AB (ref 136–145)
Total Bilirubin: 1.03 mg/dL (ref 0.20–1.20)
Total Protein: 6.9 g/dL (ref 6.4–8.3)

## 2015-04-10 LAB — PROTIME-INR
INR: 2.4 (ref 2.00–3.50)
PROTIME: 28.8 s — AB (ref 10.6–13.4)

## 2015-04-10 MED ORDER — SODIUM CHLORIDE 0.9 % IV SOLN: INTRAVENOUS | Status: AC

## 2015-04-10 MED ORDER — SODIUM CHLORIDE 0.9 % IV SOLN
INTRAVENOUS | Status: DC
  Administered 2015-04-10: 17:00:00 via INTRAVENOUS

## 2015-04-10 MED ORDER — SODIUM CHLORIDE 0.9 % IJ SOLN
10.0000 mL | INTRAMUSCULAR | Status: DC | PRN
  Administered 2015-04-10: 10 mL via INTRAVENOUS
  Filled 2015-04-10: qty 10

## 2015-04-10 MED ORDER — HYDROMORPHONE HCL 4 MG PO TABS: 4.0000 mg | ORAL_TABLET | ORAL | Status: DC | PRN

## 2015-04-10 MED ORDER — HEPARIN SOD (PORK) LOCK FLUSH 100 UNIT/ML IV SOLN
500.0000 [IU] | Freq: Once | INTRAVENOUS | Status: AC
  Administered 2015-04-10: 500 [IU] via INTRAVENOUS
  Filled 2015-04-10: qty 5

## 2015-04-10 MED ORDER — FLUOROURACIL CHEMO INJECTION 5 GM/100ML
225.0000 mg/m2/d | INTRAVENOUS | Status: DC
  Administered 2015-04-10: 2650 mg via INTRAVENOUS
  Filled 2015-04-10: qty 53

## 2015-04-10 NOTE — Progress Notes (Signed)
Oncology Nurse Navigator Documentation  Oncology Nurse Navigator Flowsheets 04/10/2015  Navigator Encounter Type Treatment  Patient Visit Type Medonc  Treatment Phase Treatment 5FU pump due with RT (started 8/24)  Barriers/Navigation Needs -  Interventions Coordination of Care  Coordination of Care Coordinated chemo pump w/infusion for 4 pm today when she comes in for RT. Coumadin clinic will see her in tx area at 4 pm-made aware she is starting 5FU pump.  Time Spent with Patient [place] reports her medicaid has been approved again.

## 2015-04-10 NOTE — Progress Notes (Signed)
[place] OFFICE PROGRESS NOTE   Diagnosis: Gastric cancer  INTERVAL HISTORY:   She returns as scheduled. She began palliative radiation yesterday. She continues to have intermittent nausea, early satiety, and abdominal pain. She takes Dilaudid for pain. Her oral intake is limited.  Objective:  Vital signs in last 24 hours:  Blood pressure 145/109, pulse 123, temperature 98.4 F (36.9 C), temperature source Oral, resp. rate 20, height 5\' 2"  (1.575 m), weight 134 lb 8 oz (61.009 kg), SpO2 98 %.    Resp: Lungs clear bilaterally Cardio: Tachycardia, regular rate and rhythm GI: Mass surrounding emboli to, tender in the mid and left upper abdomen Vascular: No leg edema   Portacath/PICC-without erythema  Lab Results:  Lab Results  Component Value Date   WBC 11.6* 04/10/2015   HGB 12.6 04/10/2015   HCT 38.5 04/10/2015   MCV 89.4 04/10/2015   PLT 384 04/10/2015   NEUTROABS 8.9* 04/10/2015   PT/INR 2.4  Medications: I have reviewed the patient's current medications.  Assessment/Plan: 1. Stage IV gastric cancer  Status post bilateral oophorectomy, total abdominal hysterectomy, omentectomy, resection of abdominal wall and pelvic masses, bilateral pelvic lymphadenectomy 10/18/2013 with the pathology confirming moderate to poorly differentiated adenocarcinoma  Stomach biopsy 11/28/2013 revealed adenocarcinoma  Status post 12 cycles of FOLFOX, last given 04/25/2014  Restaging CT 04/29/2014 with thickening of the stomach and nodular densities adjacent to the greater curvature-unchanged, faint groundglass nodularity in the lungs-nonspecific  Restaging CT 07/22/2014 with indeterminate lung nodules; moderate wall thickening in the body region of the stomach; fairly extensive omental disease and findings suspicious for abdominal wall and incisional disease.  Started systemic chemotherapy with FOLFIRI on 07/30/14.  Restaging CT evaluation 10/14/2014 following 5  cycles of FOLFIRI showed decreased nodularity in both lungs; overall stable omental nodularity; involution of the previously demonstrated ill-defined soft tissue mass involving the anterior abdominal wall incision.  Cycle 6 FOLFIRI 10/15/2014  Cycle 7 FOLFIRI 10/29/2014  Cycle 8 FOLFIRI 11/12/2014  Cycle 9 FOLFIRI 11/26/2014 (dose reduction of 5-fluorouracil due to mucositis)  CT abdomen/pelvis 12/05/2014 revealed mild progression of peritoneal/omental tumor  Initiation of weekly Taxol (3 weeks on/1 week off) and every 2 week Ramucirumab 12/17/2014  CT abdomen/pelvis 01/08/2015 with findings worrisome for distal small bowel obstruction presumed secondary to adhesions. Apparent right-sided ureteral obstruction possibly secondary to developing adhesions; interval development of a small amount of intra-abdominal ascites. Grossly unchanged peritoneal carcinomatosis. Unchanged 1.4 cm sclerotic osseous metastasis within the right ilium.  Cycle 2 Taxol/Ramucirumab beginning 01/16/2015  Cycle 3 Taxol/ Ramucirumab beginning 02/13/2015  Cycle 4 Taxol/ Ramucirumab beginning 03/13/2015  CT abdomen and pelvis 03/20/2015-unchanged constricting tumor in the body of the stomach, increased mesenteric peritoneal nodules, moderately dilated small bowel loops with a transition point in the right lower quadrant mesentery, sclerotic bone metastases  Initiation of palliative gastric radiation 04/09/2015, infusional 5-FU beginning 04/10/2015   2. Oxaliplatin neuropathy   3. Bilateral upper and lower lobe pulmonary emboli within segmental branches-now maintained on Coumadin. Followed by Coumadin clinic at the [facility].   4. Neutropenia related to chemotherapy 08/13/2014. Cycle 2 FOLFIRI held. Neulasta added beginning with cycle 2 on 08/20/2014.   5. Nausea following chemotherapy-the anti-emetic regimen was adjusted with cycle 4 FOLFIRI 09/17/2014   6. Diarrhea following FOLFIRI-no  improvement with Imodium. Lomotil initiated 10/15/2014.    7. Mouth sores following cycle 8 FOLFIRI. 5 fluorouracil dose reduced.   8. Admission 12/05/2014 with nausea/vomiting and diarrhea-likely secondary to FOLFIRI chemotherapy   9.  Abdominal pain secondary to carcinomatosis and gastric tumor-progressive   10. 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 Admission 01/09/2015 with abdominal pain and nausea/vomiting-a CT confirmed a distal small bowel obstruction. Resolved with bowel rest.   12. Depression. She is currently on Zoloft 100 mg daily.   13. Elevated blood pressure. Currently on Norvasc 5 mg daily.   14. Admission 03/20/2015 with nausea/vomiting-CT consistent with a small bowel obstruction, likely related to peritoneal tumor    Disposition:  Ms. Creech continues to have a poor performance status secondary to progressive tumor in the stomach and carcinomatosis. She will continue Dilaudid for pain. The plan is to complete a course of palliative radiation to the stomach with sensitizing 5-fluorouracil. She agrees to proceed with 5 fluorouracil.  We will follow her closely over the next few weeks and plan for a Hospice referral if her performance status does not improve with the current treatment. She will receive intravenous fluids today and we will repeat her vital signs in the chemotherapy room.  Betsy Coder, MD  04/10/2015  4:40 PM

## 2015-04-10 NOTE — Telephone Encounter (Signed)
Pt confirmed labs/ov per 08/25 POF, gave pt AVS and Calendar... KJ, sent msg to add chemo °

## 2015-04-10 NOTE — Patient Instructions (Signed)

## 2015-04-10 NOTE — Telephone Encounter (Signed)
Per staff message and POF I have scheduled appts. Advised scheduler of appts. JMW  

## 2015-04-10 NOTE — Progress Notes (Signed)
Pt in for [facility] lab draw and flush. While flushing pt port with saline, pt vomited x2. Emesis was clear and yellow, approx 800cc total. Pt also complained of pain in abdomen before vomiting and took her pain medication before port was accessed. Pt states she feels okay at time of discharge from flush. Provided with clean emesis back and called and informed Dr. Bernette Redbird nurse.

## 2015-04-10 NOTE — Progress Notes (Signed)
1557: Patient 's B/P is 157/111 and pulse 122. Manually B/P is 138/108 pulse 124. Patient actively vomiting. Last dose of Zofran was at 1300 today. Selena Lesser FNP assessed pt and orders received to infuse a liter of fluid over 1 hour and to continue with treatment for today. Patient escorted to radiation for her 5 pm tx.

## 2015-04-10 NOTE — Patient Instructions (Signed)
[facility] Discharge Instructions for Patients Receiving Chemotherapy  Today you received the following chemotherapy agents Adrucil.  To help prevent nausea and vomiting after your treatment, we encourage you to take your nausea medication as prescribed.   If you develop nausea and vomiting that is not controlled by your nausea medication, call the clinic.   BELOW ARE SYMPTOMS THAT SHOULD BE REPORTED IMMEDIATELY:  *FEVER GREATER THAN 100.5 F  *CHILLS WITH OR WITHOUT FEVER  NAUSEA AND VOMITING THAT IS NOT CONTROLLED WITH YOUR NAUSEA MEDICATION  *UNUSUAL SHORTNESS OF BREATH  *UNUSUAL BRUISING OR BLEEDING  TENDERNESS IN MOUTH AND THROAT WITH OR WITHOUT PRESENCE OF ULCERS  *URINARY PROBLEMS  *BOWEL PROBLEMS Dehydration, Adult Dehydration is when you lose more fluids from the body than you take in. Vital organs like the kidneys, brain, and heart cannot function without a proper amount of fluids and salt. Any loss of fluids from the body can cause dehydration.  CAUSES   Vomiting.  Diarrhea.  Excessive sweating.  Excessive urine output.  Fever. SYMPTOMS  Mild dehydration  Thirst.  Dry lips.  Slightly dry mouth. Moderate dehydration  Very dry mouth.  Sunken eyes.  Skin does not bounce back quickly when lightly pinched and released.  Dark urine and decreased urine production.  Decreased tear production.  Headache. Severe dehydration  Very dry mouth.  Extreme thirst.  Rapid, weak pulse (more than 100 beats per minute at rest).  Cold hands and feet.  Not able to sweat in spite of heat and temperature.  Rapid breathing.  Blue lips.  Confusion and lethargy.  Difficulty being awakened.  Minimal urine production.  No tears. DIAGNOSIS  Your caregiver will diagnose dehydration based on your symptoms and your exam. Blood and urine tests will help confirm the diagnosis. The diagnostic evaluation should also identify the cause of  dehydration. TREATMENT  Treatment of mild or moderate dehydration can often be done at home by increasing the amount of fluids that you drink. It is best to drink small amounts of fluid more often. Drinking too much at one time can make vomiting worse. Refer to the home care instructions below. Severe dehydration needs to be treated at the hospital where you will probably be given intravenous (IV) fluids that contain water and electrolytes. HOME CARE INSTRUCTIONS   Ask your caregiver about specific rehydration instructions.  Drink enough fluids to keep your urine clear or pale yellow.  Drink small amounts frequently if you have nausea and vomiting.  Eat as you normally do.  Avoid:  Foods or drinks high in sugar.  Carbonated drinks.  Juice.  Extremely hot or cold fluids.  Drinks with caffeine.  Fatty, greasy foods.  Alcohol.  Tobacco.  Overeating.  Gelatin desserts.  Wash your hands well to avoid spreading bacteria and viruses.  Only take over-the-counter or prescription medicines for pain, discomfort, or fever as directed by your caregiver.  Ask your caregiver if you should continue all prescribed and over-the-counter medicines.  Keep all follow-up appointments with your caregiver. SEEK MEDICAL CARE IF:  You have abdominal pain and it increases or stays in one area (localizes).  You have a rash, stiff neck, or severe headache.  You are irritable, sleepy, or difficult to awaken.  You are weak, dizzy, or extremely thirsty. SEEK IMMEDIATE MEDICAL CARE IF:   You are unable to keep fluids down or you get worse despite treatment.  You have frequent episodes of vomiting or diarrhea.  You have blood or green  matter (bile) in your vomit.  You have blood in your stool or your stool looks black and tarry.  You have not urinated in 6 to 8 hours, or you have only urinated a small amount of very dark urine.  You have a fever.  You faint. MAKE SURE YOU:    Understand these instructions.  Will watch your condition.  Will get help right away if you are not doing well or get worse. Document Released: 08/02/2005 Document Revised: 10/25/2011 Document Reviewed: 03/22/2011 [facility] Patient Information 2015 [place], [place]. This information is not intended to replace advice given to you by your health care provider. Make sure you discuss any questions you have with your health care provider. Fluorouracil, 5-FU injection What is this medicine? FLUOROURACIL, 5-FU (flure oh YOOR a sil) is a chemotherapy drug. It slows the growth of cancer cells. This medicine is used to treat many types of cancer like breast cancer, colon or rectal cancer, pancreatic cancer, and stomach cancer. This medicine may be used for other purposes; ask your health care provider or pharmacist if you have questions. COMMON BRAND NAME(S): Adrucil What should I tell my health care provider before I take this medicine? They need to know if you have any of these conditions: -blood disorders -dihydropyrimidine dehydrogenase (DPD) deficiency -infection (especially a virus infection such as chickenpox, cold sores, or herpes) -kidney disease -liver disease -malnourished, poor nutrition -recent or ongoing radiation therapy -an unusual or allergic reaction to fluorouracil, other chemotherapy, other medicines, foods, dyes, or preservatives -pregnant or trying to get pregnant -breast-feeding How should I use this medicine? This drug is given as an infusion or injection into a vein. It is administered in a hospital or clinic by a specially trained health care professional. Talk to your pediatrician regarding the use of this medicine in children. Special care may be needed. Overdosage: If you think you have taken too much of this medicine contact a poison control center or emergency room at once. NOTE: This medicine is only for you. Do not share this medicine with others. What if I miss  a dose? It is important not to miss your dose. Call your doctor or health care professional if you are unable to keep an appointment. What may interact with this medicine? -allopurinol -cimetidine -dapsone -digoxin -hydroxyurea -leucovorin -levamisole -medicines for seizures like ethotoin, fosphenytoin, phenytoin -medicines to increase blood counts like filgrastim, pegfilgrastim, sargramostim -medicines that treat or prevent blood clots like warfarin, enoxaparin, and dalteparin -methotrexate -metronidazole -pyrimethamine -some other chemotherapy drugs like busulfan, cisplatin, estramustine, vinblastine -trimethoprim -trimetrexate -vaccines Talk to your doctor or health care professional before taking any of these medicines: -acetaminophen -aspirin -ibuprofen -ketoprofen -naproxen This list may not describe all possible interactions. Give your health care provider a list of all the medicines, herbs, non-prescription drugs, or dietary supplements you use. Also tell them if you smoke, drink alcohol, or use illegal drugs. Some items may interact with your medicine. What should I watch for while using this medicine? Visit your doctor for checks on your progress. This drug may make you feel generally unwell. This is not uncommon, as chemotherapy can affect healthy cells as well as cancer cells. Report any side effects. Continue your course of treatment even though you feel ill unless your doctor tells you to stop. In some cases, you may be given additional medicines to help with side effects. Follow all directions for their use. Call your doctor or health care professional for advice if you get a fever,  chills or sore throat, or other symptoms of a cold or flu. Do not treat yourself. This drug decreases your body's ability to fight infections. Try to avoid being around people who are sick. This medicine may increase your risk to bruise or bleed. Call your doctor or health care professional  if you notice any unusual bleeding. Be careful brushing and flossing your teeth or using a toothpick because you may get an infection or bleed more easily. If you have any dental work done, tell your dentist you are receiving this medicine. Avoid taking products that contain aspirin, acetaminophen, ibuprofen, naproxen, or ketoprofen unless instructed by your doctor. These medicines may hide a fever. Do not become pregnant while taking this medicine. Women should inform their doctor if they wish to become pregnant or think they might be pregnant. There is a potential for serious side effects to an unborn child. Talk to your health care professional or pharmacist for more information. Do not breast-feed an infant while taking this medicine. Men should inform their doctor if they wish to father a child. This medicine may lower sperm counts. Do not treat diarrhea with over the counter products. Contact your doctor if you have diarrhea that lasts more than 2 days or if it is severe and watery. This medicine can make you more sensitive to the sun. Keep out of the sun. If you cannot avoid being in the sun, wear protective clothing and use sunscreen. Do not use sun lamps or tanning beds/booths. What side effects may I notice from receiving this medicine? Side effects that you should report to your doctor or health care professional as soon as possible: -allergic reactions like skin rash, itching or hives, swelling of the face, lips, or tongue -low blood counts - this medicine may decrease the number of white blood cells, red blood cells and platelets. You may be at increased risk for infections and bleeding. -signs of infection - fever or chills, cough, sore throat, pain or difficulty passing urine -signs of decreased platelets or bleeding - bruising, pinpoint red spots on the skin, black, tarry stools, blood in the urine -signs of decreased red blood cells - unusually weak or tired, fainting spells,  lightheadedness -breathing problems -changes in vision -chest pain -mouth sores -nausea and vomiting -pain, swelling, redness at site where injected -pain, tingling, numbness in the hands or feet -redness, swelling, or sores on hands or feet -stomach pain -unusual bleeding Side effects that usually do not require medical attention (report to your doctor or health care professional if they continue or are bothersome): -changes in finger or toe nails -diarrhea -dry or itchy skin -hair loss -headache -loss of appetite -sensitivity of eyes to the light -stomach upset -unusually teary eyes This list may not describe all possible side effects. Call your doctor for medical advice about side effects. You may report side effects to FDA at 1-800-FDA-1088. Where should I keep my medicine? This drug is given in a hospital or clinic and will not be stored at home. NOTE: This sheet is a summary. It may not cover all possible information. If you have questions about this medicine, talk to your doctor, pharmacist, or health care provider.  2015, Elsevier/Gold Standard. (2007-12-06 13:53:16)   UNUSUAL RASH Items with * indicate a potential emergency and should be followed up as soon as possible.  Feel free to call the clinic you have any questions or concerns. The clinic phone number is [phone].  Please show the [facility]  at check-in to the Emergency Department and triage nurse.

## 2015-04-11 ENCOUNTER — Ambulatory Visit
Admission: RE | Admit: 2015-04-11 | Discharge: 2015-04-11 | Disposition: A | Discharge status: Home / Self Care | Payer: Medicaid Other | Source: Ambulatory Visit | Attending: Radiation Oncology | Admitting: Radiation Oncology

## 2015-04-11 ENCOUNTER — Encounter: Payer: Self-pay | Admitting: Radiation Oncology

## 2015-04-11 ENCOUNTER — Encounter: Payer: Self-pay | Admitting: *Deleted

## 2015-04-11 ENCOUNTER — Ambulatory Visit (HOSPITAL_BASED_OUTPATIENT_CLINIC_OR_DEPARTMENT_OTHER): Payer: Medicaid Other

## 2015-04-11 VITALS — BP 153/109 | HR 108 | Temp 97.9°F | Resp 16 | Ht 62.0 in | Wt 140.4 lb

## 2015-04-11 VITALS — BP 166/102 | HR 101 | Temp 98.8°F | Resp 18

## 2015-04-11 DIAGNOSIS — R112 Nausea with vomiting, unspecified: Secondary | ICD-10-CM

## 2015-04-11 DIAGNOSIS — C169 Malignant neoplasm of stomach, unspecified: Secondary | ICD-10-CM

## 2015-04-11 DIAGNOSIS — E860 Dehydration: Secondary | ICD-10-CM

## 2015-04-11 DIAGNOSIS — Z510 Encounter for antineoplastic radiation therapy: Secondary | ICD-10-CM | POA: Diagnosis not present

## 2015-04-11 MED ORDER — SODIUM CHLORIDE 0.9 % IV SOLN
Freq: Once | INTRAVENOUS | Status: AC
  Administered 2015-04-11: 15:00:00 via INTRAVENOUS

## 2015-04-11 MED ORDER — RADIAPLEXRX EX GEL
Freq: Once | CUTANEOUS | Status: AC
  Administered 2015-04-11: 09:00:00 via TOPICAL

## 2015-04-11 NOTE — Patient Instructions (Addendum)
Nuseas y vmitos (Nausea and Vomiting)  Naseas significa que tiene ganas de vomitar. Elvmitoes un reflejo por el que los contenidos del estmago salen por la boca.  CUIDADOS EN EL HOGAR  Tome los medicamentos como le indic el mdico.  No se esfuerce por comer. Sin embargo, es necesario que tome lquidos.  Si tiene ganas de comer, [place] dieta normal, segn las indicaciones del mdico.  Coma arroz, trigo, patatas, pan, carnes magras, yogur, frutas y vegetales.  Evite los alimentos [employer].  Beba gran cantidad de lquidos para mantener la orina de tono claro o color amarillo plido.  Consulte a su mdico como reponer la prdida de lquidos (rehidratacin). Los signos de prdida de lquidos (deshidratacin) son:  Catering manager sed.  Tiene los labios y la boca secos.  Est mareado.  La orina es [place].  Orina menos que lo normal.  Se siente confundido.  La respiracin o la frecuencia cardaca son rpidas. SOLICITE AYUDA DE INMEDIATO SI:  Observa sangre en su vmito.  La materia fecal (heces)es negra o de color rojo.  Siente un dolor de cabeza muy intenso o el cuello rgido.  Se siente confundido.  Siente dolor muy fuerte en el vientre (abdominal).  Tiene dolor en el pecho o dificultad para respirar.  No ha orinado durante 8 horas.  Tiene la piel fra y pegajosa.  Sigue vomitando despus de 24  48 horas.  Tiene fiebre. ASEGRESE QUE:  Comprende estas instrucciones.  Controlar la enfermedad.  Puede solicitar ayuda de inmediato si los sntomas no mejoran, o si empeoran. Document Released: 01/20/2010 Document Revised: 10/25/2011 [facility] Patient Information 2015 [place]. This information is not intended to replace advice given to you by your health care provider. Make sure you discuss any questions you have with your health care provider.  Deshidratacin, Adulto (Dehydration, Adult) Se denomina deshidratacin cuando se pierden ms lquidos  de los que se ingieren. Los rganos [employer] riones, el cerebro y el corazn no pueden funcionar sin la [place] cantidad de lquidos y Press photographer. Cualquier prdida de lquidos del organismo puede causar deshidratacin.  CAUSAS  Vmitos.  Diarrea.  Sudoracin excesiva.  Eliminacin excesiva de orina.  Cristy Hilts. SNTOMAS Deshidratacin leve  Sed.  Labios resecos.  Sequedad leve de la mucosa bucal. Deshidratacin moderada   La boca est muy seca.  Ojos hundidos.  La piel no vuelve rpidamente a su lugar cuando se suelta luego de pellizcarla ligeramente.  Elmon Else y disminucin de la produccin de [place].  Disminucin de la cantidad de lgrimas.  Dolor de [place]. Deshidratacin grave  La boca est muy seca.  Sed extrema.  Pulso rpido y dbil (ms de 100 por minuto en reposo).  Manos y pies fros.  Prdida de la capacidad para transpirar, independientemente del calor y la temperatura.  Respiracin rpida.  Labios azulados.  Confusin y Statistician.  Dificultad para despertarse.  Poca produccin de [place].  Falta de lgrimas. DIAGNSTICO El profesional diagnosticar deshidratacin basndose en los sntomas y el examen fsico. Las pruebas de sangre y [place] ayudarn a Firefighter el diagnstico. La evaluacin diagnstica suele identificar tambin las causas de la deshidratacin. [place] deshidratacin leve o moderada generalmente puede hacerse en el hogar mediante el aumento de la cantidad de los lquidos que se beben. Es mejor beber pequeas cantidades de lquidos con mayor frecuencia. Beber demasiado de una sola vez puede hacer que el vmito empeore. Siga las instrucciones para el Art therapist que se  indican a continuacin.  La deshidratacin grave debe tratarse en el hospital en el que probablemente le administren lquidos por va intravenosa (IV) que contienen agua y Brewing technologist.  INSTRUCCIONES PARA EL CUIDADO  DOMICILIARIO  Pida instrucciones especficas a su mdico con respecto a la rehidratacin.  Debe ingerir gran cantidad de lquido para mantener la orina de tono claro o color amarillo plido.  Beba pequeas cantidades con frecuencia si tiene nuseas y vmitos.  Alimntese como lo hace normalmente.  Evite:  Alimentos o bebidas que Animal nutritionist.  Gaseosas.  Jugos.  Lquidos muy calientes o fros.  Bebidas con cafena.  Alimentos muy grasos.  Alcohol.  Clif[place]on Asters demasiado a la vez.  Postres de gelatina.  Lave bien sus manos para evitar las bacterias o los virus se diseminen.  Slo tome medicamentos de venta libre o recetados para Glass blower/designer, las molestias o bajar la fiebre segn las indicaciones de su mdico.  Consulte a su mdico si puede seguir tomando todos sus medicamentos recetados o de Radio broadcast assistant.  Concurra puntualmente a las citas de control con el mdico. SOLICITE ATENCIN MDICA SI:  Tiene dolor abdominal y este aumenta o permanece en un rea (se localiza).  Aparece una erupcin, rigidez en el cuello o dolor de cabeza intensos.  Est irritable, somnoliento o le cuesta despertarse.  Se siente dbil, mareado tiene mucha sed. SOLICITE ATENCIN MDICA DE INMEDIATO SI:  No puede retener lquidos o empeora a pesar del tratamiento.  Tiene episodios frecuentes de vmitos o diarrea.  Observa sangre o una sustancia verde (bilis) en el vmito.  Lollie Marrow en la materia fecal, o las heces son negras y de aspecto alquitranado.  No ha orinado durante 6 a 8 horas, o slo ha [place] cantidad [place] de [place].  Tiene fiebre.  Se desmaya. ASEGRESE QUE:  Comprende estas instrucciones.  Controlar su enfermedad.  Solicitar ayuda inmediatamente si no mejora o si empeora. Document Released: 08/02/2005 Document Revised: 10/25/2011 [facility] Patient Information 2015 [place]. This information is not intended to replace  advice given to you by your health care provider. Make sure you discuss any questions you have with your health care provider.

## 2015-04-11 NOTE — Progress Notes (Signed)
Pt here for patient teaching.  Pt given Radiation and You booklet, Managing Acute Radiation Side Effects for Head and Neck Cancer handout and Radiaplex gel. Reviewed areas of pertinence such as diarrhea, fatigue, nausea and vomiting, skin changes and urinary and bladder changes . Pt able to give teach back of to pat skin, use unscented/gentle soap, use baby wipes and have Imodium on hand,apply Radiaplex bid and avoid applying anything to skin within 4 hours of treatment. Pt demonstrated understanding, needs reinforcement and verbalizes understanding of information given and will contact nursing with any questions or concerns.

## 2015-04-11 NOTE — Progress Notes (Addendum)
Alicia [name] has completed 3 fractions to her stomach and abdomen.  She is here with an interpreter.  She reports pain at a 7/10 in her left upper abdomen.  She takes dilaudid 1 tablet every 2.5-3 hours and 2 tablets at bedtime.  She has chemotherapy infusing through her right chest port a cath.  She reports having a poor appetite.  She reports she has about 4 loose stools per day.  She says that she has a prescription for an anti diarrheal but can't remember the name of it.  BP 153/109 mmHg  Pulse 108  Temp(Src) 97.9 F (36.6 C) (Oral)  Resp 16  Ht 5\' 2"  (1.575 m)  Wt 140 lb 6.4 oz (63.685 kg)  BMI 25.67 kg/m2   Wt Readings from Last 3 Encounters:  04/11/15 140 lb 6.4 oz (63.685 kg)  04/10/15 134 lb 8 oz (61.009 kg)  04/02/15 142 lb 9.6 oz (64.683 kg)

## 2015-04-11 NOTE — Progress Notes (Signed)
[facility] Psychosocial Distress Screening Clinical Social Work  Clinical Social Work was referred by distress screening protocol and previous concerns.  The patient scored a 5 on the Psychosocial Distress Thermometer which indicates moderate distress. Clinical Social Worker met with pt, her husband and interpreter, Almyra Free to assess for distress and other psychosocial needs. Pt has active medicaid currently and could qualify for CNA assistance at home. Pt agreeable and CSW proceeded with referral through [facility]. CSW had MD sign form and faxed to [facility]. Home-based palliative care referral also discussed with medical team and placed by RN, Amy. Pt reports she is able to access her medications now that her medicaid is active. Pt denied other needs and agrees to referrals.   ONCBCN DISTRESS SCREENING 04/02/2015  Screening Type Initial Screening  Distress experienced in past week (1-10) 5  Practical problem type   Emotional problem type Depression;Nervousness/Anxiety  Spiritual/Religous concerns type Loss of sense of purpose  Physical Problem type Pain;Nausea/vomiting;Sleep/insomnia;Getting around;Bathing/dressing;Loss of appetitie;Constipation/diarrhea;Tingling hands/feet  Physician notified of physical symptoms Yes  Referral to clinical social work Yes  Referral to dietition Yes     Clinical Social Worker follow up needed: Yes.    If yes, follow up plan: See above plans Loren Racer, [place]  [facility] Phone: [phone] Fax: [phone]

## 2015-04-11 NOTE — Patient Instructions (Addendum)
Take 2mg  on 8/25, 8/26 and 8/29. Take 1mg  on 8/27 and 8/28. Recheck INR on Tuesday, 8/30, prior to radiation treatment.

## 2015-04-11 NOTE — Progress Notes (Addendum)
INR within goal today. Hg/Hct: 12.6/38.5, pltc = 384 Pt took coumadin as instructed. No missed or extra coumadin doses. No changes in her medications. Her chemotherapy has been changed to Daily 5FU + XRT. (5FU can greatly increase warfarin levels). Pt is also not eating much at all due to her tumor - a few bites here and there. She looks weak. MD considering hospice referall over the next few days. She has a few small bruises on her legs. Nothing unsual. No bleeding noted. No s/s of clotting. Spoke with Dr. Benay Spice. Will decrease coumadin dose 50% and recheck INR in 5 days. Decrease coumadin to 2mg  on 8/25, 8/26 and 8/29, and 1mg  on 8/27 and 8/28. Recheck INR on Tuesday, 04/15/15; lab at 3:45pm, Coumadin clinic at 4pm and radiation treatment at 4:35pm. No charge coumadin clinic encounter.

## 2015-04-11 NOTE — Progress Notes (Signed)
Patient blood pressure 151/100 pulse 115. Patient states she vomited x 1 this AM and has been nauseated all day. Patient takes norvasc for blood pressure and has taken zofran and compazine around the clock without relief from nausea. Selena Lesser, NP notified.

## 2015-04-11 NOTE — Progress Notes (Signed)
  Radiation Oncology         [phone] ________________________________  Name: Alicia [name] MRN: [id number]  Date: 04/04/2015  DOB: Jan 02, 1961  SIMULATION AND TREATMENT PLANNING NOTE  DIAGNOSIS:  Stage for gastric cancer  Site:  1.  Stomach 2.  Tumor deposit more inferiorly within the abdomen  NARRATIVE:  The patient was brought to the [place].  Identity was confirmed.  All relevant records and images related to the planned course of therapy were reviewed.   Written consent to proceed with treatment was confirmed which was freely given after reviewing the details related to the planned course of therapy had been reviewed with the patient.  Then, the patient was set-up in a stable reproducible  supine position for radiation therapy.  CT images were obtained.  Surface markings were placed.    Medically necessary complex treatment device(s) for immobilization:  Customized vac lock bag.   The CT images were loaded into the planning software.  Then the target and avoidance structures were contoured.  Treatment planning then occurred.  The radiation prescription was entered and confirmed.  A total of 7 complex treatment devices were fabricated which relate to the designed radiation treatment fields. A 4 field technique will be used for the tumor deposit more inferiorly and a 3 field technique will be used to treat the gastric tumor for a combined total of 7 customized fields for the 2 separate target regions. Each of these customized fields/ complex treatment devices will be used on a daily basis during the radiation course. I have requested : 3D Simulation  I have requested a DVH of the following structures: Target volume, spinal cord, liver, kidneys bilaterally.  The patient will undergo daily image guidance to ensure accurate localization of the target, and adequate minimize dose to the normal surrounding structures in close proximity to the target.    PLAN:  The  patient will receive 37.5 Gy in 15 fractions.   Special treatment procedure The patient will also receive concurrent chemotherapy during the treatment. The patient may therefore experience increased toxicity or side effects and the patient will be monitored for such problems. This may require extra lab work as necessary. This therefore constitutes a special treatment procedure.    ________________________________   Jodelle Gross, MD, PhD

## 2015-04-11 NOTE — Progress Notes (Signed)
Department of Radiation Oncology  Phone:  [phone] Fax:        [phone]  Weekly Treatment Note    Name: Alicia [name] Date: 04/11/2015 MRN: [id number] DOB: 01-07-1961   Current dose: 7.5 Gy  Current fraction: 3   MEDICATIONS: Current Outpatient Prescriptions  Medication Sig Dispense Refill  . Alum & Mag Hydroxide-Simeth (MAGIC MOUTHWASH) SOLN Take 5 mLs by mouth 4 (four) times daily as needed for mouth pain. 300 mL 1  . amLODipine (NORVASC) 5 MG tablet Take 1 tablet (5 mg total) by mouth daily. 30 tablet 2  . HYDROmorphone (DILAUDID) 4 MG tablet Take 1-2 tablets (4-8 mg total) by mouth every 4 (four) hours as needed for severe pain. 120 tablet 0  . ondansetron (ZOFRAN) 4 MG tablet Take 1 tablet (4 mg total) by mouth every 6 (six) hours. 30 tablet 0  . pantoprazole (PROTONIX) 40 MG tablet Take 1 tablet (40 mg total) by mouth 2 (two) times daily. 60 tablet 0  . potassium chloride SA (K-DUR,KLOR-CON) 20 MEQ tablet Take 2 tablets (40 mEq total) by mouth daily. 60 tablet 0  . prochlorperazine (COMPAZINE) 10 MG tablet Take 1 tablet (10 mg total) by mouth every 6 (six) hours as needed for nausea or vomiting. 30 tablet 2  . sertraline (ZOLOFT) 100 MG tablet Take 1 tablet (100 mg total) by mouth daily. 30 tablet 2  . warfarin (COUMADIN) 4 MG tablet Take 1-1.5 tablets (4-6 mg total) by mouth See admin instructions. 4 mg Sunday Monday Wednsday Friday 6 mg Tuesday Thursday Saturday 60 tablet 1  . hyaluronate sodium (RADIAPLEXRX) GEL Apply 1 application topically 2 (two) times daily.    Marland Kitchen oxyCODONE (OXY IR/ROXICODONE) 5 MG immediate release tablet Take 1 tablet (5 mg total) by mouth every 6 (six) hours as needed for moderate pain. (Patient not taking: Reported on 04/02/2015) 30 tablet 0  . [DISCONTINUED] rivaroxaban (XARELTO) 20 MG TABS tablet Take 1 tablet (20 mg total) by mouth daily with supper. 30 tablet 1   No current facility-administered medications for this encounter.    Facility-Administered Medications Ordered in Other Encounters  Medication Dose Route Frequency Provider Last Rate Last Dose  . sodium chloride 0.9 % injection 10 mL  10 mL Intracatheter PRN Ladell Pier, MD      . sodium chloride 0.9 % injection 10 mL  10 mL Intravenous PRN Ladell Pier, MD   10 mL at 01/16/15 1126     ALLERGIES: Xarelto   LABORATORY DATA:  Lab Results  Component Value Date   WBC 11.6* 04/10/2015   HGB 12.6 04/10/2015   HCT 38.5 04/10/2015   MCV 89.4 04/10/2015   PLT 384 04/10/2015   Lab Results  Component Value Date   NA 134* 04/10/2015   K 4.5 04/10/2015   CL 103 03/25/2015   CO2 25 04/10/2015   Lab Results  Component Value Date   ALT 29 04/10/2015   AST 47* 04/10/2015   ALKPHOS 588* 04/10/2015   BILITOT 1.03 04/10/2015     NARRATIVE: Fabian November was seen today for weekly treatment management. The chart was checked and the patient's films were reviewed.  Pt here for patient teaching.  Pt given Radiation and You booklet, Managing Acute Radiation Side Effects for Head and Neck Cancer handout and Radiaplex gel. Reviewed areas of pertinence such as diarrhea, fatigue, nausea and vomiting, skin changes and urinary and bladder changes . Pt able to give teach back of to pat skin, use  unscented/gentle soap, use baby wipes and have Imodium on hand,apply Radiaplex bid and avoid applying anything to skin within 4 hours of treatment. Pt demonstrated understanding, needs reinforcement and verbalizes understanding of information given and will contact nursing with any questions or concerns.    The patient has not having any new problems she states at this point. Continued abdominal pain. We discussed the timeframe of possible improvement.  PHYSICAL EXAMINATION: height is 5\' 2"  (1.575 m) and weight is 140 lb 6.4 oz (63.685 kg). Her oral temperature is 97.9 F (36.6 C). Her blood pressure is 153/109 and her pulse is 108. Her respiration is 16.         ASSESSMENT: The patient is doing satisfactorily with treatment.  PLAN: We will continue with the patient's radiation treatment as planned.

## 2015-04-12 ENCOUNTER — Other Ambulatory Visit: Payer: Self-pay | Admitting: Oncology

## 2015-04-14 ENCOUNTER — Ambulatory Visit
Admission: RE | Admit: 2015-04-14 | Discharge: 2015-04-14 | Disposition: A | Discharge status: Home / Self Care | Payer: Medicaid Other | Source: Ambulatory Visit | Attending: Radiation Oncology | Admitting: Radiation Oncology

## 2015-04-14 DIAGNOSIS — Z510 Encounter for antineoplastic radiation therapy: Secondary | ICD-10-CM | POA: Diagnosis not present

## 2015-04-15 ENCOUNTER — Ambulatory Visit (HOSPITAL_BASED_OUTPATIENT_CLINIC_OR_DEPARTMENT_OTHER): Payer: Self-pay | Admitting: Pharmacist

## 2015-04-15 ENCOUNTER — Ambulatory Visit
Admission: RE | Admit: 2015-04-15 | Discharge: 2015-04-15 | Disposition: A | Discharge status: Home / Self Care | Payer: Medicaid Other | Source: Ambulatory Visit | Attending: Radiation Oncology | Admitting: Radiation Oncology

## 2015-04-15 ENCOUNTER — Other Ambulatory Visit (HOSPITAL_BASED_OUTPATIENT_CLINIC_OR_DEPARTMENT_OTHER): Payer: Medicaid Other

## 2015-04-15 DIAGNOSIS — I2699 Other pulmonary embolism without acute cor pulmonale: Secondary | ICD-10-CM | POA: Diagnosis present

## 2015-04-15 DIAGNOSIS — C169 Malignant neoplasm of stomach, unspecified: Secondary | ICD-10-CM

## 2015-04-15 DIAGNOSIS — Z510 Encounter for antineoplastic radiation therapy: Secondary | ICD-10-CM | POA: Diagnosis not present

## 2015-04-15 DIAGNOSIS — Z7901 Long term (current) use of anticoagulants: Secondary | ICD-10-CM

## 2015-04-15 LAB — PROTIME-INR
INR: 1.8 — AB (ref 2.00–3.50)
Protime: 21.6 Seconds — ABNORMAL HIGH (ref 10.6–13.4)

## 2015-04-15 LAB — POCT INR: INR: 1.8

## 2015-04-15 NOTE — Progress Notes (Signed)
INR below goal today.  Alicia [name] started 57fu infusion/xrt on 04/10/15.  No bleeding/ bruising.  Her appetite may be a little improved but not much.  She was on a confusing coumadin dose.  Will try to simplify coumadin to 2mg  daily.  Alicia Elba Barman states that she will be talking to Dr. Benay Spice on 04/17/15 appt about whether or not she will continue chemo.  Will f/u INR and chemo decision with Alicia Mcwherter on 04/17/15 and adjust coumadin dose appropriately.  Alicia Lanni seen with Wynonia Hazard, today.

## 2015-04-16 ENCOUNTER — Encounter: Payer: Self-pay | Admitting: Radiation Oncology

## 2015-04-16 ENCOUNTER — Ambulatory Visit
Admission: RE | Admit: 2015-04-16 | Discharge: 2015-04-16 | Disposition: A | Discharge status: Home / Self Care | Payer: Medicaid Other | Source: Ambulatory Visit | Attending: Radiation Oncology | Admitting: Radiation Oncology

## 2015-04-16 DIAGNOSIS — Z510 Encounter for antineoplastic radiation therapy: Secondary | ICD-10-CM | POA: Diagnosis not present

## 2015-04-17 ENCOUNTER — Ambulatory Visit (HOSPITAL_BASED_OUTPATIENT_CLINIC_OR_DEPARTMENT_OTHER): Payer: Self-pay | Admitting: Pharmacist

## 2015-04-17 ENCOUNTER — Ambulatory Visit (HOSPITAL_BASED_OUTPATIENT_CLINIC_OR_DEPARTMENT_OTHER): Payer: Medicaid Other | Admitting: Oncology

## 2015-04-17 ENCOUNTER — Other Ambulatory Visit (HOSPITAL_BASED_OUTPATIENT_CLINIC_OR_DEPARTMENT_OTHER): Payer: Medicaid Other

## 2015-04-17 ENCOUNTER — Telehealth: Payer: Self-pay | Admitting: Oncology

## 2015-04-17 ENCOUNTER — Ambulatory Visit (HOSPITAL_BASED_OUTPATIENT_CLINIC_OR_DEPARTMENT_OTHER): Payer: Medicaid Other

## 2015-04-17 ENCOUNTER — Ambulatory Visit
Admission: RE | Admit: 2015-04-17 | Discharge: 2015-04-17 | Disposition: A | Discharge status: Home / Self Care | Payer: Medicaid Other | Source: Ambulatory Visit | Attending: Radiation Oncology | Admitting: Radiation Oncology

## 2015-04-17 ENCOUNTER — Ambulatory Visit: Payer: Medicaid Other

## 2015-04-17 VITALS — BP 136/77 | HR 124 | Temp 98.3°F | Resp 18 | Wt 135.5 lb

## 2015-04-17 DIAGNOSIS — R109 Unspecified abdominal pain: Secondary | ICD-10-CM

## 2015-04-17 DIAGNOSIS — C169 Malignant neoplasm of stomach, unspecified: Secondary | ICD-10-CM | POA: Diagnosis present

## 2015-04-17 DIAGNOSIS — Z5111 Encounter for antineoplastic chemotherapy: Secondary | ICD-10-CM

## 2015-04-17 DIAGNOSIS — G620 Drug-induced polyneuropathy: Secondary | ICD-10-CM | POA: Diagnosis not present

## 2015-04-17 DIAGNOSIS — R110 Nausea: Secondary | ICD-10-CM | POA: Diagnosis not present

## 2015-04-17 DIAGNOSIS — Z7901 Long term (current) use of anticoagulants: Secondary | ICD-10-CM

## 2015-04-17 DIAGNOSIS — C786 Secondary malignant neoplasm of retroperitoneum and peritoneum: Secondary | ICD-10-CM | POA: Diagnosis not present

## 2015-04-17 DIAGNOSIS — I2699 Other pulmonary embolism without acute cor pulmonale: Secondary | ICD-10-CM | POA: Diagnosis not present

## 2015-04-17 DIAGNOSIS — Z95828 Presence of other vascular implants and grafts: Secondary | ICD-10-CM

## 2015-04-17 DIAGNOSIS — C7951 Secondary malignant neoplasm of bone: Secondary | ICD-10-CM

## 2015-04-17 DIAGNOSIS — Z510 Encounter for antineoplastic radiation therapy: Secondary | ICD-10-CM | POA: Diagnosis not present

## 2015-04-17 LAB — CBC WITH DIFFERENTIAL/PLATELET
BASO%: 0.2 % (ref 0.0–2.0)
BASOS ABS: 0 10*3/uL (ref 0.0–0.1)
EOS ABS: 0 10*3/uL (ref 0.0–0.5)
EOS%: 0.8 % (ref 0.0–7.0)
HCT: 36.6 % (ref 34.8–46.6)
HGB: 12.1 g/dL (ref 11.6–15.9)
LYMPH%: 10.4 % — AB (ref 14.0–49.7)
MCH: 29.4 pg (ref 25.1–34.0)
MCHC: 33.1 g/dL (ref 31.5–36.0)
MCV: 88.8 fL (ref 79.5–101.0)
MONO#: 0.8 10*3/uL (ref 0.1–0.9)
MONO%: 15.6 % — ABNORMAL HIGH (ref 0.0–14.0)
NEUT#: 3.9 10*3/uL (ref 1.5–6.5)
NEUT%: 73 % (ref 38.4–76.8)
PLATELETS: 335 10*3/uL (ref 145–400)
RBC: 4.12 10*6/uL (ref 3.70–5.45)
RDW: 19.9 % — ABNORMAL HIGH (ref 11.2–14.5)
WBC: 5.3 10*3/uL (ref 3.9–10.3)
lymph#: 0.6 10*3/uL — ABNORMAL LOW (ref 0.9–3.3)

## 2015-04-17 LAB — PROTIME-INR
INR: 1.6 — ABNORMAL LOW (ref 2.00–3.50)
PROTIME: 19.2 s — AB (ref 10.6–13.4)

## 2015-04-17 LAB — BASIC METABOLIC PANEL (CC13)
ANION GAP: 9 meq/L (ref 3–11)
BUN: 18.6 mg/dL (ref 7.0–26.0)
CO2: 24 mEq/L (ref 22–29)
CREATININE: 0.7 mg/dL (ref 0.6–1.1)
Calcium: 9.1 mg/dL (ref 8.4–10.4)
Chloride: 100 mEq/L (ref 98–109)
EGFR: >90 mL/min/{1.73_m2} (ref >90)
Glucose: 139 mg/dl (ref 70–140)
Potassium: 4.1 mEq/L (ref 3.5–5.1)
Sodium: 132 mEq/L — ABNORMAL LOW (ref 136–145)

## 2015-04-17 LAB — POCT INR: INR: 1.6

## 2015-04-17 MED ORDER — HEPARIN SOD (PORK) LOCK FLUSH 100 UNIT/ML IV SOLN
500.0000 [IU] | Freq: Once | INTRAVENOUS | Status: DC | PRN
  Filled 2015-04-17: qty 5

## 2015-04-17 MED ORDER — SODIUM CHLORIDE 0.9 % IV SOLN
225.0000 mg/m2/d | INTRAVENOUS | Status: DC
  Administered 2015-04-17: 2650 mg via INTRAVENOUS
  Filled 2015-04-17: qty 53

## 2015-04-17 MED ORDER — SODIUM CHLORIDE 0.9 % IJ SOLN
10.0000 mL | INTRAMUSCULAR | Status: DC | PRN
  Filled 2015-04-17: qty 10

## 2015-04-17 MED ORDER — PROCHLORPERAZINE EDISYLATE 5 MG/ML IJ SOLN
INTRAMUSCULAR | Status: AC
  Filled 2015-04-17: qty 2

## 2015-04-17 MED ORDER — SODIUM CHLORIDE 0.9 % IV SOLN
Freq: Once | INTRAVENOUS | Status: AC
  Administered 2015-04-17: 12:00:00 via INTRAVENOUS

## 2015-04-17 MED ORDER — PROCHLORPERAZINE EDISYLATE 5 MG/ML IJ SOLN
10.0000 mg | Freq: Once | INTRAMUSCULAR | Status: AC
  Administered 2015-04-17: 10 mg via INTRAVENOUS

## 2015-04-17 NOTE — Progress Notes (Signed)
Patient reported to flush with pump infusion. Pump reservoir displayed 7 mls left of infusion. Spoke to Dr Gearldine Shown nurse. Pt will get labs drawn peripherally and let her infusion complete. Port will be re-accessed in the treatment room.

## 2015-04-17 NOTE — Progress Notes (Signed)
Pt c/o nausea. Spoke with Lavella Lemons RN, Dr. Gearldine Shown nurse, order received for compazine. Reminded pt that her paracentesis is tomorrow.  Pt came with a form about a Medicaid denial and had questions. Social Work contacted. Also notified Public affairs consultant. Pt to meet with Financial Councilor after her radiation treatment, will have an interpreter for the visit.

## 2015-04-17 NOTE — Patient Instructions (Signed)

## 2015-04-17 NOTE — Progress Notes (Signed)
[facility] OFFICE PROGRESS NOTE   Diagnosis:  Gastric cancer  INTERVAL HISTORY:   Ms. Duby returns as scheduled. She continues infusional 5-FU and radiation. She reports intermittent nausea. She is tolerating a diet. She has 3-5 small volume loose stools per day. She continues to have abdominal pain. The abdomen is now distended.  Objective:  Vital signs in last 24 hours:  Blood pressure 136/77, pulse 124, temperature 98.3 F (36.8 C), temperature source Oral, resp. rate 18, weight 135 lb 8 oz (61.462 kg), SpO2 97 %.    HEENT: Mild dryness, no thrush Resp: Decreased breath sounds at the lower chest, no respiratory distress Cardio: Tachycardia, regular rhythm GI: Distended, Mass deep to and surrounding the umbilicus, masslike fullness in the upper abdomen Vascular: No leg edema  Portacath/PICC-without erythema  Lab Results:  Lab Results  Component Value Date   WBC 5.3 04/17/2015   HGB 12.1 04/17/2015   HCT 36.6 04/17/2015   MCV 88.8 04/17/2015   PLT 335 04/17/2015   NEUTROABS 3.9 04/17/2015   PT/INR 1.6   Lab Results  Component Value Date   CEA 4.6 02/27/2015    Medications: I have reviewed the patient's current medications.  Assessment/Plan: 1. Stage IV gastric cancer  Status post bilateral oophorectomy, total abdominal hysterectomy, omentectomy, resection of abdominal wall and pelvic masses, bilateral pelvic lymphadenectomy 10/18/2013 with the pathology confirming moderate to poorly differentiated adenocarcinoma  Stomach biopsy 11/28/2013 revealed adenocarcinoma  Status post 12 cycles of FOLFOX, last given 04/25/2014  Restaging CT 04/29/2014 with thickening of the stomach and nodular densities adjacent to the greater curvature-unchanged, faint groundglass nodularity in the lungs-nonspecific  Restaging CT 07/22/2014 with indeterminate lung nodules; moderate wall thickening in the body region of the stomach; fairly extensive omental  disease and findings suspicious for abdominal wall and incisional disease.  Started systemic chemotherapy with FOLFIRI on 07/30/14.  Restaging CT evaluation 10/14/2014 following 5 cycles of FOLFIRI showed decreased nodularity in both lungs; overall stable omental nodularity; involution of the previously demonstrated ill-defined soft tissue mass involving the anterior abdominal wall incision.  Cycle 6 FOLFIRI 10/15/2014  Cycle 7 FOLFIRI 10/29/2014  Cycle 8 FOLFIRI 11/12/2014  Cycle 9 FOLFIRI 11/26/2014 (dose reduction of 5-fluorouracil due to mucositis)  CT abdomen/pelvis 12/05/2014 revealed mild progression of peritoneal/omental tumor  Initiation of weekly Taxol (3 weeks on/1 week off) and every 2 week Ramucirumab 12/17/2014  CT abdomen/pelvis 01/08/2015 with findings worrisome for distal small bowel obstruction presumed secondary to adhesions. Apparent right-sided ureteral obstruction possibly secondary to developing adhesions; interval development of a small amount of intra-abdominal ascites. Grossly unchanged peritoneal carcinomatosis. Unchanged 1.4 cm sclerotic osseous metastasis within the right ilium.  Cycle 2 Taxol/Ramucirumab beginning 01/16/2015  Cycle 3 Taxol/ Ramucirumab beginning 02/13/2015  Cycle 4 Taxol/ Ramucirumab beginning 03/13/2015  CT abdomen and pelvis 03/20/2015-unchanged constricting tumor in the body of the stomach, increased mesenteric peritoneal nodules, moderately dilated small bowel loops with a transition point in the right lower quadrant mesentery, sclerotic bone metastases  Initiation of palliative gastric radiation 04/09/2015, infusional 5-FU beginning 04/10/2015   2. Oxaliplatin neuropathy   3. Bilateral upper and lower lobe pulmonary emboli within segmental branches-now maintained on Coumadin. Followed by Coumadin clinic at the [facility].   4. Neutropenia related to chemotherapy 08/13/2014. Cycle 2 FOLFIRI held. Neulasta added beginning  with cycle 2 on 08/20/2014.   5. Nausea following chemotherapy-the anti-emetic regimen was adjusted with cycle 4 FOLFIRI 09/17/2014   6. Diarrhea following FOLFIRI-no improvement with Imodium. Lomotil  initiated 10/15/2014.    7. Mouth sores following cycle 8 FOLFIRI. 5 fluorouracil dose reduced.   8. Admission 12/05/2014 with nausea/vomiting and diarrhea-likely secondary to FOLFIRI chemotherapy   9. Abdominal pain secondary to carcinomatosis and gastric tumor-progressive   10. 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 Admission 01/09/2015 with abdominal pain and nausea/vomiting-a CT confirmed a distal small bowel obstruction. Resolved with bowel rest.   12. Depression. She is currently on Zoloft 100 mg daily.   13. Elevated blood pressure. Currently on Norvasc 5 mg daily.   14. Admission 03/20/2015 with nausea/vomiting-CT consistent with a small bowel obstruction, likely related to peritoneal tumor  Disposition:  She continues to have abdominal pain and nausea. The abdomen appears more distended. We will refer her for a diagnostic and therapeutic paracentesis.  She will continue Coumadin anticoagulation. The plan is to continue palliative radiation with concurrent infusional 5-fluorouracil.  Her performance status is poor. I discussed Hospice care with Ms. Ante. She agrees to a [facility] referral. We discussed CPR and ACLS issues with an interpreter and she would like to remain on a full CODE STATUS for now.  She will return for an office visit in one week. The plan is to discontinue treatment and proceed with Hospice and comfort care if her clinical condition worsens.  Betsy Coder, MD  04/17/2015  9:37 AM

## 2015-04-17 NOTE — Patient Instructions (Signed)
Change Coumadin dose to 2mg  on Mon, Wed and Fri and 4mg  on the other days (Tue, Thur, Sat and Sun).  We will see you during your infusion on 04/24/15.

## 2015-04-17 NOTE — Patient Instructions (Signed)
[facility] Discharge Instructions for Patients Receiving Chemotherapy  Today you received the following chemotherapy agents fluorouricil  To help prevent nausea and vomiting after your treatment, we encourage you to take your nausea medication    If you develop nausea and vomiting that is not controlled by your nausea medication, call the clinic.   BELOW ARE SYMPTOMS THAT SHOULD BE REPORTED IMMEDIATELY:  *FEVER GREATER THAN 100.5 F  *CHILLS WITH OR WITHOUT FEVER  NAUSEA AND VOMITING THAT IS NOT CONTROLLED WITH YOUR NAUSEA MEDICATION  *UNUSUAL SHORTNESS OF BREATH  *UNUSUAL BRUISING OR BLEEDING  TENDERNESS IN MOUTH AND THROAT WITH OR WITHOUT PRESENCE OF ULCERS  *URINARY PROBLEMS  *BOWEL PROBLEMS  UNUSUAL RASH Items with * indicate a potential emergency and should be followed up as soon as possible.  Feel free to call the clinic you have any questions or concerns. The clinic phone number is [phone].  Please show the CHEMO ALERT CARD at check-in to the Emergency Department and triage nurse.   

## 2015-04-17 NOTE — Progress Notes (Signed)
Pt seen during her infusion today with and interpreter She missed a dose on Tue INR=1.6 on 2mg  daily No changes to report Will get weekly dose closer to 24 mg with this current dose change  Change Coumadin dose to 2mg  on Mon, Wed and Fri and 4mg  on the other days (Tue, Thur, Sat and Sun).   We will see you during your infusion on 04/24/15.

## 2015-04-17 NOTE — Telephone Encounter (Signed)
Added appt per pof...the patient will get sched in chemo °

## 2015-04-18 ENCOUNTER — Ambulatory Visit
Admission: RE | Admit: 2015-04-18 | Discharge: 2015-04-18 | Disposition: A | Discharge status: Home / Self Care | Payer: Medicaid Other | Source: Ambulatory Visit | Attending: Radiation Oncology | Admitting: Radiation Oncology

## 2015-04-18 ENCOUNTER — Ambulatory Visit (HOSPITAL_COMMUNITY)
Admission: RE | Admit: 2015-04-18 | Discharge: 2015-04-18 | Disposition: A | Discharge status: Home / Self Care | Payer: Medicaid Other | Source: Ambulatory Visit | Attending: Oncology | Admitting: Oncology

## 2015-04-18 ENCOUNTER — Other Ambulatory Visit: Payer: Self-pay | Admitting: *Deleted

## 2015-04-18 ENCOUNTER — Encounter: Payer: Self-pay | Admitting: Radiation Oncology

## 2015-04-18 ENCOUNTER — Other Ambulatory Visit: Payer: Self-pay | Admitting: Oncology

## 2015-04-18 ENCOUNTER — Ambulatory Visit: Payer: Medicaid Other | Admitting: Radiation Oncology

## 2015-04-18 VITALS — BP 138/103 | HR 122 | Temp 98.8°F | Resp 20 | Wt 135.1 lb

## 2015-04-18 DIAGNOSIS — K5660 Unspecified intestinal obstruction: Principal | ICD-10-CM | POA: Diagnosis present

## 2015-04-18 DIAGNOSIS — Z833 Family history of diabetes mellitus: Secondary | ICD-10-CM

## 2015-04-18 DIAGNOSIS — Z90710 Acquired absence of both cervix and uterus: Secondary | ICD-10-CM

## 2015-04-18 DIAGNOSIS — Z79891 Long term (current) use of opiate analgesic: Secondary | ICD-10-CM

## 2015-04-18 DIAGNOSIS — C169 Malignant neoplasm of stomach, unspecified: Secondary | ICD-10-CM | POA: Diagnosis present

## 2015-04-18 DIAGNOSIS — Z6824 Body mass index (BMI) 24.0-24.9, adult: Secondary | ICD-10-CM

## 2015-04-18 DIAGNOSIS — Z86711 Personal history of pulmonary embolism: Secondary | ICD-10-CM

## 2015-04-18 DIAGNOSIS — D72819 Decreased white blood cell count, unspecified: Secondary | ICD-10-CM | POA: Diagnosis present

## 2015-04-18 DIAGNOSIS — K565 Intestinal adhesions [bands] with obstruction (postprocedural) (postinfection): Secondary | ICD-10-CM | POA: Diagnosis present

## 2015-04-18 DIAGNOSIS — Z7901 Long term (current) use of anticoagulants: Secondary | ICD-10-CM

## 2015-04-18 DIAGNOSIS — Z79899 Other long term (current) drug therapy: Secondary | ICD-10-CM

## 2015-04-18 DIAGNOSIS — D6481 Anemia due to antineoplastic chemotherapy: Secondary | ICD-10-CM | POA: Diagnosis present

## 2015-04-18 DIAGNOSIS — C799 Secondary malignant neoplasm of unspecified site: Secondary | ICD-10-CM | POA: Diagnosis present

## 2015-04-18 DIAGNOSIS — R532 Functional quadriplegia: Secondary | ICD-10-CM | POA: Diagnosis present

## 2015-04-18 DIAGNOSIS — R188 Other ascites: Secondary | ICD-10-CM | POA: Insufficient documentation

## 2015-04-18 DIAGNOSIS — E860 Dehydration: Secondary | ICD-10-CM | POA: Diagnosis present

## 2015-04-18 DIAGNOSIS — Z510 Encounter for antineoplastic radiation therapy: Secondary | ICD-10-CM | POA: Diagnosis not present

## 2015-04-18 DIAGNOSIS — T451X5A Adverse effect of antineoplastic and immunosuppressive drugs, initial encounter: Secondary | ICD-10-CM | POA: Diagnosis present

## 2015-04-18 DIAGNOSIS — I2699 Other pulmonary embolism without acute cor pulmonale: Secondary | ICD-10-CM | POA: Diagnosis present

## 2015-04-18 DIAGNOSIS — G8929 Other chronic pain: Secondary | ICD-10-CM | POA: Diagnosis present

## 2015-04-18 DIAGNOSIS — R1110 Vomiting, unspecified: Secondary | ICD-10-CM

## 2015-04-18 DIAGNOSIS — D630 Anemia in neoplastic disease: Secondary | ICD-10-CM | POA: Diagnosis present

## 2015-04-18 DIAGNOSIS — N1330 Unspecified hydronephrosis: Secondary | ICD-10-CM | POA: Diagnosis present

## 2015-04-18 DIAGNOSIS — E871 Hypo-osmolality and hyponatremia: Secondary | ICD-10-CM | POA: Diagnosis present

## 2015-04-18 DIAGNOSIS — E43 Unspecified severe protein-calorie malnutrition: Secondary | ICD-10-CM | POA: Diagnosis present

## 2015-04-18 DIAGNOSIS — Z923 Personal history of irradiation: Secondary | ICD-10-CM

## 2015-04-18 DIAGNOSIS — Z8249 Family history of ischemic heart disease and other diseases of the circulatory system: Secondary | ICD-10-CM

## 2015-04-18 DIAGNOSIS — Z888 Allergy status to other drugs, medicaments and biological substances status: Secondary | ICD-10-CM

## 2015-04-18 DIAGNOSIS — Z66 Do not resuscitate: Secondary | ICD-10-CM | POA: Diagnosis present

## 2015-04-18 DIAGNOSIS — K529 Noninfective gastroenteritis and colitis, unspecified: Secondary | ICD-10-CM | POA: Diagnosis present

## 2015-04-18 DIAGNOSIS — Z515 Encounter for palliative care: Secondary | ICD-10-CM

## 2015-04-18 NOTE — Progress Notes (Signed)
Patient ID: Alicia [name], female   DOB: 1960/12/07, 54 y.o.   MRN: [id number] Patient presented to ultrasound department today for paracentesis. On limited ultrasound of abdomen in all 4 quadrants there is only a trace amount of ascites present primarily in the right upper/ mid quadrant and not amenable to safely tap at this time ,especially while on Coumadin therapy. Dr. Malachi Pro notified.

## 2015-04-18 NOTE — Progress Notes (Signed)
Department of Radiation Oncology  Phone:  [phone] Fax:        [phone]  Weekly Treatment Note    Name: Alicia [name] Date: 04/21/2015 MRN: [id number] DOB: June 14, 1961   Current dose: 20 Gy  Current fraction: 8   MEDICATIONS: No current facility-administered medications for this encounter.   Current Outpatient Prescriptions  Medication Sig Dispense Refill  . [DISCONTINUED] rivaroxaban (XARELTO) 20 MG TABS tablet Take 1 tablet (20 mg total) by mouth daily with supper. 30 tablet 1   Facility-Administered Medications Ordered in Other Encounters  Medication Dose Route Frequency Provider Last Rate Last Dose  . acetaminophen (TYLENOL) tablet 650 mg  650 mg Oral Q6H PRN Toy Baker, MD       Or  . acetaminophen (TYLENOL) suppository 650 mg  650 mg Rectal Q6H PRN Toy Baker, MD      . dexamethasone (DECADRON) injection 4 mg  4 mg Intravenous Q12H Micheline Rough, MD   4 mg at 04/21/15 0924  . diphenhydrAMINE (BENADRYL) injection 12.5 mg  12.5 mg Intravenous Q6H PRN Micheline Rough, MD       Or  . diphenhydrAMINE (BENADRYL) 12.5 MG/5ML elixir 12.5 mg  12.5 mg Oral Q6H PRN Gene Domingo Cocking, MD      . hydrALAZINE (APRESOLINE) injection 5 mg  5 mg Intravenous Q4H PRN Theodis Blaze, MD   5 mg at 04/21/15 3154  . HYDROmorphone (DILAUDID) PCA injection 0.3 mg/mL   Intravenous 6 times per day Micheline Rough, MD      . metoCLOPramide (REGLAN) injection 5 mg  5 mg Intravenous 3 times per day Micheline Rough, MD   5 mg at 04/21/15 0651  . naloxone [facility]) injection 0.4 mg  0.4 mg Intravenous PRN Micheline Rough, MD       And  . sodium chloride 0.9 % injection 9 mL  9 mL Intravenous PRN Gene Domingo Cocking, MD      . ondansetron (ZOFRAN) tablet 4 mg  4 mg Oral Q6H PRN Toy Baker, MD       Or  . ondansetron (ZOFRAN) injection 4 mg  4 mg Intravenous Q6H PRN Toy Baker, MD   4 mg at 04/21/15 0742  . phenol (CHLORASEPTIC) mouth spray 1 spray  1 spray Mouth/Throat PRN Dianne Dun, NP      . sodium chloride 0.9 % injection 10 mL  10 mL Intracatheter PRN Ladell Pier, MD      . sodium chloride 0.9 % injection 10 mL  10 mL Intravenous PRN Ladell Pier, MD   10 mL at 01/16/15 1126  . sodium chloride 0.9 % injection 3 mL  3 mL Intravenous Q12H Toy Baker, MD   3 mL at 04/19/15 2200     ALLERGIES: Xarelto   LABORATORY DATA:  Lab Results  Component Value Date   WBC 2.9* 04/21/2015   HGB 12.3 04/21/2015   HCT 37.9 04/21/2015   MCV 92.2 04/21/2015   PLT 320 04/21/2015   Lab Results  Component Value Date   NA 135 04/21/2015   K 4.0 04/21/2015   CL 103 04/21/2015   CO2 22 04/21/2015   Lab Results  Component Value Date   ALT 18 04/21/2015   AST 23 04/21/2015   ALKPHOS 304* 04/21/2015   BILITOT 1.1 04/21/2015     NARRATIVE: Alicia [name] was seen today for weekly treatment management. The chart was checked and the patient's films were reviewed.  Weekly rad txts 8/15 completed. The pt presents with  a hospital provided translator, Gregary Signs. She c/o nausea, abdominal pain, diarrhea x4. Took dilaudid earlier and took zofran 9AM today. She says she is taking medicine for the diarrhea. Says she took 2 pills of that Rx today and the medication is not listed in Epic. She thinks it's Lomotil. She reports a poor appetite and fatigue and the pain is an 8/10.  No abdominal gas and she is not taking Gas-X. Has 42fu infusing in the right subclavian and it is intact. Has a bloated abdomen. The patient is not having any new problems she states at this point.   PHYSICAL EXAMINATION: weight is 135 lb 1.6 oz (61.281 kg). Her oral temperature is 98.8 F (37.1 C). Her blood pressure is 138/103 and her pulse is 122. Her respiration is 20.  She presents to the clinic in a wheelchair.  ASSESSMENT: The patient is doing satisfactorily with treatment.  PLAN: We will continue with the patient's radiation treatment as planned. Continued abdominal pain. We  discussed the time frame of possible improvement. Will continue dilaudid for pain. Pt stopped Gas-X medication as it did not provide relief.  This document serves as a record of services personally performed by Kyung Rudd, MD. It was created on his behalf by Darcus Austin, a trained medical scribe. The creation of this record is based on the scribe's personal observations and the provider's statements to them. This document has been checked and approved by the attending provider.

## 2015-04-18 NOTE — Progress Notes (Addendum)
Weekly rad tx8/15 completed, c/o nausea na pain abdomen, diarrhea x4, took dilaudid 5 minutes ago, took zofran  9am today,  Poor a[petite, high b/p and pulse pain 8/10 scale, no gas, has 14fu infusing right subclavian intact, abdomen bloated, not taking gas ex for gas fatigued says taking rx for diarrhea, took 2 today, not in epic thinks lomotil Gregary Signs Interpreter with patient and family 3:33 PM BP 138/103 mmHg  Pulse 122  Temp(Src) 98.8 F (37.1 C) (Oral)  Resp 20  Wt 135 lb 1.6 oz (61.281 kg)  Wt Readings from Last 3 Encounters:  04/18/15 135 lb 1.6 oz (61.281 kg)  04/17/15 135 lb 8 oz (61.462 kg)  04/16/15 135 lb 3.2 oz (61.326 kg)

## 2015-04-19 ENCOUNTER — Encounter (HOSPITAL_COMMUNITY): Payer: Self-pay | Admitting: Emergency Medicine

## 2015-04-19 ENCOUNTER — Emergency Department (HOSPITAL_COMMUNITY): Payer: Medicaid Other

## 2015-04-19 ENCOUNTER — Other Ambulatory Visit (HOSPITAL_COMMUNITY): Payer: Self-pay

## 2015-04-19 ENCOUNTER — Other Ambulatory Visit: Payer: Self-pay

## 2015-04-19 ENCOUNTER — Inpatient Hospital Stay (HOSPITAL_COMMUNITY)
Admission: EM | Admit: 2015-04-19 | Discharge: 2015-04-24 | DRG: 388 | Disposition: A | Discharge status: Hospice | Payer: Medicaid Other | Attending: Internal Medicine | Admitting: Internal Medicine

## 2015-04-19 DIAGNOSIS — Z923 Personal history of irradiation: Secondary | ICD-10-CM | POA: Diagnosis not present

## 2015-04-19 DIAGNOSIS — N1330 Unspecified hydronephrosis: Secondary | ICD-10-CM | POA: Diagnosis present

## 2015-04-19 DIAGNOSIS — K56609 Unspecified intestinal obstruction, unspecified as to partial versus complete obstruction: Secondary | ICD-10-CM

## 2015-04-19 DIAGNOSIS — E43 Unspecified severe protein-calorie malnutrition: Secondary | ICD-10-CM | POA: Diagnosis not present

## 2015-04-19 DIAGNOSIS — R1013 Epigastric pain: Secondary | ICD-10-CM | POA: Diagnosis not present

## 2015-04-19 DIAGNOSIS — Z5181 Encounter for therapeutic drug level monitoring: Secondary | ICD-10-CM | POA: Diagnosis not present

## 2015-04-19 DIAGNOSIS — R1110 Vomiting, unspecified: Secondary | ICD-10-CM | POA: Diagnosis not present

## 2015-04-19 DIAGNOSIS — Z7901 Long term (current) use of anticoagulants: Secondary | ICD-10-CM | POA: Diagnosis not present

## 2015-04-19 DIAGNOSIS — D6481 Anemia due to antineoplastic chemotherapy: Secondary | ICD-10-CM | POA: Diagnosis present

## 2015-04-19 DIAGNOSIS — I2699 Other pulmonary embolism without acute cor pulmonale: Secondary | ICD-10-CM | POA: Diagnosis present

## 2015-04-19 DIAGNOSIS — Z86711 Personal history of pulmonary embolism: Secondary | ICD-10-CM | POA: Diagnosis not present

## 2015-04-19 DIAGNOSIS — Z79891 Long term (current) use of opiate analgesic: Secondary | ICD-10-CM | POA: Diagnosis not present

## 2015-04-19 DIAGNOSIS — C799 Secondary malignant neoplasm of unspecified site: Secondary | ICD-10-CM | POA: Diagnosis present

## 2015-04-19 DIAGNOSIS — E871 Hypo-osmolality and hyponatremia: Secondary | ICD-10-CM | POA: Diagnosis present

## 2015-04-19 DIAGNOSIS — E860 Dehydration: Secondary | ICD-10-CM | POA: Diagnosis not present

## 2015-04-19 DIAGNOSIS — R112 Nausea with vomiting, unspecified: Secondary | ICD-10-CM | POA: Diagnosis not present

## 2015-04-19 DIAGNOSIS — Z6824 Body mass index (BMI) 24.0-24.9, adult: Secondary | ICD-10-CM | POA: Diagnosis not present

## 2015-04-19 DIAGNOSIS — Z90710 Acquired absence of both cervix and uterus: Secondary | ICD-10-CM | POA: Diagnosis not present

## 2015-04-19 DIAGNOSIS — Z8249 Family history of ischemic heart disease and other diseases of the circulatory system: Secondary | ICD-10-CM | POA: Diagnosis not present

## 2015-04-19 DIAGNOSIS — Z510 Encounter for antineoplastic radiation therapy: Secondary | ICD-10-CM | POA: Diagnosis present

## 2015-04-19 DIAGNOSIS — Z515 Encounter for palliative care: Secondary | ICD-10-CM | POA: Diagnosis not present

## 2015-04-19 DIAGNOSIS — Z9221 Personal history of antineoplastic chemotherapy: Secondary | ICD-10-CM | POA: Diagnosis not present

## 2015-04-19 DIAGNOSIS — D630 Anemia in neoplastic disease: Secondary | ICD-10-CM | POA: Diagnosis present

## 2015-04-19 DIAGNOSIS — T451X5A Adverse effect of antineoplastic and immunosuppressive drugs, initial encounter: Secondary | ICD-10-CM | POA: Diagnosis present

## 2015-04-19 DIAGNOSIS — K5660 Unspecified intestinal obstruction: Secondary | ICD-10-CM | POA: Diagnosis not present

## 2015-04-19 DIAGNOSIS — K529 Noninfective gastroenteritis and colitis, unspecified: Secondary | ICD-10-CM | POA: Diagnosis present

## 2015-04-19 DIAGNOSIS — Z66 Do not resuscitate: Secondary | ICD-10-CM | POA: Diagnosis present

## 2015-04-19 DIAGNOSIS — C169 Malignant neoplasm of stomach, unspecified: Secondary | ICD-10-CM | POA: Diagnosis not present

## 2015-04-19 DIAGNOSIS — R109 Unspecified abdominal pain: Secondary | ICD-10-CM | POA: Diagnosis present

## 2015-04-19 DIAGNOSIS — Z7189 Other specified counseling: Secondary | ICD-10-CM

## 2015-04-19 DIAGNOSIS — K5669 Other intestinal obstruction: Secondary | ICD-10-CM | POA: Diagnosis not present

## 2015-04-19 DIAGNOSIS — K565 Intestinal adhesions [bands] with obstruction (postprocedural) (postinfection): Secondary | ICD-10-CM | POA: Diagnosis present

## 2015-04-19 DIAGNOSIS — D72819 Decreased white blood cell count, unspecified: Secondary | ICD-10-CM | POA: Diagnosis present

## 2015-04-19 DIAGNOSIS — Z79899 Other long term (current) drug therapy: Secondary | ICD-10-CM | POA: Diagnosis not present

## 2015-04-19 DIAGNOSIS — G8929 Other chronic pain: Secondary | ICD-10-CM | POA: Diagnosis present

## 2015-04-19 DIAGNOSIS — Z888 Allergy status to other drugs, medicaments and biological substances status: Secondary | ICD-10-CM | POA: Diagnosis not present

## 2015-04-19 DIAGNOSIS — R180 Malignant ascites: Secondary | ICD-10-CM | POA: Diagnosis not present

## 2015-04-19 DIAGNOSIS — R532 Functional quadriplegia: Secondary | ICD-10-CM | POA: Diagnosis present

## 2015-04-19 DIAGNOSIS — Z833 Family history of diabetes mellitus: Secondary | ICD-10-CM | POA: Diagnosis not present

## 2015-04-19 LAB — CBC WITH DIFFERENTIAL/PLATELET
BASOS ABS: 0 10*3/uL (ref 0.0–0.1)
BASOS PCT: 0 % (ref 0–1)
EOS ABS: 0 10*3/uL (ref 0.0–0.7)
Eosinophils Relative: 0 % (ref 0–5)
HCT: 36.8 % (ref 36.0–46.0)
HEMOGLOBIN: 12.3 g/dL (ref 12.0–15.0)
LYMPHS ABS: 0.5 10*3/uL — AB (ref 0.7–4.0)
Lymphocytes Relative: 10 % — ABNORMAL LOW (ref 12–46)
MCH: 29.9 pg (ref 26.0–34.0)
MCHC: 33.4 g/dL (ref 30.0–36.0)
MCV: 89.5 fL (ref 78.0–100.0)
Monocytes Absolute: 0.6 10*3/uL (ref 0.1–1.0)
Monocytes Relative: 11 % (ref 3–12)
NEUTROS PCT: 79 % — AB (ref 43–77)
Neutro Abs: 4.4 10*3/uL (ref 1.7–7.7)
Platelets: 312 10*3/uL (ref 150–400)
RBC: 4.11 MIL/uL (ref 3.87–5.11)
RDW: 19.9 % — ABNORMAL HIGH (ref 11.5–15.5)
WBC: 5.6 10*3/uL (ref 4.0–10.5)

## 2015-04-19 LAB — URINALYSIS, ROUTINE W REFLEX MICROSCOPIC
Glucose, UA: NEGATIVE mg/dL
Hgb urine dipstick: NEGATIVE
KETONES UR: 40 mg/dL — AB
LEUKOCYTES UA: NEGATIVE
Nitrite: NEGATIVE
PROTEIN: NEGATIVE mg/dL
Specific Gravity, Urine: 1.017 (ref 1.005–1.030)
UROBILINOGEN UA: 1 mg/dL (ref 0.0–1.0)
pH: 5.5 (ref 5.0–8.0)

## 2015-04-19 LAB — COMPREHENSIVE METABOLIC PANEL
ALT: 19 U/L (ref 14–54)
AST: 28 U/L (ref 15–41)
Albumin: 3.4 g/dL — ABNORMAL LOW (ref 3.5–5.0)
Alkaline Phosphatase: 395 U/L — ABNORMAL HIGH (ref 38–126)
Anion gap: 9 (ref 5–15)
BUN: 21 mg/dL — ABNORMAL HIGH (ref 6–20)
CALCIUM: 9.1 mg/dL (ref 8.9–10.3)
CHLORIDE: 96 mmol/L — AB (ref 101–111)
CO2: 26 mmol/L (ref 22–32)
CREATININE: 0.6 mg/dL (ref 0.44–1.00)
Glucose, Bld: 118 mg/dL — ABNORMAL HIGH (ref 65–99)
Potassium: 4.6 mmol/L (ref 3.5–5.1)
Sodium: 131 mmol/L — ABNORMAL LOW (ref 135–145)
TOTAL PROTEIN: 6.8 g/dL (ref 6.5–8.1)
Total Bilirubin: 1 mg/dL (ref 0.3–1.2)

## 2015-04-19 LAB — I-STAT CG4 LACTIC ACID, ED
LACTIC ACID, VENOUS: 0.71 mmol/L (ref 0.5–2.0)
Lactic Acid, Venous: 0.46 mmol/L — ABNORMAL LOW (ref 0.5–2.0)

## 2015-04-19 LAB — PROTIME-INR
INR: 2.59 — AB (ref 0.00–1.49)
PROTHROMBIN TIME: 27.4 s — AB (ref 11.6–15.2)

## 2015-04-19 MED ORDER — HYDROMORPHONE HCL 1 MG/ML IJ SOLN
1.0000 mg | INTRAMUSCULAR | Status: DC | PRN
  Administered 2015-04-19 – 2015-04-20 (×4): 1 mg via INTRAVENOUS
  Filled 2015-04-19 (×4): qty 1

## 2015-04-19 MED ORDER — SODIUM CHLORIDE 0.9 % IJ SOLN: 3.0000 mL | Freq: Two times a day (BID) | INTRAMUSCULAR | Status: DC

## 2015-04-19 MED ORDER — HYDROMORPHONE HCL 1 MG/ML IJ SOLN
1.0000 mg | Freq: Once | INTRAMUSCULAR | Status: AC
  Administered 2015-04-19: 1 mg via INTRAVENOUS
  Filled 2015-04-19: qty 1

## 2015-04-19 MED ORDER — ACETAMINOPHEN 325 MG PO TABS: 650.0000 mg | ORAL_TABLET | Freq: Four times a day (QID) | ORAL | Status: DC | PRN

## 2015-04-19 MED ORDER — ONDANSETRON HCL 4 MG PO TABS: 4.0000 mg | ORAL_TABLET | Freq: Four times a day (QID) | ORAL | Status: DC | PRN

## 2015-04-19 MED ORDER — IOHEXOL 300 MG/ML  SOLN
50.0000 mL | Freq: Once | INTRAMUSCULAR | Status: AC | PRN
  Administered 2015-04-19: 50 mL via ORAL

## 2015-04-19 MED ORDER — ACETAMINOPHEN 650 MG RE SUPP: 650.0000 mg | Freq: Four times a day (QID) | RECTAL | Status: DC | PRN

## 2015-04-19 MED ORDER — SODIUM CHLORIDE 0.9 % IV BOLUS (SEPSIS)
1000.0000 mL | Freq: Once | INTRAVENOUS | Status: AC
  Administered 2015-04-19: 1000 mL via INTRAVENOUS

## 2015-04-19 MED ORDER — PHENOL 1.4 % MT LIQD
1.0000 | OROMUCOSAL | Status: DC | PRN
  Filled 2015-04-19: qty 177

## 2015-04-19 MED ORDER — ONDANSETRON HCL 4 MG/2ML IJ SOLN
4.0000 mg | Freq: Four times a day (QID) | INTRAMUSCULAR | Status: DC | PRN
  Administered 2015-04-19 – 2015-04-23 (×6): 4 mg via INTRAVENOUS
  Filled 2015-04-19 (×6): qty 2

## 2015-04-19 MED ORDER — SODIUM CHLORIDE 0.9 % IV SOLN
INTRAVENOUS | Status: AC
  Administered 2015-04-20: 100 mL/h via INTRAVENOUS

## 2015-04-19 MED ORDER — IOHEXOL 300 MG/ML  SOLN
100.0000 mL | Freq: Once | INTRAMUSCULAR | Status: AC | PRN
  Administered 2015-04-19: 100 mL via INTRAVENOUS

## 2015-04-19 NOTE — H&P (Signed)
PCP: Minerva Ends, MD  [facility] Referring provider [place]   Chief Complaint: Abdominal pain and distention  HPI: Alicia [name] is a 54 y.o. female   has a past medical history of Cancer (Dx Mar 5,2015); PE (pulmonary embolism) (05/17/2014); Gastric cancer (06/06/2014); and Constipation (09/04/2014).   Presented with  Abdominal distention was diagnosed with small bowel obstruction.  Patient with history of gastric metastatic cancer currently undergoing active chemotherapy with 5-FU as well as radiation treatment. In May patient had distal small bowel obstruction secondary to adhesions which was treated with conservative management. She had repeat admission in 03/20/2015 again a small bowel obstruction. At this time secondary to peritoneal tumor this again was resolved with bowel rest.  Patient's past history significant for multiple intra-abdominal surgeries including bilateral all for ectomy, total abdominal hysterectomy, on the neck to May resection of abdominal wall and pelvic masses.  Patient also has chronic tachycardia up to 124. She has past history of pulmonary embolism currently on chronic Coumadin last INR 1.6 on September 1. Patient has history of chronic anemia secondary to chemotherapy requiring transfusion and beginning of August. The oncology note possibility of hospice has been discussed with patient. She agreed to have [facility] referral but still would like to be full code at this time.  On September 1 patient was evaluated as primary oncology office today she was having evidence of abdominal distention. It was thought that she needs  paracentesis and she was sent down to get an ultrasound done. Ultrasound showed only trace amount of ascites. She continued to have worsening abdominal pain nausea vomiting and her chronic diarrhea has worsened as well. Patient presented to emergency department. CT scan showed high-grade small bowel  obstruction with transition point appearing to live within the anterior upper pelvis as well as increasing moderate amount of ascites. ER provider has spoken to his surgery who recommended admission to medicine for conservative management. Of note patient is Spanish-speaking only Hospitalist was called for admission for small bowel obstruction  Review of Systems:    Pertinent positives include: abdominal pain, nausea, vomiting, diarrhea,  Constitutional:  No weight loss, night sweats, Fevers, chills, fatigue, weight loss  HEENT:  No headaches, Difficulty swallowing,Tooth/dental problems,Sore throat,  No sneezing, itching, ear ache, nasal congestion, post nasal drip,  Cardio-vascular:  No chest pain, Orthopnea, PND, anasarca, dizziness, palpitations.no Bilateral lower extremity swelling  GI:  No heartburn, indigestion,  change in bowel habits, loss of appetite, melena, blood in stool, hematemesis Resp:  no shortness of breath at rest. No dyspnea on exertion, No excess mucus, no productive cough, No non-productive cough, No coughing up of blood.No change in color of mucus.No wheezing. Skin:  no rash or lesions. No jaundice GU:  no dysuria, change in color of urine, no urgency or frequency. No straining to urinate.  No flank pain.  Musculoskeletal:  No joint pain or no joint swelling. No decreased range of motion. No back pain.  Psych:  No change in mood or affect. No depression or anxiety. No memory loss.  Neuro: no localizing neurological complaints, no tingling, no weakness, no double vision, no gait abnormality, no slurred speech, no confusion  Otherwise ROS are negative except for above, 10 systems were reviewed  Past Medical History: Past Medical History  Diagnosis Date  . 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 PE (pulmonary embolism) 05/17/2014  . Gastric cancer 06/06/2014  . Constipation 09/04/2014   Past  Surgical History  Procedure Laterality Date  .  Abdominal hysterectomy  10/18/2013      Medications: Prior to Admission medications   Medication Sig Start Date End Date Taking? Authorizing Provider  Alum & Mag Hydroxide-Simeth (MAGIC MOUTHWASH) SOLN Take 5 mLs by mouth 4 (four) times daily as needed for mouth pain. 11/19/14  Yes Ladell Pier, MD  amLODipine (NORVASC) 5 MG tablet Take 1 tablet (5 mg total) by mouth daily. 03/25/15  Yes Kelvin Cellar, MD  diphenoxylate-atropine (LOMOTIL) 2.5-0.025 MG per tablet Take 2 tablets by mouth 4 (four) times daily as needed for diarrhea or loose stools.   Yes Historical Provider, MD  HYDROmorphone (DILAUDID) 4 MG tablet Take 1-2 tablets (4-8 mg total) by mouth every 4 (four) hours as needed for severe pain. 04/10/15  Yes Ladell Pier, MD  pantoprazole (PROTONIX) 40 MG tablet Take 1 tablet (40 mg total) by mouth 2 (two) times daily. 01/15/15  Yes Janece Canterbury, MD  potassium chloride SA (K-DUR,KLOR-CON) 20 MEQ tablet Take 2 tablets (40 mEq total) by mouth daily. 03/25/15  Yes Kelvin Cellar, MD  prochlorperazine (COMPAZINE) 10 MG tablet Take 1 tablet (10 mg total) by mouth every 6 (six) hours as needed for nausea or vomiting. 09/03/14  Yes Susanne Borders, NP  sertraline (ZOLOFT) 100 MG tablet Take 1 tablet (100 mg total) by mouth daily. 05/17/14  Yes Boykin Nearing, MD  simethicone (MYLICON) 485 MG chewable tablet Chew 125 mg by mouth every 6 (six) hours as needed for flatulence.   Yes Historical Provider, MD  warfarin (COUMADIN) 2 MG tablet Take 2 mg by mouth every other day.    Yes Historical Provider, MD  warfarin (COUMADIN) 4 MG tablet Take 4 mg by mouth every other day.   Yes Historical Provider, MD  ondansetron (ZOFRAN) 4 MG tablet Take 1 tablet (4 mg total) by mouth every 6 (six) hours. Patient not taking: Reported on 04/19/2015 07/10/14   Dorie Rank, MD    Allergies:   Allergies  Allergen Reactions  . Xarelto [Rivaroxaban] Nausea And Vomiting, Palpitations and Other (See Comments)    Headaches  and fatigue, numbness in hands/feet and vomitting    Social History:  Ambulatory   Independently  Lives at home  With family     reports that she has never smoked. She has never used smokeless tobacco. She reports that she does not drink alcohol or use illicit drugs.    Family History: family history includes Diabetes in her sister; Heart disease in her brother, mother, and sister.    Physical Exam: Patient Vitals for the past 24 hrs:  BP Temp Temp src Pulse Resp SpO2 Weight  04/19/15 1900 (!) 154/105 mmHg - - 118 16 99 % -  04/19/15 1830 137/100 mmHg - - 113 22 96 % -  04/19/15 1800 143/98 mmHg - - 115 12 95 % -  04/19/15 1747 (!) 151/103 mmHg 98.4 F (36.9 C) Oral 119 20 99 % -  04/19/15 1700 (!) 163/108 mmHg - - (!) 121 13 97 % -  04/19/15 1551 - - - - - - 61.236 kg (135 lb)  04/19/15 1527 142/100 mmHg 98.4 F (36.9 C) Oral (!) 137 24 99 % -    1. General:  in No Acute distress 2. Psychological: Alert and  Oriented 3. Head/ENT:     Dry Mucous Membranes  Head Non traumatic, neck supple                          Normal   Dentition 4. SKIN:   decreased Skin turgor,  Skin clean Dry and intact no rash 5. Heart: Regular rate and rhythm no Murmur, Rub or gallop 6. Lungs: Clear to auscultation bilaterally, no wheezes or crackles   7. Abdomen:   tender,  distended 8. Lower extremities: no clubbing, cyanosis, or edema 9. Neurologically Grossly intact, moving all 4 extremities equally 10. MSK: Normal range of motion  body mass index is 24.69 kg/(m^2).   Labs on Admission:   Results for orders placed or performed during the hospital encounter of 04/19/15 (from the past 24 hour(s))  Comprehensive metabolic panel     Status: Abnormal   Collection Time: 04/19/15  4:15 PM  Result Value Ref Range   Sodium 131 (L) 135 - 145 mmol/L   Potassium 4.6 3.5 - 5.1 mmol/L   Chloride 96 (L) 101 - 111 mmol/L   CO2 26 22 - 32 mmol/L   Glucose, Bld 118 (H) 65 - 99  mg/dL   BUN 21 (H) 6 - 20 mg/dL   Creatinine, Ser 0.60 0.44 - 1.00 mg/dL   Calcium 9.1 8.9 - 10.3 mg/dL   Total Protein 6.8 6.5 - 8.1 g/dL   Albumin 3.4 (L) 3.5 - 5.0 g/dL   AST 28 15 - 41 U/L   ALT 19 14 - 54 U/L   Alkaline Phosphatase 395 (H) 38 - 126 U/L   Total Bilirubin 1.0 0.3 - 1.2 mg/dL   GFR calc non Af Amer >60 >60 mL/min   GFR calc Af Amer >60 >60 mL/min   Anion gap 9 5 - 15  CBC with Differential     Status: Abnormal   Collection Time: 04/19/15  4:15 PM  Result Value Ref Range   WBC 5.6 4.0 - 10.5 K/uL   RBC 4.11 3.87 - 5.11 MIL/uL   Hemoglobin 12.3 12.0 - 15.0 g/dL   HCT 36.8 36.0 - 46.0 %   MCV 89.5 78.0 - 100.0 fL   MCH 29.9 26.0 - 34.0 pg   MCHC 33.4 30.0 - 36.0 g/dL   RDW 19.9 (H) 11.5 - 15.5 %   Platelets 312 150 - 400 K/uL   Neutrophils Relative % 79 (H) 43 - 77 %   Neutro Abs 4.4 1.7 - 7.7 K/uL   Lymphocytes Relative 10 (L) 12 - 46 %   Lymphs Abs 0.5 (L) 0.7 - 4.0 K/uL   Monocytes Relative 11 3 - 12 %   Monocytes Absolute 0.6 0.1 - 1.0 K/uL   Eosinophils Relative 0 0 - 5 %   Eosinophils Absolute 0.0 0.0 - 0.7 K/uL   Basophils Relative 0 0 - 1 %   Basophils Absolute 0.0 0.0 - 0.1 K/uL  I-Stat CG4 Lactic Acid, ED (Not at [facility])     Status: None   Collection Time: 04/19/15  4:24 PM  Result Value Ref Range   Lactic Acid, Venous 0.71 0.5 - 2.0 mmol/L  Urinalysis, Routine w reflex microscopic (not at [facility])     Status: Abnormal   Collection Time: 04/19/15  5:15 PM  Result Value Ref Range   Color, Urine YELLOW YELLOW   APPearance CLOUDY (A) CLEAR   Specific Gravity, Urine 1.017 1.005 - 1.030   pH 5.5 5.0 - 8.0   Glucose, UA NEGATIVE NEGATIVE mg/dL  Hgb urine dipstick NEGATIVE NEGATIVE   Bilirubin Urine SMALL (A) NEGATIVE   Ketones, ur 40 (A) NEGATIVE mg/dL   Protein, ur NEGATIVE NEGATIVE mg/dL   Urobilinogen, UA 1.0 0.0 - 1.0 mg/dL   Nitrite NEGATIVE NEGATIVE   Leukocytes, UA NEGATIVE NEGATIVE    UA cloudy no evidence of UTI  No results found for:  HGBA1C  Estimated Creatinine Clearance: 68.4 mL/min (by C-G formula based on Cr of 0.6).  BNP (last 3 results) No results for input(s): PROBNP in the last 8760 hours.  Other results:  I have pearsonaly reviewed this: ECG REPORT  Rate: 132  Rhythm: Sinus tachycardia ST&T Change: No evidence of ischemic changes QTC 468  Filed Weights   04/19/15 1551  Weight: 61.236 kg (135 lb)     Cultures:    Component Value Date/Time   SDES URINE, CLEAN CATCH 01/14/2015 1341   SPECREQUEST NONE 01/14/2015 1341   CULT NO GROWTH Performed at [facility]  01/14/2015 1341   REPTSTATUS 01/16/2015 FINAL 01/14/2015 1341     Radiological Exams on Admission: Ct Abdomen Pelvis W Contrast  04/19/2015   CLINICAL DATA:  Abdominal pain, nausea vomiting and diarrhea for 3 days. The patient with stage IV gastric cancer.  EXAM: CT ABDOMEN AND PELVIS WITH CONTRAST  TECHNIQUE: Multidetector CT imaging of the abdomen and pelvis was performed using the standard protocol following bolus administration of intravenous contrast.  CONTRAST:  120mL OMNIPAQUE IOHEXOL 300 MG/ML  SOLN  COMPARISON:  03/20/2015 and prior CTs  FINDINGS: Lower chest:  A new small left pleural effusion is identified.  Hepatobiliary: The liver is unchanged. The gallbladder is mildly distended. There is mild periportal edema.  Pancreas: Unremarkable  Spleen: Unremarkable  Adrenals/Urinary Tract: Moderate right hydroureteronephrosis is unchanged with transition point near surgical clips in the mid right pelvis. The left kidney is unremarkable. The adrenal glands are unremarkable. Bladder is collapsed.  Stomach/Bowel: Dilated proximal and mid small bowel loops are identified with collapsed distal small bowel loops, compatible with high-grade small bowel obstruction. The transition point appears to lie within the anterior upper pelvis.  An annular constricting gastric mass is again identified and compatible with known gastric cancer. Peritoneal  and omental metastatic disease are again noted and not significantly changed. There is no evidence of abscess or pneumoperitoneum.  Vascular/Lymphatic: Prominent abdominal and retroperitoneal lymph nodes are unchanged. There is no evidence of abdominal aortic aneurysm.  Reproductive: Patient is status post hysterectomy.  Other: Increasing moderate ascites within the abdomen noted.  Musculoskeletal: Scattered sclerotic lesions within the visualized bones are unchanged.  IMPRESSION: High-grade small bowel obstruction with transition point appearing to lie within the anterior upper pelvis. No evidence of pneumoperitoneum.  Increasing moderate ascites and new small left pleural effusion.  Gastric mass and metastatic disease again noted.  Unchanged moderate right hydroureteronephrosis with transition point in the mid right pelvis.   Electronically Signed   By: Margarette Canada M.D.   On: 04/19/2015 18:24   US Abdomen Limited  04/18/2015   CLINICAL DATA:  Gastric malignancy, assess for ascites.  EXAM: LIMITED ABDOMEN ULTRASOUND FOR ASCITES  TECHNIQUE: Limited ultrasound survey for ascites was performed in all four abdominal quadrants.  COMPARISON:  Abdominal and pelvic CT scan dated March 20, 2015  FINDINGS: There is a small amount of [place] ascitic fluid in the right upper and lower quadrant and in the left lower quadrant. A trace fluid is present in the left upper quadrant. The volume present is felt to  be insufficient for paracentesis.  IMPRESSION: Small volume of ascites throughout the abdomen.   Electronically Signed   By: David  Martinique M.D.   On: 04/18/2015 10:56    Chart has been reviewed  Family  at  Bedside  plan of care was discussed with   Husband   Assessment/Plan  1 year old Spanish-speaking female with a history of metastatic gastric cancer with recurrent small bowel obstruction, omental caking, ascites, significant abdominal pain presents with evidence of small bowel obstruction. Surgery is aware  recommends conservative management. Discuss case with oncology who will see patient in house.   Present on Admission:  . SBO (small bowel obstruction) - we'll attempt to put NG tube for symptom relief. If patient is uncomfortable this could be discontinued later time. We will control pain with Dilaudid. Keep nothing by mouth. Conservative management with serial abdominal exams  . Dehydration will give IV fluids and monitor  . Gastric cancer oncology is aware, unfortunately given overall poor performance status patient appears to have poor prognosis. She is still undergoing home infusion chemotherapy as well as radiation therapy defer to oncology regarding discussion of goals of care   . PE (pulmonary embolism) - patient on Coumadin for treatment of remote PE. She is still high risk of blood clot secondary to hypercoagulable state. While nothing by mouth Will transition to Lovenox  . 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 At this point creatinine remains stable. Urine prior admission urology was consulted and stated that as long as creatinine is stable and no further intervention is indicated   Prophylaxis: Lovenox   CODE STATUS:  FULL CODE  as per patient   Disposition:   likely will need placement for rehabilitation, overall poor prognosis. Dr. Benay Spice has spoke to her regarding hospice and there was some initiation set this up. Patient is point still wishes to be full code prognosis unfortunately is very poor. If okay with oncology palliative care consult for goals of care assessment would be beneficial. Dr. Benay Spice to see patient on Monday                           Other plan as per orders.  I have spent a total of 65 min on this admission extra time was taken to discuss case with oncology  [place] 04/19/2015, 7:30 PM  [facility]  Pager [phone]   after 2 AM please page floor coverage PA If  7AM-7PM, please contact the day team taking care of the patient  [email or web address]  Password [email or web address]

## 2015-04-19 NOTE — ED Provider Notes (Addendum)
CSN: [id number]     Arrival date & time 04/19/15  1514 History   First MD Initiated Contact with Patient 04/19/15 1548     Chief Complaint  Patient presents with  . Tachycardia  . Abdominal Pain  . Emesis  . Diarrhea     (Consider location/radiation/quality/duration/timing/severity/associated sxs/prior Treatment) Patient is a 54 y.o. female presenting with abdominal pain. A language interpreter was used.  Abdominal Pain Pain location:  Epigastric Pain quality: aching, pressure and sharp   Pain radiates to:  Does not radiate Pain severity:  No pain Timing:  Constant Chronicity:  New Context: not alcohol use, not previous surgeries, not recent illness and not trauma   Associated symptoms: diarrhea, nausea and vomiting   Associated symptoms: no chest pain, no chills, no cough, no dysuria, no fever and no shortness of breath     Past Medical History  Diagnosis Date  . 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 PE (pulmonary embolism) 05/17/2014  . Gastric cancer 06/06/2014  . Constipation 09/04/2014   Past Surgical History  Procedure Laterality Date  . Abdominal hysterectomy  10/18/2013    Family History  Problem Relation Age of Onset  . Heart disease Mother   . Diabetes Sister   . Heart disease Sister   . Heart disease Brother    Social History  Substance Use Topics  . Smoking status: Never Smoker   . Smokeless tobacco: Never Used  . Alcohol Use: No   OB History    No data available     Review of Systems  Constitutional: Negative for fever and chills.  HENT: Negative for congestion and ear pain.   Eyes: Negative for pain.  Respiratory: Negative for cough and shortness of breath.   Cardiovascular: Negative for chest pain.  Gastrointestinal: Positive for nausea, vomiting, abdominal pain, diarrhea and abdominal distention.  Endocrine: Negative for polydipsia and polyuria.  Genitourinary: Negative for dysuria.  Skin: Negative for pallor and wound.   Neurological: Negative for numbness.  All other systems reviewed and are negative.     Allergies  Xarelto  Home Medications   Prior to Admission medications   Medication Sig Start Date End Date Taking? Authorizing Provider  Alum & Mag Hydroxide-Simeth (MAGIC MOUTHWASH) SOLN Take 5 mLs by mouth 4 (four) times daily as needed for mouth pain. 11/19/14   Ladell Pier, MD  amLODipine (NORVASC) 5 MG tablet Take 1 tablet (5 mg total) by mouth daily. 03/25/15   Kelvin Cellar, MD  hyaluronate sodium (RADIAPLEXRX) GEL Apply 1 application topically 2 (two) times daily.    Historical Provider, MD  HYDROmorphone (DILAUDID) 4 MG tablet Take 1-2 tablets (4-8 mg total) by mouth every 4 (four) hours as needed for severe pain. 04/10/15   Ladell Pier, MD  ondansetron (ZOFRAN) 4 MG tablet Take 1 tablet (4 mg total) by mouth every 6 (six) hours. 07/10/14   Dorie Rank, MD  pantoprazole (PROTONIX) 40 MG tablet Take 1 tablet (40 mg total) by mouth 2 (two) times daily. 01/15/15   Janece Canterbury, MD  potassium chloride SA (K-DUR,KLOR-CON) 20 MEQ tablet Take 2 tablets (40 mEq total) by mouth daily. 03/25/15   Kelvin Cellar, MD  prochlorperazine (COMPAZINE) 10 MG tablet Take 1 tablet (10 mg total) by mouth every 6 (six) hours as needed for nausea or vomiting. 09/03/14   Susanne Borders, NP  sertraline (ZOLOFT) 100 MG tablet Take 1 tablet (100 mg total) by mouth daily. 05/17/14  Boykin Nearing, MD  simethicone (MYLICON) 341 MG chewable tablet Chew 125 mg by mouth every 6 (six) hours as needed for flatulence.    Historical Provider, MD  warfarin (COUMADIN) 2 MG tablet Take 2 mg by mouth daily.    Historical Provider, MD   BP 142/100 mmHg  Pulse 137  Temp(Src) 98.4 F (36.9 C) (Oral)  Resp 24  SpO2 99% Physical Exam  Constitutional: She is oriented to person, place, and time. She appears well-developed and well-nourished. No distress.  HENT:  Head: Normocephalic and atraumatic.  Mouth/Throat: Mucous  membranes are dry.  Eyes: Pupils are equal, round, and reactive to light.  Cardiovascular: Tachycardia present.   Pulmonary/Chest: Effort normal and breath sounds normal.  Abdominal: She exhibits distension. There is tenderness. There is no rebound and no guarding.  Musculoskeletal: She exhibits no edema or tenderness.  Neurological: She is alert and oriented to person, place, and time.  Skin: She is not diaphoretic.  Nursing note and vitals reviewed.   ED Course  Procedures (including critical care time) Labs Review Labs Reviewed  COMPREHENSIVE METABOLIC PANEL - Abnormal; Notable for the following:    Sodium 131 (*)    Chloride 96 (*)    Glucose, Bld 118 (*)    BUN 21 (*)    Albumin 3.4 (*)    Alkaline Phosphatase 395 (*)    All other components within normal limits  CBC WITH DIFFERENTIAL/PLATELET - Abnormal; Notable for the following:    RDW 19.9 (*)    Neutrophils Relative % 79 (*)    Lymphocytes Relative 10 (*)    Lymphs Abs 0.5 (*)    All other components within normal limits  URINALYSIS, ROUTINE W REFLEX MICROSCOPIC (NOT AT [facility]) - Abnormal; Notable for the following:    APPearance CLOUDY (*)    Bilirubin Urine SMALL (*)    Ketones, ur 40 (*)    All other components within normal limits  PROTIME-INR - Abnormal; Notable for the following:    Prothrombin Time 27.4 (*)    INR 2.59 (*)    All other components within normal limits  I-STAT CG4 LACTIC ACID, ED - Abnormal; Notable for the following:    Lactic Acid, Venous 0.46 (*)    All other components within normal limits  CULTURE, BLOOD (ROUTINE X 2)  CULTURE, BLOOD (ROUTINE X 2)  URINE CULTURE  CREATININE, URINE, RANDOM  SODIUM, URINE, RANDOM  MAGNESIUM  PHOSPHORUS  TSH  COMPREHENSIVE METABOLIC PANEL  CBC  I-STAT CG4 LACTIC ACID, ED    Imaging Review Ct Abdomen Pelvis W Contrast  04/19/2015   CLINICAL DATA:  Abdominal pain, nausea vomiting and diarrhea for 3 days. The patient with stage IV gastric cancer.   EXAM: CT ABDOMEN AND PELVIS WITH CONTRAST  TECHNIQUE: Multidetector CT imaging of the abdomen and pelvis was performed using the standard protocol following bolus administration of intravenous contrast.  CONTRAST:  12mL OMNIPAQUE IOHEXOL 300 MG/ML  SOLN  COMPARISON:  03/20/2015 and prior CTs  FINDINGS: Lower chest:  A new small left pleural effusion is identified.  Hepatobiliary: The liver is unchanged. The gallbladder is mildly distended. There is mild periportal edema.  Pancreas: Unremarkable  Spleen: Unremarkable  Adrenals/Urinary Tract: Moderate right hydroureteronephrosis is unchanged with transition point near surgical clips in the mid right pelvis. The left kidney is unremarkable. The adrenal glands are unremarkable. Bladder is collapsed.  Stomach/Bowel: Dilated proximal and mid small bowel loops are identified with collapsed distal small bowel loops, compatible with  high-grade small bowel obstruction. The transition point appears to lie within the anterior upper pelvis.  An annular constricting gastric mass is again identified and compatible with known gastric cancer. Peritoneal and omental metastatic disease are again noted and not significantly changed. There is no evidence of abscess or pneumoperitoneum.  Vascular/Lymphatic: Prominent abdominal and retroperitoneal lymph nodes are unchanged. There is no evidence of abdominal aortic aneurysm.  Reproductive: Patient is status post hysterectomy.  Other: Increasing moderate ascites within the abdomen noted.  Musculoskeletal: Scattered sclerotic lesions within the visualized bones are unchanged.  IMPRESSION: High-grade small bowel obstruction with transition point appearing to lie within the anterior upper pelvis. No evidence of pneumoperitoneum.  Increasing moderate ascites and new small left pleural effusion.  Gastric mass and metastatic disease again noted.  Unchanged moderate right hydroureteronephrosis with transition point in the mid right pelvis.    Electronically Signed   By: Margarette Canada M.D.   On: 04/19/2015 18:24   US Abdomen Limited  04/18/2015   CLINICAL DATA:  Gastric malignancy, assess for ascites.  EXAM: LIMITED ABDOMEN ULTRASOUND FOR ASCITES  TECHNIQUE: Limited ultrasound survey for ascites was performed in all four abdominal quadrants.  COMPARISON:  Abdominal and pelvic CT scan dated March 20, 2015  FINDINGS: There is a small amount of [place] ascitic fluid in the right upper and lower quadrant and in the left lower quadrant. A trace fluid is present in the left upper quadrant. The volume present is felt to be insufficient for paracentesis.  IMPRESSION: Small volume of ascites throughout the abdomen.   Electronically Signed   By: David  Martinique M.D.   On: 04/18/2015 10:56   I have personally reviewed and evaluated these images and lab results as part of my medical decision-making.   EKG Interpretation None      MDM   Final diagnoses:  SBO (small bowel obstruction)  Dehydration  Epigastric pain  Intractable vomiting with nausea, vomiting of unspecified type   Concern for possible obstruction v infectious etiology for symptoms, possibly sepsis. Will eval with labs and ct. Fluid rehydration.   Ct with sbo, d/w surgery who recommends medical management. Discussed the case with hospitalist team who will admit the patient. They're requesting NG tube so we'll place one in the emergency department.   Merrily Pew, MD 04/20/15 [phone]

## 2015-04-19 NOTE — Consult Note (Signed)
General Surgery [facility]  Reason for Consult: small bowel obstruction secondary to metastatic gastric carcinoma  Referring Physician: ER physician, [facility]; Dr. Roel Cluck, [facility]  Alicia [name] is an 54 y.o. female.  HPI: patient is a 54 yo Hispanic female admitted from the ER with small bowel obstruction.  Patient has metastatic gastric carcinoma and has had recurrent obstructions, last seen by our practice during her admission in May 2016.  Patient has had extensive previous surgery at [facility] in [place].  She is currently undergoing chemotherapy directed by Dr. Julieanne Manson.  Patient presents to the ER with signs and symptoms of obstruction.  CT abdomen shows diffuse ascites, metastatic tumor, and a transition point consistent with high grade obstruction.  General surgery is asked to evaluate and follow.  Past Medical History  Diagnosis Date  . 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 PE (pulmonary embolism) 05/17/2014  . Gastric cancer 06/06/2014  . Constipation 09/04/2014    Past Surgical History  Procedure Laterality Date  . Abdominal hysterectomy  10/18/2013     Family History  Problem Relation Age of Onset  . Heart disease Mother   . Diabetes Sister   . Heart disease Sister   . Heart disease Brother     Social History:  reports that she has never smoked. She has never used smokeless tobacco. She reports that she does not drink alcohol or use illicit drugs.  Allergies:  Allergies  Allergen Reactions  . Xarelto [Rivaroxaban] Nausea And Vomiting, Palpitations and Other (See Comments)    Headaches and fatigue, numbness in hands/feet and vomitting    Medications: I have reviewed the patient's current medications.  Results for orders placed or performed during the hospital encounter of 04/19/15 (from the past 48 hour(s))  Comprehensive metabolic panel     Status: Abnormal   Collection Time:  04/19/15  4:15 PM  Result Value Ref Range   Sodium 131 (L) 135 - 145 mmol/L   Potassium 4.6 3.5 - 5.1 mmol/L   Chloride 96 (L) 101 - 111 mmol/L   CO2 26 22 - 32 mmol/L   Glucose, Bld 118 (H) 65 - 99 mg/dL   BUN 21 (H) 6 - 20 mg/dL   Creatinine, Ser 0.60 0.44 - 1.00 mg/dL   Calcium 9.1 8.9 - 10.3 mg/dL   Total Protein 6.8 6.5 - 8.1 g/dL   Albumin 3.4 (L) 3.5 - 5.0 g/dL   AST 28 15 - 41 U/L   ALT 19 14 - 54 U/L   Alkaline Phosphatase 395 (H) 38 - 126 U/L   Total Bilirubin 1.0 0.3 - 1.2 mg/dL   GFR calc non Af Amer >60 >60 mL/min   GFR calc Af Amer >60 >60 mL/min    Comment: (NOTE) The eGFR has been calculated using the CKD EPI equation. This calculation has not been validated in all clinical situations. eGFR's persistently <60 mL/min signify possible Chronic Kidney Disease.    Anion gap 9 5 - 15  CBC with Differential     Status: Abnormal   Collection Time: 04/19/15  4:15 PM  Result Value Ref Range   WBC 5.6 4.0 - 10.5 K/uL   RBC 4.11 3.87 - 5.11 MIL/uL   Hemoglobin 12.3 12.0 - 15.0 g/dL   HCT 36.8 36.0 - 46.0 %   MCV 89.5 78.0 - 100.0 fL   MCH 29.9 26.0 - 34.0 pg   MCHC 33.4 30.0 - 36.0  g/dL   RDW 19.9 (H) 11.5 - 15.5 %   Platelets 312 150 - 400 K/uL   Neutrophils Relative % 79 (H) 43 - 77 %   Neutro Abs 4.4 1.7 - 7.7 K/uL   Lymphocytes Relative 10 (L) 12 - 46 %   Lymphs Abs 0.5 (L) 0.7 - 4.0 K/uL   Monocytes Relative 11 3 - 12 %   Monocytes Absolute 0.6 0.1 - 1.0 K/uL   Eosinophils Relative 0 0 - 5 %   Eosinophils Absolute 0.0 0.0 - 0.7 K/uL   Basophils Relative 0 0 - 1 %   Basophils Absolute 0.0 0.0 - 0.1 K/uL  I-Stat CG4 Lactic Acid, ED (Not at [facility]     Status: None   Collection Time: 04/19/15  4:24 PM  Result Value Ref Range   Lactic Acid, Venous 0.71 0.5 - 2.0 mmol/L  Urinalysis, Routine w reflex microscopic (not at [facility])     Status: Abnormal   Collection Time: 04/19/15  5:15 PM  Result Value Ref Range   Color, Urine YELLOW YELLOW   APPearance CLOUDY (A)  CLEAR   Specific Gravity, Urine 1.017 1.005 - 1.030   pH 5.5 5.0 - 8.0   Glucose, UA NEGATIVE NEGATIVE mg/dL   Hgb urine dipstick NEGATIVE NEGATIVE   Bilirubin Urine SMALL (A) NEGATIVE   Ketones, ur 40 (A) NEGATIVE mg/dL   Protein, ur NEGATIVE NEGATIVE mg/dL   Urobilinogen, UA 1.0 0.0 - 1.0 mg/dL   Nitrite NEGATIVE NEGATIVE   Leukocytes, UA NEGATIVE NEGATIVE    Comment: MICROSCOPIC NOT DONE ON URINES WITH NEGATIVE PROTEIN, BLOOD, LEUKOCYTES, NITRITE, OR GLUCOSE <1000 mg/dL.  I-Stat CG4 Lactic Acid, ED (Not at [facility])     Status: Abnormal   Collection Time: 04/19/15  7:42 PM  Result Value Ref Range   Lactic Acid, Venous 0.46 (L) 0.5 - 2.0 mmol/L  Protime-INR     Status: Abnormal   Collection Time: 04/19/15  9:52 PM  Result Value Ref Range   Prothrombin Time 27.4 (H) 11.6 - 15.2 seconds   INR 2.59 (H) 0.00 - 1.49    Ct Abdomen Pelvis W Contrast  04/19/2015   CLINICAL DATA:  Abdominal pain, nausea vomiting and diarrhea for 3 days. The patient with stage IV gastric cancer.  EXAM: CT ABDOMEN AND PELVIS WITH CONTRAST  TECHNIQUE: Multidetector CT imaging of the abdomen and pelvis was performed using the standard protocol following bolus administration of intravenous contrast.  CONTRAST:  137m OMNIPAQUE IOHEXOL 300 MG/ML  SOLN  COMPARISON:  03/20/2015 and prior CTs  FINDINGS: Lower chest:  A new small left pleural effusion is identified.  Hepatobiliary: The liver is unchanged. The gallbladder is mildly distended. There is mild periportal edema.  Pancreas: Unremarkable  Spleen: Unremarkable  Adrenals/Urinary Tract: Moderate right hydroureteronephrosis is unchanged with transition point near surgical clips in the mid right pelvis. The left kidney is unremarkable. The adrenal glands are unremarkable. Bladder is collapsed.  Stomach/Bowel: Dilated proximal and mid small bowel loops are identified with collapsed distal small bowel loops, compatible with high-grade small bowel obstruction. The transition  point appears to lie within the anterior upper pelvis.  An annular constricting gastric mass is again identified and compatible with known gastric cancer. Peritoneal and omental metastatic disease are again noted and not significantly changed. There is no evidence of abscess or pneumoperitoneum.  Vascular/Lymphatic: Prominent abdominal and retroperitoneal lymph nodes are unchanged. There is no evidence of abdominal aortic aneurysm.  Reproductive: Patient is status post hysterectomy.  Other: Increasing moderate ascites within the abdomen noted.  Musculoskeletal: Scattered sclerotic lesions within the visualized bones are unchanged.  IMPRESSION: High-grade small bowel obstruction with transition point appearing to lie within the anterior upper pelvis. No evidence of pneumoperitoneum.  Increasing moderate ascites and new small left pleural effusion.  Gastric mass and metastatic disease again noted.  Unchanged moderate right hydroureteronephrosis with transition point in the mid right pelvis.   Electronically Signed   By: Margarette Canada M.D.   On: 04/19/2015 18:24   US Abdomen Limited  04/18/2015   CLINICAL DATA:  Gastric malignancy, assess for ascites.  EXAM: LIMITED ABDOMEN ULTRASOUND FOR ASCITES  TECHNIQUE: Limited ultrasound survey for ascites was performed in all four abdominal quadrants.  COMPARISON:  Abdominal and pelvic CT scan dated March 20, 2015  FINDINGS: There is a small amount of [place] ascitic fluid in the right upper and lower quadrant and in the left lower quadrant. A trace fluid is present in the left upper quadrant. The volume present is felt to be insufficient for paracentesis.  IMPRESSION: Small volume of ascites throughout the abdomen.   Electronically Signed   By: David  Martinique M.D.   On: 04/18/2015 10:56    Review of Systems  Constitutional: Positive for weight loss and malaise/fatigue.  HENT: Negative.   Eyes: Negative.   Respiratory: Negative.   Cardiovascular: Negative.   Gastrointestinal:  Positive for nausea, vomiting and abdominal pain.  Genitourinary: Negative.   Musculoskeletal: Negative.   Skin: Negative.   Neurological: Positive for weakness.  Endo/Heme/Allergies: Negative.   Psychiatric/Behavioral: Negative.    Blood pressure 154/86, pulse 111, temperature 98.1 F (36.7 C), temperature source Oral, resp. rate 18, height 5' 3"  (1.6 m), weight 62.143 kg (137 lb), SpO2 98 %. Physical Exam  Constitutional: She is oriented to person, place, and time. No distress.  HENT:  Head: Normocephalic and atraumatic.  Right Ear: External ear normal.  Left Ear: External ear normal.  Eyes: Conjunctivae are normal. Pupils are equal, round, and reactive to light.  Neck: Normal range of motion. Neck supple. No thyromegaly present.  Cardiovascular: Regular rhythm and normal heart sounds.   Respiratory: Effort normal and breath sounds normal. No respiratory distress. She has no wheezes.  GI: She exhibits distension and mass (at umbilicus). There is tenderness. There is no rebound and no guarding.  Moderate distension; well-healed midline incision; mild diffuse tenderness; palpable mass at umbilicus consistent with tumor  Musculoskeletal: Normal range of motion.  Neurological: She is alert and oriented to person, place, and time.  Skin: Skin is warm and dry. She is not diaphoretic.  Psychiatric: She has a normal mood and affect. Her behavior is normal.    Assessment/Plan:  SBO -Metastatic gastric cancer Stage IV. S/p bilateral oophorectomy, total abdominal hysterectomy, omentectomy, resection of abdominal wall and pelvic masses, bilateral pelvic lymphadenectomy 10/18/2013 with the pathology confirming moderate to poorly differentiated adenocarcinoma -no evidence of compromised bowel or indication for urgent surgical intervention -laparotomy will be reserved for failure to resolve her SBO, and only as a last resort -ambulate and IS  Chronic abdominal pain secondary to carcinomatosis  and gastric tumor  Agree with admission, NG decompression, IV hydration Will follow with you, although likely very few surgical options at this point  Earnstine Regal, MD, [facility] Office: [facility] 04/19/2015, 11:04 PM

## 2015-04-19 NOTE — ED Notes (Signed)
Abdominal pain, nausea, vomiting, diarrhea on-going for the last 3 days and worsening. Pt states epigastric pain. Pt has a central line and hx of stomach cancer, the port is accessed and attached to a bag the patient has with her. She states she takes chemo treatment at home, last chemo on Thursday, yesterday she went to radiation yesterday. Says she's been taking her prescribed dilaudid for her pain, she last took 2 mg at 12:45 today. Presents in obvious pain, tachycardic at 133, feels warm to touch but temp is 98.4 orally.

## 2015-04-19 NOTE — Progress Notes (Signed)
Pt arrives on unit into 1417 at 2130, in ED pt was STwith HR in the 120's and md aware per ER nurse.  Placed on telemetry here and her T waves are inverted with ?ST depression,  Replaced leads and EKG taken,  EKG  ST without inverted Tor depressions, Dr. Roel Cluck notified and she states she has looked at new EKG and ER EKG also and agrees.  Pt does continue to report moderate pain in abd (right greater than left) and nausea, but no changes in location or pain or character from ED. Abd continues to be distended and soft except around umbilicus.  (DR. Gerkin in and states the hard area around umbilicus is expected with this cancer)  Given dilaudid and Zofran and reports pain has decreased to "a low amount" of pain by 2300.  And nausea has also decreased  NGT placed at 2300, pt tolerated placement well and it returned 100cc's of pink/orange liquid immediately.  Placed on LIWS and pt is complaining that it is "hurts her nostril and throat"  And noted to be gaging intermittently.  Repositioned and secured to decrease irritation.  Pt is starting to relax and I have notified on call to ask for order or cepacol spray and ice chips.  Will continue to monitor.

## 2015-04-19 NOTE — ED Notes (Signed)
Adrucil chemo treatment is infusing through pt's accessed right chest portacath.

## 2015-04-20 ENCOUNTER — Other Ambulatory Visit: Payer: Self-pay | Admitting: Oncology

## 2015-04-20 DIAGNOSIS — R1013 Epigastric pain: Secondary | ICD-10-CM | POA: Insufficient documentation

## 2015-04-20 DIAGNOSIS — Z515 Encounter for palliative care: Secondary | ICD-10-CM | POA: Diagnosis present

## 2015-04-20 DIAGNOSIS — K5660 Unspecified intestinal obstruction: Principal | ICD-10-CM

## 2015-04-20 DIAGNOSIS — Z7901 Long term (current) use of anticoagulants: Secondary | ICD-10-CM

## 2015-04-20 DIAGNOSIS — Z5181 Encounter for therapeutic drug level monitoring: Secondary | ICD-10-CM

## 2015-04-20 LAB — COMPREHENSIVE METABOLIC PANEL
ALK PHOS: 324 U/L — AB (ref 38–126)
ALT: 17 U/L (ref 14–54)
ANION GAP: 9 (ref 5–15)
AST: 22 U/L (ref 15–41)
Albumin: 2.9 g/dL — ABNORMAL LOW (ref 3.5–5.0)
BILIRUBIN TOTAL: 1.3 mg/dL — AB (ref 0.3–1.2)
BUN: 16 mg/dL (ref 6–20)
CALCIUM: 8.5 mg/dL — AB (ref 8.9–10.3)
CO2: 25 mmol/L (ref 22–32)
Chloride: 98 mmol/L — ABNORMAL LOW (ref 101–111)
Creatinine, Ser: 0.55 mg/dL (ref 0.44–1.00)
GFR calc non Af Amer: >60 mL/min (ref >60)
Glucose, Bld: 101 mg/dL — ABNORMAL HIGH (ref 65–99)
Potassium: 3.7 mmol/L (ref 3.5–5.1)
Sodium: 132 mmol/L — ABNORMAL LOW (ref 135–145)
TOTAL PROTEIN: 5.9 g/dL — AB (ref 6.5–8.1)

## 2015-04-20 LAB — PROTIME-INR
INR: 2.73 — AB (ref 0.00–1.49)
PROTHROMBIN TIME: 28.5 s — AB (ref 11.6–15.2)

## 2015-04-20 LAB — TSH: TSH: 3.248 u[IU]/mL (ref 0.350–4.500)

## 2015-04-20 LAB — PHOSPHORUS: Phosphorus: 3.4 mg/dL (ref 2.5–4.6)

## 2015-04-20 LAB — CREATININE, URINE, RANDOM: CREATININE, URINE: 51 mg/dL

## 2015-04-20 LAB — CBC
HCT: 35.2 % — ABNORMAL LOW (ref 36.0–46.0)
HEMOGLOBIN: 11.2 g/dL — AB (ref 12.0–15.0)
MCH: 28.9 pg (ref 26.0–34.0)
MCHC: 31.8 g/dL (ref 30.0–36.0)
MCV: 90.7 fL (ref 78.0–100.0)
Platelets: 254 10*3/uL (ref 150–400)
RBC: 3.88 MIL/uL (ref 3.87–5.11)
RDW: 20.1 % — ABNORMAL HIGH (ref 11.5–15.5)
WBC: 3.9 10*3/uL — ABNORMAL LOW (ref 4.0–10.5)

## 2015-04-20 LAB — MAGNESIUM: MAGNESIUM: 1.9 mg/dL (ref 1.7–2.4)

## 2015-04-20 LAB — SODIUM, URINE, RANDOM: SODIUM UR: 42 mmol/L (ref 28–272)

## 2015-04-20 MED ORDER — SODIUM CHLORIDE 0.9 % IV SOLN
INTRAVENOUS | Status: AC
  Administered 2015-04-20: 15:00:00 via INTRAVENOUS
  Administered 2015-04-20: 100 mL/h via INTRAVENOUS
  Administered 2015-04-20: 18:00:00 via INTRAVENOUS

## 2015-04-20 MED ORDER — METOCLOPRAMIDE HCL 5 MG/ML IJ SOLN
5.0000 mg | Freq: Three times a day (TID) | INTRAMUSCULAR | Status: DC
  Administered 2015-04-20 – 2015-04-24 (×12): 5 mg via INTRAVENOUS
  Filled 2015-04-20: qty 2
  Filled 2015-04-20 (×3): qty 1
  Filled 2015-04-20: qty 2
  Filled 2015-04-20 (×8): qty 1
  Filled 2015-04-20: qty 2
  Filled 2015-04-20: qty 1

## 2015-04-20 MED ORDER — HYDROMORPHONE HCL 1 MG/ML IJ SOLN
0.5000 mg | Freq: Once | INTRAMUSCULAR | Status: AC
  Administered 2015-04-20: 0.5 mg via INTRAVENOUS
  Filled 2015-04-20: qty 1

## 2015-04-20 MED ORDER — SODIUM CHLORIDE 0.9 % IJ SOLN: 9.0000 mL | INTRAMUSCULAR | Status: DC | PRN

## 2015-04-20 MED ORDER — DIPHENHYDRAMINE HCL 12.5 MG/5ML PO ELIX: 12.5000 mg | ORAL_SOLUTION | Freq: Four times a day (QID) | ORAL | Status: DC | PRN

## 2015-04-20 MED ORDER — HYDROMORPHONE 0.3 MG/ML IV SOLN
INTRAVENOUS | Status: DC
  Administered 2015-04-20: 4.08 mg via INTRAVENOUS
  Administered 2015-04-20: 18:00:00 via INTRAVENOUS
  Administered 2015-04-20: 2.08 mg via INTRAVENOUS
  Administered 2015-04-21: 9.54 mg via INTRAVENOUS
  Administered 2015-04-21: 4.87 mg via INTRAVENOUS
  Administered 2015-04-21: 6.98 mg via INTRAVENOUS
  Administered 2015-04-21: 09:00:00 via INTRAVENOUS
  Administered 2015-04-21: 4.24 mg via INTRAVENOUS
  Administered 2015-04-21: 22:00:00 via INTRAVENOUS
  Administered 2015-04-22: 0.3 mg via INTRAVENOUS
  Administered 2015-04-22: 5.19 mg via INTRAVENOUS
  Administered 2015-04-22: 5.73 mg via INTRAVENOUS
  Administered 2015-04-22: 4.68 mg via INTRAVENOUS
  Administered 2015-04-22: 0.3 mg via INTRAVENOUS
  Administered 2015-04-22: 4.9 mg via INTRAVENOUS
  Administered 2015-04-22: 0.3 mg via INTRAVENOUS
  Administered 2015-04-22: 5.24 mg via INTRAVENOUS
  Administered 2015-04-23: 5.12 mg via INTRAVENOUS
  Administered 2015-04-23: 01:00:00 via INTRAVENOUS
  Administered 2015-04-23: 4.11 mg via INTRAVENOUS
  Administered 2015-04-23: 07:00:00 via INTRAVENOUS
  Filled 2015-04-20 (×12): qty 25

## 2015-04-20 MED ORDER — HYDRALAZINE HCL 20 MG/ML IJ SOLN
5.0000 mg | INTRAMUSCULAR | Status: DC | PRN
  Administered 2015-04-20 – 2015-04-23 (×6): 5 mg via INTRAVENOUS
  Filled 2015-04-20 (×6): qty 1

## 2015-04-20 MED ORDER — NALOXONE HCL 0.4 MG/ML IJ SOLN: 0.4000 mg | INTRAMUSCULAR | Status: DC | PRN

## 2015-04-20 MED ORDER — DIPHENHYDRAMINE HCL 50 MG/ML IJ SOLN: 12.5000 mg | Freq: Four times a day (QID) | INTRAMUSCULAR | Status: DC | PRN

## 2015-04-20 MED ORDER — DEXAMETHASONE SODIUM PHOSPHATE 4 MG/ML IJ SOLN
4.0000 mg | Freq: Two times a day (BID) | INTRAMUSCULAR | Status: DC
  Administered 2015-04-20 – 2015-04-23 (×7): 4 mg via INTRAVENOUS
  Filled 2015-04-20 (×9): qty 1

## 2015-04-20 MED ORDER — HYDROMORPHONE HCL 1 MG/ML IJ SOLN
1.0000 mg | INTRAMUSCULAR | Status: DC | PRN
  Administered 2015-04-20 (×2): 1 mg via INTRAVENOUS
  Filled 2015-04-20 (×3): qty 1

## 2015-04-20 MED ORDER — HYDROMORPHONE HCL 1 MG/ML IJ SOLN
1.0000 mg | INTRAMUSCULAR | Status: DC | PRN
  Administered 2015-04-20 (×2): 2 mg via INTRAVENOUS
  Filled 2015-04-20: qty 1
  Filled 2015-04-20: qty 2

## 2015-04-20 NOTE — Consult Note (Signed)
Referral MD  Reason for Referral: Metastatic Gastric Cancer - Progressive with SBO  Chief Complaint  Patient presents with  . Tachycardia  . Abdominal Pain  . Emesis  . Diarrhea  : Cannot speak much English  HPI: Alicia [name] is a very nice 54 year old Hispanic female. She, unfortunately, has metastatic gastric cancer. She has had issues with obstruction.  She currently is undergoing radiation therapy with infusional 5-FU. Dr. Victorino Sparrow is her oncologist. She has received about two thirds of her radiation.  She now is admitted with another olfaction. She has been seen by surgery. They are reluctant to do any intervention cause of the extent of her disease. Progression likely has extensive peritoneal disease that is continuing to the obstruction area and  She's not had any obvious bleeding. She is on Coumadin because of probable embolic disease. Her INR yesterday was 2.59. She currently is on Lovenox.    She is moderate ascites.  There is no obvious temperature.   Past Medical History  Diagnosis Date  . 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 PE (pulmonary embolism) 05/17/2014  . Gastric cancer 06/06/2014  . Constipation 09/04/2014  :  Past Surgical History  Procedure Laterality Date  . Abdominal hysterectomy  10/18/2013   :   Current facility-administered medications:  .  acetaminophen (TYLENOL) tablet 650 mg, 650 mg, Oral, Q6H PRN **OR** acetaminophen (TYLENOL) suppository 650 mg, 650 mg, Rectal, Q6H PRN, Toy Baker, MD .  HYDROmorphone (DILAUDID) injection 1 mg, 1 mg, Intravenous, Q2H PRN, Theodis Blaze, MD, 1 mg at 04/20/15 9629 .  ondansetron (ZOFRAN) tablet 4 mg, 4 mg, Oral, Q6H PRN **OR** ondansetron (ZOFRAN) injection 4 mg, 4 mg, Intravenous, Q6H PRN, Toy Baker, MD, 4 mg at 04/20/15 0332 .  phenol (CHLORASEPTIC) mouth spray 1 spray, 1 spray, Mouth/Throat, PRN, Dianne Dun, NP .  sodium chloride 0.9 % injection 3 mL, 3  mL, Intravenous, Q12H, Toy Baker, MD, 3 mL at 04/19/15 2200  Facility-Administered Medications Ordered in Other Encounters:  .  sodium chloride 0.9 % injection 10 mL, 10 mL, Intracatheter, PRN, Ladell Pier, MD .  sodium chloride 0.9 % injection 10 mL, 10 mL, Intravenous, PRN, Ladell Pier, MD, 10 mL at 01/16/15 1126:  . sodium chloride  3 mL Intravenous Q12H  :  Allergies  Allergen Reactions  . Xarelto [Rivaroxaban] Nausea And Vomiting, Palpitations and Other (See Comments)    Headaches and fatigue, numbness in hands/feet and vomitting  :  Family History  Problem Relation Age of Onset  . Heart disease Mother   . Diabetes Sister   . Heart disease Sister   . Heart disease Brother   :  Social History   Social History  . Marital Status: Married    Spouse Name: Merita Norton  . Number of Children: 3  . Years of Education: N/A   Occupational History  . Disability    Social History Main Topics  . Smoking status: Never Smoker   . Smokeless tobacco: Never Used  . Alcohol Use: No  . Drug Use: No  . Sexual Activity: Not Currently   Other Topics Concern  . Not on file   Social History Narrative   Moved from [place] to [facility] 04/2014.    Lives with her daughter Willie Loy),  Husband, 5 grandchildren and her daughter's partner.   Reports any transportation difficulty   # 2 sons live in [place]   Patient on disability, but  used to do various manual labor jobs in past   Enjoys cooking  :  Pertinent items are noted in HPI.  Exam: Patient Vitals for the past 24 hrs:  BP Temp Temp src Pulse Resp SpO2 Height Weight  04/20/15 0501 (!) 158/99 mmHg 98 F (36.7 C) Oral (!) 113 18 98 % - -  04/19/15 2145 (!) 154/86 mmHg 98.1 F (36.7 C) Oral (!) 111 18 98 % 5\' 3"  (1.6 m) 137 lb (62.143 kg)  04/19/15 2100 161/99 mmHg - - - 17 - - -  04/19/15 2030 (!) 160/103 mmHg - - 118 12 98 % - -  04/19/15 2000 (!) 159/108 mmHg - - (!) 123 13 95 % - -   04/19/15 1955 (!) 154/105 mmHg - - 115 24 92 % - -  04/19/15 1900 (!) 154/105 mmHg - - 118 16 99 % - -  04/19/15 1830 137/100 mmHg - - 113 22 96 % - -  04/19/15 1800 143/98 mmHg - - 115 12 95 % - -  04/19/15 1747 (!) 151/103 mmHg 98.4 F (36.9 C) Oral 119 20 99 % - -  04/19/15 1700 (!) 163/108 mmHg - - (!) 121 13 97 % - -  04/19/15 1551 - - - - - - - 135 lb (61.236 kg)  04/19/15 1527 142/100 mmHg 98.4 F (36.9 C) Oral (!) 137 24 99 % - -    ill-appearing Hispanic female. Head and neck exam shows some temporal muscle wasting. She has no scleral icterus. Oral mucosa is slightly dry. Lungs are with some slight decrease at the bases. Cardiac exam regular rate and rhythm with no murmurs, rubs or bruits. Abdomen is slightly distended distended. She has some high-pitched bowel sounds. She has no guarding. There is no obvious abdominal mass. Extremities show some trace edema in her legs. She has decent range of motion of her joints.    Recent Labs  04/19/15 1615 04/20/15 0521  WBC 5.6 3.9*  HGB 12.3 11.2*  HCT 36.8 35.2*  PLT 312 254    Recent Labs  04/19/15 1615 04/20/15 0521  NA 131* 132*  K 4.6 3.7  CL 96* 98*  CO2 26 25  GLUCOSE 118* 101*  BUN 21* 16  CREATININE 0.60 0.55  CALCIUM 9.1 8.5*    Blood smear review:  None  Pathology: None     Assessment and Plan:  [name] is a 54 year old Hispanic female. She really isn't bad shape. She is obstructing again. This in spite of the fact that she is currently getting treatment.  From my point of view, I'm not sure anything else can be done for her. Surgery is was not patient but I would suspect that she would obstruct again even after surgery.  It is difficult because she has no comprehension of English. As such, I would seriously consider palliative care and hospice for her. I'm sure that once we check her prealbumin, that it will be less than 10.  I will have to defer to Dr. Benay Spice to make that decision.  For now,  I agree with IV fluids. She may need to have a paracentesis done. This can be done after the weekend.  We will check a prealbumin on her.  This is a very sad situation. She is a full code but this would be futile for my point of view as I just don't think that she would ever come off life support.  I appreciate the great care that she  is getting on 4 E.!!!   Pete E.  Hebrews 12:12

## 2015-04-20 NOTE — H&P (Signed)
Have drained out more than 500cc's of golden fluid from NGT since it was placed 4 hours ago.  Fluid started out orange/pink when first placed and is now golden.  Pt reports nausea is improved, but abd pain continues anywhere from 9 down to a 2 after IV dilaudid.  Most frustrating for pt is the additional irritation she now has from the NGT in her nose and throat. Small amount of Ice and spray being used.  Have done much education about purpose of NGT, obstruction, bowel rest, and her option of removing this NGT, or changing nares.  At this time pt prefers to keep NGT where it is.  Have gotten pt oob to [facility] x3 in past 4 hours bc pt feels the need to have a stool.  Unable to pass gas or stool.

## 2015-04-20 NOTE — Progress Notes (Signed)
Pt admitted with chemo pump infusing into R porta cath.  Site and machine is CDI.  IV team notified.  Pt states that it is to run until Thursday.  Bag is intact.  Pt placed on Chemo precautions and have obtained special chemo gown and instructed staff.

## 2015-04-20 NOTE — Consult Note (Signed)
Consultation Note Date: 04/20/2015   [name]  DOB: Oct 16, 1960  MRN: [id number]  Age / Sex: 54 y.o., female   PCP: Boykin Nearing, MD Referring Physician: Theodis Blaze, MD  Reason for Consultation: Establishing goals of care and Pain control  Palliative Care Assessment and Plan Summary of Established Goals of Care and Medical Treatment Preferences   Clinical Assessment/Narrative: 54 y.o. Female, Hispanic, with known metastatic gastric cancer (undergoing chemo with 5-FU and radiation tx), PE, SBO in May (secondary to adhesions), again in August 2016 (secondary to peritoneal tumor, self resolved), admitted with high grade SBO. Surgery has evaluated and no good option for surgical management. Palliative consulted to establish goals of care in light of acute event on her chronic metastatic disease.  I met with the patient, her husband, bedside nurse, and Dr. Tyrell Antonio who accompanied me to facilitate encounter as patient speaks only Spanish.  Our discussion today revolved around the patient's pain and getting her symptoms under better control. She reports that she continues to have pain in her abdomen that is constant. She denies colicky nature to the pain. It is sharp in character. She rates it as high as a 9 out of 10. Currently she is 4 out of 10 after receiving IV Dilaudid. It has continued to worsen. It is located in the lower quadrants bilaterally and does not radiate.   Prior to admission, she reports taking Dilaudid at home 8 mg every 2 hours daily around-the-clock. When asked specifically how many pills it was an unclear answer. They report she does not wake up to take her pills every 2 hours but she often wakes and takes doses in the middle of the night. She does take the medication regularly throughout the day.  She reports she is passing flatus and as noted had a bowel movement today. Her nausea seems much improved and she states it got better when NGT was  removed.  [facility] plan for initiation of Dilaudid PCA, Decadron 4 mg twice a day, and low-dose Reglan 3 times per day. Hopefully this will symptomatically improve her bowel obstruction. - I purposely did not discuss any goals of care with her today. I feel like she needs to be better controlled symptomatically in order to mentally be in a place to continue this conversation. She has been noted by nursing to have specifically said she is uninterested in discussing any care other than aggressive medical treatment. We will continue to follow-up with this conversation when she is feeling better. - Dr. Marin Olp has evaluated her and notes that he would strongly consider hospice. Will await input from Dr. Benay Spice as this we very important to patient and her decision-making. - Sincerely appreciate Dr. Paulina Fusi assistance in facilitating encounter  Contacts/Participants in Discussion: Primary Decision Maker: Patient   HCPOA: None on chart. She does have a husband.   Code Status/Advance Care Planning:  Full  Symptom Management:   Pain: Her pain is currently uncontrolled on her current regimen. She does receive relief from Dilaudid 2 mg every 2 hours but states it does not last the entire time until she is due for next dose. It will take her pain level from 9 out of 10 down to 4 out of 10. We reviewed her home usage with her and she reports taking Dilaudid 8 mg around every 2 hours. This would be approximately equivalent to 1.5 mg IV every 2 hours.  It is unclear exactly how many doses she takes but it seems  to be so anywhere between 6 and 12 doses daily. This would be equivalent to 9.6-19.2 mg of IV Dilaudid as her baseline opioid use prior to this acute worsening of her pain. After reviewing the chart she has had a total of 12.5 mg IV Dilaudid in the last 12 hours. She does not show any signs of sedation and reports her pain still has not been adequately controlled. I think she would be best served by  initiation of a Dilaudid PCA. Based upon a reported home usage as well as usage over the last 12 hours, will plan for initiation of Dilaudid PCA 0.5 mg basal rate, 0.3 mg bolus with a 15 minute lockout. The patient reports she has used PCA in the past after surgery and understands how to do this. Also reviewed with the patient and her husband that no other family members are to utilize the PCA button and the patient herself should be the one to use the button when she is having pain. Will follow-up on her usage with nurse this evening and sure that she is adequately controlled with her pain and not showing any signs of sedation.  Obstruction: As malignant obstruction, she may be improved by initiation of Decadron 4 mg. As she is also now having bowel movements and passing flatus will plan for initiation of Reglan 5 mg 3 times per day.  Palliative Prophylaxis: We'll continue to follow. With her chronic narcotic usage she should should be on a bowel regimen long-term  Additional Recommendations (Limitations, Scope, Preferences):  None at this time Psycho-social/Spiritual:   Support System: Her family  Desire for further Chaplaincy support:no  Prognosis: Unable to determine however with her metastatic disease prognosis is less than 6 months and she should qualify for hospice support if desired  Discharge Planning:  To be determined       Chief Complaint/History of Present Illness:  54 year old female with metastatic gastric cancer admitted with small bowel obstruction  Primary Diagnoses  Present on Admission:  . Dehydration . Gastric cancer . SBO (small bowel obstruction) . PE (pulmonary embolism) . Hyponatremia  Palliative Review of Systems: Patient reports nausea improved. She is still having pain as per history of present illness. She denies constipation. She is passing flatus. I have reviewed the medical record, interviewed the patient and family, and examined the patient. The  following aspects are pertinent.  Past Medical History  Diagnosis Date  . 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 PE (pulmonary embolism) 05/17/2014  . Gastric cancer 06/06/2014  . Constipation 09/04/2014   Social History   Social History  . Marital Status: Married    Spouse Name: Merita Norton  . Number of Children: 3  . Years of Education: N/A   Occupational History  . Disability    Social History Main Topics  . Smoking status: Never Smoker   . Smokeless tobacco: Never Used  . Alcohol Use: No  . Drug Use: No  . Sexual Activity: Not Currently   Other Topics Concern  . None   Social History Narrative   Moved from [place] to [place] 04/2014.    Lives with her daughter Shirrell Solinger),  Husband, 5 grandchildren and her daughter's partner.   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  Heart disease Mother   . Diabetes Sister   . Heart disease Sister   . Heart disease Brother    Scheduled Meds: . dexamethasone  4 mg Intravenous Q12H  . HYDROmorphone PCA 0.3 mg/mL   Intravenous 6 times per day  . metoCLOPramide (REGLAN) injection  5 mg Intravenous 3 times per day  . sodium chloride  3 mL Intravenous Q12H   Continuous Infusions: . sodium chloride 100 mL/hr at 04/20/15 1820   PRN Meds:.acetaminophen **OR** acetaminophen, diphenhydrAMINE **OR** diphenhydrAMINE, hydrALAZINE, HYDROmorphone (DILAUDID) injection, naloxone **AND** sodium chloride, ondansetron **OR** ondansetron (ZOFRAN) IV, phenol Medications Prior to Admission:  Prior to Admission medications   Medication Sig Start Date End Date Taking? Authorizing Provider  Alum & Mag Hydroxide-Simeth (MAGIC MOUTHWASH) SOLN Take 5 mLs by mouth 4 (four) times daily as needed for mouth pain. 11/19/14  Yes Ladell Pier, MD   amLODipine (NORVASC) 5 MG tablet Take 1 tablet (5 mg total) by mouth daily. 03/25/15  Yes Kelvin Cellar, MD  diphenoxylate-atropine (LOMOTIL) 2.5-0.025 MG per tablet Take 2 tablets by mouth 4 (four) times daily as needed for diarrhea or loose stools.   Yes Historical Provider, MD  HYDROmorphone (DILAUDID) 4 MG tablet Take 1-2 tablets (4-8 mg total) by mouth every 4 (four) hours as needed for severe pain. 04/10/15  Yes Ladell Pier, MD  pantoprazole (PROTONIX) 40 MG tablet Take 1 tablet (40 mg total) by mouth 2 (two) times daily. 01/15/15  Yes Janece Canterbury, MD  potassium chloride SA (K-DUR,KLOR-CON) 20 MEQ tablet Take 2 tablets (40 mEq total) by mouth daily. 03/25/15  Yes Kelvin Cellar, MD  prochlorperazine (COMPAZINE) 10 MG tablet Take 1 tablet (10 mg total) by mouth every 6 (six) hours as needed for nausea or vomiting. 09/03/14  Yes Susanne Borders, NP  sertraline (ZOLOFT) 100 MG tablet Take 1 tablet (100 mg total) by mouth daily. 05/17/14  Yes Boykin Nearing, MD  simethicone (MYLICON) 144 MG chewable tablet Chew 125 mg by mouth every 6 (six) hours as needed for flatulence.   Yes Historical Provider, MD  warfarin (COUMADIN) 2 MG tablet Take 2 mg by mouth every other day.    Yes Historical Provider, MD  warfarin (COUMADIN) 4 MG tablet Take 4 mg by mouth every other day.   Yes Historical Provider, MD  ondansetron (ZOFRAN) 4 MG tablet Take 1 tablet (4 mg total) by mouth every 6 (six) hours. Patient not taking: Reported on 04/19/2015 07/10/14   Dorie Rank, MD   Allergies  Allergen Reactions  . Xarelto [Rivaroxaban] Nausea And Vomiting, Palpitations and Other (See Comments)    Headaches and fatigue, numbness in hands/feet and vomitting   CBC:    Component Value Date/Time   WBC 3.9* 04/20/2015 0521   WBC 5.3 04/17/2015 0848   HGB 11.2* 04/20/2015 0521   HGB 12.1 04/17/2015 0848   HCT 35.2* 04/20/2015 0521   HCT 36.6 04/17/2015 0848   PLT 254 04/20/2015 0521   PLT 335 04/17/2015 0848   MCV  90.7 04/20/2015 0521   MCV 88.8 04/17/2015 0848   NEUTROABS 4.4 04/19/2015 1615   NEUTROABS 3.9 04/17/2015 0848   LYMPHSABS 0.5* 04/19/2015 1615   LYMPHSABS 0.6* 04/17/2015 0848   MONOABS 0.6 04/19/2015 1615   MONOABS 0.8 04/17/2015 0848   EOSABS 0.0 04/19/2015 1615   EOSABS 0.0 04/17/2015 0848   BASOSABS 0.0 04/19/2015 1615   BASOSABS 0.0 04/17/2015 0848   Comprehensive Metabolic Panel:    Component Value Date/Time  NA 132* 04/20/2015 0521   NA 132* 04/17/2015 0849   K 3.7 04/20/2015 0521   K 4.1 04/17/2015 0849   CL 98* 04/20/2015 0521   CO2 25 04/20/2015 0521   CO2 24 04/17/2015 0849   BUN 16 04/20/2015 0521   BUN 18.6 04/17/2015 0849   CREATININE 0.55 04/20/2015 0521   CREATININE 0.7 04/17/2015 0849   CREATININE 0.61 05/17/2014 1515   GLUCOSE 101* 04/20/2015 0521   GLUCOSE 139 04/17/2015 0849   CALCIUM 8.5* 04/20/2015 0521   CALCIUM 9.1 04/17/2015 0849   AST 22 04/20/2015 0521   AST 47* 04/10/2015 0913   ALT 17 04/20/2015 0521   ALT 29 04/10/2015 0913   ALKPHOS 324* 04/20/2015 0521   ALKPHOS 588* 04/10/2015 0913   BILITOT 1.3* 04/20/2015 0521   BILITOT 1.03 04/10/2015 0913   PROT 5.9* 04/20/2015 0521   PROT 6.9 04/10/2015 0913   ALBUMIN 2.9* 04/20/2015 0521   ALBUMIN 3.1* 04/10/2015 0913    Physical Exam: Vital Signs: BP 159/99 mmHg  Pulse 113  Temp(Src) 98.4 F (36.9 C) (Oral)  Resp 14  Ht _0  (1.6 m)  Wt 62.143 kg (137 lb)  BMI 24.27 kg/m2  SpO2 97% SpO2: SpO2: 97 % O2 Device: O2 Device: Not Delivered O2 Flow Rate:   Intake/output summary:  Intake/Output Summary (Last 24 hours) at 04/20/15 1947 Last data filed at 04/20/15 1828  Gross per 24 hour  Intake 2561.67 ml  Output   1300 ml  Net 1261.67 ml   LBM: Last BM Date:  (Prior to admission) Baseline Weight: Weight: 61.236 kg (135 lb) Most recent weight: Weight: 62.143 kg (137 lb)  Exam Findings:   General: Lying in bed with eyes closed on arrival. She wakes and is conversant. No signs  of sedation., NAD at time of my exam.  Cardiovascular: Regular rhythm, tachycardic, no rubs, no gallops  Respiratory: Clear to auscultation bilaterally, diminished breath sounds at bases   Abdomen: Soft, tender in epigastric area and distended, no guarding  Extremities: No edema, pulses DP and PT palpable bilaterally         Additional Data Reviewed: Recent Labs     04/19/15  1615  04/20/15  0521  WBC  5.6  3.9*  HGB  12.3  11.2*  PLT  312  254  NA  131*  132*  BUN  21*  16  CREATININE  0.60  0.55     Time In: 1530 Time Out: 1630 Time Total: 60 Greater than 50%  of this time was spent counseling and coordinating care related to the above assessment and plan.  Signed by: Micheline Rough, MD  Micheline Rough, MD  04/20/2015, 7:47 PM  Please contact Palliative Medicine Team phone at [phone] for questions and concerns.

## 2015-04-20 NOTE — Progress Notes (Signed)
ANTICOAGULATION CONSULT NOTE - Initial Consult  Pharmacy Consult for enoxaparin Indication: hx of PE, warfarin bridge  Allergies  Allergen Reactions  . Xarelto [Rivaroxaban] Nausea And Vomiting, Palpitations and Other (See Comments)    Headaches and fatigue, numbness in hands/feet and vomitting    Patient Measurements: Height: 5\' 3"  (160 cm) Weight: 137 lb (62.143 kg) IBW/kg (Calculated) : 52.4 Heparin Dosing Weight:   Vital Signs: Temp: 98.1 F (36.7 C) (09/03 2145) Temp Source: Oral (09/03 2145) BP: 154/86 mmHg (09/03 2145) Pulse Rate: 111 (09/03 2145)  Labs:  Recent Labs  04/17/15 0848 04/17/15 0849 04/17/15 0849 04/17/15 1211 04/19/15 1615 04/19/15 2152  HGB 12.1  --   --   --  12.3  --   HCT 36.6  --   --   --  36.8  --   PLT 335  --   --   --  312  --   LABPROT  --   --   --   --   --  27.4*  INR  --   --  1.60* 1.6  --  2.59*  CREATININE  --  0.7  --   --  0.60  --     Estimated Creatinine Clearance: 65.7 mL/min (by C-G formula based on Cr of 0.6).   Medical History: Past Medical History  Diagnosis Date  . 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 PE (pulmonary embolism) 05/17/2014  . Gastric cancer 06/06/2014  . Constipation 09/04/2014    Medications:  Prescriptions prior to admission  Medication Sig Dispense Refill Last Dose  . Alum & Mag Hydroxide-Simeth (MAGIC MOUTHWASH) SOLN Take 5 mLs by mouth 4 (four) times daily as needed for mouth pain. 300 mL 1 04/19/2015 at Unknown time  . amLODipine (NORVASC) 5 MG tablet Take 1 tablet (5 mg total) by mouth daily. 30 tablet 2 04/19/2015 at Unknown time  . diphenoxylate-atropine (LOMOTIL) 2.5-0.025 MG per tablet Take 2 tablets by mouth 4 (four) times daily as needed for diarrhea or loose stools.   04/19/2015 at Unknown time  . HYDROmorphone (DILAUDID) 4 MG tablet Take 1-2 tablets (4-8 mg total) by mouth every 4 (four) hours as needed for severe pain. 120 tablet 0 04/19/2015 at 1430  . pantoprazole  (PROTONIX) 40 MG tablet Take 1 tablet (40 mg total) by mouth 2 (two) times daily. 60 tablet 0 04/19/2015 at Unknown time  . potassium chloride SA (K-DUR,KLOR-CON) 20 MEQ tablet Take 2 tablets (40 mEq total) by mouth daily. 60 tablet 0 04/19/2015 at Unknown time  . prochlorperazine (COMPAZINE) 10 MG tablet Take 1 tablet (10 mg total) by mouth every 6 (six) hours as needed for nausea or vomiting. 30 tablet 2 04/19/2015 at Unknown time  . sertraline (ZOLOFT) 100 MG tablet Take 1 tablet (100 mg total) by mouth daily. 30 tablet 2 04/19/2015 at Unknown time  . simethicone (MYLICON) 401 MG chewable tablet Chew 125 mg by mouth every 6 (six) hours as needed for flatulence.   PRN  . warfarin (COUMADIN) 2 MG tablet Take 2 mg by mouth every other day.    04/18/2015 at 0700  . warfarin (COUMADIN) 4 MG tablet Take 4 mg by mouth every other day.   04/19/2015 at 0700  . ondansetron (ZOFRAN) 4 MG tablet Take 1 tablet (4 mg total) by mouth every 6 (six) hours. (Patient not taking: Reported on 04/19/2015) 30 tablet 0 Taking    Assessment: Patient on warfarin for hx of  PE.  INR at goal (2-3) on admit.  MD wishes to bridge with enoxaparin while in hospital at this time.  MD okays to wait until INR <2 to begin enoxaparin.  Goal of Therapy:  Anti-Xa level 0.6-1 units/ml 4hrs after LMWH dose given Monitor platelets by anticoagulation protocol: Yes   Plan:  Daily INR Will dose enoxaparin based on weight/renal function when INR <2  Nani Skillern Crowford 04/20/2015,4:03 AM

## 2015-04-20 NOTE — Progress Notes (Signed)
Pt groaning and complaining of pain in abd.  Denies nausea.  Order is for 1 mg of dilaudid q3 hours.  Last given at [place].  Is requesting an additional dose.  I have notified Kathline Magic on call and am awaiting reply.

## 2015-04-20 NOTE — Progress Notes (Signed)
Patient ID: Alicia [name], female   DOB: 20-Sep-1960, 54 y.o.   MRN: [id number]  [facility]  Subjective: Patient with mild pain.  Could not tolerate NG tube and it has been removed.  Passing small flatus.  Objective: Vital signs in last 24 hours: Temp:  [98 F (36.7 C)-98.4 F (36.9 C)] 98 F (36.7 C) (09/04 0501) Pulse Rate:  [111-137] 113 (09/04 0501) Resp:  [12-24] 18 (09/04 0501) BP: (137-163)/(86-108) 158/99 mmHg (09/04 0501) SpO2:  [92 %-99 %] 98 % (09/04 0501) Weight:  [61.236 kg (135 lb)-62.143 kg (137 lb)] 62.143 kg (137 lb) (09/03 2145)    Intake/Output from previous day: 09/03 0701 - 09/04 0700 In: 1415 [P.O.:15; I.V.:950; NG/GT:450] Out: 1300 [Urine:1300] Intake/Output this shift:    Physical Exam: HEENT - sclerae clear, mucous membranes moist Neck - soft Chest - clear bilaterally Cor - RRR Abdomen - moderate distension, active BS present; mild diffuse tenderness  Lab Results:   Recent Labs  04/19/15 1615 04/20/15 0521  WBC 5.6 3.9*  HGB 12.3 11.2*  HCT 36.8 35.2*  PLT 312 254   BMET  Recent Labs  04/19/15 1615 04/20/15 0521  NA 131* 132*  K 4.6 3.7  CL 96* 98*  CO2 26 25  GLUCOSE 118* 101*  BUN 21* 16  CREATININE 0.60 0.55  CALCIUM 9.1 8.5*   PT/INR  Recent Labs  04/19/15 2152 04/20/15 0521  LABPROT 27.4* 28.5*  INR 2.59* 2.73*   Comprehensive Metabolic Panel:    Component Value Date/Time   NA 132* 04/20/2015 0521   NA 131* 04/19/2015 1615   NA 132* 04/17/2015 0849   NA 134* 04/10/2015 0913   K 3.7 04/20/2015 0521   K 4.6 04/19/2015 1615   K 4.1 04/17/2015 0849   K 4.5 04/10/2015 0913   CL 98* 04/20/2015 0521   CL 96* 04/19/2015 1615   CO2 25 04/20/2015 0521   CO2 26 04/19/2015 1615   CO2 24 04/17/2015 0849   CO2 25 04/10/2015 0913   BUN 16 04/20/2015 0521   BUN 21* 04/19/2015 1615   BUN 18.6 04/17/2015 0849   BUN 15.3 04/10/2015 0913   CREATININE 0.55 04/20/2015 0521   CREATININE 0.60 04/19/2015 1615   CREATININE 0.7 04/17/2015 0849   CREATININE 0.7 04/10/2015 0913   CREATININE 0.61 05/17/2014 1515   GLUCOSE 101* 04/20/2015 0521   GLUCOSE 118* 04/19/2015 1615   GLUCOSE 139 04/17/2015 0849   GLUCOSE 104 04/10/2015 0913   CALCIUM 8.5* 04/20/2015 0521   CALCIUM 9.1 04/19/2015 1615   CALCIUM 9.1 04/17/2015 0849   CALCIUM 9.5 04/10/2015 0913   AST 22 04/20/2015 0521   AST 28 04/19/2015 1615   AST 47* 04/10/2015 0913   AST 23 03/27/2015 0931   ALT 17 04/20/2015 0521   ALT 19 04/19/2015 1615   ALT 29 04/10/2015 0913   ALT 13 03/27/2015 0931   ALKPHOS 324* 04/20/2015 0521   ALKPHOS 395* 04/19/2015 1615   ALKPHOS 588* 04/10/2015 0913   ALKPHOS 233* 03/27/2015 0931   BILITOT 1.3* 04/20/2015 0521   BILITOT 1.0 04/19/2015 1615   BILITOT 1.03 04/10/2015 0913   BILITOT 1.67* 03/27/2015 0931   PROT 5.9* 04/20/2015 0521   PROT 6.8 04/19/2015 1615   PROT 6.9 04/10/2015 0913   PROT 6.1* 03/27/2015 0931   ALBUMIN 2.9* 04/20/2015 0521   ALBUMIN 3.4* 04/19/2015 1615   ALBUMIN 3.1* 04/10/2015 0913   ALBUMIN 2.9* 03/27/2015 0931    Studies/Results:  Ct Abdomen Pelvis W Contrast  04/19/2015   CLINICAL DATA:  Abdominal pain, nausea vomiting and diarrhea for 3 days. The patient with stage IV gastric cancer.  EXAM: CT ABDOMEN AND PELVIS WITH CONTRAST  TECHNIQUE: Multidetector CT imaging of the abdomen and pelvis was performed using the standard protocol following bolus administration of intravenous contrast.  CONTRAST:  180mL OMNIPAQUE IOHEXOL 300 MG/ML  SOLN  COMPARISON:  03/20/2015 and prior CTs  FINDINGS: Lower chest:  A new small left pleural effusion is identified.  Hepatobiliary: The liver is unchanged. The gallbladder is mildly distended. There is mild periportal edema.  Pancreas: Unremarkable  Spleen: Unremarkable  Adrenals/Urinary Tract: Moderate right hydroureteronephrosis is unchanged with transition point near surgical clips in the mid right pelvis. The left  kidney is unremarkable. The adrenal glands are unremarkable. Bladder is collapsed.  Stomach/Bowel: Dilated proximal and mid small bowel loops are identified with collapsed distal small bowel loops, compatible with high-grade small bowel obstruction. The transition point appears to lie within the anterior upper pelvis.  An annular constricting gastric mass is again identified and compatible with known gastric cancer. Peritoneal and omental metastatic disease are again noted and not significantly changed. There is no evidence of abscess or pneumoperitoneum.  Vascular/Lymphatic: Prominent abdominal and retroperitoneal lymph nodes are unchanged. There is no evidence of abdominal aortic aneurysm.  Reproductive: Patient is status post hysterectomy.  Other: Increasing moderate ascites within the abdomen noted.  Musculoskeletal: Scattered sclerotic lesions within the visualized bones are unchanged.  IMPRESSION: High-grade small bowel obstruction with transition point appearing to lie within the anterior upper pelvis. No evidence of pneumoperitoneum.  Increasing moderate ascites and new small left pleural effusion.  Gastric mass and metastatic disease again noted.  Unchanged moderate right hydroureteronephrosis with transition point in the mid right pelvis.   Electronically Signed   By: Margarette Canada M.D.   On: 04/19/2015 18:24   US Abdomen Limited  04/18/2015   CLINICAL DATA:  Gastric malignancy, assess for ascites.  EXAM: LIMITED ABDOMEN ULTRASOUND FOR ASCITES  TECHNIQUE: Limited ultrasound survey for ascites was performed in all four abdominal quadrants.  COMPARISON:  Abdominal and pelvic CT scan dated March 20, 2015  FINDINGS: There is a small amount of [place] ascitic fluid in the right upper and lower quadrant and in the left lower quadrant. A trace fluid is present in the left upper quadrant. The volume present is felt to be insufficient for paracentesis.  IMPRESSION: Small volume of ascites throughout the abdomen.    Electronically Signed   By: David  Martinique M.D.   On: 04/18/2015 10:56    Anti-infectives: Anti-infectives    None      Assessment & Plans: SBO -Metastatic gastric cancer Stage IV. S/p bilateral oophorectomy, total abdominal hysterectomy, omentectomy, resection of abdominal wall and pelvic masses, bilateral pelvic lymphadenectomy 10/18/2013 with the pathology confirming moderate to poorly differentiated adenocarcinoma  Dr. Marin Olp note reviewed  Agree there is little option for surgical intervention - will almost certainly do more harm than good in this setting  Consider Palliative Care for comfort, pain control  Earnstine Regal, MD, [facility] Office: [place] 04/20/2015

## 2015-04-20 NOTE — Progress Notes (Signed)
Pt now requesting to have NGT removed.  States she can not tolerate the irritation in her throat.  Tube removed and pt tolerated that well.  Have offered to reinsert using other nare.  Pt refusing at this time.  Will continue to monitor.

## 2015-04-20 NOTE — Progress Notes (Signed)
Patient ID: Alicia [name], female   DOB: Dec 22, 1960, 54 y.o.   MRN: [id number]  [facility] PROGRESS NOTE  Elyse Prevo VZC:[id number] DOB: [birth year] DOA: 04/19/2015 PCP: Minerva Ends, MD   Brief narrative:    54 y.o. Female, Hispanic, with known metastatic gastric cancer (undergoing chemo with 5-FU and radiation tx), PE, SBO in May (secondary to adhesions), again in August 2016 (secondary to peritoneal tumor, self resolved), now presented with abd pain and noted to have high grade SBO. Surgery consulted and [facility] asked to admit for further evaluation.   Assessment/Plan:    Active Problems: SBO - in the setting of metastatic gastric cancer Stage IV. - S/p SBO/TAH, pathology confirming moderate to poorly differentiated adenocarcinoma - unable to tolerate NGT pulled out - appreciate surgery team assistance  - conservative management - palliative care consulted  - continue analgesia as needed   Acute hyponatremia - pre renal  - continue IVF and repeat BMP in AM  PE (pulmonary embolism) - on Coumadin, continue for now  Gastric cancer, metastatic - appreciate oncology team assistance   DVT prophylaxis - pt on Coumadin   Code Status: Full.  Family Communication:  No family at bedside  Disposition Plan: Home when stable.   IV access:  Peripheral IV  Procedures and diagnostic studies:    Ct Abdomen Pelvis W Contrast  04/19/2015   CLINICAL DATA:  Abdominal pain, nausea vomiting and diarrhea for 3 days. The patient with stage IV gastric cancer.  EXAM: CT ABDOMEN AND PELVIS WITH CONTRAST  TECHNIQUE: Multidetector CT imaging of the abdomen and pelvis was performed using the standard protocol following bolus administration of intravenous contrast.  CONTRAST:  152mL OMNIPAQUE IOHEXOL 300 MG/ML  SOLN  COMPARISON:  03/20/2015 and prior CTs  FINDINGS: Lower chest:  A new small left pleural effusion is identified.  Hepatobiliary: The liver is unchanged. The gallbladder is  mildly distended. There is mild periportal edema.  Pancreas: Unremarkable  Spleen: Unremarkable  Adrenals/Urinary Tract: Moderate right hydroureteronephrosis is unchanged with transition point near surgical clips in the mid right pelvis. The left kidney is unremarkable. The adrenal glands are unremarkable. Bladder is collapsed.  Stomach/Bowel: Dilated proximal and mid small bowel loops are identified with collapsed distal small bowel loops, compatible with high-grade small bowel obstruction. The transition point appears to lie within the anterior upper pelvis.  An annular constricting gastric mass is again identified and compatible with known gastric cancer. Peritoneal and omental metastatic disease are again noted and not significantly changed. There is no evidence of abscess or pneumoperitoneum.  Vascular/Lymphatic: Prominent abdominal and retroperitoneal lymph nodes are unchanged. There is no evidence of abdominal aortic aneurysm.  Reproductive: Patient is status post hysterectomy.  Other: Increasing moderate ascites within the abdomen noted.  Musculoskeletal: Scattered sclerotic lesions within the visualized bones are unchanged.  IMPRESSION: High-grade small bowel obstruction with transition point appearing to lie within the anterior upper pelvis. No evidence of pneumoperitoneum.  Increasing moderate ascites and new small left pleural effusion.  Gastric mass and metastatic disease again noted.  Unchanged moderate right hydroureteronephrosis with transition point in the mid right pelvis.   Electronically Signed   By: Margarette Canada M.D.   On: 04/19/2015 18:24   US Abdomen Limited  04/18/2015   CLINICAL DATA:  Gastric malignancy, assess for ascites.  EXAM: LIMITED ABDOMEN ULTRASOUND FOR ASCITES  TECHNIQUE: Limited ultrasound survey for ascites was performed in all four abdominal quadrants.  COMPARISON:  Abdominal and pelvic CT scan dated March 20, 2015  FINDINGS: There is a small amount of [place] ascitic fluid in the  right upper and lower quadrant and in the left lower quadrant. A trace fluid is present in the left upper quadrant. The volume present is felt to be insufficient for paracentesis.  IMPRESSION: Small volume of ascites throughout the abdomen.   Electronically Signed   By: David  Martinique M.D.   On: 04/18/2015 10:56   Dg Duanne Limerick  W/kub  03/24/2015   CLINICAL DATA:  1  EXAM: WATER SOLUBLE UPPER GI SERIES  TECHNIQUE: Single-column upper GI series was performed using water soluble contrast.  CONTRAST:  45mL OMNIPAQUE IOHEXOL 300 MG/ML  SOLN  COMPARISON:  CT scan 03/20/2015  FLUOROSCOPY TIME:  Radiation Exposure Index (as provided by the fluoroscopic device):  If the device does not provide the exposure index:  Fluoroscopy Time (in minutes and seconds):  2 minutes and 0 seconds  Number of Acquired Images:  FINDINGS: The distal esophagus is grossly normal. Significant irregular narrowing in the body region the stomach due to the patient's known gastric carcinoma. Contrast does get through to the antropyloric region and into the duodenum but is quite slow in progression. Delayed emptying from the stomach into the duodenum may be due to a diffuse ileus or gastroparesis. No obvious mechanical obstruction. No leaking oral contrast to suggest perforation.  IMPRESSION: Irregular narrowing of the body region of the stomach consistent with patient's known gastric cancer. No complete obstruction or perforation.  Delayed emptying of the stomach likely due to diffuse ileus/gastroparesis.   Electronically Signed   By: Marijo Sanes M.D.   On: 03/24/2015 11:15    Medical Consultants:  Surgery Oncology   Other Consultants:  None  IAnti-Infectives:   None  Faye Ramsay, MD  [facility] Pager [phone]  If 7PM-7AM, please contact night-coverage [email or web address] Password [email or web address] 04/20/2015, 2:56 PM   LOS: 1 day   HPI/Subjective: No events overnight.   Objective: Filed Vitals:   04/19/15 2145 04/20/15 0501 04/20/15 1318  04/20/15 1437  BP: 154/86 158/99 154/100 162/108  Pulse: 111 113 118 113  Temp: 98.1 F (36.7 C) 98 F (36.7 C) 98.4 F (36.9 C)   TempSrc: Oral Oral Oral   Resp: 18 18 17    Height: 5\' 3"  (1.6 m)     Weight: 62.143 kg (137 lb)     SpO2: 98% 98% 100%     Intake/Output Summary (Last 24 hours) at 04/20/15 1456 Last data filed at 04/20/15 0855  Gross per 24 hour  Intake   1415 ml  Output   1300 ml  Net    115 ml    Exam:   General:  Pt is somnolent, easy to arouse, NAD  Cardiovascular: Regular rhythm, tachycardic, no rubs, no gallops  Respiratory: Clear to auscultation bilaterally, diminished breath sounds at bases   Abdomen: Soft, tender in epigastric area and distended, no guarding  Extremities: No edema, pulses DP and PT palpable bilaterally  Data Reviewed: Basic Metabolic Panel:  Recent Labs Lab 04/17/15 0849 04/19/15 1615 04/20/15 0521  NA 132* 131* 132*  K 4.1 4.6 3.7  CL  --  96* 98*  CO2 24 26 25   GLUCOSE 139 118* 101*  BUN 18.6 21* 16  CREATININE 0.7 0.60 0.55  CALCIUM 9.1 9.1 8.5*  MG  --   --  1.9  PHOS  --   --  3.4   Liver Function Tests:  Recent Labs Lab 04/19/15 1615 04/20/15 0521  AST 28  22  ALT 19 17  ALKPHOS 395* 324*  BILITOT 1.0 1.3*  PROT 6.8 5.9*  ALBUMIN 3.4* 2.9*   CBC:  Recent Labs Lab 04/17/15 0848 04/19/15 1615 04/20/15 0521  WBC 5.3 5.6 3.9*  NEUTROABS 3.9 4.4  --   HGB 12.1 12.3 11.2*  HCT 36.6 36.8 35.2*  MCV 88.8 89.5 90.7  PLT 335 312 254    Recent Results (from the past 240 hour(s))  Culture, blood (routine x 2)     Status: None (Preliminary result)   Collection Time: 04/19/15  4:10 PM  Result Value Ref Range Status   Specimen Description BLOOD LEFT ANTECUBITAL  Final   Special Requests BOTTLES DRAWN AEROBIC AND ANAEROBIC 5CC EACH  Final   Culture   Final    NO GROWTH < 24 HOURS Performed at [facility]    Report Status PENDING  Incomplete  Urine culture     Status: None (Preliminary  result)   Collection Time: 04/19/15  5:15 PM  Result Value Ref Range Status   Specimen Description URINE, CLEAN CATCH  Final   Special Requests NONE  Final   Culture   Final    CULTURE REINCUBATED FOR BETTER GROWTH Performed at [facility]    Report Status PENDING  Incomplete  Culture, blood (routine x 2)     Status: None (Preliminary result)   Collection Time: 04/19/15  9:52 PM  Result Value Ref Range Status   Specimen Description BLOOD RIGHT HAND  Final   Special Requests BOTTLES DRAWN AEROBIC ONLY 11CC  Final   Culture   Final    NO GROWTH < 24 HOURS Performed at [facility]    Report Status PENDING  Incomplete     Scheduled Meds: . sodium chloride  3 mL Intravenous Q12H   Continuous Infusions: . sodium chloride 100 mL/hr at 04/20/15 1437

## 2015-04-21 LAB — COMPREHENSIVE METABOLIC PANEL
ALBUMIN: 3.1 g/dL — AB (ref 3.5–5.0)
ALT: 18 U/L (ref 14–54)
AST: 23 U/L (ref 15–41)
Alkaline Phosphatase: 304 U/L — ABNORMAL HIGH (ref 38–126)
Anion gap: 10 (ref 5–15)
BUN: 21 mg/dL — AB (ref 6–20)
CHLORIDE: 103 mmol/L (ref 101–111)
CO2: 22 mmol/L (ref 22–32)
Calcium: 8.7 mg/dL — ABNORMAL LOW (ref 8.9–10.3)
Creatinine, Ser: 0.61 mg/dL (ref 0.44–1.00)
GFR calc Af Amer: >60 mL/min (ref >60)
Glucose, Bld: 108 mg/dL — ABNORMAL HIGH (ref 65–99)
POTASSIUM: 4 mmol/L (ref 3.5–5.1)
SODIUM: 135 mmol/L (ref 135–145)
Total Bilirubin: 1.1 mg/dL (ref 0.3–1.2)
Total Protein: 6.2 g/dL — ABNORMAL LOW (ref 6.5–8.1)

## 2015-04-21 LAB — PROTIME-INR
INR: 2.89 — AB (ref 0.00–1.49)
PROTHROMBIN TIME: 29.7 s — AB (ref 11.6–15.2)

## 2015-04-21 LAB — URINE CULTURE

## 2015-04-21 LAB — CBC
HCT: 37.9 % (ref 36.0–46.0)
Hemoglobin: 12.3 g/dL (ref 12.0–15.0)
MCH: 29.9 pg (ref 26.0–34.0)
MCHC: 32.5 g/dL (ref 30.0–36.0)
MCV: 92.2 fL (ref 78.0–100.0)
PLATELETS: 320 10*3/uL (ref 150–400)
RBC: 4.11 MIL/uL (ref 3.87–5.11)
RDW: 20.9 % — AB (ref 11.5–15.5)
WBC: 2.9 10*3/uL — AB (ref 4.0–10.5)

## 2015-04-21 LAB — PREALBUMIN: PREALBUMIN: 8.9 mg/dL — AB (ref 18–38)

## 2015-04-21 MED ORDER — SODIUM CHLORIDE 0.9 % IV SOLN
INTRAVENOUS | Status: AC
  Administered 2015-04-21: 19:00:00 via INTRAVENOUS

## 2015-04-21 NOTE — Progress Notes (Signed)
Daily Progress Note   [name]       Date: 04/21/2015 DOB: 03-30-1961  Age: 54 y.o. MRN#: [id number] Attending Physician: Alicia [name] Primary Care Physician: Alicia Ends, [name] Admit Date: 04/19/2015  Reason for Consultation/Follow-up: Establishing goals of care and Pain control  Subjective: 54 y.o. Female, Hispanic, with known metastatic gastric cancer (undergoing chemo with 5-FU and radiation tx), PE, SBO in May (secondary to adhesions), again in August 2016 (secondary to peritoneal tumor, self resolved), admitted with high grade SBO. Surgery has evaluated and no good option for surgical management. Palliative consulted to establish goals of care in light of acute event on her chronic metastatic disease.  Interval Events: The patient's bedside nurse and I met with her to discuss her symptom management. We called her daughter on the phone participate in conversation as well. She reports her pain is much better controlled on the PCA and would like to continue with that at this time. She reports one episode of a little bit of nausea that is now improved.  Length of Stay: 2 days  Current Medications: Scheduled Meds:  . dexamethasone  4 mg Intravenous Q12H  . HYDROmorphone PCA 0.3 mg/mL   Intravenous 6 times per day  . metoCLOPramide (REGLAN) injection  5 mg Intravenous 3 times per day  . sodium chloride  3 mL Intravenous Q12H    Continuous Infusions:    PRN Meds: acetaminophen **OR** acetaminophen, diphenhydrAMINE **OR** diphenhydrAMINE, hydrALAZINE, naloxone **AND** sodium chloride, ondansetron **OR** ondansetron (ZOFRAN) IV, phenol   Vital Signs: BP 135/96 mmHg  Pulse 118  Temp(Src) 98.6 F (37 C) (Oral)  Resp 10  Ht 5' 3"  (1.6 m)  Wt 62.143 kg (137 lb)  BMI 24.27 kg/m2  SpO2 97% SpO2: SpO2: 97 % O2 Device: O2 Device: Not Delivered O2 Flow Rate:    Intake/output summary:  Intake/Output Summary (Last 24 hours) at 04/21/15 1900 Last data  filed at 04/21/15 1655  Gross per 24 hour  Intake 468.33 ml  Output   1000 ml  Net -531.67 ml   LBM:   Baseline Weight: Weight: 61.236 kg (135 lb) Most recent weight: Weight: 62.143 kg (137 lb)  Physical Exam:  General: Lying in bed with eyes closed on arrival. She wakes and is conversant. No signs of sedation., NAD at time of my exam.  Cardiovascular: Regular rhythm, tachycardic, no rubs, no gallops  Respiratory: Clear to auscultation bilaterally, diminished breath sounds at bases   Abdomen: Soft, tender in epigastric area and distended, no guarding Extremities: No edema, pulses DP and PT palpable bilaterally         Additional Data Reviewed: Recent Labs     04/20/15  0521  04/21/15  0752  WBC  3.9*  2.9*  HGB  11.2*  12.3  PLT  254  320  NA  132*  135  BUN  16  21*  CREATININE  0.55  0.61     Problem List:  Patient Active Problem List   Diagnosis Date Noted  . Epigastric pain   . Palliative care encounter   . Hyponatremia 04/19/2015  . Nausea & vomiting 03/20/2015  . Antineoplastic chemotherapy induced anemia 03/20/2015  . Hematuria   . SBO (small bowel obstruction) 01/09/2015  . Dehydration   . Abdominal pain 12/05/2014  . Acute diarrhea 12/05/2014  . Neoplasm related pain 09/04/2014  . 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 Long term current use of anticoagulant therapy 09/04/2014  .  Nausea with vomiting 09/04/2014  . Constipation 09/04/2014  . Financial difficulties 09/04/2014  . Gastric cancer 06/06/2014  . Anticoagulated on Coumadin 05/23/2014  . Depression 05/17/2014  . Pulmonary nodules/lesions, multiple 05/17/2014  . PE (pulmonary embolism) 05/17/2014     Palliative Care Assessment & Plan    Code Status:  Full code  Goals of Care: The patient is full code. We do not discuss goals of care moving forward at this time. She is a patient of Alicia [name] and I think will be important for him to weigh in on her case prior to any  goals of care discussions moving forward. I spoke with her daughter on the phone who reports she will be in the hospital on Wednesday at 10:30 AM. Will plan to set up a family meeting for that time. - I purposely did not discuss any goals of care with her today. She has been noted by nursing to have specifically said she is uninterested in discussing any care other than aggressive medical treatment. We will continue to follow-up with this conversation when she is feeling better and when family is present for a family meeting on Wednesday. - Dr. Marin [name] has evaluated her and notes that he would strongly consider hospice. Will await input from Dr. Benay [name] as this we very important to patient and her decision-making.  Symptom Management:  Pain: Much improved since initiation of PCA. She is averaging approximately 1 mg/h which is a combination of 0.5 mg per hour basal as well as her PCA dosing. I would continue the same for now.  Obstruction: As malignant obstruction, she may be improved by course of Decadron 4 mg. will also continue Reglan 5 mg 3 times per day.  Palliative Prophylaxis:  She is reported to have loose stool. She will likely need to be started on a bowel regimen in the very near future due to the fact that she is now on increased dose of narcotics. Will continue monitor daily.   Prognosis: With her metastatic disease she would qualify for hospice support with a expected prognosis of less than 6 months Discharge Planning: To be determined   Care plan was discussed with bedside nurse, patient, her daughter, and Dr. Doyle [name]  Thank you for allowing the Palliative Medicine Team to assist in the care of this patient.   Time In: 120 Time Out: 150 Total Time 30 Prolonged Time Billed  no     Greater than 50%  of this time was spent counseling and coordinating care related to the above assessment and plan.   Alicia Rough, [name]  04/21/2015, 7:00 PM  Please contact Palliative Medicine Team  phone at [phone] for questions and concerns.

## 2015-04-21 NOTE — Progress Notes (Signed)
ANTICOAGULATION CONSULT NOTE - Follow Up Consult  Pharmacy Consult for lovenox when INR <2 Indication: hx PE  Allergies  Allergen Reactions  . Xarelto [Rivaroxaban] Nausea And Vomiting, Palpitations and Other (See Comments)    Headaches and fatigue, numbness in hands/feet and vomitting    Patient Measurements: Height: 5\' 3"  (160 cm) Weight: 137 lb (62.143 kg) IBW/kg (Calculated) : 52.4  Vital Signs: Temp: 97.9 F (36.6 C) (09/05 0436) Temp Source: Oral (09/05 0436) BP: 146/88 mmHg (09/05 0600) Pulse Rate: 120 (09/05 0600)  Labs:  Recent Labs  04/19/15 1615 04/19/15 2152 04/20/15 0521 04/21/15 0752  HGB 12.3  --  11.2* 12.3  HCT 36.8  --  35.2* 37.9  PLT 312  --  254 320  LABPROT  --  27.4* 28.5* 29.7*  INR  --  2.59* 2.73* 2.89*  CREATININE 0.60  --  0.55 0.61    Estimated Creatinine Clearance: 65.7 mL/min (by C-G formula based on Cr of 0.61).   Assessment: Patient's a 54 y.o F with metastatic gastric cancer currently undergoing chemotherapy/radiation treatment and on coumadin PTA for hx PE.  She presented to [facility] on 9/3 with c/o SBO.  Coumadin was placed on hold d/t SBO and in case surgical intervention is needed.  To bridge with lovenox when INR <2.   - [facility] recom. Palliative care for comfort measures  Home coumadin regimen: 4mg  daily except 2mg  on MWF (per [facility] clinic note on 9/1)   Today, 04/21/2015:  INR trending up to 2.89  Cbc stable  Goal of Therapy:  INR 2-3    Plan:  - continue hold off on starting lovenox for now - f/u AM labs  Alicia [name] P 04/21/2015,9:33 AM

## 2015-04-21 NOTE — Progress Notes (Signed)
Patient ID: Alicia [name], female   DOB: 1961-05-19, 54 y.o.   MRN: [id number]  [facility] PROGRESS NOTE  Alicia [name] SNK:[id number] DOB: 02/08/1961 DOA: 04/19/2015 PCP: Minerva Ends, MD   Brief narrative:    54 y.o. Female, Hispanic, with known metastatic gastric cancer (undergoing chemo with 5-FU and radiation tx), PE, SBO in May (secondary to adhesions), again in August 2016 (secondary to peritoneal tumor, self resolved), now presented with abd pain and noted to have high grade SBO. Surgery consulted and [facility] asked to admit for further evaluation.   Assessment/Plan:    Active Problems: SBO - in the setting of metastatic gastric cancer Stage IV. - S/p SBO/TAH, pathology confirming moderate to poorly differentiated adenocarcinoma - unable to tolerate NGT pulled out - conservative management - placed on Dilaudid drip and pt feeling better this AM  - continue analgesia as needed   Acute hyponatremia - pre renal  - IVF provided and Na is now WNL  PE (pulmonary embolism) - on Coumadin, continue for now  Gastric cancer, metastatic - appreciate oncology team assistance   Severe PCM - in the context of chronic illness - NPO due to the above   DVT prophylaxis - pt on Coumadin   Code Status: Full.  Family Communication:  No family at bedside  Disposition Plan: Home when stable.   IV access:  Peripheral IV  Procedures and diagnostic studies:    Ct Abdomen Pelvis W Contrast  04/19/2015   CLINICAL DATA:  Abdominal pain, nausea vomiting and diarrhea for 3 days. The patient with stage IV gastric cancer.  EXAM: CT ABDOMEN AND PELVIS WITH CONTRAST  TECHNIQUE: Multidetector CT imaging of the abdomen and pelvis was performed using the standard protocol following bolus administration of intravenous contrast.  CONTRAST:  145mL OMNIPAQUE IOHEXOL 300 MG/ML  SOLN  COMPARISON:  03/20/2015 and prior CTs  FINDINGS: Lower chest:  A new small left pleural effusion is identified.   Hepatobiliary: The liver is unchanged. The gallbladder is mildly distended. There is mild periportal edema.  Pancreas: Unremarkable  Spleen: Unremarkable  Adrenals/Urinary Tract: Moderate right hydroureteronephrosis is unchanged with transition point near surgical clips in the mid right pelvis. The left kidney is unremarkable. The adrenal glands are unremarkable. Bladder is collapsed.  Stomach/Bowel: Dilated proximal and mid small bowel loops are identified with collapsed distal small bowel loops, compatible with high-grade small bowel obstruction. The transition point appears to lie within the anterior upper pelvis.  An annular constricting gastric mass is again identified and compatible with known gastric cancer. Peritoneal and omental metastatic disease are again noted and not significantly changed. There is no evidence of abscess or pneumoperitoneum.  Vascular/Lymphatic: Prominent abdominal and retroperitoneal lymph nodes are unchanged. There is no evidence of abdominal aortic aneurysm.  Reproductive: Patient is status post hysterectomy.  Other: Increasing moderate ascites within the abdomen noted.  Musculoskeletal: Scattered sclerotic lesions within the visualized bones are unchanged.  IMPRESSION: High-grade small bowel obstruction with transition point appearing to lie within the anterior upper pelvis. No evidence of pneumoperitoneum.  Increasing moderate ascites and new small left pleural effusion.  Gastric mass and metastatic disease again noted.  Unchanged moderate right hydroureteronephrosis with transition point in the mid right pelvis.   Electronically Signed   By: Margarette Canada M.D.   On: 04/19/2015 18:24   US Abdomen Limited  04/18/2015   CLINICAL DATA:  Gastric malignancy, assess for ascites.  EXAM: LIMITED ABDOMEN ULTRASOUND FOR ASCITES  TECHNIQUE: Limited ultrasound survey for  ascites was performed in all four abdominal quadrants.  COMPARISON:  Abdominal and pelvic CT scan dated March 20, 2015   FINDINGS: There is a small amount of [place] ascitic fluid in the right upper and lower quadrant and in the left lower quadrant. A trace fluid is present in the left upper quadrant. The volume present is felt to be insufficient for paracentesis.  IMPRESSION: Small volume of ascites throughout the abdomen.   Electronically Signed   By: David  Martinique M.D.   On: 04/18/2015 10:56   Dg Duanne Limerick  W/kub  03/24/2015   CLINICAL DATA:  1  EXAM: WATER SOLUBLE UPPER GI SERIES  TECHNIQUE: Single-column upper GI series was performed using water soluble contrast.  CONTRAST:  77mL OMNIPAQUE IOHEXOL 300 MG/ML  SOLN  COMPARISON:  CT scan 03/20/2015  FLUOROSCOPY TIME:  Radiation Exposure Index (as provided by the fluoroscopic device):  If the device does not provide the exposure index:  Fluoroscopy Time (in minutes and seconds):  2 minutes and 0 seconds  Number of Acquired Images:  FINDINGS: The distal esophagus is grossly normal. Significant irregular narrowing in the body region the stomach due to the patient's known gastric carcinoma. Contrast does get through to the antropyloric region and into the duodenum but is quite slow in progression. Delayed emptying from the stomach into the duodenum may be due to a diffuse ileus or gastroparesis. No obvious mechanical obstruction. No leaking oral contrast to suggest perforation.  IMPRESSION: Irregular narrowing of the body region of the stomach consistent with patient's known gastric cancer. No complete obstruction or perforation.  Delayed emptying of the stomach likely due to diffuse ileus/gastroparesis.   Electronically Signed   By: Marijo Sanes M.D.   On: 03/24/2015 11:15    Medical Consultants:  Surgery Oncology  PCT  Other Consultants:  None  IAnti-Infectives:   None  Faye Ramsay, MD  [facility] Pager [phone]  If 7PM-7AM, please contact night-coverage [email or web address] Password [email or web address] 04/21/2015, 8:15 AM   LOS: 2 days   HPI/Subjective: No events overnight.    Objective: Filed Vitals:   04/21/15 0436 04/21/15 0600 04/21/15 0744 04/21/15 0745  BP: 142/92 146/88    Pulse: 117 120    Temp: 97.9 F (36.6 C)     TempSrc: Oral     Resp: 16 16 16 13   Height:      Weight:      SpO2: 97% 97% 96% 96%    Intake/Output Summary (Last 24 hours) at 04/21/15 0815 Last data filed at 04/21/15 0804  Gross per 24 hour  Intake   1615 ml  Output    750 ml  Net    865 ml    Exam:   General:  Pt is somnolent, easy to arouse, NAD  Cardiovascular: Regular rhythm, tachycardic, no rubs, no gallops  Respiratory: Clear to auscultation bilaterally, diminished breath sounds at bases   Abdomen: Soft, tender in epigastric area and distended, no guarding  Extremities: No edema, pulses DP and PT palpable bilaterally  Data Reviewed: Basic Metabolic Panel:  Recent Labs Lab 04/17/15 0849 04/19/15 1615 04/20/15 0521  NA 132* 131* 132*  K 4.1 4.6 3.7  CL  --  96* 98*  CO2 24 26 25   GLUCOSE 139 118* 101*  BUN 18.6 21* 16  CREATININE 0.7 0.60 0.55  CALCIUM 9.1 9.1 8.5*  MG  --   --  1.9  PHOS  --   --  3.4   Liver Function Tests:  Recent Labs  Lab 04/19/15 1615 04/20/15 0521  AST 28 22  ALT 19 17  ALKPHOS 395* 324*  BILITOT 1.0 1.3*  PROT 6.8 5.9*  ALBUMIN 3.4* 2.9*   CBC:  Recent Labs Lab 04/17/15 0848 04/19/15 1615 04/20/15 0521 04/21/15 0752  WBC 5.3 5.6 3.9* 2.9*  NEUTROABS 3.9 4.4  --   --   HGB 12.1 12.3 11.2* 12.3  HCT 36.6 36.8 35.2* 37.9  MCV 88.8 89.5 90.7 92.2  PLT 335 312 254 320    Recent Results (from the past 240 hour(s))  Culture, blood (routine x 2)     Status: None (Preliminary result)   Collection Time: 04/19/15  4:10 PM  Result Value Ref Range Status   Specimen Description BLOOD LEFT ANTECUBITAL  Final   Special Requests BOTTLES DRAWN AEROBIC AND ANAEROBIC 5CC EACH  Final   Culture   Final    NO GROWTH < 24 HOURS Performed at [facility]    Report Status PENDING  Incomplete  Urine culture      Status: None (Preliminary result)   Collection Time: 04/19/15  5:15 PM  Result Value Ref Range Status   Specimen Description URINE, CLEAN CATCH  Final   Special Requests NONE  Final   Culture   Final    CULTURE REINCUBATED FOR BETTER GROWTH Performed at [facility]    Report Status PENDING  Incomplete  Culture, blood (routine x 2)     Status: None (Preliminary result)   Collection Time: 04/19/15  9:52 PM  Result Value Ref Range Status   Specimen Description BLOOD RIGHT HAND  Final   Special Requests BOTTLES DRAWN AEROBIC ONLY 11CC  Final   Culture   Final    NO GROWTH < 24 HOURS Performed at [facility]    Report Status PENDING  Incomplete     Scheduled Meds: . dexamethasone  4 mg Intravenous Q12H  . HYDROmorphone PCA 0.3 mg/mL   Intravenous 6 times per day  . metoCLOPramide (REGLAN) injection  5 mg Intravenous 3 times per day  . sodium chloride  3 mL Intravenous Q12H   Continuous Infusions:

## 2015-04-22 ENCOUNTER — Inpatient Hospital Stay (HOSPITAL_COMMUNITY): Payer: Medicaid Other

## 2015-04-22 ENCOUNTER — Ambulatory Visit
Admission: RE | Admit: 2015-04-22 | Discharge: 2015-04-22 | Disposition: A | Discharge status: Home / Self Care | Payer: Medicaid Other | Source: Ambulatory Visit | Attending: Radiation Oncology | Admitting: Radiation Oncology

## 2015-04-22 ENCOUNTER — Telehealth: Payer: Self-pay | Admitting: *Deleted

## 2015-04-22 DIAGNOSIS — Z7189 Other specified counseling: Secondary | ICD-10-CM

## 2015-04-22 DIAGNOSIS — D630 Anemia in neoplastic disease: Secondary | ICD-10-CM

## 2015-04-22 DIAGNOSIS — Z515 Encounter for palliative care: Secondary | ICD-10-CM

## 2015-04-22 DIAGNOSIS — R112 Nausea with vomiting, unspecified: Secondary | ICD-10-CM

## 2015-04-22 DIAGNOSIS — R180 Malignant ascites: Secondary | ICD-10-CM

## 2015-04-22 LAB — COMPREHENSIVE METABOLIC PANEL
ALBUMIN: 3 g/dL — AB (ref 3.5–5.0)
ALT: 17 U/L (ref 14–54)
AST: 21 U/L (ref 15–41)
Alkaline Phosphatase: 271 U/L — ABNORMAL HIGH (ref 38–126)
Anion gap: 9 (ref 5–15)
BUN: 27 mg/dL — AB (ref 6–20)
CHLORIDE: 106 mmol/L (ref 101–111)
CO2: 24 mmol/L (ref 22–32)
CREATININE: 0.73 mg/dL (ref 0.44–1.00)
Calcium: 9 mg/dL (ref 8.9–10.3)
GFR calc Af Amer: >60 mL/min (ref >60)
GFR calc non Af Amer: >60 mL/min (ref >60)
GLUCOSE: 109 mg/dL — AB (ref 65–99)
POTASSIUM: 3.9 mmol/L (ref 3.5–5.1)
SODIUM: 139 mmol/L (ref 135–145)
Total Bilirubin: 1.4 mg/dL — ABNORMAL HIGH (ref 0.3–1.2)
Total Protein: 6.3 g/dL — ABNORMAL LOW (ref 6.5–8.1)

## 2015-04-22 LAB — CBC
HEMATOCRIT: 35 % — AB (ref 36.0–46.0)
Hemoglobin: 11 g/dL — ABNORMAL LOW (ref 12.0–15.0)
MCH: 29.6 pg (ref 26.0–34.0)
MCHC: 31.4 g/dL (ref 30.0–36.0)
MCV: 94.1 fL (ref 78.0–100.0)
PLATELETS: 316 10*3/uL (ref 150–400)
RBC: 3.72 MIL/uL — ABNORMAL LOW (ref 3.87–5.11)
RDW: 21.3 % — AB (ref 11.5–15.5)
WBC: 3.2 10*3/uL — ABNORMAL LOW (ref 4.0–10.5)

## 2015-04-22 LAB — PROTIME-INR
INR: 3.31 — AB (ref 0.00–1.49)
PROTHROMBIN TIME: 32.9 s — AB (ref 11.6–15.2)

## 2015-04-22 MED ORDER — HYDROMORPHONE HCL 1 MG/ML IJ SOLN
1.0000 mg | INTRAMUSCULAR | Status: DC | PRN
  Administered 2015-04-22 – 2015-04-23 (×3): 2 mg via INTRAVENOUS
  Administered 2015-04-23: 1 mg via INTRAVENOUS
  Administered 2015-04-24: 2 mg via INTRAVENOUS
  Filled 2015-04-22 (×3): qty 2

## 2015-04-22 MED ORDER — SODIUM CHLORIDE 0.9 % IV SOLN
INTRAVENOUS | Status: DC
Start: 2015-04-22 — End: 2015-04-24
  Administered 2015-04-22 – 2015-04-24 (×3): via INTRAVENOUS

## 2015-04-22 NOTE — Progress Notes (Signed)
Subjective: She is vomiting what looks like small amounts, with ongoing nausea, they are occasionally getting her pain under control, but it is transient.  She did sleep some earlier this AM with the current shift.  She had an NG and it was removed at her request.  She speaks no [place], but her daughter was on the phone and translated for [place].  Pt does not want an NG.  I think she understands we cannot control gastric drainage or nausea without NG.  Objective: Vital signs in last 24 hours: Temp:  [98.1 F (36.7 C)-98.6 F (37 C)] 98.1 F (36.7 C) (09/06 0511) Pulse Rate:  [117-122] 117 (09/06 0511) Resp:  [10-18] 12 (09/06 0753) BP: (135-145)/(96-99) 142/98 mmHg (09/06 0511) SpO2:  [95 %-100 %] 100 % (09/06 0753) Last BM Date: 04/20/15 NPO No stool recorded 950 urine Afebrile, VSS BP up some, and she is always tachycardic Labs stable,  INR 3.31  CT abdomen shows diffuse ascites, metastatic tumor, and a transition point consistent with high grade obstruction. Intake/Output from previous day: 09/05 0701 - 09/06 0700 In: 973.3 [I.V.:973.3] Out: 950 [Urine:950] Intake/Output this shift:    General appearance: alert, cooperative, mild distress and crying when we discussed NG GI: distended, not real tender.  Hyperacitive bowel sounds.  Lab Results:   Recent Labs  04/21/15 0752 04/22/15 0517  WBC 2.9* 3.2*  HGB 12.3 11.0*  HCT 37.9 35.0*  PLT 320 316    BMET  Recent Labs  04/21/15 0752 04/22/15 0517  NA 135 139  K 4.0 3.9  CL 103 106  CO2 22 24  GLUCOSE 108* 109*  BUN 21* 27*  CREATININE 0.61 0.73  CALCIUM 8.7* 9.0   PT/INR  Recent Labs  04/21/15 0752 04/22/15 0517  LABPROT 29.7* 32.9*  INR 2.89* 3.31*     Recent Labs Lab 04/19/15 1615 04/20/15 0521 04/21/15 0752 04/22/15 0517  AST 28 22 23 21   ALT 19 17 18 17   ALKPHOS 395* 324* 304* 271*  BILITOT 1.0 1.3* 1.1 1.4*  PROT 6.8 5.9* 6.2* 6.3*  ALBUMIN 3.4* 2.9* 3.1* 3.0*     Lipase      Component Value Date/Time   LIPASE 11* 01/08/2015 1902     Studies/Results: No results found.  Medications: . dexamethasone  4 mg Intravenous Q12H  . HYDROmorphone PCA 0.3 mg/mL   Intravenous 6 times per day  . metoCLOPramide (REGLAN) injection  5 mg Intravenous 3 times per day  . sodium chloride  3 mL Intravenous Q12H    Prior to Admission medications   Medication Sig Start Date End Date Taking? Authorizing Provider  Alum & Mag Hydroxide-Simeth (MAGIC MOUTHWASH) SOLN Take 5 mLs by mouth 4 (four) times daily as needed for mouth pain. 11/19/14  Yes Ladell Pier, MD  amLODipine (NORVASC) 5 MG tablet Take 1 tablet (5 mg total) by mouth daily. 03/25/15  Yes Kelvin Cellar, MD  diphenoxylate-atropine (LOMOTIL) 2.5-0.025 MG per tablet Take 2 tablets by mouth 4 (four) times daily as needed for diarrhea or loose stools.   Yes Historical Provider, MD  HYDROmorphone (DILAUDID) 4 MG tablet Take 1-2 tablets (4-8 mg total) by mouth every 4 (four) hours as needed for severe pain. 04/10/15  Yes Ladell Pier, MD  pantoprazole (PROTONIX) 40 MG tablet Take 1 tablet (40 mg total) by mouth 2 (two) times daily. 01/15/15  Yes Janece Canterbury, MD  potassium chloride SA (K-DUR,KLOR-CON) 20 MEQ tablet Take 2 tablets (40 mEq total) by mouth  daily. 03/25/15  Yes Kelvin Cellar, MD  prochlorperazine (COMPAZINE) 10 MG tablet Take 1 tablet (10 mg total) by mouth every 6 (six) hours as needed for nausea or vomiting. 09/03/14  Yes Susanne Borders, NP  sertraline (ZOLOFT) 100 MG tablet Take 1 tablet (100 mg total) by mouth daily. 05/17/14  Yes Boykin Nearing, MD  simethicone (MYLICON) 290 MG chewable tablet Chew 125 mg by mouth every 6 (six) hours as needed for flatulence.   Yes Historical Provider, MD  warfarin (COUMADIN) 2 MG tablet Take 2 mg by mouth every other day.    Yes Historical Provider, MD  warfarin (COUMADIN) 4 MG tablet Take 4 mg by mouth every other day.   Yes Historical Provider, MD  ondansetron (ZOFRAN) 4  MG tablet Take 1 tablet (4 mg total) by mouth every 6 (six) hours. Patient not taking: Reported on 04/19/2015 07/10/14   Dorie Rank, MD    Assessment/Plan Recurrent SBO, here in May with same issue  [facility] Metastatic gastric cancer Stage IV. S/p bilateral oophorectomy, total abdominal hysterectomy, omentectomy, resection of abdominal wall and pelvic masses, bilateral pelvic lymphadenectomy 10/18/2013 with the pathology confirming moderate to poorly differentiated adenocarcinoma.  Extensive surgery in [place] in past. Chronic pain - currently being seen by Palliative with pain control, decadron, and Reglan initiated.   Currently no  GOAL of CARE discussed Malnutrition Prealbumin 8.9 Hx of PE on coumadin at home. Left pleural effusion INR elevated on coumadin at home, None recorded since admit. Antibiotics:  None DVT: therapeutic on Warfarin  Plan:  I explained to daughter the only way to control nausea and vomiting is with an NG tube.  Pt does not want this.  Palliative is working with her now.    Dr. Harlow Asa is in agreement with Dr. Marin Olp; there is little option for surgical intervention - will almost certainly do more harm than good in this setting.  i will discuss with Dr. Dalbert Batman who is on today.  Defer to Palliative.  LOS: 3 days    Ambera Fedele 04/22/2015

## 2015-04-22 NOTE — Progress Notes (Signed)
Daily Progress Note   [name]       Date: 04/22/2015 DOB: 17-Sep-1960  Age: 54 y.o. MRN#: [id number] Attending Physician: Theodis Blaze, MD Primary Care Physician: Minerva Ends, MD Admit Date: 04/19/2015  Reason for Consultation/Follow-up: Establishing goals of care and Pain control   54 y.o. Female, Hispanic, with known metastatic gastric cancer (s/p chemo with 5-FU and radiation tx), no further treatment being offered by oncology, per patient, PE, SBO in May (secondary to adhesions), again in August 2016 (secondary to peritoneal tumor, self resolved), admitted with high grade SBO. Surgery has evaluated and no good option for surgical management.  Poor prognosis   Subjective:  -visited at bedside to evaluate pain control and overall well-being  -brother (speaks English)Alicia [name]/and his wife at bedside along with her son  -I explained that a meeting is scheduled tomorrow at 39 with an interpreter ( has been scheduled), all family plan to gather including her daughter and spouse.  -I introduced the importance of thinking about advanced directive decisions ( code status) and anticipatory care needs (consider hospice services).  Patient and her family express basic understanding and appreciate the conversation today and are open to tomorrow's scheduled meeting  - today she reports her pain is controlled on the PCA and would like to continue with that at this time. No changes made until discharge plan is discussed, possibly this patient will consider home with hospice and could continue on her PCA  Length of Stay: 3 days  Current Medications: Scheduled Meds:  . dexamethasone  4 mg Intravenous Q12H  . HYDROmorphone PCA 0.3 mg/mL   Intravenous 6 times per day  . metoCLOPramide (REGLAN) injection  5 mg Intravenous 3 times per day  . sodium chloride  3 mL Intravenous Q12H    Continuous Infusions: . sodium chloride 75 mL/hr at 04/22/15 1303    PRN  Meds: acetaminophen **OR** acetaminophen, diphenhydrAMINE **OR** diphenhydrAMINE, hydrALAZINE, HYDROmorphone (DILAUDID) injection, naloxone **AND** sodium chloride, ondansetron **OR** ondansetron (ZOFRAN) IV, phenol   Vital Signs: BP 133/97 mmHg  Pulse 120  Temp(Src) 97.8 F (36.6 C) (Oral)  Resp 16  Ht 5\' 3"  (1.6 m)  Wt 62.143 kg (137 lb)  BMI 24.27 kg/m2  SpO2 99% SpO2: SpO2: 99 % O2 Device: O2 Device: Not Delivered O2 Flow Rate:    Intake/output summary:   Intake/Output Summary (Last 24 hours) at 04/22/15 1657 Last data filed at 04/22/15 1500  Gross per 24 hour  Intake 1119.59 ml  Output    850 ml  Net 269.59 ml   LBM:   Baseline Weight: Weight: 61.236 kg (135 lb) Most recent weight: Weight: 62.143 kg (137 lb)  Physical Exam:   General: Ill appearing, weak, cachectic, NAD   Cardiovascular: Tachycardic  Respiratory: diminished breath sounds at bases, CTA  Abdomen: generalized tenderness on gentle exam    Additional Data Reviewed: Recent Labs     04/21/15  0752  04/22/15  0517  WBC  2.9*  3.2*  HGB  12.3  11.0*  PLT  320  316  NA  135  139  BUN  21*  27*  CREATININE  0.61  0.73     Problem List:  Patient Active Problem List   Diagnosis Date Noted  . Epigastric pain   . Palliative care encounter   . Hyponatremia 04/19/2015  . Nausea & vomiting 03/20/2015  . Antineoplastic chemotherapy induced anemia 03/20/2015  . Hematuria   . SBO (small bowel obstruction) 01/09/2015  .  Dehydration   . Abdominal pain 12/05/2014  . Acute diarrhea 12/05/2014  . Neoplasm related pain 09/04/2014  . 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 Long term current use of anticoagulant therapy 09/04/2014  . Nausea with vomiting 09/04/2014  . Constipation 09/04/2014  . Financial difficulties 09/04/2014  . Gastric cancer 06/06/2014  . Anticoagulated on Coumadin 05/23/2014  . Depression 05/17/2014  . Pulmonary nodules/lesions, multiple 05/17/2014  . PE  (pulmonary embolism) 05/17/2014     Palliative Care Assessment & Plan    Code Status:  Full code  Goals of Care:  Hope is for continued quality of life, within the context of a very limited prognosis.  Patient faces difficult decisions regarding treatment options and anticipatory care needs.   - Symptom Management:  Pain: Much improved since initiation of PCA. She is averaging approximately 1 mg/h which is a combination of 0.5 mg per hour basal as well as her PCA dosing. I would continue the same for now.  Obstruction: Ccontinue Decadron 4 mg IV  bid                          Continue Reglan 5 mg 3 times per day.    Prognosis:   Weeks to months, clearly hospice eligible   Discharge Planning: To be determined   Thank you for allowing the Palliative Medicine Team to assist in the care of this patient.   Time In: 1615 Time Out: 1700 Total Time 45 min Prolonged Time Billed  no     Greater than 50%  of this time was spent counseling and coordinating care related to the above assessment and plan.   Knox Royalty, NP  04/22/2015, 4:57 PM  Please contact Palliative Medicine Team phone at [phone] for questions and concerns.

## 2015-04-22 NOTE — Telephone Encounter (Signed)
RN Susie from 4th floor called, patient is in room 1417 and wants rad tx today, ,she is on PCA pump and IVF's needs to come down via bed, called and spoke with Candace RT therapist and notified to get patent for her 220pm treatment today,will in basket Dr. Lisbeth Renshaw who is off today,Thanked RN for notifying us of patient status as inpatient 12:32 PM

## 2015-04-22 NOTE — Progress Notes (Signed)
ANTICOAGULATION CONSULT NOTE - Follow Up Consult  Pharmacy Consult for lovenox when INR <2 Indication: hx PE  Allergies  Allergen Reactions  . Xarelto [Rivaroxaban] Nausea And Vomiting, Palpitations and Other (See Comments)    Headaches and fatigue, numbness in hands/feet and vomitting    Patient Measurements: Height: 5\' 3"  (160 cm) Weight: 137 lb (62.143 kg) IBW/kg (Calculated) : 52.4  Vital Signs: Temp: 98.1 F (36.7 C) (09/06 0511) Temp Source: Oral (09/06 0511) BP: 142/98 mmHg (09/06 0511) Pulse Rate: 117 (09/06 0511)  Labs:  Recent Labs  04/20/15 0521 04/21/15 0752 04/22/15 0517  HGB 11.2* 12.3 11.0*  HCT 35.2* 37.9 35.0*  PLT 254 320 316  LABPROT 28.5* 29.7* 32.9*  INR 2.73* 2.89* 3.31*  CREATININE 0.55 0.61 0.73    Estimated Creatinine Clearance: 65.7 mL/min (by C-G formula based on Cr of 0.73).   Assessment: Patient's a 54 y.o F with metastatic gastric cancer currently undergoing chemotherapy/radiation treatment and on coumadin PTA for hx PE.  She [facility] with c/o SBO.  Coumadin was placed on hold d/t SBO and in case surgical intervention is needed.  To bridge with lovenox when INR <2.   - CCS recom. Palliative care for comfort measures  Home coumadin regimen: 4mg  daily except 2mg  on MWF (per [facility] note on 9/1)   Today, 04/22/2015:  INR continues rising, now supratherapeutic 3.31  CBC stable  No bleeding issues per RN  Remains nauseous, not eating per chart records  Goal of Therapy:  INR 2-3    Plan:  - continue hold off on starting lovenox until INR < 2 - follow CBC, INR daily  Ralene Bathe, PharmD, BCPS 04/22/2015, 8:09 AM  Pager: [phone]

## 2015-04-22 NOTE — Progress Notes (Signed)
Alicia [name]   DOB:25-Sep-1960   NF#:[id number]   QIO#:[id number]  Subjective: Events since 9/4 noted.She is very tearful this morning.  Pain is better controlled with PCA. Denies any respiratory or cardiac complaints. Nausea and vomiting present, requiring frequent antiemetics. She removed the NGT, does not want the device to help control gastric drainage. Last bowel movement on 9/4. No bleeding issues reported   Scheduled Meds: . dexamethasone  4 mg Intravenous Q12H  . HYDROmorphone PCA 0.3 mg/mL   Intravenous 6 times per day  . metoCLOPramide (REGLAN) injection  5 mg Intravenous 3 times per day  . sodium chloride  3 mL Intravenous Q12H   Continuous Infusions:  PRN Meds:.acetaminophen **OR** acetaminophen, diphenhydrAMINE **OR** diphenhydrAMINE, hydrALAZINE, HYDROmorphone (DILAUDID) injection, naloxone **AND** sodium chloride, ondansetron **OR** ondansetron (ZOFRAN) IV, phenol  Objective:  Filed Vitals:   04/22/15 0753  BP:   Pulse:   Temp:   Resp: 12    Body mass index is 24.27 kg/(m^2).  Intake/Output Summary (Last 24 hours) at 04/22/15 1003 Last data filed at 04/22/15 0513  Gross per 24 hour  Intake 973.34 ml  Output    950 ml  Net  23.34 ml               General: tearful, anxious this morning  Sclerae unicteric  Oropharynx clear  No peripheral adenopathy  Lungs decreased breath sounds at the bases, left greater than right, no rales or rhonchi  Heart regular rate and rhythm  Abdomen soft, mildly tender at the epigastric area, ascites noted, positive bowel sounds  MSK no focal spinal tenderness, no peripheral edema  Neuro nonfocal    CBG (last 3)  No results for input(s): GLUCAP in the last 72 hours.    Labs:   Recent Labs Lab 04/17/15 0848 04/19/15 1615 04/20/15 0521 04/21/15 0752 04/22/15 0517  WBC 5.3 5.6 3.9* 2.9* 3.2*  HGB 12.1 12.3 11.2* 12.3 11.0*  HCT 36.6 36.8 35.2* 37.9 35.0*  PLT 335 312 254 320 316  MCV 88.8 89.5 90.7 92.2 94.1  MCH 29.4  29.9 28.9 29.9 29.6  MCHC 33.1 33.4 31.8 32.5 31.4  RDW 19.9* 19.9* 20.1* 20.9* 21.3*  LYMPHSABS 0.6* 0.5*  --   --   --   MONOABS 0.8 0.6  --   --   --   EOSABS 0.0 0.0  --   --   --   BASOSABS 0.0 0.0  --   --   --      Chemistries:    Recent Labs Lab 04/17/15 0849 04/19/15 1615 04/20/15 0521 04/21/15 0752 04/22/15 0517  NA 132* 131* 132* 135 139  K 4.1 4.6 3.7 4.0 3.9  CL  --  96* 98* 103 106  CO2 24 26 25 22 24   GLUCOSE 139 118* 101* 108* 109*  BUN 18.6 21* 16 21* 27*  CREATININE 0.7 0.60 0.55 0.61 0.73  CALCIUM 9.1 9.1 8.5* 8.7* 9.0  MG  --   --  1.9  --   --   AST  --  28 22 23 21   ALT  --  19 17 18 17   ALKPHOS  --  395* 324* 304* 271*  BILITOT  --  1.0 1.3* 1.1 1.4*     Microbiology Cultures negative to date Studies:  No results found.   Assessment / Plan:   Metastatic Gastric Cancer undergoing radiation therapy with infusional 5-FU. Under Alicia [name] care.  She has received radiation as well. No further treatments are  planned   Patient appears to be in denial, stating that she is going to continue to receive treatments, hoping to cure her disease.  Explained that further treatments are not advised due to prognosis. Awaiting Hospice evaluation. Meeting with Hospice, patient and family to take place on 9/7. Alicia [name] is out of office. Alicia [name] is to follow on immediate oncological issues.  Would recommend code status reevaluation, as is on Full Code at this time.   Small Bowel Obstruction Nausea and vomiting Due to malignancy Not a surgical candidate due to the extent of the disease On conservative management with antiemetics.  Patient refuses NGT. Explained importance of this,that could help her symptom management but patient continues to decline. Awaiting Hospice evaluation. Meeting with patient and family to take place on 9/7.   Ascites due to malignancy Consider therapeutic paracentesis as needed  DVT prophylaxis History of  PE Coumadin is on hold due to SBO in case emergent surgery were needed. Lovenox to be initiated when INR at less than 2   Anemia in neoplastic disease No transfusion is indicated at this time Monitor counts closely   Other medical issues as per admitting team     Alicia [name] 04/22/2015  10:03 AM [facility] [place]  ADDENDUM:  I agree with the above. Unfortunately, there is very little that we can do to try to help her area and I really don't see that she is going to be any shape to take any chemotherapy. She was actually taking radiation and chemotherapy together and yet she still developed a bowel obstruction.   I understand the reluctance that she has with respect to not pursue any further therapy. She is not that old. She just has a very bad disease .  Unfortunately, Alicia [name] will be out for 2 weeks. As such, we will have to make some decisions as to how to help her.  I know that octreotide has been used to try to help decrease intestinal secretions. Sometimes this can help with bowels obstruction.  I do not see any recent abdominal x-ray to see how the obstruction looks. Clinically, I'm sure that she is still obstructed.  The fact that she does not speak English might be a barrier for effective communication. Thankfully, our Alicia [name] is fluent in [place] and I think she will be able to help out and allow [place] to talk to the patient and let her know what is wrong.  I appreciate everybody's help. This is just a very difficult situation and, unfortunately, her primary oncologist will be gone for 2 weeks.  Her prealbumin is already low. This is a ominous prognostic factor.  I don't see that feeding her with TNA will have an impact on her survival. However, this isn't always could be considered.  Alicia [name]

## 2015-04-22 NOTE — Care Management Note (Signed)
Case Management Note  Patient Details  Name: Alicia [name] MRN: [id number] Date of Birth: 01-May-1961  Subjective/Objective: 54 y/o f admitted w/SBO.Readmit-SBO.Spanish Speaking. From home.Onc,sx-following.Noted-declines NGT.                  Action/Plan:d/c plan home.   Expected Discharge Date:                Expected Discharge Plan:  Home/Self Care  In-House Referral:  Interpreting Services  Discharge planning Services  CM Consult  Post Acute Care Choice:    Choice offered to:     DME Arranged:    DME Agency:     HH Arranged:    HH Agency:     Status of Service:  In process, will continue to follow  Medicare Important Message Given:    Date Medicare IM Given:    Medicare IM give by:    Date Additional Medicare IM Given:    Additional Medicare Important Message give by:     If discussed at [place] of Stay Meetings, dates discussed:    Additional Comments:  Dessa Phi, RN 04/22/2015, 12:32 PM

## 2015-04-22 NOTE — Progress Notes (Signed)
Patient ID: Alicia [name], female   DOB: 11-29-1960, 54 y.o.   MRN: [id number]  [facility] PROGRESS NOTE  Alicia [name] JYN:[id number] DOB: Sep 10, 1960 DOA: 04/19/2015 PCP: Minerva Ends, MD   Brief narrative:    54 y.o. Female, Hispanic, with known metastatic gastric cancer (undergoing chemo with 5-FU and radiation tx), PE, SBO in May (secondary to adhesions), again in August 2016 (secondary to peritoneal tumor, self resolved), now presented with abd pain and noted to have high grade SBO. Surgery consulted and [facility] asked to admit for further evaluation.   Assessment/Plan:    Active Problems: SBO, high grade  - in the setting of metastatic gastric cancer Stage IV. - S/p SBO/TAH, pathology confirming moderate to poorly differentiated adenocarcinoma - unable to tolerate NGT pulled out - placed on Dilaudid drip but due to persistent pain, requiring additional Dilaudid boluses  - palliative care meeting scheduled for 9/7 at 10:30 am - appreciate PCT and surgery team assistance   Acute hyponatremia - pre renal  - IVF provided and Na is now WNL  PE (pulmonary embolism) - on Coumadin at home - holding for now a INR is 3.31 this AM   Moderate right hydronephrosis - in the setting of metastatic gastric cancer  - unchanged based on studies, no indication for intervention at this time (per urologist recommendations)   Gastric cancer, metastatic - appreciate oncology team assistance  - recommendation is to focus on comfort with referral for hospice   Leukopenia - remains stable in the past 24 hours  Anemia of chronic disease, malignancy - no indication for transfusion at this time   Severe PCM - in the context of chronic illness - NPO due to the above   Acute functional quadriplegia - in the setting of chronic illness and now SBO - allow to get OOB if pt able to tolerate   DVT prophylaxis - pt on Coumadin but INR remain supratherapeutic   Code Status: Full.   Family Communication:  Pt at bedside Disposition Plan: to be determined   IV access:  Peripheral IV  Procedures and diagnostic studies:    Ct Abdomen Pelvis W Contrast 04/19/2015  High-grade small bowel obstruction with transition point appearing to lie within the anterior upper pelvis. No evidence of pneumoperitoneum.  Increasing moderate ascites and new small left pleural effusion.  Gastric mass and metastatic disease again noted.  Unchanged moderate right hydroureteronephrosis with transition point in the mid right pelvis.    US Abdomen Limited 04/18/2015  Small volume of ascites throughout the abdomen.     Dg Ugi  W/kub 03/24/2015   Irregular narrowing of the body region of the stomach consistent with patient's known gastric cancer. No complete obstruction or perforation.  Delayed emptying of the stomach likely due to diffuse ileus/gastroparesis.    Medical Consultants:  Surgery Oncology  PCT  Other Consultants:  None  IAnti-Infectives:   None  Faye Ramsay, MD  [facility] Pager [phone]  If 7PM-7AM, please contact night-coverage [email or web address] Password [email or web address] 04/22/2015, 12:29 PM   LOS: 3 days   HPI/Subjective: No events overnight. Still in pain 10/10 on dilaudid drip.   Objective: Filed Vitals:   04/22/15 0511 04/22/15 0753 04/22/15 1000 04/22/15 1208  BP: 142/98  129/99   Pulse: 117  118   Temp: 98.1 F (36.7 C)  97.4 F (36.3 C)   TempSrc: Oral  Oral   Resp: 17 12 15 10   Height:      Weight:  SpO2: 96% 100% 98% 95%    Intake/Output Summary (Last 24 hours) at 04/22/15 1229 Last data filed at 04/22/15 1100  Gross per 24 hour  Intake 973.34 ml  Output   1000 ml  Net -26.66 ml    Exam:   General:  Pt is somnolent, easy to arouse, in mild distress due to abd pain   Cardiovascular: Regular rhythm, tachycardic, no rubs, no gallops  Respiratory:  diminished breath sounds at bases   Abdomen: tender in epigastric area and distended, no  guarding  Extremities: pulses DP and PT palpable bilaterally  Data Reviewed: Basic Metabolic Panel:  Recent Labs Lab 04/17/15 0849 04/19/15 1615 04/20/15 0521 04/21/15 0752 04/22/15 0517  NA 132* 131* 132* 135 139  K 4.1 4.6 3.7 4.0 3.9  CL  --  96* 98* 103 106  CO2 24 26 25 22 24   GLUCOSE 139 118* 101* 108* 109*  BUN 18.6 21* 16 21* 27*  CREATININE 0.7 0.60 0.55 0.61 0.73  CALCIUM 9.1 9.1 8.5* 8.7* 9.0  MG  --   --  1.9  --   --   PHOS  --   --  3.4  --   --    Liver Function Tests:  Recent Labs Lab 04/19/15 1615 04/20/15 0521 04/21/15 0752 04/22/15 0517  AST 28 22 23 21   ALT 19 17 18 17   ALKPHOS 395* 324* 304* 271*  BILITOT 1.0 1.3* 1.1 1.4*  PROT 6.8 5.9* 6.2* 6.3*  ALBUMIN 3.4* 2.9* 3.1* 3.0*   CBC:  Recent Labs Lab 04/17/15 0848 04/19/15 1615 04/20/15 0521 04/21/15 0752 04/22/15 0517  WBC 5.3 5.6 3.9* 2.9* 3.2*  NEUTROABS 3.9 4.4  --   --   --   HGB 12.1 12.3 11.2* 12.3 11.0*  HCT 36.6 36.8 35.2* 37.9 35.0*  MCV 88.8 89.5 90.7 92.2 94.1  PLT 335 312 254 320 316    Recent Results (from the past 240 hour(s))  Culture, blood (routine x 2)     Status: None (Preliminary result)   Collection Time: 04/19/15  4:10 PM  Result Value Ref Range Status   Specimen Description BLOOD LEFT ANTECUBITAL  Final   Special Requests BOTTLES DRAWN AEROBIC AND ANAEROBIC 5CC EACH  Final   Culture   Final    NO GROWTH 2 DAYS Performed at [facility]    Report Status PENDING  Incomplete  Urine culture     Status: None   Collection Time: 04/19/15  5:15 PM  Result Value Ref Range Status   Specimen Description URINE, CLEAN CATCH  Final   Special Requests NONE  Final   Culture   Final    MULTIPLE SPECIES PRESENT, SUGGEST RECOLLECTION Performed at [facility]    Report Status 04/21/2015 FINAL  Final  Culture, blood (routine x 2)     Status: None (Preliminary result)   Collection Time: 04/19/15  9:52 PM  Result Value Ref Range Status   Specimen  Description BLOOD RIGHT HAND  Final   Special Requests BOTTLES DRAWN AEROBIC ONLY 11CC  Final   Culture   Final    NO GROWTH 2 DAYS Performed at [facility]    Report Status PENDING  Incomplete     Scheduled Meds: . dexamethasone  4 mg Intravenous Q12H  . HYDROmorphone PCA 0.3 mg/mL   Intravenous 6 times per day  . metoCLOPramide (REGLAN) injection  5 mg Intravenous 3 times per day  . sodium chloride  3 mL  Intravenous Q12H   Continuous Infusions:

## 2015-04-23 ENCOUNTER — Ambulatory Visit
Admission: RE | Admit: 2015-04-23 | Discharge: 2015-04-23 | Disposition: A | Discharge status: Home / Self Care | Payer: Medicaid Other | Source: Ambulatory Visit | Attending: Radiation Oncology | Admitting: Radiation Oncology

## 2015-04-23 DIAGNOSIS — R1110 Vomiting, unspecified: Secondary | ICD-10-CM

## 2015-04-23 DIAGNOSIS — E860 Dehydration: Secondary | ICD-10-CM

## 2015-04-23 DIAGNOSIS — Z510 Encounter for antineoplastic radiation therapy: Secondary | ICD-10-CM | POA: Diagnosis not present

## 2015-04-23 DIAGNOSIS — K5669 Other intestinal obstruction: Secondary | ICD-10-CM

## 2015-04-23 LAB — PROTIME-INR
INR: 3.09 — AB (ref 0.00–1.49)
Prothrombin Time: 31.3 seconds — ABNORMAL HIGH (ref 11.6–15.2)

## 2015-04-23 LAB — CBC
HEMATOCRIT: 32.1 % — AB (ref 36.0–46.0)
HEMOGLOBIN: 10.1 g/dL — AB (ref 12.0–15.0)
MCH: 29.7 pg (ref 26.0–34.0)
MCHC: 31.5 g/dL (ref 30.0–36.0)
MCV: 94.4 fL (ref 78.0–100.0)
Platelets: 249 10*3/uL (ref 150–400)
RBC: 3.4 MIL/uL — AB (ref 3.87–5.11)
RDW: 21.7 % — ABNORMAL HIGH (ref 11.5–15.5)
WBC: 2.7 10*3/uL — AB (ref 4.0–10.5)

## 2015-04-23 LAB — BASIC METABOLIC PANEL
ANION GAP: 10 (ref 5–15)
BUN: 27 mg/dL — ABNORMAL HIGH (ref 6–20)
CHLORIDE: 108 mmol/L (ref 101–111)
CO2: 23 mmol/L (ref 22–32)
Calcium: 9 mg/dL (ref 8.9–10.3)
Creatinine, Ser: 0.63 mg/dL (ref 0.44–1.00)
GFR calc non Af Amer: >60 mL/min (ref >60)
Glucose, Bld: 102 mg/dL — ABNORMAL HIGH (ref 65–99)
Potassium: 3.9 mmol/L (ref 3.5–5.1)
Sodium: 141 mmol/L (ref 135–145)

## 2015-04-23 MED ORDER — HYDROMORPHONE 0.3 MG/ML IV SOLN
INTRAVENOUS | Status: DC
  Administered 2015-04-23: 16:00:00 via INTRAVENOUS
  Administered 2015-04-23: 4.13 mg via INTRAVENOUS
  Administered 2015-04-23: 12:00:00 via INTRAVENOUS
  Administered 2015-04-24: 9.48 mg via INTRAVENOUS
  Administered 2015-04-24: 8.48 mg via INTRAVENOUS
  Filled 2015-04-23 (×3): qty 25

## 2015-04-23 NOTE — Progress Notes (Addendum)
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 Patient seen by surgery and given poor oral prognosis palliative care team consulted with goal for comfort and plan on home hospice.  Assessment/Plan: High-grade small bowel obstruction Secondary to metastatic gastric cancer. High tumor burden with poor overall prognosis . Not a surgical candidate. On Dilaudid drip with interval boluses. Palliative care team met with family this morning with goal on full comfort and recommended home versus residential hospice. Family interested in home hospice. Continue Decadron 4 mg every 12 hours. Scheduled Reglan. -Supportive care with gentle IV hydration and O2 via nasal cannula.  History of pulmonary embolism Patient on Coumadin at home. Patient currently nothing by mouth. Plan on full comfort.  Metastatic gastric cancer Guarded prognosis. 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 No intervention planned.  Dehydration with hyponatremia Continue IV fluids  Functional cardioplegia  Severe protein calorie malnutrition  Diet : nothing by mouth  Code Status: DO NOT RESUSCITATE Family Communication: Niece at bedside  Disposition Plan: Home with hospice in the next 24-48 hours   Consultants:  Surgery  Palliative care  Procedures:  None  Antibiotics:  None  HPI/Subjective: Patient seen and examined. Niece at bedside who interpreted. Patient reports abdominal pain to be stable on Dilaudid PCA  Objective: Filed Vitals:   04/23/15 1600  BP:   Pulse:   Temp:   Resp:  18    Intake/Output Summary (Last 24 hours) at 04/23/15 1611 Last data filed at 04/23/15 0700  Gross per 24 hour  Intake   1200 ml  Output    300 ml  Net    900 ml   Filed Weights   04/19/15 1551 04/19/15 2145  Weight: 61.236 kg (135 lb) 62.143 kg (137 lb)    Exam:   General:  Middle aged female lying in bed not in distress, appears fatigued  HEENT: Pallor present, dry oral mucosa  Chest: Right-sided Port-A-Cath, clear to auscultation bilaterally  CVS: S1 and S2 tachycardic, no murmurs rub or gallop  GI: Mild distention, diffuse tenderness, absent bowel sounds  Musculoskeletal: Warm, no edema  CNS: Alert and oriented   Data Reviewed: Basic Metabolic Panel:  Recent Labs Lab 04/19/15 1615 04/20/15 0521 04/21/15 0752 04/22/15 0517 04/23/15 0545  NA 131* 132* 135 139 141  K 4.6 3.7 4.0 3.9 3.9  CL 96* 98* 103 106 108  CO2 _0 GLUCOSE 118* 101* 108* 109* 102*  BUN 21* 16 21* 27* 27*  CREATININE 0.60 0.55 0.61 0.73 0.63  CALCIUM 9.1 8.5* 8.7* 9.0 9.0  MG  --  1.9  --   --   --   PHOS  --  3.4  --   --   --    Liver Function Tests:  Recent Labs Lab 04/19/15 1615 04/20/15 0521 04/21/15 0752 04/22/15 0517  AST _1 ALT _2 ALKPHOS 395* 324* 304* 271*  BILITOT 1.0 1.3* 1.1 1.4*  PROT 6.8 5.9* 6.2* 6.3*  ALBUMIN 3.4* 2.9* 3.1* 3.0*   No results for input(s): LIPASE, AMYLASE in the last 168 hours. No results for input(s): AMMONIA in the  last 168 hours. CBC:  Recent Labs Lab 04/17/15 0848 04/19/15 1615 04/20/15 0521 04/21/15 0752 04/22/15 0517 04/23/15 0545  WBC 5.3 5.6 3.9* 2.9* 3.2* 2.7*  NEUTROABS 3.9 4.4  --   --   --   --   HGB 12.1 12.3 11.2* 12.3 11.0* 10.1*  HCT 36.6 36.8 35.2* 37.9 35.0* 32.1*  MCV 88.8 89.5 90.7 92.2 94.1 94.4  PLT 335 312 254 320 316 249   Cardiac Enzymes: No results for input(s): CKTOTAL, CKMB, CKMBINDEX, TROPONINI in the last 168 hours. BNP (last 3 results) No results for input(s):  BNP in the last 8760 hours.  ProBNP (last 3 results) No results for input(s): PROBNP in the last 8760 hours.  CBG: No results for input(s): GLUCAP in the last 168 hours.  Recent Results (from the past 240 hour(s))  Culture, blood (routine x 2)     Status: None (Preliminary result)   Collection Time: 04/19/15  4:10 PM  Result Value Ref Range Status   Specimen Description BLOOD LEFT ANTECUBITAL  Final   Special Requests BOTTLES DRAWN AEROBIC AND ANAEROBIC 5CC EACH  Final   Culture   Final    NO GROWTH 4 DAYS Performed at [facility]    Report Status PENDING  Incomplete  Urine culture     Status: None   Collection Time: 04/19/15  5:15 PM  Result Value Ref Range Status   Specimen Description URINE, CLEAN CATCH  Final   Special Requests NONE  Final   Culture   Final    MULTIPLE SPECIES PRESENT, SUGGEST RECOLLECTION Performed at [facility]    Report Status 04/21/2015 FINAL  Final  Culture, blood (routine x 2)     Status: None (Preliminary result)   Collection Time: 04/19/15  9:52 PM  Result Value Ref Range Status   Specimen Description BLOOD RIGHT HAND  Final   Special Requests BOTTLES DRAWN AEROBIC ONLY [place]  Final   Culture   Final    NO GROWTH 4 DAYS Performed at [facility]    Report Status PENDING  Incomplete     Studies: Dg Abd 2 Views  04/22/2015   CLINICAL DATA:  Metastatic gastric cancer, undergoing chemo or radiation therapy. Small bowel obstruction.  EXAM: ABDOMEN - 2 VIEW  COMPARISON:  CT abdomen/pelvis from 04/19/2015.  FINDINGS: There persistent moderately to markedly dilated small bowel loops throughout the abdomen up to 5.6 cm with associated air-fluid levels, in keeping with persistent distal small bowel obstruction, which appears slightly worsened compared to the recent CT study. No evidence of pneumatosis or pneumoperitoneum. Surgical clips are noted in the bilateral pelvis. Stable nonspecific sclerotic lesion in the supra-acetabular  right iliac bone.  IMPRESSION: 1. Persistent distal small bowel obstruction with slight interval worsening. No evidence of pneumoperitoneum. 2. Stable nonspecific supra-acetabular right iliac bone sclerotic lesion, metastasis not excluded.   Electronically Signed   By: Ilona Sorrel M.D.   On: 04/22/2015 15:09    Scheduled Meds: . dexamethasone  4 mg Intravenous Q12H  . HYDROmorphone PCA 0.3 mg/mL   Intravenous 6 times per day  . metoCLOPramide (REGLAN) injection  5 mg Intravenous 3 times per day  . sodium chloride  3 mL Intravenous Q12H   Continuous Infusions: . sodium chloride 75 mL/hr at 04/23/15 0417       Time spent: 25 minutes    DHUNGEL, Laureles  [facility] Pager [phone]. If 7PM-7AM, please contact night-coverage at [email or web address], password [email or web address] 04/23/2015, 4:11 PM  LOS: 4 days

## 2015-04-23 NOTE — Progress Notes (Signed)
Daily Progress Note   [name]       Date: 04/23/2015 DOB: [birth year]  Age: 54 y.o. MRN#: [id number] Attending Physician: Louellen Molder, MD Primary Care Physician: Minerva Ends, MD Admit Date: 04/19/2015  Reason for Consultation/Follow-up: Establishing goals of care and Pain control   54 y.o. Female, Hispanic, with known metastatic gastric cancer (s/p chemo with 5-FU and radiation tx), no further treatment being offered by oncology, per patient, PE, SBO in May (secondary to adhesions), again in August 2016 (secondary to peritoneal tumor, self resolved), admitted with high grade SBO. Surgery has evaluated and no good option for surgical management.  Poor prognosis  FAMILY MEETING: Met with interpretor Almyra Free, patient, her daughter norma, son Garlon Hatchet, son Luiz Ochoa, husband and son in law in the patient's room.  Dilaudid PCA settings noted, the patient continues to complain about 9/10 pain in upper abdomen. She is using bolus dosages regularly. The patient is also nauseous. She is not eating. She is only able to tolerate few ice chips.   Discussed the patient's high grade obstruction as well as her gastric cancer. Discussed pain control, her estimated prognosis. Patient recalls meeting Dr Benay Spice earlier this month, where hospice and code status discussions were undertaken.   PLAN: DNR DNI Hospice consult: evaluate for inpatient hospice versus home with hospice.  Patient is essentially NPO due to her high grade obstruction. Discussed that prognosis could be as short as 1-2 weeks Patient's family requesting a letter from me stating her illness, they're trying to get family members to come from [place] to visit the patient. Will prepare letter soon.  Adjust Dilaudid PCA Continue scheduled Reglan  Extensive discussions held, all questions answered. Patient and her family members tearful but accepting of the patient's current condition. Discussed with bedside  RN   Length of Stay: 4 days  Current Medications: Scheduled Meds:  . dexamethasone  4 mg Intravenous Q12H  . HYDROmorphone PCA 0.3 mg/mL   Intravenous 6 times per day  . metoCLOPramide (REGLAN) injection  5 mg Intravenous 3 times per day  . sodium chloride  3 mL Intravenous Q12H    Continuous Infusions: . sodium chloride 75 mL/hr at 04/23/15 0417    PRN Meds: acetaminophen **OR** acetaminophen, diphenhydrAMINE **OR** diphenhydrAMINE, hydrALAZINE, HYDROmorphone (DILAUDID) injection, naloxone **AND** sodium chloride, ondansetron **OR** ondansetron (ZOFRAN) IV, phenol   Vital Signs: BP 168/101 mmHg  Pulse 121  Temp(Src) 98 F (36.7 C) (Oral)  Resp 11  Ht 5' 3"  (1.6 m)  Wt 62.143 kg (137 lb)  BMI 24.27 kg/m2  SpO2 98% SpO2: SpO2: 98 % O2 Device: O2 Device: Not Delivered O2 Flow Rate:    Intake/output summary:   Intake/Output Summary (Last 24 hours) at 04/23/15 1123 Last data filed at 04/23/15 0700  Gross per 24 hour  Intake 1346.25 ml  Output    300 ml  Net 1046.25 ml   LBM:   Baseline Weight: Weight: 61.236 kg (135 lb) Most recent weight: Weight: 62.143 kg (137 lb)  Physical Exam:   General: Ill appearing, weak, cachectic, NAD   Cardiovascular: Tachycardic  Respiratory: diminished breath sounds at bases, CTA  Abdomen: moderate to severe epigastric and upper quadrant tenderness. Patient states her pain level is always at least 7-9/10    Additional Data Reviewed: Recent Labs     04/22/15  0517  04/23/15  0545  WBC  3.2*  2.7*  HGB  11.0*  10.1*  PLT  316  249  NA  139  141  BUN  27*  27*  CREATININE  0.73  0.63     Problem List:  Patient Active Problem List   Diagnosis Date Noted  . DNR (do not resuscitate) discussion 04/22/2015  . Epigastric pain   . Palliative care encounter   . Hyponatremia 04/19/2015  . Nausea & vomiting 03/20/2015  . Antineoplastic chemotherapy induced anemia 03/20/2015  . Hematuria   . SBO (small bowel  obstruction) 01/09/2015  . Dehydration   . Abdominal pain 12/05/2014  . Acute diarrhea 12/05/2014  . Neoplasm related pain 09/04/2014  . 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 Long term current use of anticoagulant therapy 09/04/2014  . Nausea with vomiting 09/04/2014  . Constipation 09/04/2014  . Financial difficulties 09/04/2014  . Gastric cancer 06/06/2014  . Anticoagulated on Coumadin 05/23/2014  . Depression 05/17/2014  . Pulmonary nodules/lesions, multiple 05/17/2014  . PE (pulmonary embolism) 05/17/2014     Palliative Care Assessment & Plan  Gastric cancer Bowel obstruction  Code Status:  DNR DNI  Goals of Care: Comfort care Hospice consult   - Symptom Management:  Pain: Dilaudid PCA, basal rate and bolus rates increased.   Obstruction: Ccontinue Decadron 4 mg IV  bid                          Continue Reglan 5 mg 3 times per day.    Prognosis:   1-2 weeks Discharge Planning: hospice consult.    Thank you for allowing the Palliative Medicine Team to assist in the care of this patient.   Time In: 1030 Time Out: 1130 Total Time 60 Prolonged Time Billed  no     Greater than 50%  of this time was spent counseling and coordinating care related to the above assessment and plan.   Loistine Chance, MD  04/23/2015, 11:23 AM  Please contact Palliative Medicine Team phone at [phone] for questions and concerns.  [phone]

## 2015-04-23 NOTE — Progress Notes (Signed)
ANTICOAGULATION CONSULT NOTE - Follow Up Consult  Pharmacy Consult for lovenox when INR <2 Indication: hx PE  Allergies  Allergen Reactions  . Xarelto [Rivaroxaban] Nausea And Vomiting, Palpitations and Other (See Comments)    Headaches and fatigue, numbness in hands/feet and vomitting    Patient Measurements: Height: 5\' 3"  (160 cm) Weight: 137 lb (62.143 kg) IBW/kg (Calculated) : 52.4  Vital Signs: Temp: 98.2 F (36.8 C) (09/07 0530) Temp Source: Oral (09/07 0530) BP: 143/99 mmHg (09/07 0530) Pulse Rate: 121 (09/07 0530)  Labs:  Recent Labs  04/21/15 0752 04/22/15 0517 04/23/15 0545  HGB 12.3 11.0* 10.1*  HCT 37.9 35.0* 32.1*  PLT 320 316 249  LABPROT 29.7* 32.9* 31.3*  INR 2.89* 3.31* 3.09*  CREATININE 0.61 0.73 0.63    Estimated Creatinine Clearance: 65.7 mL/min (by C-G formula based on Cr of 0.63).   Assessment: Patient's a 54 y.o F with metastatic gastric cancer currently undergoing chemotherapy/radiation treatment and on coumadin PTA for hx PE.  She presented to [facility] on 9/3 with c/o SBO.  Coumadin was placed on hold d/t SBO and in case surgical intervention is needed.  To bridge with lovenox when INR <2.   - CCS recom. Palliative care for comfort measures  Home coumadin regimen: 4mg  daily except 2mg  on MWF (per [facility] clinic note on 9/1)   Today, 04/23/2015:  INR supratherapeutic 3.09  Hgb down to 10.1, platelets 249K  No bleeding documented  No PO meal intake documented  Goal of Therapy:  INR 2-3   Plan:  - continue hold off on starting lovenox until INR < 2 - follow CBC, INR daily  Hershal Coria, PharmD, BCPS Pager: [phone] 04/23/2015 7:12 AM

## 2015-04-24 ENCOUNTER — Ambulatory Visit: Payer: Self-pay | Admitting: Nurse Practitioner

## 2015-04-24 ENCOUNTER — Ambulatory Visit: Payer: Self-pay

## 2015-04-24 ENCOUNTER — Ambulatory Visit
Admit: 2015-04-24 | Discharge: 2015-04-24 | Disposition: A | Discharge status: Home / Self Care | Payer: Medicaid Other | Attending: Radiation Oncology | Admitting: Radiation Oncology

## 2015-04-24 ENCOUNTER — Encounter: Payer: Self-pay | Admitting: *Deleted

## 2015-04-24 ENCOUNTER — Ambulatory Visit
Admission: RE | Admit: 2015-04-24 | Discharge: 2015-04-24 | Disposition: A | Discharge status: Home / Self Care | Payer: Medicaid Other | Source: Ambulatory Visit | Attending: Radiation Oncology | Admitting: Radiation Oncology

## 2015-04-24 ENCOUNTER — Other Ambulatory Visit: Payer: Self-pay

## 2015-04-24 DIAGNOSIS — R1013 Epigastric pain: Secondary | ICD-10-CM

## 2015-04-24 DIAGNOSIS — R109 Unspecified abdominal pain: Secondary | ICD-10-CM

## 2015-04-24 DIAGNOSIS — C169 Malignant neoplasm of stomach, unspecified: Secondary | ICD-10-CM

## 2015-04-24 LAB — CULTURE, BLOOD (ROUTINE X 2)
CULTURE: NO GROWTH
CULTURE: NO GROWTH

## 2015-04-24 MED ORDER — ACETAMINOPHEN 650 MG RE SUPP: 650.0000 mg | Freq: Four times a day (QID) | RECTAL | Status: AC | PRN

## 2015-04-24 MED ORDER — SODIUM CHLORIDE 0.9 % IV SOLN: 2.0000 mg/h | INTRAVENOUS | Status: DC

## 2015-04-24 MED ORDER — SODIUM CHLORIDE 0.9 % IV SOLN
2.0000 mg/h | INTRAVENOUS | Status: DC
  Administered 2015-04-24: 3 mg/h via INTRAVENOUS
  Administered 2015-04-24: 2 mg/h via INTRAVENOUS
  Administered 2015-04-24: 3 mg/h via INTRAVENOUS
  Filled 2015-04-24 (×2): qty 5

## 2015-04-24 MED ORDER — SODIUM CHLORIDE 0.9 % IV SOLN: 2.0000 mg/h | INTRAVENOUS | Status: AC

## 2015-04-24 MED ORDER — METOCLOPRAMIDE HCL 5 MG/ML IJ SOLN: 5.0000 mg | Freq: Three times a day (TID) | INTRAMUSCULAR | Status: DC

## 2015-04-24 MED ORDER — HYDROMORPHONE HCL 1 MG/ML IJ SOLN
1.0000 mg | Freq: Once | INTRAMUSCULAR | Status: AC
  Administered 2015-04-24: 1 mg via INTRAVENOUS

## 2015-04-24 NOTE — Progress Notes (Signed)
5FU pump d/c by Gretchen Short RN.  PAC flushed and deaccessed and then re-accessed by Gretchen Short RN.  Patient tolerated procedure well.

## 2015-04-24 NOTE — Care Management Note (Signed)
Case Management Note  Patient Details  Name: Alicia [name] MRN: [id number] Date of Birth: 16-Jan-1961  Subjective/Objective: [facility] liason Lattie Haw will set up home visit in am for admission.She is calling her office to get permission for consents to be signed prior patient d/c tonight since home visit will be made in am due to staffing.[facility] IV liason Pam will come to hospital to patient's rm around 4-4:30p to start teaching to family who is in rm already. Teaching of CADD pump for iv dilaudid.Patient/family all agree to d/c home & are comfortable with [facility] to make home visit in am.MD aware of d/c plan & agree as long as patient can contact [facility] during the night if there is a problem.  Spoke to [employer] who says the patient will be able to contact [facility] during the night if there is a problem.  [facility] dme to deliver dme to home prior d/c, patient to d/c home via ambulance once all dme in home-CSW already following.                   Action/Plan:d/c plan home w/hospice/DME   Expected Discharge Date:  04/19/15               Expected Discharge Plan:  Home w Hospice Care  In-House Referral:  Interpreting Services  Discharge planning Services  CM Consult  Post Acute Care Choice:  Hospice ([facility] was evaluating in the community) Choice offered to:  Patient  DME Arranged:  Hospice Equipment Package D DME Agency:     HH Arranged:  RN [place] Agency:  [facility]osp[facility] and [place]  Status of Service:  Completed, signed off  Medicare Important Message Given:    Date Medicare IM Given:    Medicare IM give by:    Date Additional Medicare IM Given:    Additional Medicare Important Message give by:     If discussed at [place] of Stay Meetings, dates discussed:    Additional Comments:  Dessa Phi, RN 04/24/2015, 2:52 PM

## 2015-04-24 NOTE — Progress Notes (Signed)
Notified by Juliann Pulse [facility] of pt./family request for [facility] services at home after discharge.  Writer spoke with pt., pt's husband and pt's nephew at the bedside to initiate education related to hospice philosophy, services and team approach to care. [place] interpreter used.Family voiced understanding of information provided. Pt. Has a Dilaudid drip infusing to her right port a cath. Writer explained that [facility] will come later today to change that to a portable pump to go home with her. Per discussion plan is for discharge to home by [facility] today,  Please send signed and completed DNR form home with pt./family. Pt. will need prescriptions for discharge comfort medications.  DME needs discussed and family requests a bed, table, bsc, rolling walker, transport chair, tub seat with a back and O2 for use prn.  [facility] equipment manager Jewel Ysidro Evert notified and will contact [place] to arrange delivery to the home today. The home address has been verified and Constance Holster (dtr) is the family member to be called to arrange time of delivery.  [facility] aware of above. Please notify [facility] when pt. Is ready to leave unit at discharge-call [phone] (or [phone] after 5p ). [facility] information and contact numbers have been given to pt.s husband during visit. Above information shared with Dessa Phi, CMRN. Please call with any questions.  [facility] Liaison [phone]

## 2015-04-24 NOTE — Discharge Summary (Addendum)
Physician Discharge Summary  Alicia [name] YNW:[id number] DOB: 1960-12-15 DOA: 04/19/2015  PCP: Minerva Ends, MD  Admit date: 04/19/2015 Discharge date: 04/24/2015  Time spent: 35 minutes  Recommendations for Outpatient Follow-up:  1. Discharge home with home hospice for full comfort. 2. Prognosis is extremely guarded.  Discharge Diagnoses:  Principal Problem:   Recurrent SBO (small bowel obstruction)   Active Problems:   PE (pulmonary embolism)   Anticoagulated on Coumadin   Gastric cancer   Dehydration   Hyponatremia   Epigastric pain   Palliative care encounter     CODE STATUS: DO NOT RESUSCITATE  Discharge Condition: Extremely guarded  Diet recommendation: Ice chips as tolerated  Filed Weights   04/19/15 1551 04/19/15 2145  Weight: 61.236 kg (135 lb) 62.143 kg (137 lb)    History of present illness:  54 year old Hispanic female with metastatic gastric cancer undergoing chemotherapy with 5-FU and radiation, history of PE, 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 Patient seen by surgery and given poor oral prognosis palliative care team consulted with goal for comfort and plan on home hospice.  Hospital Course:  High-grade small bowel obstruction Secondary to metastatic gastric cancer. High tumor burden with poor overall prognosis . Not a surgical candidate. On Dilaudid drip with interval boluses. Palliative care team met with family on 9/7 with goal on full comfort and recommended home versus residential hospice. Family interested in home hospice. -Patient and family agree on full comfort measures and no further intervention or treatment. 5-FU pump discontinued this morning. -Patient will be discharged home with home hospice on Dilaudid infusion with intermittent boluses through her Port-A-Cath. Discontinued Decadron by oncology. -home hospice unable to give scheuled IV  reglan . Pt cannot tolerate po.  -Prognosis extremely guarded (assumed to be <2 weeks). -  History of pulmonary embolism Patient on Coumadin at home. Patient currently nothing by mouth. Plan on full comfort.  Metastatic gastric cancer Guarded prognosis. 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 No intervention planned.  Dehydration with hyponatremia Continue IV fluids  Functional cardioplegia  Severe protein calorie malnutrition    Family Communication: Husband and nephew at bedside  Disposition Plan: Home with hospice    Consultants:  Surgery  Palliative care  Procedures:  None  Antibiotics:  None  Discharge Exam: Filed Vitals:   04/24/15 0600  BP: 150/95  Pulse: 119  Temp: 97.6 F (36.4 C)  Resp: 17     General: Middle aged female lying in bed not in distress, appears fatigued  HEENT: Pallor present, dry oral mucosa  Chest: Right-sided Port-A-Cath, clear to auscultation bilaterally  CVS: S1 and S2 tachycardic, no murmurs rub or gallop  GI: Mild distention, diffuse tenderness, absent bowel sounds  Musculoskeletal: Warm, no edema  CNS: Alert and oriented  Discharge Instructions    Current Discharge Medication List    START taking these medications   Details  acetaminophen (TYLENOL) 650 MG suppository Place 1 suppository (650 mg total) rectally every 6 (six) hours as needed for mild pain (or Fever >/= 101). Qty: 12 suppository, Refills: 0    HYDROmorphone 50 mg in sodium chloride 0.9 % 95 mL Inject 2 mg/hr into the vein continuous. Qty: 30 mL, Refills: 0   Associated Diagnoses: SBO (small bowel obstruction); Gastric cancer           STOP taking these medications  Alum & Mag Hydroxide-Simeth (MAGIC MOUTHWASH) SOLN      amLODipine (NORVASC) 5 MG tablet      diphenoxylate-atropine (LOMOTIL) 2.5-0.025 MG per tablet       HYDROmorphone (DILAUDID) 4 MG tablet      pantoprazole (PROTONIX) 40 MG tablet      potassium chloride SA (K-DUR,KLOR-CON) 20 MEQ tablet      prochlorperazine (COMPAZINE) 10 MG tablet      sertraline (ZOLOFT) 100 MG tablet      simethicone (MYLICON) 660 MG chewable tablet      warfarin (COUMADIN) 2 MG tablet      warfarin (COUMADIN) 4 MG tablet      ondansetron (ZOFRAN) 4 MG tablet        Allergies  Allergen Reactions  . Xarelto [Rivaroxaban] Nausea And Vomiting, Palpitations and Other (See Comments)    Headaches and fatigue, numbness in hands/feet and vomitting   Follow-up Information    Please follow up.   Why:  home hospice       The results of significant diagnostics from this hospitalization (including imaging, microbiology, ancillary and laboratory) are listed below for reference.    Significant Diagnostic Studies: Ct Abdomen Pelvis W Contrast  04/19/2015   CLINICAL DATA:  Abdominal pain, nausea vomiting and diarrhea for 3 days. The patient with stage IV gastric cancer.  EXAM: CT ABDOMEN AND PELVIS WITH CONTRAST  TECHNIQUE: Multidetector CT imaging of the abdomen and pelvis was performed using the standard protocol following bolus administration of intravenous contrast.  CONTRAST:  152m OMNIPAQUE IOHEXOL 300 MG/ML  SOLN  COMPARISON:  03/20/2015 and prior CTs  FINDINGS: Lower chest:  A new small left pleural effusion is identified.  Hepatobiliary: The liver is unchanged. The gallbladder is mildly distended. There is mild periportal edema.  Pancreas: Unremarkable  Spleen: Unremarkable  Adrenals/Urinary Tract: Moderate right hydroureteronephrosis is unchanged with transition point near surgical clips in the mid right pelvis. The left kidney is unremarkable. The adrenal glands are unremarkable. Bladder is collapsed.  Stomach/Bowel: Dilated proximal and mid small bowel loops are identified with collapsed distal small bowel loops, compatible with high-grade small bowel  obstruction. The transition point appears to lie within the anterior upper pelvis.  An annular constricting gastric mass is again identified and compatible with known gastric cancer. Peritoneal and omental metastatic disease are again noted and not significantly changed. There is no evidence of abscess or pneumoperitoneum.  Vascular/Lymphatic: Prominent abdominal and retroperitoneal lymph nodes are unchanged. There is no evidence of abdominal aortic aneurysm.  Reproductive: Patient is status post hysterectomy.  Other: Increasing moderate ascites within the abdomen noted.  Musculoskeletal: Scattered sclerotic lesions within the visualized bones are unchanged.  IMPRESSION: High-grade small bowel obstruction with transition point appearing to lie within the anterior upper pelvis. No evidence of pneumoperitoneum.  Increasing moderate ascites and new small left pleural effusion.  Gastric mass and metastatic disease again noted.  Unchanged moderate right hydroureteronephrosis with transition point in the mid right pelvis.   Electronically Signed   By: JMargarette CanadaM.D.   On: 04/19/2015 18:24   [place]Abdomen Limited  04/18/2015   CLINICAL DATA:  Gastric malignancy, assess for ascites.  EXAM: LIMITED ABDOMEN ULTRASOUND FOR ASCITES  TECHNIQUE: Limited ultrasound survey for ascites was performed in all four abdominal quadrants.  COMPARISON:  Abdominal and pelvic CT scan dated March 20, 2015  FINDINGS: There is a small amount of u[place]oreaascitic fluid in the right upper and lower quadrant and in the left  lower quadrant. A trace fluid is present in the left upper quadrant. The volume present is felt to be insufficient for paracentesis.  IMPRESSION: Small volume of ascites throughout the abdomen.   Electronically Signed   By: David  Martinique M.D.   On: 04/18/2015 10:56   Dg Abd 2 Views  04/22/2015   CLINICAL DATA:  Metastatic gastric cancer, undergoing chemo or radiation therapy. Small bowel obstruction.  EXAM: ABDOMEN - 2 VIEW   COMPARISON:  CT abdomen/pelvis from 04/19/2015.  FINDINGS: There persistent moderately to markedly dilated small bowel loops throughout the abdomen up to 5.6 cm with associated air-fluid levels, in keeping with persistent distal small bowel obstruction, which appears slightly worsened compared to the recent CT study. No evidence of pneumatosis or pneumoperitoneum. Surgical clips are noted in the bilateral pelvis. Stable nonspecific sclerotic lesion in the supra-acetabular right iliac bone.  IMPRESSION: 1. Persistent distal small bowel obstruction with slight interval worsening. No evidence of pneumoperitoneum. 2. Stable nonspecific supra-acetabular right iliac bone sclerotic lesion, metastasis not excluded.   Electronically Signed   By: Ilona Sorrel M.D.   On: 04/22/2015 15:09    Microbiology: Recent Results (from the past 240 hour(s))  Culture, blood (routine x 2)     Status: None (Preliminary result)   Collection Time: 04/19/15  4:10 PM  Result Value Ref Range Status   Specimen Description BLOOD LEFT ANTECUBITAL  Final   Special Requests BOTTLES DRAWN AEROBIC AND ANAEROBIC 5CC EACH  Final   Culture   Final    NO GROWTH 4 DAYS Performed at [facility]    Report Status PENDING  Incomplete  Urine culture     Status: None   Collection Time: 04/19/15  5:15 PM  Result Value Ref Range Status   Specimen Description URINE, CLEAN CATCH  Final   Special Requests NONE  Final   Culture   Final    MULTIPLE SPECIES PRESENT, SUGGEST RECOLLECTION Performed at [facility]    Report Status 04/21/2015 FINAL  Final  Culture, blood (routine x 2)     Status: None (Preliminary result)   Collection Time: 04/19/15  9:52 PM  Result Value Ref Range Status   Specimen Description BLOOD RIGHT HAND  Final   Special Requests BOTTLES DRAWN AEROBIC ONLY 11CC  Final   Culture   Final    NO GROWTH 4 DAYS Performed at [facility]    Report Status PENDING  Incomplete     Labs: Basic  Metabolic Panel:  Recent Labs Lab 04/19/15 1615 04/20/15 0521 04/21/15 0752 04/22/15 0517 04/23/15 0545  NA 131* 132* 135 139 141  K 4.6 3.7 4.0 3.9 3.9  CL 96* 98* 103 106 108  CO2 26 25 22 24 23   GLUCOSE 118* 101* 108* 109* 102*  BUN 21* 16 21* 27* 27*  CREATININE 0.60 0.55 0.61 0.73 0.63  CALCIUM 9.1 8.5* 8.7* 9.0 9.0  MG  --  1.9  --   --   --   PHOS  --  3.4  --   --   --    Liver Function Tests:  Recent Labs Lab 04/19/15 1615 04/20/15 0521 04/21/15 0752 04/22/15 0517  AST 28 22 23 21   ALT 19 17 18 17   ALKPHOS 395* 324* 304* 271*  BILITOT 1.0 1.3* 1.1 1.4*  PROT 6.8 5.9* 6.2* 6.3*  ALBUMIN 3.4* 2.9* 3.1* 3.0*   No results for input(s): LIPASE, AMYLASE in the last 168 hours. No results for input(s):  AMMONIA in the last 168 hours. CBC:  Recent Labs Lab 04/19/15 1615 04/20/15 0521 04/21/15 0752 04/22/15 0517 04/23/15 0545  WBC 5.6 3.9* 2.9* 3.2* 2.7*  NEUTROABS 4.4  --   --   --   --   HGB 12.3 11.2* 12.3 11.0* 10.1*  HCT 36.8 35.2* 37.9 35.0* 32.1*  MCV 89.5 90.7 92.2 94.1 94.4  PLT 312 254 320 316 249   Cardiac Enzymes: No results for input(s): CKTOTAL, CKMB, CKMBINDEX, TROPONINI in the last 168 hours. BNP: BNP (last 3 results) No results for input(s): BNP in the last 8760 hours.  ProBNP (last 3 results) No results for input(s): PROBNP in the last 8760 hours.  CBG: No results for input(s): GLUCAP in the last 168 hours.     SignedLouellen Molder  [facility] 04/24/2015, 12:07 PM

## 2015-04-24 NOTE — Progress Notes (Signed)
CSW consulted for transportation needs. Patient will need non-emergency ambulance transport home. CSW confirmed home address with patient's family at bedside.   [facility] will be called for transport once equipment has been delivered.   No other CSW needs identified - CSW signing off.   Raynaldo Opitz, [facility] Clinical Social Worker cell #: [phone]

## 2015-04-24 NOTE — Progress Notes (Signed)
We had a long talk with Alicia [name] this morning. My PA, Alicia [name], who is incredibly fluent in [place] Alicia [name] was the interpreter. Alicia [name] did an outstanding job.  We told the patient, and Alicia [name]-who was present-that there was nothing else that can be done for Alicia [name]. Alicia [name] last abdominal film showed worsening progression of Alicia [name] small bowel obstruction.  Alicia [name] is having more abdominal pain. The PCA that Alicia [name] is on is just not going to manage this pain. I think that for now, Alicia [name] really needs to be on a Dilaudid infusion. I want Alicia [name] as comfortable as possible.  Alicia [name] still has the 5-FU pump. We will get this taken off Alicia [name]. Alicia [name] wants to have this taken off.  We talked to Alicia [name] about end-of-life care. Hospice will be imperative. They will help [place] out with Alicia [name] at home. Alicia [name] does not want to go to [facility].  Alicia [name] understands that there is no other interventions that can be offered. This is been hard on Alicia [name]. Again, Alicia [name] really, really helped [place] out and talked to the patient at length regarding what was happening with Alicia [name] and that our goal now was Alicia [name] comfort, respect and dignity.  The patient, and Alicia [name], clearly understand this now.  Alicia [name] just wants to be kept comfortable.  To try to help with the bowel obstruction, we might want to try octreotide. Sometimes this can help decrease bowel secretions and can allow for some improvement in bowel blockage.  I don't think Alicia [name] really needs Decadron.  At this point, is all about making sure that Alicia [name] has comfort.  I suspect Alicia [name] prognosis is going to be less than 2 weeks. There is no role for parenteral nutrition.  On Alicia [name] physical exam, Alicia [name] blood pressures on the higher side. It is 150/95. Alicia [name] pulse is 119. Alicia [name] is afebrile. Alicia [name] abdomen is distended. Alicia [name] has some high-pitched bowel sounds. There is some guarding. Alicia [name] has some tenderness to palpation. Cardiac exam is tachycardic but regular. Extremities shows some trace edema in Alicia [name] legs.  This is just a very sad  situation. Alicia [name] regular doctor, Dr. Benay Spice, is out for 2 weeks. I'm sure that he wishes he were here to be able to help Alicia [name].  Again, I cannot emphasize enough how important Alicia [name] my PA was in effectively communicating with Alicia [name] and Alicia [name] Alicia [name] current status and our future goals for Alicia [name].  We reiterated and Alicia [name] reinforced the fact that Alicia [name] does not want be kept alive by any heroic measures. As such, Alicia [name] is DO NOT RESUSCITATE.  We spent over 30 minutes with Alicia [name] and Alicia [name] this morning. It was important that we be able to talk with Alicia [name] and to allow Alicia [name] to tell us what Alicia [name] felt was important for the end-of-life care that Alicia [name] will need.  [name]  2 Timothy 4:17-18

## 2015-04-24 NOTE — Care Management Note (Signed)
Case Management Note  Patient Details  Name: Alicia [name] MRN: [id number] Date of Birth: [birth year]  Subjective/Objective:Received referral for home hospice services.  Interpreter service used:in rm w/patient/nephew-Alicia [name], & other relatives.Per patient permission-spoke using interpreter w/family in rm about home hospice services(explained disciplines,equipment)Patient chose [facility], which she was using prior to admission for an eval(they were already following).[facility], spoke to [place] who will contact liason to call me back.PCP to follow:Dr. Boykin Nearing, Currently on dilaudid iv PCA,Has PAC,dme-hospital bed,overlay gel mattress,overbed table, 02,w/c,3n1,rw.Contact person:dtr-Norma c#[phone]. Ambulance transp @ d/c-CSW notified. DME to be delivered to home prior d/c.                   Action/Plan:Await call back from [facility] on eval, & then if they can accept case.d/c plan home w/hospice-iv dilaudid/dme.   Expected Discharge Date:  04/19/15               Expected Discharge Plan:  Home w Hospice Care  In-House Referral:  Interpreting Services  Discharge planning Services  CM Consult  Post Acute Care Choice:  Hospice ([facility] was evaluating in the community) Choice offered to:     DME Arranged:    DME Agency:     HH Arranged:  RN [place] Agency:     Status of Service:  In process, will continue to follow  Medicare Important Message Given:    Date Medicare IM Given:    Medicare IM give by:    Date Additional Medicare IM Given:    Additional Medicare Important Message give by:     If discussed at [place] of Stay Meetings, dates discussed:    Additional Comments:  Dessa Phi, RN 04/24/2015, 12:10 PM

## 2015-04-24 NOTE — Progress Notes (Signed)
32fu pump d/c while patient is an in-patient today at 1020am.  PAC flushed and de-accessed and re-accessed and documented.

## 2015-04-25 ENCOUNTER — Ambulatory Visit: Payer: Medicaid Other

## 2015-04-25 ENCOUNTER — Ambulatory Visit: Payer: Medicaid Other | Admitting: Radiation Oncology

## 2015-04-28 ENCOUNTER — Ambulatory Visit: Payer: Medicaid Other

## 2015-04-29 ENCOUNTER — Ambulatory Visit: Payer: Medicaid Other

## 2015-04-30 ENCOUNTER — Ambulatory Visit: Payer: Medicaid Other

## 2015-05-01 ENCOUNTER — Ambulatory Visit: Payer: Medicaid Other

## 2015-05-01 ENCOUNTER — Other Ambulatory Visit: Payer: Self-pay

## 2015-05-01 ENCOUNTER — Ambulatory Visit: Payer: Self-pay | Admitting: Nurse Practitioner

## 2015-05-05 ENCOUNTER — Telehealth: Payer: Self-pay | Admitting: Oncology

## 2015-05-05 NOTE — Telephone Encounter (Signed)
Received death certificate 05/05/15 °

## 2015-05-14 ENCOUNTER — Ambulatory Visit: Payer: Self-pay | Admitting: Oncology

## 2015-05-14 ENCOUNTER — Other Ambulatory Visit: Payer: Self-pay

## 2015-05-17 DEATH — deceased

## 2015-07-04 IMAGING — CR DG ABDOMEN 2V
2 series · 2 of 2 positions shown · non-contrast
Comparison: 01/09/2015

CLINICAL DATA: Patient with 1 month of abdominal pain.

EXAM:
ABDOMEN - 2 VIEW

[w abdomen upright *]
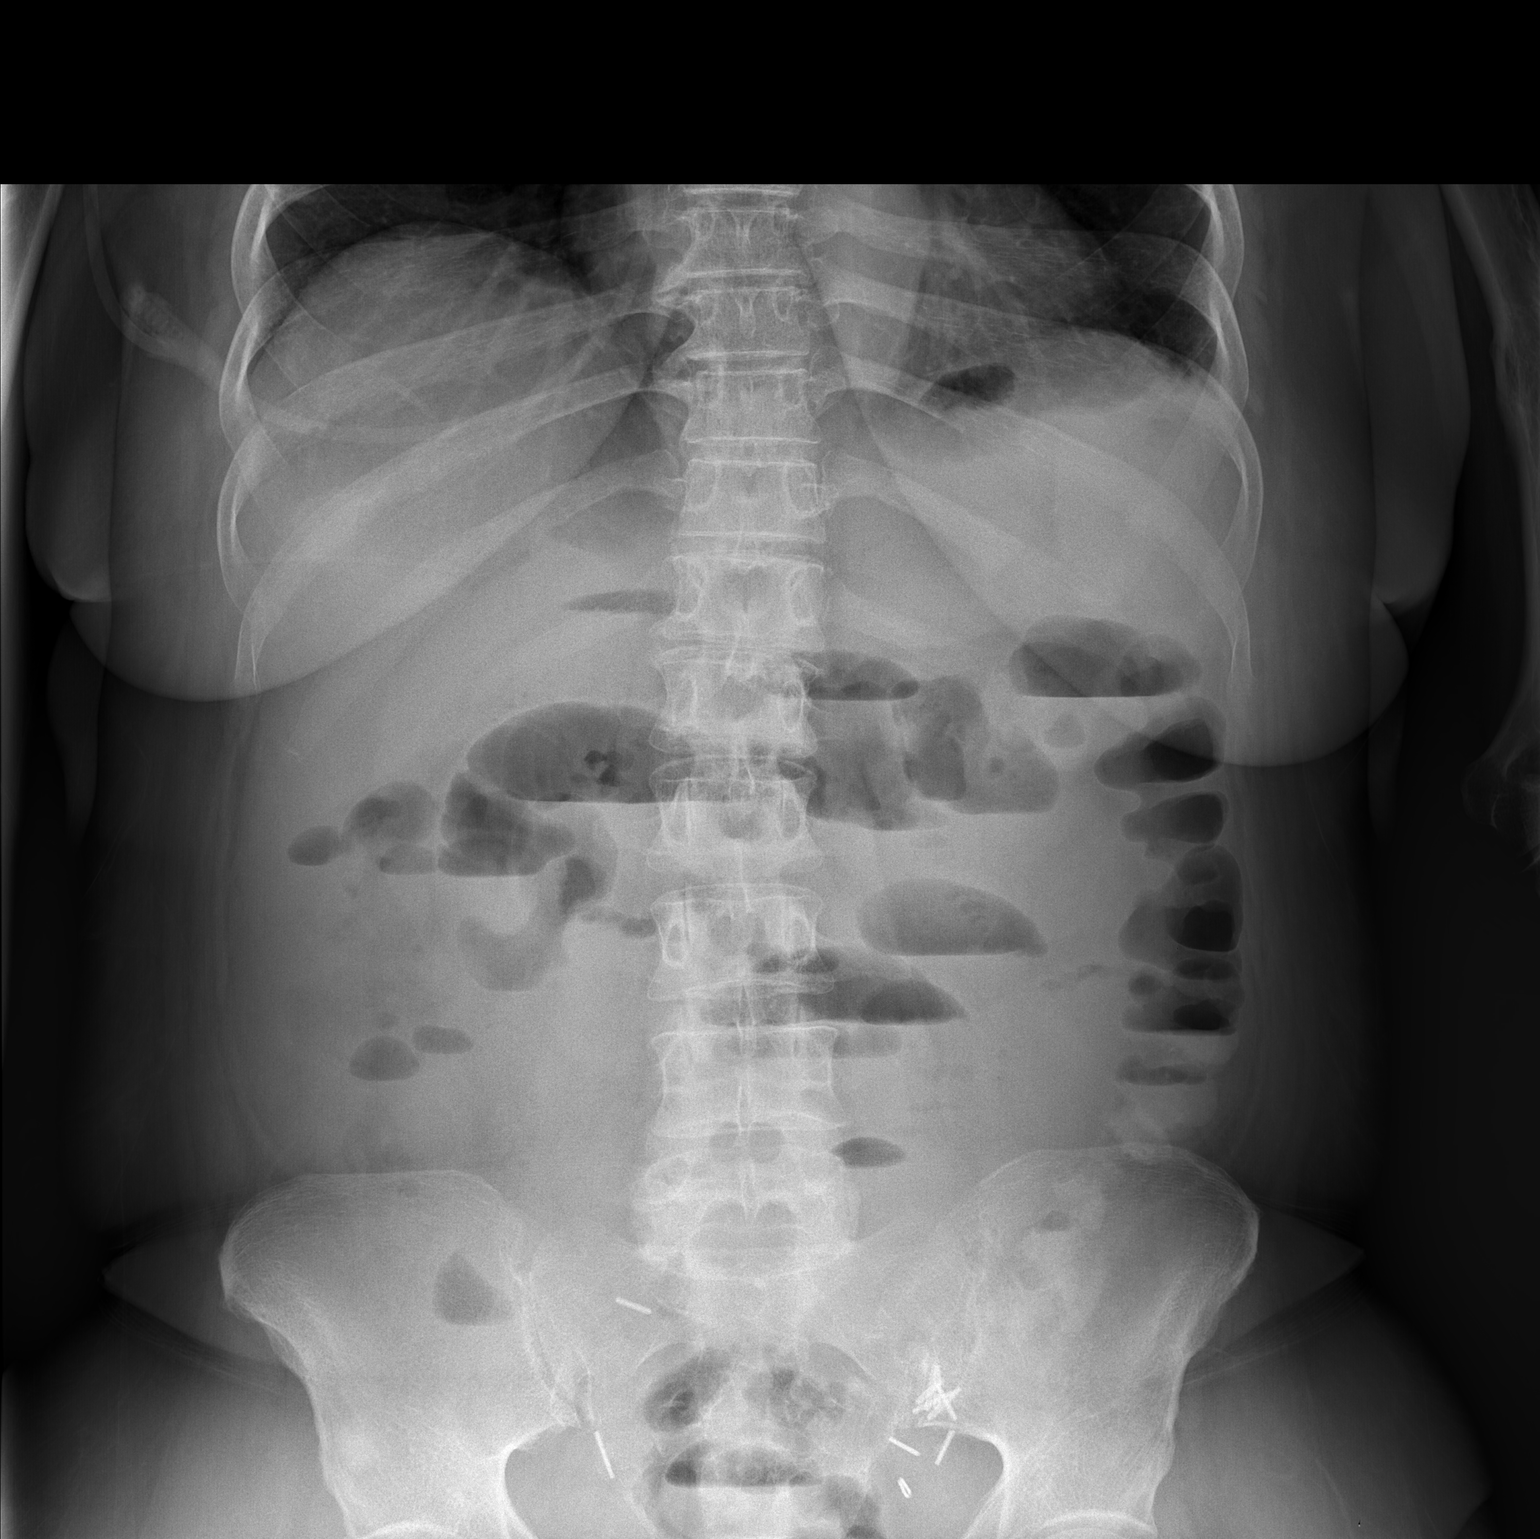

[t abdomen supine]
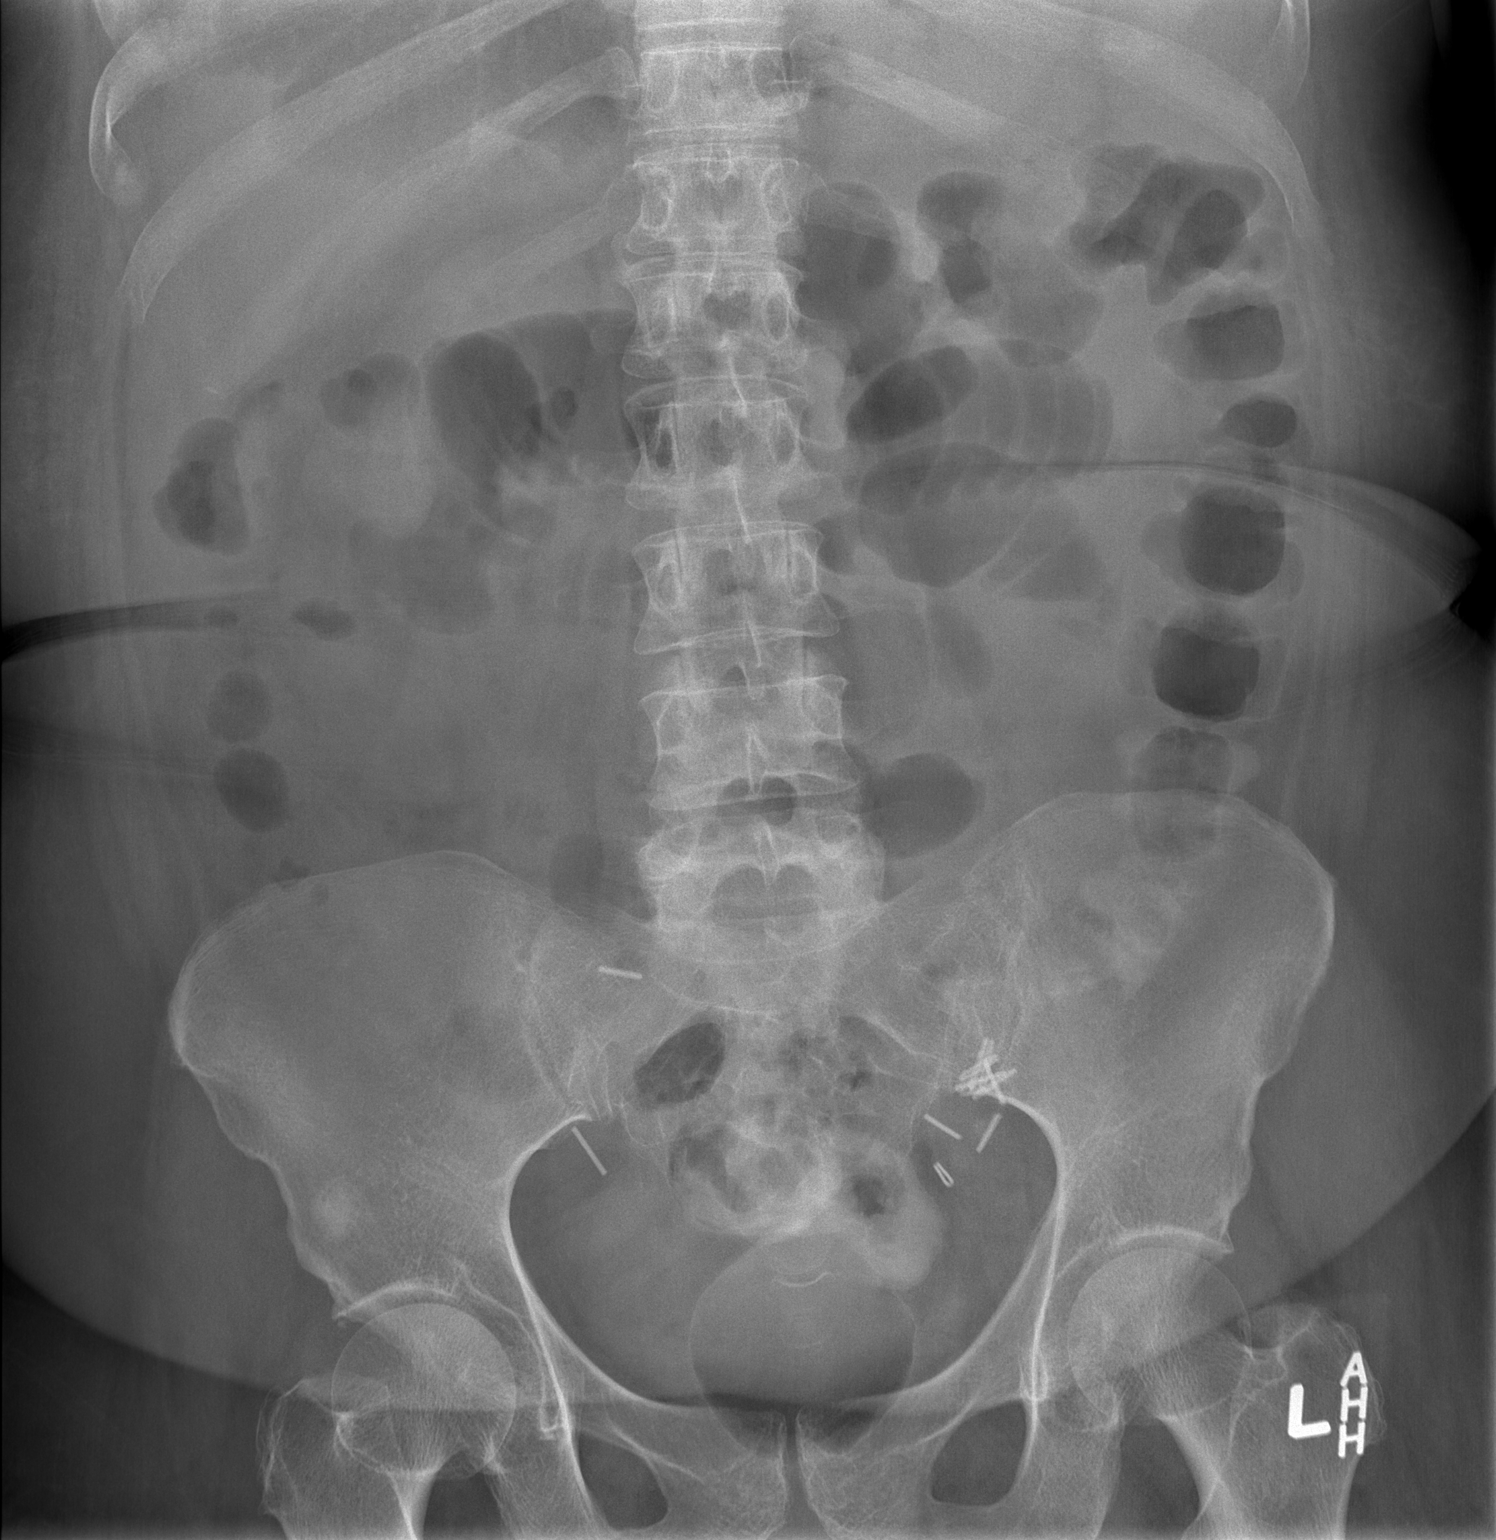

[2 of 2 positions shown; findings below may reference images not displayed]

FINDINGS: Multiple gaseous distended loops of small bowel are demonstrated
within the central abdomen measuring up to 4 cm. Differential
air-fluid levels are demonstrated on the upright imaging. No free
intraperitoneal air. Minimal atelectasis left lung base. Pelvic
surgical clips. Regional skeleton unremarkable. Contrast material in
the distal colon.
IMPRESSION: Differential air-fluid levels on upright imaging most compatible
with small bowel obstruction. Contrast is demonstrated within the
distal colon therefore this may represent a partial obstruction.

## 2015-07-05 IMAGING — DX DG ABD PORTABLE 1V
1 series · 1 of 1 positions shown · non-contrast
Comparison: 01/10/2015

CLINICAL DATA: Followup small bowel obstruction.

Hx of adenocarcinoma small intestine (dx 10/18/2013), abdominal
hysterectomy (10/18/2013).Right marker was accidentally collimated off
bottom right corner. Left (L) marker was annotated on portable
EXAM:
PORTABLE ABDOMEN - 1 VIEW

[abdomen kub]
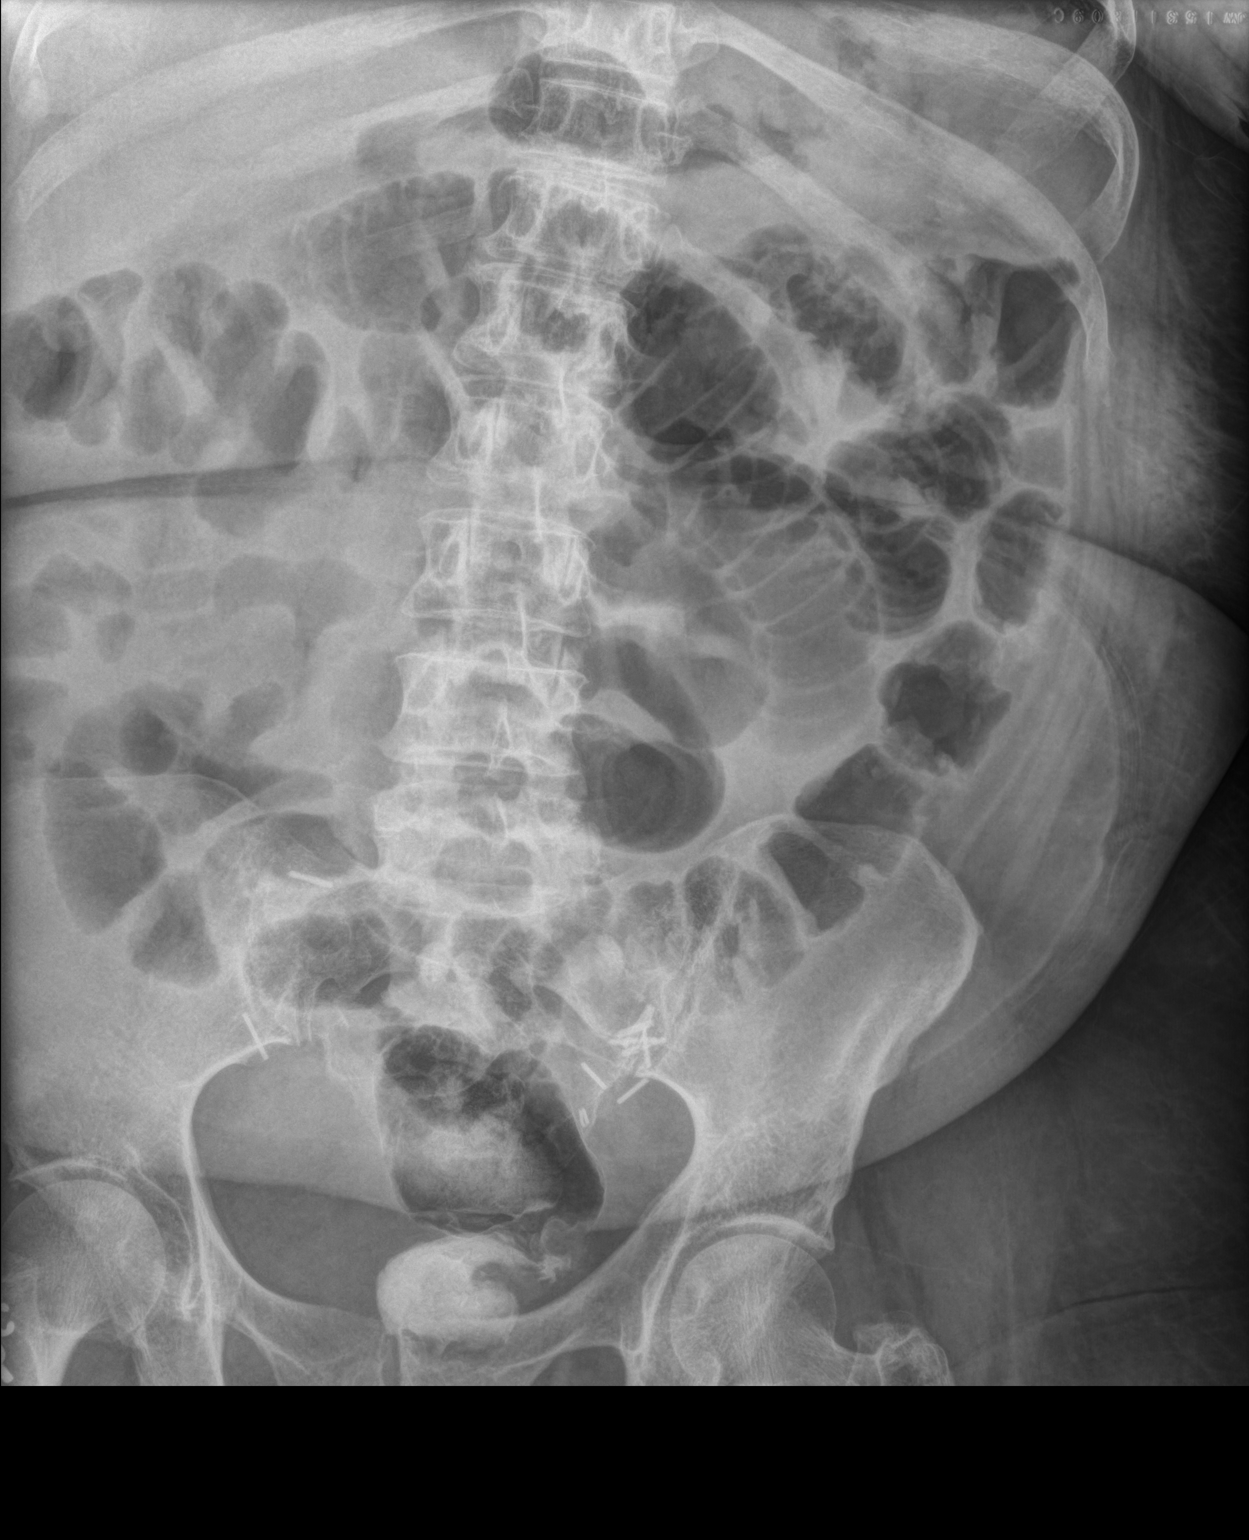

[1 of 1 positions shown; findings below may reference images not displayed]

FINDINGS: Mildly dilated central small bowel appreciated, similar to the
previous day's study. Air is seen within a normal caliber colon and
in the rectum.

There surgical clips the pelvis, stable. Soft tissues otherwise
unremarkable.
IMPRESSION: Persistent partial small bowel obstruction. No significant change
from the previous day's study.

## 2015-08-08 NOTE — Progress Notes (Signed)
I am closing these encounters as I am responsible for the [facility] now and the provider at this time is no longer employed at our facility to close them. I did not provide this service on this date.   Cordarryl Monrreal Karl, PharmD, BCPS, CPP Clinical Pharmacist Practitioner  [facility] [phone]  

## 2015-08-08 NOTE — Progress Notes (Signed)
I am closing these encounters as I am responsible for the [facility] now and the provider at this time is no longer employed at our facility to close them. I did not provide this service on this date.   Dilon Lank Karl, PharmD, BCPS, CPP Clinical Pharmacist Practitioner  [facility] [phone]  

## 2015-08-14 ENCOUNTER — Other Ambulatory Visit: Payer: Self-pay | Admitting: Nurse Practitioner
